# Patient Record
Sex: Female | Born: 1940 | Race: White | Hispanic: No | Smoking: Former smoker
Health system: Southern US, Community
[De-identification: ages and names within clinical notes are randomized; demographics above are authoritative.]

## PROBLEM LIST (undated history)

## (undated) DIAGNOSIS — F32A Depression, unspecified: Secondary | ICD-10-CM

## (undated) DIAGNOSIS — E119 Type 2 diabetes mellitus without complications: Secondary | ICD-10-CM

## (undated) DIAGNOSIS — K219 Gastro-esophageal reflux disease without esophagitis: Secondary | ICD-10-CM

## (undated) DIAGNOSIS — C189 Malignant neoplasm of colon, unspecified: Secondary | ICD-10-CM

## (undated) DIAGNOSIS — I10 Essential (primary) hypertension: Secondary | ICD-10-CM

## (undated) DIAGNOSIS — F329 Major depressive disorder, single episode, unspecified: Secondary | ICD-10-CM

## (undated) HISTORY — PX: ABDOMINAL HYSTERECTOMY: SHX81

## (undated) HISTORY — PX: CHOLECYSTECTOMY: SHX55

---

## 2005-09-21 ENCOUNTER — Emergency Department: Payer: Self-pay | Admitting: Emergency Medicine

## 2006-11-07 ENCOUNTER — Emergency Department (HOSPITAL_COMMUNITY): Admission: EM | Admit: 2006-11-07 | Discharge: 2006-11-07 | Payer: Self-pay | Admitting: Emergency Medicine

## 2006-12-24 ENCOUNTER — Emergency Department (HOSPITAL_COMMUNITY): Admission: EM | Admit: 2006-12-24 | Discharge: 2006-12-24 | Payer: Self-pay | Admitting: Emergency Medicine

## 2006-12-28 ENCOUNTER — Emergency Department: Payer: Self-pay

## 2009-05-20 ENCOUNTER — Emergency Department (HOSPITAL_COMMUNITY): Admission: EM | Admit: 2009-05-20 | Discharge: 2009-05-20 | Payer: Self-pay | Admitting: Emergency Medicine

## 2010-12-13 ENCOUNTER — Emergency Department (HOSPITAL_COMMUNITY)
Admission: EM | Admit: 2010-12-13 | Discharge: 2010-12-14 | Disposition: A | Discharge status: Home / Self Care | Payer: Self-pay | Attending: Emergency Medicine | Admitting: Emergency Medicine

## 2010-12-13 DIAGNOSIS — T07XXXA Unspecified multiple injuries, initial encounter: Secondary | ICD-10-CM | POA: Insufficient documentation

## 2010-12-13 DIAGNOSIS — M79609 Pain in unspecified limb: Secondary | ICD-10-CM | POA: Insufficient documentation

## 2010-12-13 DIAGNOSIS — I10 Essential (primary) hypertension: Secondary | ICD-10-CM | POA: Insufficient documentation

## 2010-12-13 DIAGNOSIS — E119 Type 2 diabetes mellitus without complications: Secondary | ICD-10-CM | POA: Insufficient documentation

## 2010-12-13 DIAGNOSIS — M25559 Pain in unspecified hip: Secondary | ICD-10-CM | POA: Insufficient documentation

## 2010-12-13 DIAGNOSIS — E669 Obesity, unspecified: Secondary | ICD-10-CM | POA: Insufficient documentation

## 2010-12-13 DIAGNOSIS — Y929 Unspecified place or not applicable: Secondary | ICD-10-CM | POA: Insufficient documentation

## 2012-09-21 ENCOUNTER — Emergency Department (HOSPITAL_COMMUNITY)
Admission: EM | Admit: 2012-09-21 | Discharge: 2012-09-21 | Disposition: A | Discharge status: Home / Self Care | Payer: Self-pay | Attending: Emergency Medicine | Admitting: Emergency Medicine

## 2012-09-21 ENCOUNTER — Emergency Department (HOSPITAL_COMMUNITY): Payer: Self-pay

## 2012-09-21 ENCOUNTER — Encounter (HOSPITAL_COMMUNITY): Payer: Self-pay | Admitting: *Deleted

## 2012-09-21 DIAGNOSIS — L0390 Cellulitis, unspecified: Secondary | ICD-10-CM

## 2012-09-21 DIAGNOSIS — L03039 Cellulitis of unspecified toe: Secondary | ICD-10-CM | POA: Insufficient documentation

## 2012-09-21 DIAGNOSIS — E119 Type 2 diabetes mellitus without complications: Secondary | ICD-10-CM | POA: Insufficient documentation

## 2012-09-21 DIAGNOSIS — I10 Essential (primary) hypertension: Secondary | ICD-10-CM | POA: Insufficient documentation

## 2012-09-21 DIAGNOSIS — L02619 Cutaneous abscess of unspecified foot: Secondary | ICD-10-CM | POA: Insufficient documentation

## 2012-09-21 HISTORY — DX: Essential (primary) hypertension: I10

## 2012-09-21 HISTORY — DX: Type 2 diabetes mellitus without complications: E11.9

## 2012-09-21 LAB — CBC WITH DIFFERENTIAL/PLATELET
Basophils Absolute: 0 10*3/uL (ref 0.0–0.1)
Basophils Relative: 1 % (ref 0–1)
Eosinophils Absolute: 0.2 10*3/uL (ref 0.0–0.7)
Eosinophils Relative: 2 % (ref 0–5)
Hemoglobin: 14 g/dL (ref 12.0–15.0)
Lymphocytes Relative: 23 % (ref 12–46)
MCHC: 34.1 g/dL (ref 30.0–36.0)
Monocytes Absolute: 0.5 10*3/uL (ref 0.1–1.0)
Monocytes Relative: 6 % (ref 3–12)
Neutro Abs: 6 10*3/uL (ref 1.7–7.7)
RDW: 12.9 % (ref 11.5–15.5)

## 2012-09-21 LAB — POCT I-STAT, CHEM 8
Creatinine, Ser: 1 mg/dL (ref 0.50–1.10)
Glucose, Bld: 138 mg/dL — ABNORMAL HIGH (ref 70–99)
HCT: 42 % (ref 36.0–46.0)
Hemoglobin: 14.3 g/dL (ref 12.0–15.0)
TCO2: 27 mmol/L (ref 0–100)

## 2012-09-21 MED ORDER — SULFAMETHOXAZOLE-TRIMETHOPRIM 800-160 MG PO TABS: 1.0000 | ORAL_TABLET | Freq: Two times a day (BID) | ORAL | Status: DC

## 2012-09-21 MED ORDER — OXYCODONE-ACETAMINOPHEN 5-325 MG PO TABS: 1.0000 | ORAL_TABLET | Freq: Once | ORAL | Status: DC

## 2012-09-21 MED ORDER — OXYCODONE-ACETAMINOPHEN 5-325 MG PO TABS
1.0000 | ORAL_TABLET | Freq: Once | ORAL | Status: AC
Start: 2012-09-21 — End: 2012-09-21
  Administered 2012-09-21: 1 via ORAL
  Filled 2012-09-21: qty 1

## 2012-09-21 NOTE — ED Notes (Addendum)
Patient states she has a place on her foot,  She has noted callouses to her left foot.  Patient has some darkness to the great toe, states the area looked like it had a blood blister and it bled.  There is redness noted to the great toe as well.  She now has sore developing between her 3rd and 4th toes.  The 4th toe is red.    She is a diabetic but states she is not taking any medications and does not monitor her sugar

## 2012-09-21 NOTE — ED Provider Notes (Signed)
I  reviewed the resident's note and I agree with the findings and plan.      Nelia Shi, MD 09/21/12 [phone]

## 2012-09-21 NOTE — ED Provider Notes (Addendum)
History     CSN: [id number]  Arrival date & time 09/21/12  4098   None     Chief Complaint  Patient presents with  . Toe Pain    (Consider location/radiation/quality/duration/timing/severity/associated sxs/prior treatment) HPI Comments: 72 y.o PMH DM (not on medication) presents with left 4th toe pain and ulceration x 2 weeks.  Pain is 10/10 burning throbbing left 4th toe.  Pain especially hurts at night.  Pain radiates to mid left foot.  Left great toe has a callus with previous blood blister that's dried.  She has tried peroxide, Ibuprofen and placing a bandaid to remove pressure on the area without relief.  She denies trauma or insect bite.    PCP: none pending Medicaid on Medicare SH: denies smoking since 1992   Patient is a 72 y.o. female presenting with toe pain. The history is provided by the patient. No language interpreter was used.  Toe Pain This is a new problem. The current episode started 1 to 4 weeks ago. The problem occurs daily. Pertinent negatives include no chest pain, chills or fever. Exacerbated by: pressure from other toe  She has tried NSAIDs (peroxide ) for the symptoms. The treatment provided no relief.    Past Medical History  Diagnosis Date  . Diabetes mellitus without complication   . Hypertension     Past Surgical History  Procedure Date  . Cholecystectomy   . Abdominal hysterectomy     No family history on file.  History  Substance Use Topics  . Smoking status: Never Smoker   . Smokeless tobacco: Not on file  . Alcohol Use: No    OB History    Grav Para Term Preterm Abortions TAB SAB Ect Mult Living                  Review of Systems  Constitutional: Negative for fever and chills.  Respiratory: Negative for shortness of breath.   Cardiovascular: Negative for chest pain.  Skin: Positive for wound.       Callus left great toe Wound to left 4th toe x 2 week denies h/o purulent drainage but has bleeding, pain    Allergies  Review  of patient's allergies indicates no known allergies.  Home Medications   Current Outpatient Rx  Name  Route  Sig  Dispense  Refill  . IBUPROFEN 200 MG PO TABS   Oral   Take 200 mg by mouth every 6 (six) hours as needed. pain         . OXYCODONE-ACETAMINOPHEN 5-325 MG PO TABS   Oral   Take 1 tablet by mouth once.   30 tablet   0   . SULFAMETHOXAZOLE-TRIMETHOPRIM 800-160 MG PO TABS   Oral   Take 1 tablet by mouth 2 (two) times daily. For 7 days. Do not take with alcohol. ANTIBIOTIC   14 tablet   0     BP 134/67  Pulse 64  Temp 97 F (36.1 C) (Oral)  Resp 18  Ht 5\' 11"  (1.803 m)  Wt 192 lb (87.091 kg)  BMI 26.78 kg/m2  SpO2 97%  Physical Exam  Nursing note and vitals reviewed. Constitutional: She is oriented to person, place, and time. She appears well-developed and well-nourished. She is cooperative. No distress.  HENT:  Head: Normocephalic and atraumatic.  Mouth/Throat: Oropharynx is clear and moist and mucous membranes are normal. She has dentures. No oropharyngeal exudate.  Eyes: Conjunctivae normal are normal. Pupils are equal, round, and reactive to light.  Right eye exhibits no discharge. Left eye exhibits no discharge. No scleral icterus.  Cardiovascular: Normal rate, regular rhythm, S1 normal, S2 normal, normal heart sounds and intact distal pulses.   No murmur heard. Pulmonary/Chest: Effort normal and breath sounds normal. No respiratory distress. She has no wheezes.  Abdominal: Soft. Bowel sounds are normal. She exhibits no distension. There is no tenderness.  Neurological: She is alert and oriented to person, place, and time.  Skin: Skin is warm and dry. She is not diaphoretic.     Psychiatric: She has a normal mood and affect. Her speech is normal and behavior is normal. Judgment and thought content normal. Cognition and memory are normal.    ED Course  Procedures (including critical care time)  Labs Reviewed  GLUCOSE, CAPILLARY - Abnormal; Notable  for the following:    Glucose-Capillary 131 (*)     All other components within normal limits  POCT I-STAT, CHEM 8 - Abnormal; Notable for the following:    Glucose, Bld 138 (*)     All other components within normal limits  SEDIMENTATION RATE  CBC WITH DIFFERENTIAL   Dg Foot Complete Left  09/21/2012  *RADIOLOGY REPORT*  Clinical Data: Pain and swelling of the first and fourth toes. Question osteomyelitis.  LEFT FOOT - COMPLETE 3+ VIEW  Comparison: None.  Findings: No bony destructive change to suggest osteomyelitis is identified.  There is no fracture or dislocation.  No soft tissue gas collection or radiopaque foreign body is seen.  Hallux valgus deformity is noted. Medial angulation of the fifth toe at the MTP joint is consistent with chronic change.  Calcaneal spurring is seen.  IMPRESSION:  1.  Negative for plain film evidence of osteomyelitis.  No acute finding. 2.  Hallux valgus.   Original Report Authenticated By: Holley Dexter, M.D.      1. Cellulitis       MDM  Left 4th toe ulceration with h/o DM (without medications) -Concern for cellulitis  -ESR, cbc, istat, left foot X ray to r/o osteomyelitis  -Antibiotics and pain control Percocet #30 no RF, Bactrim ds bid #14 no RF -RT to ED if not improving.   Shirlee Latch MD [phone]         Annett Gula, MD 09/21/12 1324  Annett Gula, MD 09/21/12 [phone]

## 2012-09-23 NOTE — ED Provider Notes (Signed)
I saw and evaluated the patient, reviewed the resident's note and I agree with the findings and plan.   .Face to face Exam:  General:  Awake HEENT:  Atraumatic Resp:  Normal effort Abd:  Nondistended Neuro:No focal weakness Lymph: No adenopathy   Nelia Shi, MD 09/23/12 1317

## 2013-03-07 ENCOUNTER — Emergency Department (HOSPITAL_COMMUNITY)
Admission: EM | Admit: 2013-03-07 | Discharge: 2013-03-07 | Disposition: A | Discharge status: Home / Self Care | Payer: Medicare Other | Attending: Emergency Medicine | Admitting: Emergency Medicine

## 2013-03-07 ENCOUNTER — Encounter (HOSPITAL_COMMUNITY): Payer: Self-pay | Admitting: Emergency Medicine

## 2013-03-07 ENCOUNTER — Emergency Department (HOSPITAL_COMMUNITY): Payer: Medicare Other

## 2013-03-07 DIAGNOSIS — S66912A Strain of unspecified muscle, fascia and tendon at wrist and hand level, left hand, initial encounter: Secondary | ICD-10-CM

## 2013-03-07 DIAGNOSIS — I10 Essential (primary) hypertension: Secondary | ICD-10-CM | POA: Insufficient documentation

## 2013-03-07 DIAGNOSIS — X500XXA Overexertion from strenuous movement or load, initial encounter: Secondary | ICD-10-CM | POA: Insufficient documentation

## 2013-03-07 DIAGNOSIS — W230XXA Caught, crushed, jammed, or pinched between moving objects, initial encounter: Secondary | ICD-10-CM | POA: Insufficient documentation

## 2013-03-07 DIAGNOSIS — E119 Type 2 diabetes mellitus without complications: Secondary | ICD-10-CM | POA: Insufficient documentation

## 2013-03-07 DIAGNOSIS — S59909A Unspecified injury of unspecified elbow, initial encounter: Secondary | ICD-10-CM | POA: Insufficient documentation

## 2013-03-07 DIAGNOSIS — Y92009 Unspecified place in unspecified non-institutional (private) residence as the place of occurrence of the external cause: Secondary | ICD-10-CM | POA: Insufficient documentation

## 2013-03-07 DIAGNOSIS — IMO0002 Reserved for concepts with insufficient information to code with codable children: Secondary | ICD-10-CM | POA: Insufficient documentation

## 2013-03-07 DIAGNOSIS — Y9389 Activity, other specified: Secondary | ICD-10-CM | POA: Insufficient documentation

## 2013-03-07 DIAGNOSIS — S6990XA Unspecified injury of unspecified wrist, hand and finger(s), initial encounter: Secondary | ICD-10-CM | POA: Insufficient documentation

## 2013-03-07 DIAGNOSIS — S60041A Contusion of right ring finger without damage to nail, initial encounter: Secondary | ICD-10-CM

## 2013-03-07 DIAGNOSIS — S6000XA Contusion of unspecified finger without damage to nail, initial encounter: Secondary | ICD-10-CM | POA: Insufficient documentation

## 2013-03-07 MED ORDER — HYDROCODONE-ACETAMINOPHEN 5-325 MG PO TABS: 1.0000 | ORAL_TABLET | ORAL | Status: DC | PRN

## 2013-03-07 MED ORDER — HYDROCODONE-ACETAMINOPHEN 5-325 MG PO TABS
1.0000 | ORAL_TABLET | Freq: Once | ORAL | Status: AC
  Administered 2013-03-07: 1 via ORAL
  Filled 2013-03-07: qty 1

## 2013-03-07 NOTE — ED Provider Notes (Signed)
History    This chart was scribed for non-physician practitioner Arnoldo Hooker PA-C working with Carleene Cooper III, MD by Donne Anon, ED Scribe. This patient was seen in room TR09C/TR09C and the patient's care was started at 1635.  CSN: [id number] Arrival date & time 03/07/13  1613  First MD Initiated Contact with Patient 03/07/13 1635     Chief Complaint  Patient presents with  . Hand Pain  . Wrist Pain    The history is provided by the patient. No language interpreter was used.   HPI Comments: Kathryn Knapp is a 72 y.o. female who presents to the Emergency Department complaining of several days of sudden onset, gradually worsening, constant left hand pain and swelling that began several days ago when she was opening a jar and radiates into her left index finger. She heard a "popping" noise at this time, and has had left hand pain ever since. Using her hand makes the pain worse She denies numbness or any other pain.  She also states that she smashed her right 4th finger several days ago in a different incident while she was moving furniture. She states at baseline she does not use her right hand due to arthritis. She reports mild increased pain.    Past Medical History  Diagnosis Date  . Diabetes mellitus without complication   . Hypertension    Past Surgical History  Procedure Laterality Date  . Cholecystectomy    . Abdominal hysterectomy     History reviewed. No pertinent family history. History  Substance Use Topics  . Smoking status: Never Smoker   . Smokeless tobacco: Not on file  . Alcohol Use: No   OB History   Grav Para Term Preterm Abortions TAB SAB Ect Mult Living                 Review of Systems  Musculoskeletal: Positive for joint swelling and arthralgias.  Skin: Positive for color change.  Neurological: Negative for numbness.  All other systems reviewed and are negative.    Allergies  Review of patient's allergies indicates no known  allergies.  Home Medications   Current Outpatient Rx  Name  Route  Sig  Dispense  Refill  . ibuprofen (ADVIL,MOTRIN) 200 MG tablet   Oral   Take 200 mg by mouth every 6 (six) hours as needed. pain         . oxyCODONE-acetaminophen (PERCOCET/ROXICET) 5-325 MG per tablet   Oral   Take 1 tablet by mouth once.   30 tablet   0   . sulfamethoxazole-trimethoprim (SEPTRA DS) 800-160 MG per tablet   Oral   Take 1 tablet by mouth 2 (two) times daily. For 7 days. Do not take with alcohol. ANTIBIOTIC   14 tablet   0    BP 174/76  Pulse 92  Temp(Src) 98.5 F (36.9 C) (Oral)  Resp 18  SpO2 98%  Physical Exam  Nursing note and vitals reviewed. Constitutional: She appears well-developed and well-nourished. No distress.  HENT:  Head: Normocephalic and atraumatic.  Eyes: Conjunctivae are normal.  Neck: Neck supple. No tracheal deviation present.  Cardiovascular: Normal rate.   Pulmonary/Chest: Effort normal. No respiratory distress.  Musculoskeletal: Normal range of motion.  Left wrist has full pain free ROM with out strength deficit. First digit has weak extension and normal flexion. No swelling, discoloration or bony deformity. Capillary refill intact. Right hand has chronic enlarged digital joints held in flexion, consistent with arthritis. 4th right  finger is ecchymotic distally without deformity. No subungual hematoma. Tender to any manipulation.   Neurological: She is alert.  Skin: Skin is warm and dry.  Psychiatric: She has a normal mood and affect. Her behavior is normal.    ED Course  Procedures (including critical care time) DIAGNOSTIC STUDIES: Oxygen Saturation is 98% on RA, normal by my interpretation.    COORDINATION OF CARE: 4:48 PM Discussed treatment plan which includes right hand xray and left wrist splint with pt at bedside and pt agreed to plan. Hand referral given.  Dg Finger Ring Right  03/07/2013   *RADIOLOGY REPORT*  Clinical Data: Hand pain.  Wrist pain.   Pain the distal tip of the ring finger.  Rheumatoid arthritis.  RIGHT RING FINGER 2+V  Comparison: None.  Findings: The proximal interphalangeal joints are flexed/contracted.  This limits evaluation of the distal phalanges. On the lateral view, there is question of a fracture of the base of the distal phalanx of the fourth digit.  No radiopaque foreign body or soft tissue gas identified.  IMPRESSION:  1.  Possible fracture of the base of the distal phalanx of the fourth digit. 2.  Contractures/flexion of the digits.   Original Report Authenticated By: Norva Pavlov, M.D.    Labs Reviewed - No data to display No results found. No diagnosis found. 1. Left hand strain 2. Right 4th digit contusion. MDM  Left hand pain likely ligamentous without bony tenderness or deformity and given mechanism of injury. Right finger injury with possible fracture but no displacement. Discussed hand referral to address both issues.   I personally performed the services described in this documentation, which was scribed in my presence. The recorded information has been reviewed and is accurate.     Arnoldo Hooker, PA-C 03/07/13 1757

## 2013-03-07 NOTE — Progress Notes (Signed)
Orthopedic Tech Progress Note Patient Details:  Kathryn Knapp 11/02/1940 [id number]  Ortho Devices Type of Ortho Device: Thumb velcro splint;Finger splint Ortho Device/Splint Location: thumb spica LUE-finger splint RUE Ortho Device/Splint Interventions: Ordered;Application   Jennye Moccasin 03/07/2013, 6:31 PM

## 2013-03-07 NOTE — ED Notes (Signed)
Pt c/o left wrist and hand pain after opening jar several days ago; pt sts smashed right 4th digit while moving; bruising noted

## 2013-03-08 ENCOUNTER — Emergency Department: Payer: Self-pay | Admitting: Emergency Medicine

## 2013-03-08 NOTE — ED Provider Notes (Signed)
Medical screening examination/treatment/procedure(s) were performed by non-physician practitioner and as supervising physician I was immediately available for consultation/collaboration.   Zarria Towell III, MD 03/08/13 1256 

## 2013-04-14 ENCOUNTER — Emergency Department (HOSPITAL_COMMUNITY)
Admission: EM | Admit: 2013-04-14 | Discharge: 2013-04-14 | Disposition: A | Discharge status: Home / Self Care | Payer: Self-pay | Attending: Emergency Medicine | Admitting: Emergency Medicine

## 2013-04-14 ENCOUNTER — Encounter (HOSPITAL_COMMUNITY): Payer: Self-pay | Admitting: Nurse Practitioner

## 2013-04-14 ENCOUNTER — Emergency Department (HOSPITAL_COMMUNITY): Payer: Self-pay

## 2013-04-14 DIAGNOSIS — L02619 Cutaneous abscess of unspecified foot: Secondary | ICD-10-CM | POA: Insufficient documentation

## 2013-04-14 DIAGNOSIS — E119 Type 2 diabetes mellitus without complications: Secondary | ICD-10-CM | POA: Insufficient documentation

## 2013-04-14 DIAGNOSIS — Y929 Unspecified place or not applicable: Secondary | ICD-10-CM | POA: Insufficient documentation

## 2013-04-14 DIAGNOSIS — M25561 Pain in right knee: Secondary | ICD-10-CM

## 2013-04-14 DIAGNOSIS — S8990XA Unspecified injury of unspecified lower leg, initial encounter: Secondary | ICD-10-CM | POA: Insufficient documentation

## 2013-04-14 DIAGNOSIS — W108XXA Fall (on) (from) other stairs and steps, initial encounter: Secondary | ICD-10-CM | POA: Insufficient documentation

## 2013-04-14 DIAGNOSIS — I10 Essential (primary) hypertension: Secondary | ICD-10-CM | POA: Insufficient documentation

## 2013-04-14 DIAGNOSIS — Y9301 Activity, walking, marching and hiking: Secondary | ICD-10-CM | POA: Insufficient documentation

## 2013-04-14 DIAGNOSIS — L03115 Cellulitis of right lower limb: Secondary | ICD-10-CM

## 2013-04-14 MED ORDER — HYDROCODONE-ACETAMINOPHEN 5-325 MG PO TABS: 1.0000 | ORAL_TABLET | Freq: Four times a day (QID) | ORAL | Status: DC | PRN

## 2013-04-14 MED ORDER — CLINDAMYCIN HCL 150 MG PO CAPS: 300.0000 mg | ORAL_CAPSULE | Freq: Four times a day (QID) | ORAL | Status: DC

## 2013-04-14 MED ORDER — CIPROFLOXACIN HCL 500 MG PO TABS: 500.0000 mg | ORAL_TABLET | Freq: Two times a day (BID) | ORAL | Status: DC

## 2013-04-14 NOTE — ED Notes (Signed)
Pt returned from xray

## 2013-04-14 NOTE — ED Provider Notes (Signed)
CSN: [id number]     Arrival date & time 04/14/13  1241 History   First MD Initiated Contact with Patient 04/14/13 1319     Chief Complaint  Patient presents with  . Leg Injury   (Consider location/radiation/quality/duration/timing/severity/associated sxs/prior Treatment) HPI Comments: Patient reports she mistepped yesterday while walking down the stairs, twisted her right knee and fell into the bushes.  States whenever she bends her knee she notes a sharp pain in her inside of her knee.  States it feels as if she has jammed her knee and if she were to pull it hard it would feel better.  States today she had to kneel down to fix her curtains and when she tried to stand up she could not fully straighten her right knee, had intense pain, and fell backwards.  Denies any injury from the fall - did not hit head, denies LOC.   Pt also has untreated diabetes since 2003-12-28, found to have tack in the bottom of her right shoe that pierced her right foot.  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  She noticed the tack in her shoe only because she found blood in the bottom of her shoe.  She is unsure how long the tack has been there.  Denies fevers, chills, recent illness.  States she has not been to a PCP since 28-Dec-2003 when her son died.   Pt notes she is sad but denies depression, denies SI.    The history is provided by the patient.    Past Medical History  Diagnosis Date  . Diabetes mellitus without complication   . Hypertension    Past Surgical History  Procedure Laterality Date  . Cholecystectomy    . Abdominal hysterectomy     History reviewed. No pertinent family history. History  Substance Use Topics  . Smoking status: Never Smoker   . Smokeless tobacco: Not on file  . Alcohol Use: No   OB History   Grav Para Term Preterm Abortions TAB SAB Ect Mult Living                 Review of Systems  Constitutional: Negative for fever and chills.  HENT: Negative  for sore throat.   Respiratory: Negative for cough and shortness of breath.   Cardiovascular: Negative for chest pain.  Gastrointestinal: Negative for nausea, vomiting and abdominal pain.  Musculoskeletal: Positive for arthralgias.  Skin: Positive for wound.  Neurological: Negative for syncope and headaches.    Allergies  Review of patient's allergies indicates no known allergies.  Home Medications   Current Outpatient Rx  Name  Route  Sig  Dispense  Refill  . acetaminophen (TYLENOL) 500 MG tablet   Oral   Take 1,000 mg by mouth daily as needed for pain. For pain         . ibuprofen (ADVIL,MOTRIN) 200 MG tablet   Oral   Take 400 mg by mouth daily as needed for pain. For pain          BP 139/80  Pulse 95  Temp(Src) 98 F (36.7 C) (Oral)  Resp 20  Ht 5\' 11"  (1.803 m)  Wt 198 lb (89.812 kg)  BMI 27.63 kg/m2  SpO2 98% Physical Exam  Nursing note and vitals reviewed. Constitutional: She appears well-developed and well-nourished. No distress.  HENT:  Head: Normocephalic and atraumatic.  Neck: Neck supple.  Pulmonary/Chest: Effort normal.  Musculoskeletal:       Right knee: She exhibits decreased  range of motion. She exhibits no swelling, no effusion, no ecchymosis, no deformity, no laceration, normal alignment, no LCL laxity and no MCL laxity. Tenderness found.  Right foot, plantar aspect with two apparent puncture wounds and small area of erythema and edema, "uncomfortable" when palpated.    Right knee without localized tenderness, no laxity of joint.  Sensation intact, distal pulses intact.  Decreased AROM secondary to pain.      Neurological: She is alert.  Skin: She is not diaphoretic.  The area of erythema over her plantar right foot is approximately 1x2cm  ED Course  Procedures (including critical care time) Labs Review Labs Reviewed  GLUCOSE, CAPILLARY - Abnormal; Notable for the following:    Glucose-Capillary 122 (*)    All other components within  normal limits   Imaging Review Dg Ankle Complete Right  04/14/2013   *RADIOLOGY REPORT*  Clinical Data: Right ankle pain  RIGHT ANKLE - COMPLETE 3+ VIEW  Comparison: None.  Findings: Well corticated bony densities are noted adjacent to the medial malleolus consistent with prior trauma.  No acute bony or soft tissue abnormality is seen.  A small plantar spur is noted.  IMPRESSION: Findings consistent with prior trauma.  No acute abnormality is seen.   Original Report Authenticated By: Alcide Clever, M.D.   Dg Knee Complete 4 Views Right  04/14/2013   *RADIOLOGY REPORT*  Clinical Data: Right knee pain following injury  RIGHT KNEE - COMPLETE 4+ VIEW  Comparison: None.  Findings: No acute fracture or dislocation is noted.  Degenerative changes involve the joint space.  Mild irregularity the posterior aspect of the patella is noted related to repetitive trauma.  IMPRESSION: Degenerative changes and findings suggestive of repetitive trauma to the posterior aspect of the patella.   Original Report Authenticated By: Alcide Clever, M.D.   Dg Foot Complete Right  04/14/2013   *RADIOLOGY REPORT*  Clinical Data: Recent traumatic injury with pain  RIGHT FOOT COMPLETE - 3+ VIEW  Comparison: None.  Findings: No acute fracture or dislocation is noted.  Hallux valgus deformity is seen.  There is also evidence of an os naviculare medially.  IMPRESSION: No acute abnormality is noted.   Original Report Authenticated By: Alcide Clever, M.D.   Discussed patient with Dr Freida Busman.  Discussed antibiotic coverage with Dr Freida Busman.   MDM   1. Right knee pain   2. Cellulitis of right foot    Patient with right knee injury yesterday as well as very small area of right foot cellulitis. Patient is a known diabetic and has been untreated for 9 years but has a blood sugar of 122 today.  Patient is pending Medicare approval and then will followup with her primary care provider. The right knee is stable and per history may be a meniscal  injury. Patient placed in a knee sleeve and given orthopedic followup. Patient also discharged home with antibiotics for right foot cellulitis and advised to have followup in 2 days for recheck. Patient is afebrile and nontoxic here.  Discussed all results with patient.  Pt given return precautions.  Pt verbalizes understanding and agrees with plan.      I doubt any other EMC precluding discharge at this time including, but not necessarily limited to the following: septic joint, osteomyelitis.    Trixie Dredge, PA-C 04/14/13 1542

## 2013-04-14 NOTE — ED Notes (Signed)
Pt states she missed a step and twisted R  ankle yesterday, also stepped on 2 tacks that went through the shoe and punctured bottom of her R foot. C/o R ankle and R knee pain since.

## 2013-04-18 NOTE — ED Provider Notes (Signed)
Medical screening examination/treatment/procedure(s) were performed by non-physician practitioner and as supervising physician I was immediately available for consultation/collaboration.  Boen Sterbenz T Segundo Makela, MD 04/18/13 2059 

## 2014-05-31 ENCOUNTER — Encounter (HOSPITAL_COMMUNITY): Payer: Self-pay | Admitting: Emergency Medicine

## 2014-05-31 ENCOUNTER — Emergency Department (HOSPITAL_COMMUNITY)
Admission: EM | Admit: 2014-05-31 | Discharge: 2014-05-31 | Disposition: A | Discharge status: Home / Self Care | Payer: Medicare Other | Attending: Emergency Medicine | Admitting: Emergency Medicine

## 2014-05-31 ENCOUNTER — Emergency Department (HOSPITAL_COMMUNITY): Payer: Medicare Other

## 2014-05-31 DIAGNOSIS — Y9389 Activity, other specified: Secondary | ICD-10-CM | POA: Insufficient documentation

## 2014-05-31 DIAGNOSIS — Y9289 Other specified places as the place of occurrence of the external cause: Secondary | ICD-10-CM | POA: Insufficient documentation

## 2014-05-31 DIAGNOSIS — W230XXA Caught, crushed, jammed, or pinched between moving objects, initial encounter: Secondary | ICD-10-CM | POA: Insufficient documentation

## 2014-05-31 DIAGNOSIS — Z23 Encounter for immunization: Secondary | ICD-10-CM | POA: Insufficient documentation

## 2014-05-31 DIAGNOSIS — I10 Essential (primary) hypertension: Secondary | ICD-10-CM | POA: Insufficient documentation

## 2014-05-31 DIAGNOSIS — S60221A Contusion of right hand, initial encounter: Secondary | ICD-10-CM | POA: Insufficient documentation

## 2014-05-31 DIAGNOSIS — E119 Type 2 diabetes mellitus without complications: Secondary | ICD-10-CM | POA: Insufficient documentation

## 2014-05-31 MED ORDER — TETANUS-DIPHTH-ACELL PERTUSSIS 5-2.5-18.5 LF-MCG/0.5 IM SUSP
0.5000 mL | Freq: Once | INTRAMUSCULAR | Status: AC
  Administered 2014-05-31: 0.5 mL via INTRAMUSCULAR
  Filled 2014-05-31: qty 0.5

## 2014-05-31 MED ORDER — HYDROCODONE-ACETAMINOPHEN 5-325 MG PO TABS: 1.0000 | ORAL_TABLET | Freq: Four times a day (QID) | ORAL | Status: DC | PRN

## 2014-05-31 MED ORDER — IBUPROFEN 800 MG PO TABS: 800.0000 mg | ORAL_TABLET | Freq: Three times a day (TID) | ORAL | Status: DC | PRN

## 2014-05-31 NOTE — ED Notes (Signed)
Pt reports slamming right hand in car door this am. Small skin tears noted, pt unsure of last tetanus. No acute distress noted.

## 2014-05-31 NOTE — ED Provider Notes (Signed)
CSN: [id number]     Arrival date & time 05/31/14  1001 History  This chart was scribed for non-physician practitioner, Irena Cords, PA-C working with Evelina Bucy, MD by Frederich Balding, ED scribe. This patient was seen in room TR06C/TR06C and the patient's care was started at 11:07 AM.   Chief Complaint  Patient presents with  . Hand Injury   The history is provided by the patient. No language interpreter was used.   HPI Comments: Kathryn Knapp is a 73 y.o. female who presents to the Emergency Department complaining of right hand injury that occurred earlier this morning. States her grandchild accidentally closed the door on her hand but she was able to just get hit with the door before her hand was slammed. Reports sudden onset pain with associated swelling and abrasions. Pt is unsure of when her last tetanus.   Past Medical History  Diagnosis Date  . Diabetes mellitus without complication   . Hypertension    Past Surgical History  Procedure Laterality Date  . Cholecystectomy    . Abdominal hysterectomy     History reviewed. No pertinent family history. History  Substance Use Topics  . Smoking status: Never Smoker   . Smokeless tobacco: Not on file  . Alcohol Use: No   OB History   Grav Para Term Preterm Abortions TAB SAB Ect Mult Living                 Review of Systems All other systems negative except as documented in the HPI. All pertinent positives and negatives as reviewed in the HPI.  Allergies  Review of patient's allergies indicates no known allergies.  Home Medications   Prior to Admission medications   Not on File   BP 164/80  Pulse 72  Temp(Src) 97.7 F (36.5 C) (Oral)  Resp 17  SpO2 99%  Physical Exam  Nursing note and vitals reviewed. Constitutional: She is oriented to person, place, and time. She appears well-developed and well-nourished. No distress.  HENT:  Head: Normocephalic and atraumatic.  Eyes: Conjunctivae and EOM are normal.  Neck:  Neck supple. No tracheal deviation present.  Cardiovascular: Normal rate.   Pulmonary/Chest: Effort normal. No respiratory distress.  Musculoskeletal: Normal range of motion.  Two superficial laceration over dorsum of right hand. Tenderness over MCP of second and third digits. ROM limited due to previous contracture of right hand.   Neurological: She is alert and oriented to person, place, and time.  Skin: Skin is warm and dry.  Psychiatric: She has a normal mood and affect. Her behavior is normal.    ED Course  Procedures (including critical care time)  DIAGNOSTIC STUDIES: Oxygen Saturation is 99% on RA, normal by my interpretation.    COORDINATION OF CARE: 11:07 AM-Discussed treatment plan which includes xray and updating tetanus with pt at bedside and pt agreed to plan.   Labs Review Labs Reviewed - No data to display  Imaging Review Dg Hand Complete Right  05/31/2014   CLINICAL DATA:  73 year old female status post blunt trauma to the hand when it was caught in a car door. Lacerations and pain. Initial encounter. Current history of contractures of the second through fifth digits.  EXAM: RIGHT HAND - COMPLETE 3+ VIEW  COMPARISON:  03/10/2013.  FINDINGS: Chronic osteopenia. Distal radius and ulna appear stable and intact. Stable carpal bone alignment. Metacarpals appear stable and intact. Phalanges appear intact, of the base of the right fourth distal phalanx appears healed.  IMPRESSION: No  acute fracture or dislocation identified about the right hand. Osteopenia. Follow-up films are recommended if symptoms persist.   Electronically Signed   By: Lars Pinks M.D.   On: 05/31/2014 11:03     I personally performed the services described in this documentation, which was scribed in my presence. The recorded information has been reviewed and is accurate.  Brent General, PA-C 06/01/14 1553

## 2014-05-31 NOTE — Discharge Instructions (Signed)
Return  Here as needed. Follow up with your doctor.Ice and elevate the hand. The x-rays did not show any broken bones

## 2014-06-02 NOTE — ED Provider Notes (Signed)
Medical screening examination/treatment/procedure(s) were performed by non-physician practitioner and as supervising physician I was immediately available for consultation/collaboration.   EKG Interpretation None        Ulani Degrasse, MD 06/02/14 0704 

## 2014-12-16 ENCOUNTER — Inpatient Hospital Stay (HOSPITAL_COMMUNITY)
Admission: EM | Admit: 2014-12-16 | Discharge: 2015-01-01 | DRG: 853 | Disposition: A | Discharge status: Rehabilitation Facility | Payer: Medicare Other | Attending: Oncology | Admitting: Oncology

## 2014-12-16 ENCOUNTER — Emergency Department (HOSPITAL_COMMUNITY): Payer: Medicare Other

## 2014-12-16 ENCOUNTER — Encounter (HOSPITAL_COMMUNITY): Payer: Self-pay | Admitting: *Deleted

## 2014-12-16 DIAGNOSIS — Z66 Do not resuscitate: Secondary | ICD-10-CM | POA: Diagnosis present

## 2014-12-16 DIAGNOSIS — F329 Major depressive disorder, single episode, unspecified: Secondary | ICD-10-CM | POA: Diagnosis present

## 2014-12-16 DIAGNOSIS — Z9889 Other specified postprocedural states: Secondary | ICD-10-CM | POA: Diagnosis not present

## 2014-12-16 DIAGNOSIS — R64 Cachexia: Secondary | ICD-10-CM | POA: Diagnosis present

## 2014-12-16 DIAGNOSIS — E876 Hypokalemia: Secondary | ICD-10-CM | POA: Diagnosis not present

## 2014-12-16 DIAGNOSIS — E279 Disorder of adrenal gland, unspecified: Secondary | ICD-10-CM | POA: Diagnosis present

## 2014-12-16 DIAGNOSIS — K352 Acute appendicitis with generalized peritonitis: Secondary | ICD-10-CM | POA: Diagnosis present

## 2014-12-16 DIAGNOSIS — Z452 Encounter for adjustment and management of vascular access device: Secondary | ICD-10-CM

## 2014-12-16 DIAGNOSIS — R5381 Other malaise: Secondary | ICD-10-CM | POA: Diagnosis not present

## 2014-12-16 DIAGNOSIS — R1030 Lower abdominal pain, unspecified: Secondary | ICD-10-CM | POA: Diagnosis present

## 2014-12-16 DIAGNOSIS — N179 Acute kidney failure, unspecified: Secondary | ICD-10-CM | POA: Diagnosis not present

## 2014-12-16 DIAGNOSIS — J9601 Acute respiratory failure with hypoxia: Secondary | ICD-10-CM | POA: Diagnosis not present

## 2014-12-16 DIAGNOSIS — R7881 Bacteremia: Secondary | ICD-10-CM | POA: Diagnosis not present

## 2014-12-16 DIAGNOSIS — Z978 Presence of other specified devices: Secondary | ICD-10-CM

## 2014-12-16 DIAGNOSIS — R109 Unspecified abdominal pain: Secondary | ICD-10-CM | POA: Diagnosis not present

## 2014-12-16 DIAGNOSIS — I8290 Acute embolism and thrombosis of unspecified vein: Secondary | ICD-10-CM | POA: Diagnosis present

## 2014-12-16 DIAGNOSIS — I10 Essential (primary) hypertension: Secondary | ICD-10-CM | POA: Diagnosis not present

## 2014-12-16 DIAGNOSIS — J9690 Respiratory failure, unspecified, unspecified whether with hypoxia or hypercapnia: Secondary | ICD-10-CM

## 2014-12-16 DIAGNOSIS — R6521 Severe sepsis with septic shock: Secondary | ICD-10-CM | POA: Diagnosis present

## 2014-12-16 DIAGNOSIS — I4891 Unspecified atrial fibrillation: Secondary | ICD-10-CM | POA: Diagnosis not present

## 2014-12-16 DIAGNOSIS — Z6825 Body mass index (BMI) 25.0-25.9, adult: Secondary | ICD-10-CM | POA: Diagnosis not present

## 2014-12-16 DIAGNOSIS — K921 Melena: Secondary | ICD-10-CM | POA: Diagnosis present

## 2014-12-16 DIAGNOSIS — K5649 Other impaction of intestine: Secondary | ICD-10-CM | POA: Diagnosis not present

## 2014-12-16 DIAGNOSIS — I749 Embolism and thrombosis of unspecified artery: Secondary | ICD-10-CM | POA: Diagnosis present

## 2014-12-16 DIAGNOSIS — A419 Sepsis, unspecified organism: Secondary | ICD-10-CM | POA: Diagnosis not present

## 2014-12-16 DIAGNOSIS — E119 Type 2 diabetes mellitus without complications: Secondary | ICD-10-CM | POA: Diagnosis present

## 2014-12-16 DIAGNOSIS — E785 Hyperlipidemia, unspecified: Secondary | ICD-10-CM | POA: Diagnosis present

## 2014-12-16 DIAGNOSIS — Z515 Encounter for palliative care: Secondary | ICD-10-CM | POA: Insufficient documentation

## 2014-12-16 DIAGNOSIS — K659 Peritonitis, unspecified: Secondary | ICD-10-CM | POA: Diagnosis present

## 2014-12-16 DIAGNOSIS — M25569 Pain in unspecified knee: Secondary | ICD-10-CM | POA: Insufficient documentation

## 2014-12-16 DIAGNOSIS — Z9119 Patient's noncompliance with other medical treatment and regimen: Secondary | ICD-10-CM | POA: Diagnosis present

## 2014-12-16 DIAGNOSIS — Z7901 Long term (current) use of anticoagulants: Secondary | ICD-10-CM | POA: Diagnosis not present

## 2014-12-16 DIAGNOSIS — C189 Malignant neoplasm of colon, unspecified: Secondary | ICD-10-CM | POA: Diagnosis not present

## 2014-12-16 DIAGNOSIS — F32A Depression, unspecified: Secondary | ICD-10-CM | POA: Diagnosis present

## 2014-12-16 DIAGNOSIS — Z89511 Acquired absence of right leg below knee: Secondary | ICD-10-CM | POA: Diagnosis not present

## 2014-12-16 DIAGNOSIS — M25561 Pain in right knee: Secondary | ICD-10-CM | POA: Diagnosis not present

## 2014-12-16 DIAGNOSIS — D638 Anemia in other chronic diseases classified elsewhere: Secondary | ICD-10-CM | POA: Diagnosis present

## 2014-12-16 DIAGNOSIS — K6389 Other specified diseases of intestine: Secondary | ICD-10-CM | POA: Diagnosis present

## 2014-12-16 DIAGNOSIS — K631 Perforation of intestine (nontraumatic): Secondary | ICD-10-CM | POA: Diagnosis present

## 2014-12-16 DIAGNOSIS — I313 Pericardial effusion (noninflammatory): Secondary | ICD-10-CM | POA: Diagnosis present

## 2014-12-16 DIAGNOSIS — G546 Phantom limb syndrome with pain: Secondary | ICD-10-CM | POA: Diagnosis not present

## 2014-12-16 DIAGNOSIS — K56609 Unspecified intestinal obstruction, unspecified as to partial versus complete obstruction: Secondary | ICD-10-CM | POA: Diagnosis present

## 2014-12-16 DIAGNOSIS — C185 Malignant neoplasm of splenic flexure: Secondary | ICD-10-CM | POA: Diagnosis present

## 2014-12-16 DIAGNOSIS — Z791 Long term (current) use of non-steroidal anti-inflammatories (NSAID): Secondary | ICD-10-CM | POA: Diagnosis not present

## 2014-12-16 DIAGNOSIS — Z9114 Patient's other noncompliance with medication regimen: Secondary | ICD-10-CM | POA: Diagnosis present

## 2014-12-16 DIAGNOSIS — C786 Secondary malignant neoplasm of retroperitoneum and peritoneum: Secondary | ICD-10-CM | POA: Diagnosis present

## 2014-12-16 DIAGNOSIS — E872 Acidosis: Secondary | ICD-10-CM | POA: Diagnosis not present

## 2014-12-16 DIAGNOSIS — Z933 Colostomy status: Secondary | ICD-10-CM | POA: Diagnosis not present

## 2014-12-16 DIAGNOSIS — C184 Malignant neoplasm of transverse colon: Secondary | ICD-10-CM | POA: Diagnosis not present

## 2014-12-16 DIAGNOSIS — D62 Acute posthemorrhagic anemia: Secondary | ICD-10-CM | POA: Diagnosis not present

## 2014-12-16 DIAGNOSIS — C772 Secondary and unspecified malignant neoplasm of intra-abdominal lymph nodes: Secondary | ICD-10-CM | POA: Diagnosis present

## 2014-12-16 DIAGNOSIS — C7972 Secondary malignant neoplasm of left adrenal gland: Secondary | ICD-10-CM | POA: Diagnosis not present

## 2014-12-16 DIAGNOSIS — I998 Other disorder of circulatory system: Secondary | ICD-10-CM | POA: Diagnosis not present

## 2014-12-16 DIAGNOSIS — M79609 Pain in unspecified limb: Secondary | ICD-10-CM | POA: Diagnosis not present

## 2014-12-16 DIAGNOSIS — M79671 Pain in right foot: Secondary | ICD-10-CM | POA: Diagnosis not present

## 2014-12-16 DIAGNOSIS — Z89519 Acquired absence of unspecified leg below knee: Secondary | ICD-10-CM | POA: Insufficient documentation

## 2014-12-16 DIAGNOSIS — I70261 Atherosclerosis of native arteries of extremities with gangrene, right leg: Secondary | ICD-10-CM | POA: Diagnosis present

## 2014-12-16 DIAGNOSIS — Z9049 Acquired absence of other specified parts of digestive tract: Secondary | ICD-10-CM

## 2014-12-16 DIAGNOSIS — S88111S Complete traumatic amputation at level between knee and ankle, right lower leg, sequela: Secondary | ICD-10-CM | POA: Diagnosis not present

## 2014-12-16 DIAGNOSIS — R4182 Altered mental status, unspecified: Secondary | ICD-10-CM | POA: Diagnosis not present

## 2014-12-16 DIAGNOSIS — K5660 Unspecified intestinal obstruction: Secondary | ICD-10-CM | POA: Diagnosis not present

## 2014-12-16 LAB — PROTIME-INR
INR: 1.19 (ref 0.00–1.49)
Prothrombin Time: 15.2 seconds (ref 11.6–15.2)

## 2014-12-16 LAB — CBC WITH DIFFERENTIAL/PLATELET
BASOS ABS: 0 10*3/uL (ref 0.0–0.1)
BASOS PCT: 0 % (ref 0–1)
Eosinophils Absolute: 0 10*3/uL (ref 0.0–0.7)
Eosinophils Relative: 0 % (ref 0–5)
HCT: 31.3 % — ABNORMAL LOW (ref 36.0–46.0)
Hemoglobin: 9.7 g/dL — ABNORMAL LOW (ref 12.0–15.0)
Lymphocytes Relative: 4 % — ABNORMAL LOW (ref 12–46)
Lymphs Abs: 0.5 10*3/uL — ABNORMAL LOW (ref 0.7–4.0)
MCH: 24.4 pg — AB (ref 26.0–34.0)
MCHC: 31 g/dL (ref 30.0–36.0)
MCV: 78.8 fL (ref 78.0–100.0)
MONOS PCT: 3 % (ref 3–12)
Monocytes Absolute: 0.4 10*3/uL (ref 0.1–1.0)
NEUTROS PCT: 93 % — AB (ref 43–77)
Neutro Abs: 13.5 10*3/uL — ABNORMAL HIGH (ref 1.7–7.7)
Platelets: 465 10*3/uL — ABNORMAL HIGH (ref 150–400)
RBC: 3.97 MIL/uL (ref 3.87–5.11)
RDW: 15.5 % (ref 11.5–15.5)
WBC: 14.5 10*3/uL — ABNORMAL HIGH (ref 4.0–10.5)

## 2014-12-16 LAB — COMPREHENSIVE METABOLIC PANEL
ALBUMIN: 2.7 g/dL — AB (ref 3.5–5.2)
ALK PHOS: 89 U/L (ref 39–117)
ALT: 14 U/L (ref 0–35)
AST: 26 U/L (ref 0–37)
Anion gap: 11 (ref 5–15)
BILIRUBIN TOTAL: 1.1 mg/dL (ref 0.3–1.2)
BUN: 15 mg/dL (ref 6–23)
CHLORIDE: 98 mmol/L (ref 96–112)
CO2: 26 mmol/L (ref 19–32)
Calcium: 8.4 mg/dL (ref 8.4–10.5)
Creatinine, Ser: 0.89 mg/dL (ref 0.50–1.10)
GFR calc Af Amer: 73 mL/min — ABNORMAL LOW (ref >90)
GFR calc non Af Amer: 63 mL/min — ABNORMAL LOW (ref >90)
Glucose, Bld: 144 mg/dL — ABNORMAL HIGH (ref 70–99)
POTASSIUM: 2.3 mmol/L — AB (ref 3.5–5.1)
Sodium: 135 mmol/L (ref 135–145)
TOTAL PROTEIN: 6.3 g/dL (ref 6.0–8.3)

## 2014-12-16 LAB — RETICULOCYTES
RBC.: 4 MIL/uL (ref 3.87–5.11)
Retic Count, Absolute: 48 10*3/uL (ref 19.0–186.0)
Retic Ct Pct: 1.2 % (ref 0.4–3.1)

## 2014-12-16 LAB — BASIC METABOLIC PANEL
Anion gap: 16 — ABNORMAL HIGH (ref 5–15)
BUN: 15 mg/dL (ref 6–23)
CHLORIDE: 100 mmol/L (ref 96–112)
CO2: 21 mmol/L (ref 19–32)
Calcium: 8.1 mg/dL — ABNORMAL LOW (ref 8.4–10.5)
Creatinine, Ser: 0.99 mg/dL (ref 0.50–1.10)
GFR, EST AFRICAN AMERICAN: 64 mL/min — AB (ref >90)
GFR, EST NON AFRICAN AMERICAN: 55 mL/min — AB (ref >90)
Glucose, Bld: 196 mg/dL — ABNORMAL HIGH (ref 70–99)
POTASSIUM: 2.8 mmol/L — AB (ref 3.5–5.1)
Sodium: 137 mmol/L (ref 135–145)

## 2014-12-16 LAB — GLUCOSE, CAPILLARY
GLUCOSE-CAPILLARY: 183 mg/dL — AB (ref 70–99)
Glucose-Capillary: 163 mg/dL — ABNORMAL HIGH (ref 70–99)

## 2014-12-16 LAB — MAGNESIUM: Magnesium: 1.8 mg/dL (ref 1.5–2.5)

## 2014-12-16 LAB — LIPASE, BLOOD: Lipase: 15 U/L (ref 11–59)

## 2014-12-16 MED ORDER — HYDROMORPHONE HCL 1 MG/ML IJ SOLN
1.0000 mg | INTRAMUSCULAR | Status: DC | PRN
  Administered 2014-12-16 – 2014-12-17 (×3): 1 mg via INTRAVENOUS
  Filled 2014-12-16 (×3): qty 1

## 2014-12-16 MED ORDER — HEPARIN SODIUM (PORCINE) 5000 UNIT/ML IJ SOLN
5000.0000 [IU] | Freq: Three times a day (TID) | INTRAMUSCULAR | Status: DC
  Administered 2014-12-16 (×2): 5000 [IU] via SUBCUTANEOUS
  Filled 2014-12-16 (×2): qty 1

## 2014-12-16 MED ORDER — IOHEXOL 300 MG/ML  SOLN
100.0000 mL | Freq: Once | INTRAMUSCULAR | Status: AC | PRN
  Administered 2014-12-16: 100 mL via INTRAVENOUS

## 2014-12-16 MED ORDER — ONDANSETRON HCL 4 MG/2ML IJ SOLN
4.0000 mg | Freq: Once | INTRAMUSCULAR | Status: AC
  Administered 2014-12-16: 4 mg via INTRAVENOUS
  Filled 2014-12-16: qty 2

## 2014-12-16 MED ORDER — HYDROMORPHONE HCL 1 MG/ML IJ SOLN
1.0000 mg | Freq: Once | INTRAMUSCULAR | Status: AC
  Administered 2014-12-16: 1 mg via INTRAVENOUS
  Filled 2014-12-16: qty 1

## 2014-12-16 MED ORDER — MORPHINE SULFATE 4 MG/ML IJ SOLN
4.0000 mg | Freq: Once | INTRAMUSCULAR | Status: DC
  Filled 2014-12-16: qty 1

## 2014-12-16 MED ORDER — POTASSIUM CHLORIDE 10 MEQ/100ML IV SOLN
10.0000 meq | INTRAVENOUS | Status: AC
  Administered 2014-12-16 – 2014-12-17 (×6): 10 meq via INTRAVENOUS
  Filled 2014-12-16 (×7): qty 100

## 2014-12-16 MED ORDER — SODIUM CHLORIDE 0.9 % IJ SOLN
3.0000 mL | Freq: Two times a day (BID) | INTRAMUSCULAR | Status: DC
  Administered 2014-12-16: 3 mL via INTRAVENOUS

## 2014-12-16 MED ORDER — SODIUM CHLORIDE 0.9 % IV SOLN
INTRAVENOUS | Status: DC
  Administered 2014-12-16: 10 mL/h via INTRAVENOUS
  Administered 2014-12-17 – 2014-12-23 (×2): via INTRAVENOUS
  Administered 2014-12-29: 20 mL/h via INTRAVENOUS

## 2014-12-16 MED ORDER — METOCLOPRAMIDE HCL 5 MG/ML IJ SOLN
10.0000 mg | Freq: Once | INTRAMUSCULAR | Status: AC
  Administered 2014-12-16: 10 mg via INTRAVENOUS
  Filled 2014-12-16: qty 2

## 2014-12-16 MED ORDER — ONDANSETRON HCL 4 MG PO TABS: 4.0000 mg | ORAL_TABLET | ORAL | Status: DC

## 2014-12-16 MED ORDER — INSULIN ASPART 100 UNIT/ML ~~LOC~~ SOLN
0.0000 [IU] | SUBCUTANEOUS | Status: DC
  Administered 2014-12-16 (×2): 2 [IU] via SUBCUTANEOUS

## 2014-12-16 MED ORDER — ONDANSETRON HCL 4 MG/2ML IJ SOLN
4.0000 mg | INTRAMUSCULAR | Status: DC
  Administered 2014-12-16 – 2014-12-17 (×3): 4 mg via INTRAVENOUS
  Filled 2014-12-16 (×3): qty 2

## 2014-12-16 MED ORDER — ONDANSETRON HCL 4 MG/2ML IJ SOLN
4.0000 mg | Freq: Once | INTRAMUSCULAR | Status: AC
Start: 2014-12-16 — End: 2014-12-16
  Administered 2014-12-16: 4 mg via INTRAVENOUS
  Filled 2014-12-16: qty 2

## 2014-12-16 MED ORDER — POTASSIUM CHLORIDE CRYS ER 20 MEQ PO TBCR
40.0000 meq | EXTENDED_RELEASE_TABLET | Freq: Once | ORAL | Status: AC
  Administered 2014-12-16: 40 meq via ORAL
  Filled 2014-12-16: qty 2

## 2014-12-16 MED ORDER — SODIUM CHLORIDE 0.9 % IV BOLUS (SEPSIS)
1000.0000 mL | Freq: Once | INTRAVENOUS | Status: AC
  Administered 2014-12-16: 1000 mL via INTRAVENOUS

## 2014-12-16 MED ORDER — SODIUM CHLORIDE 0.9 % IV SOLN
INTRAVENOUS | Status: DC
  Administered 2014-12-16 (×2): via INTRAVENOUS

## 2014-12-16 NOTE — ED Notes (Signed)
Attempted report 

## 2014-12-16 NOTE — ED Notes (Signed)
Patient transported to CT 

## 2014-12-16 NOTE — ED Notes (Signed)
MD at bedside. 

## 2014-12-16 NOTE — ED Provider Notes (Signed)
CSN: [id number]     Arrival date & time 12/16/14  0944 History   First MD Initiated Contact with Patient 12/16/14 [phone]     Chief Complaint  Patient presents with  . Abdominal Pain     (Consider location/radiation/quality/duration/timing/severity/associated sxs/prior Treatment) Patient is a 74 y.o. female presenting with abdominal pain.  Abdominal Pain Pain location:  Suprapubic Pain quality: sharp   Pain radiates to:  Does not radiate Pain severity:  Severe Onset quality:  Gradual Duration:  2 weeks Timing:  Constant Progression:  Worsening Chronicity:  New Context comment:  Initally started when she was at her grandson's birthday party, she was vomiting at that time, but not since. Relieved by: lifting her pannus. Worsened by:  Nothing tried Associated symptoms: nausea and vomiting   Associated symptoms: no diarrhea, no dysuria, no fever and no vaginal bleeding     Past Medical History  Diagnosis Date  . Diabetes mellitus without complication   . Hypertension    Past Surgical History  Procedure Laterality Date  . Cholecystectomy    . Abdominal hysterectomy     No family history on file. History  Substance Use Topics  . Smoking status: Never Smoker   . Smokeless tobacco: Not on file  . Alcohol Use: No   OB History    No data available     Review of Systems  Constitutional: Negative for fever.  Gastrointestinal: Positive for nausea, vomiting and abdominal pain. Negative for diarrhea.  Genitourinary: Negative for dysuria and vaginal bleeding.  All other systems reviewed and are negative.     Allergies  Review of patient's allergies indicates no known allergies.  Home Medications   Prior to Admission medications   Medication Sig Start Date End Date Taking? Authorizing Provider  HYDROcodone-acetaminophen (NORCO/VICODIN) 5-325 MG per tablet Take 1 tablet by mouth every 6 (six) hours as needed for moderate pain. 05/31/14   Dalia Heading, PA-C   ibuprofen (ADVIL,MOTRIN) 800 MG tablet Take 1 tablet (800 mg total) by mouth every 8 (eight) hours as needed. 05/31/14   Christopher Lawyer, PA-C   BP 118/53 mmHg  Pulse 103  Temp(Src) 97.3 F (36.3 C) (Oral)  Resp 16  SpO2 100% Physical Exam  Constitutional: She is oriented to person, place, and time. She appears well-developed and well-nourished. No distress.  HENT:  Head: Normocephalic and atraumatic.  Mouth/Throat: Oropharynx is clear and moist.  Eyes: Conjunctivae are normal. Pupils are equal, round, and reactive to light. No scleral icterus.  Neck: Neck supple.  Cardiovascular: Normal rate, regular rhythm, normal heart sounds and intact distal pulses.   No murmur heard. Pulmonary/Chest: Effort normal and breath sounds normal. No stridor. No respiratory distress. She has no rales.  Abdominal: Soft. Bowel sounds are normal. She exhibits distension (mild). There is generalized tenderness. There is no rigidity, no rebound and no guarding.  Musculoskeletal: Normal range of motion.  Neurological: She is alert and oriented to person, place, and time.  Skin: Skin is warm and dry. No rash noted.  Psychiatric: She has a normal mood and affect. Her behavior is normal.  Nursing note and vitals reviewed.   ED Course  Procedures (including critical care time) Labs Review Labs Reviewed  CBC WITH DIFFERENTIAL/PLATELET - Abnormal; Notable for the following:    WBC 14.5 (*)    Hemoglobin 9.7 (*)    HCT 31.3 (*)    MCH 24.4 (*)    Platelets 465 (*)    Neutrophils Relative % 93 (*)  Neutro Abs 13.5 (*)    Lymphocytes Relative 4 (*)    Lymphs Abs 0.5 (*)    All other components within normal limits  COMPREHENSIVE METABOLIC PANEL - Abnormal; Notable for the following:    Potassium 2.3 (*)    Glucose, Bld 144 (*)    Albumin 2.7 (*)    GFR calc non Af Amer 63 (*)    GFR calc Af Amer 73 (*)    All other components within normal limits  LIPASE, BLOOD  URINALYSIS, ROUTINE W REFLEX  MICROSCOPIC    Imaging Review  Ct Abdomen Pelvis W Contrast  12/16/2014   CLINICAL DATA:  Mid/lower abdominal pain x2 weeks, nausea/vomiting, diarrhea  EXAM: CT ABDOMEN AND PELVIS WITH CONTRAST  TECHNIQUE: Multidetector CT imaging of the abdomen and pelvis was performed using the standard protocol following bolus administration of intravenous contrast.  CONTRAST:  144mL OMNIPAQUE IOHEXOL 300 MG/ML  SOLN  COMPARISON:  100 mL Omnipaque 300 IV  FINDINGS: Lower chest: Mild dependent atelectasis at the lung bases. Trace left pleural effusion.  Small pericardial effusion. Coronary atherosclerosis. Atherosclerotic calcifications of the aortic arch/bruit.  Coronary atherosclerosis in the distal esophagus, suggesting gastroesophageal reflux or esophageal dysmotility.  Hepatobiliary: Liver is within normal limits. No suspicious/ enhancing hepatic lesions.  Status post cholecystectomy. No intrahepatic ductal dilatation. Mild prominence of the common duct, likely postsurgical.  Pancreas: Within normal limits.  Spleen: Within normal limits.  Adrenals/Urinary Tract: 2.3 x 1.7 cm left adrenal nodule (series 21/ image 26), indeterminate, suspicious for metastasis given the associated findings.  Kidneys are within normal limits.  No hydronephrosis.  Bladder is underdistended.  Stomach/Bowel: Stomach is notable for a small hiatal hernia.  No evidence of small bowel obstruction.  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  Associated gross pericolonic extension with associated pericolonic soft tissue implants/nodes, for example measuring 1.7 x 2.2 cm medially (series 21/image 28) and 2.1 x 2.6 cm laterally (series 21/ image 25).  Dilated mobile cecum, which is present in the left lower abdomen, measuring up to 11.1 cm (series 21/image 64).  Vascular/Lymphatic: Atherosclerotic calcifications of the  abdominal aorta and branch vessels.  Associated mesenteric/ retroperitoneal nodes, including a 10 mm short axis node beneath the left renal vein (series 21/image 42) and a 12 mm short axis node anterior to the pancreatic tail (series 201/ image 29).  Reproductive: Uterus is unremarkable.  Bilateral ovaries are within normal limits.  Other: Associated small volume pelvic ascites.  No free air.  Musculoskeletal: Degenerative changes of the visualized thoracolumbar spine.  Grade 1 anterolisthesis of L4 on L5.  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  Associated gross pericolonic extension with pericolonic soft tissue implants/nodes, as described above.  Associated mesenteric/retroperitoneal nodal metastases. 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  Associated small volume pelvic ascites.  No free air.  Dilated mobile cecum in the left lower abdomen, measuring up to 11.1 cm.  Trace left pleural effusion.  Small pericardial effusion.   Electronically Signed   By: Julian Hy M.D.   On: 12/16/2014 13:46     EKG Interpretation None      MDM   Final diagnoses:  Abdominal pain  Colonic mass Large bowel obstruction  74 yo female with at least two weeks of lower  abdominal pain.  Found to have obstructing colonic mass.  D/w Internal Medicine for admission and Surgery for consultation.      Serita Grit, MD 12/17/14 [phone]

## 2014-12-16 NOTE — Consult Note (Signed)
Referring Provider: Dr. Daryll Drown Primary Care Physician:  No PCP Per Patient Primary Gastroenterologist:  Althia Forts  Reason for Consultation:  Colonic Mass; Colonic obstruction  HPI: Kathryn Knapp is a 74 y.o. female being seen for a consult due to a colonic mass and obstruction of the distal transverse/splenic flexure seen on CT scan. She has been having 2 weeks of diffuse sharp constant abdominal pain that worsened this week. No N/V until today. Had a formed stool last Wed and reports loose stools since then. Denies melena or hematochezia. Has been dizzy and lightheaded this week. Poor appetite. Has lost over 20 pounds in the past few months but she is unsure exactly how much weight she has lost. CT scan shows an apple core lesion in the distal transverse/splenic flexure area with obstruction. Cecum dilated to 11 cm and in LLQ on CT. Denies fevers, chills, night sweats, dysuria, or hematuria. Daughters and grandchildren in room.  Past Medical History  Diagnosis Date  . Diabetes mellitus without complication   . Hypertension     Past Surgical History  Procedure Laterality Date  . Cholecystectomy    . Abdominal hysterectomy      Prior to Admission medications   Medication Sig Start Date End Date Taking? Authorizing Provider  ibuprofen (ADVIL,MOTRIN) 200 MG tablet Take 200 mg by mouth every 6 (six) hours as needed for mild pain or moderate pain.    Yes Historical Provider, MD  HYDROcodone-acetaminophen (NORCO/VICODIN) 5-325 MG per tablet Take 1 tablet by mouth every 6 (six) hours as needed for moderate pain. Patient not taking: Reported on 12/16/2014 05/31/14   Dalia Heading, PA-C  ibuprofen (ADVIL,MOTRIN) 800 MG tablet Take 1 tablet (800 mg total) by mouth every 8 (eight) hours as needed. Patient not taking: Reported on 12/16/2014 05/31/14   Dalia Heading, PA-C    Scheduled Meds: . heparin  5,000 Units Subcutaneous 3 times per day  . insulin aspart  0-9 Units Subcutaneous 6  times per day  .  morphine injection  4 mg Intravenous Once  . ondansetron  4 mg Oral Q4H   Or  . ondansetron (ZOFRAN) IV  4 mg Intravenous Q4H  . sodium chloride  3 mL Intravenous Q12H   Continuous Infusions: . sodium chloride 75 mL/hr at 12/16/14 1720   PRN Meds:.HYDROmorphone (DILAUDID) injection  Allergies as of 12/16/2014  . (No Known Allergies)    History reviewed. No pertinent family history.  History   Social History  . Marital Status: Divorced    Spouse Name: N/A  . Number of Children: N/A  . Years of Education: N/A   Occupational History  . Not on file.   Social History Main Topics  . Smoking status: Never Smoker   . Smokeless tobacco: Not on file  . Alcohol Use: No  . Drug Use: No  . Sexual Activity: Not on file   Other Topics Concern  . Not on file   Social History Narrative    Review of Systems: All negative except as stated above in HPI.  Physical Exam: Vital signs: Filed Vitals:   12/16/14 1716  BP: 158/56  Pulse: 74  Temp: 97.7 F (36.5 C)  Resp: 16     General:  Cachetic, lethargic, mild acute distress Head: atraumatic Eyes: anicteric, pupils equal ENT: oropharynx clear, lips dry Neck: nontender Lungs:  Clear throughout to auscultation.   No wheezes, crackles, or rhonchi. No acute distress. Heart:  Regular rate and rhythm; no murmurs, clicks, rubs,  or gallops.  Abdomen: diffusely tender (greatest in LLQ) with guarding, +distention, +BS  Rectal:  Deferred Ext: no edema, pulses intact Neuro: oriented, lethargic  GI:  Lab Results:  Recent Labs  12/16/14 1004  WBC 14.5*  HGB 9.7*  HCT 31.3*  PLT 465*   BMET  Recent Labs  12/16/14 1004  NA 135  K 2.3*  CL 98  CO2 26  GLUCOSE 144*  BUN 15  CREATININE 0.89  CALCIUM 8.4   LFT  Recent Labs  12/16/14 1004  PROT 6.3  ALBUMIN 2.7*  AST 26  ALT 14  ALKPHOS 89  BILITOT 1.1   PT/INR  Recent Labs  12/16/14 1632  LABPROT 15.2  INR 1.19      Studies/Results: Ct Abdomen Pelvis W Contrast  12/16/2014   CLINICAL DATA:  Mid/lower abdominal pain x2 weeks, nausea/vomiting, diarrhea  EXAM: CT ABDOMEN AND PELVIS WITH CONTRAST  TECHNIQUE: Multidetector CT imaging of the abdomen and pelvis was performed using the standard protocol following bolus administration of intravenous contrast.  CONTRAST:  169mL OMNIPAQUE IOHEXOL 300 MG/ML  SOLN  COMPARISON:  100 mL Omnipaque 300 IV  FINDINGS: Lower chest: Mild dependent atelectasis at the lung bases. Trace left pleural effusion.  Small pericardial effusion. Coronary atherosclerosis. Atherosclerotic calcifications of the aortic arch/bruit.  Coronary atherosclerosis in the distal esophagus, suggesting gastroesophageal reflux or esophageal dysmotility.  Hepatobiliary: Liver is within normal limits. No suspicious/ enhancing hepatic lesions.  Status post cholecystectomy. No intrahepatic ductal dilatation. Mild prominence of the common duct, likely postsurgical.  Pancreas: Within normal limits.  Spleen: Within normal limits.  Adrenals/Urinary Tract: 2.3 x 1.7 cm left adrenal nodule (series 21/ image 26), indeterminate, suspicious for metastasis given the associated findings.  Kidneys are within normal limits.  No hydronephrosis.  Bladder is underdistended.  Stomach/Bowel: Stomach is notable for a small hiatal hernia.  No evidence of small bowel obstruction.  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  Associated gross pericolonic extension with associated pericolonic soft tissue implants/nodes, for example measuring 1.7 x 2.2 cm medially (series 21/image 28) and 2.1 x 2.6 cm laterally (series 21/ image 25).  Dilated mobile cecum, which is present in the left lower abdomen, measuring up to 11.1 cm (series 21/image 64).  Vascular/Lymphatic: Atherosclerotic calcifications of the abdominal  aorta and branch vessels.  Associated mesenteric/ retroperitoneal nodes, including a 10 mm short axis node beneath the left renal vein (series 21/image 42) and a 12 mm short axis node anterior to the pancreatic tail (series 201/ image 29).  Reproductive: Uterus is unremarkable.  Bilateral ovaries are within normal limits.  Other: Associated small volume pelvic ascites.  No free air.  Musculoskeletal: Degenerative changes of the visualized thoracolumbar spine.  Grade 1 anterolisthesis of L4 on L5.  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  Associated gross pericolonic extension with pericolonic soft tissue implants/nodes, as described above.  Associated mesenteric/retroperitoneal nodal metastases. 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  Associated small volume pelvic ascites.  No free air.  Dilated mobile cecum in the left lower abdomen, measuring up to 11.1 cm.  Trace left pleural effusion.  Small pericardial effusion.   Electronically Signed   By: Julian Hy M.D.   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 Surgery has seen the patient and is requesting a colonoscopy as the next step to see if decompression of the colon proximal to the mass is possible prior to surgery. Since mass is likely malignant based on CT findings and surgery will be needed prior to discharge since it is an obstructing lesion biopsies of it will likely not be necessary. If the mass is found to be completely obstructing or near complete obstruction, then she will need emergent surgery. Will give 2 tap water enemas tonight and do colonoscopy tomorrow morning. Risks/benefits of the colonoscopy were discussed with the patient and her family and they agree to proceed. D/W Dr. Rosendo Gros.    LOS: 0 days   [place] C.  12/16/2014,  7:23 PM

## 2014-12-16 NOTE — Consult Note (Signed)
Reason for Consult: transverse colon mass Referring Physician: [name] is an 74 y.o. female.  HPI: The patient is a 74 year old female who presents today secondary to abdominal pain in her lower abdomen. Patient also with some nausea and vomiting. She also states she's had some weight loss however is unsure of the exact amount. Patient states that the abdominal pain began approximately 2 weeks ago. She does state that she's had some diarrhea and change her bowel habits.  Patient underwent a CT scan ER which revealed a mass in the splenic flexure. This has also shown a large bowel obstruction with the majority of liquid stool from the cecum distally to the mass.  CT scan also reveals a nodule to the left adrenal gland. Radiology states this potential could be a metastasis. Patient states she has had no headaches, palpitations, or excessive hypertension.  Patient has hadn't had minimal medical care prior to today's visit. She has had no previous colonoscopy.    Past Medical History  Diagnosis Date  . Diabetes mellitus without complication   . Hypertension     Past Surgical History  Procedure Laterality Date  . Cholecystectomy    . Abdominal hysterectomy      No family history on file.  Social History:  reports that she has never smoked. She does not have any smokeless tobacco history on file. She reports that she does not drink alcohol or use illicit drugs.  Allergies: No Known Allergies  Medications: I have reviewed the patient's current medications.  Results for orders placed or performed during the hospital encounter of 12/16/14 (from the past 48 hour(s))  CBC with Differential     Status: Abnormal   Collection Time: 12/16/14 10:04 AM  Result Value Ref Range   WBC 14.5 (H) 4.0 - 10.5 K/uL   RBC 3.97 3.87 - 5.11 MIL/uL   Hemoglobin 9.7 (L) 12.0 - 15.0 g/dL   HCT 31.3 (L) 36.0 - 46.0 %   MCV 78.8 78.0 - 100.0 fL   MCH 24.4 (L) 26.0 - 34.0 pg   MCHC 31.0  30.0 - 36.0 g/dL   RDW 15.5 11.5 - 15.5 %   Platelets 465 (H) 150 - 400 K/uL   Neutrophils Relative % 93 (H) 43 - 77 %   Neutro Abs 13.5 (H) 1.7 - 7.7 K/uL   Lymphocytes Relative 4 (L) 12 - 46 %   Lymphs Abs 0.5 (L) 0.7 - 4.0 K/uL   Monocytes Relative 3 3 - 12 %   Monocytes Absolute 0.4 0.1 - 1.0 K/uL   Eosinophils Relative 0 0 - 5 %   Eosinophils Absolute 0.0 0.0 - 0.7 K/uL   Basophils Relative 0 0 - 1 %   Basophils Absolute 0.0 0.0 - 0.1 K/uL  Comprehensive metabolic panel     Status: Abnormal   Collection Time: 12/16/14 10:04 AM  Result Value Ref Range   Sodium 135 135 - 145 mmol/L   Potassium 2.3 (LL) 3.5 - 5.1 mmol/L    Comment: REPEATED TO VERIFY CRITICAL RESULT CALLED TO, READ BACK BY AND VERIFIED WITH: SHELTONHRN [id number] MCCAULEG    Chloride 98 96 - 112 mmol/L   CO2 26 19 - 32 mmol/L   Glucose, Bld 144 (H) 70 - 99 mg/dL   BUN 15 6 - 23 mg/dL   Creatinine, Ser 0.89 0.50 - 1.10 mg/dL   Calcium 8.4 8.4 - 10.5 mg/dL   Total Protein 6.3 6.0 - 8.3 g/dL  Albumin 2.7 (L) 3.5 - 5.2 g/dL   AST 26 0 - 37 U/L   ALT 14 0 - 35 U/L   Alkaline Phosphatase 89 39 - 117 U/L   Total Bilirubin 1.1 0.3 - 1.2 mg/dL   GFR calc non Af Amer 63 (L) >90 mL/min   GFR calc Af Amer 73 (L) >90 mL/min    Comment: (NOTE) The eGFR has been calculated using the CKD EPI equation. This calculation has not been validated in all clinical situations. eGFR's persistently <90 mL/min signify possible Chronic Kidney Disease.    Anion gap 11 5 - 15  Lipase, blood     Status: None   Collection Time: 12/16/14 10:04 AM  Result Value Ref Range   Lipase 15 11 - 59 U/L    Ct Abdomen Pelvis W Contrast  12/16/2014   CLINICAL DATA:  Mid/lower abdominal pain x2 weeks, nausea/vomiting, diarrhea  EXAM: CT ABDOMEN AND PELVIS WITH CONTRAST  TECHNIQUE: Multidetector CT imaging of the abdomen and pelvis was performed using the standard protocol following bolus administration of intravenous contrast.  CONTRAST:   156mL OMNIPAQUE IOHEXOL 300 MG/ML  SOLN  COMPARISON:  100 mL Omnipaque 300 IV  FINDINGS: Lower chest: Mild dependent atelectasis at the lung bases. Trace left pleural effusion.  Small pericardial effusion. Coronary atherosclerosis. Atherosclerotic calcifications of the aortic arch/bruit.  Coronary atherosclerosis in the distal esophagus, suggesting gastroesophageal reflux or esophageal dysmotility.  Hepatobiliary: Liver is within normal limits. No suspicious/ enhancing hepatic lesions.  Status post cholecystectomy. No intrahepatic ductal dilatation. Mild prominence of the common duct, likely postsurgical.  Pancreas: Within normal limits.  Spleen: Within normal limits.  Adrenals/Urinary Tract: 2.3 x 1.7 cm left adrenal nodule (series 21/ image 26), indeterminate, suspicious for metastasis given the associated findings.  Kidneys are within normal limits.  No hydronephrosis.  Bladder is underdistended.  Stomach/Bowel: Stomach is notable for a small hiatal hernia.  No evidence of small bowel obstruction.  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  Associated gross pericolonic extension with associated pericolonic soft tissue implants/nodes, for example measuring 1.7 x 2.2 cm medially (series 21/image 28) and 2.1 x 2.6 cm laterally (series 21/ image 25).  Dilated mobile cecum, which is present in the left lower abdomen, measuring up to 11.1 cm (series 21/image 64).  Vascular/Lymphatic: Atherosclerotic calcifications of the abdominal aorta and branch vessels.  Associated mesenteric/ retroperitoneal nodes, including a 10 mm short axis node beneath the left renal vein (series 21/image 42) and a 12 mm short axis node anterior to the pancreatic tail (series 201/ image 29).  Reproductive: Uterus is unremarkable.  Bilateral ovaries are within normal limits.  Other: Associated small volume pelvic  ascites.  No free air.  Musculoskeletal: Degenerative changes of the visualized thoracolumbar spine.  Grade 1 anterolisthesis of L4 on L5.  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  Associated gross pericolonic extension with pericolonic soft tissue implants/nodes, as described above.  Associated mesenteric/retroperitoneal nodal metastases. 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  Associated small volume pelvic ascites.  No free air.  Dilated mobile cecum in the left lower abdomen, measuring up to 11.1 cm.  Trace left pleural effusion.  Small pericardial effusion.   Electronically Signed   By: Julian Hy M.D.   On: 12/16/2014  13:46    Review of Systems  Constitutional: Positive for weight loss (unsure of how much). Negative for diaphoresis.  HENT: Negative.   Eyes: Negative.   Respiratory: Negative.   Cardiovascular: Negative.   Gastrointestinal: Positive for nausea, vomiting, abdominal pain and diarrhea. Negative for constipation and blood in stool.  Musculoskeletal: Negative.   Skin: Negative.   Neurological: Negative.    Blood pressure 141/58, pulse 65, temperature 97.3 F (36.3 C), temperature source Oral, resp. rate 19, SpO2 98 %. Physical Exam  Constitutional: She is oriented to person, place, and time. She appears well-developed and well-nourished.  HENT:  Head: Normocephalic and atraumatic.  Eyes: Conjunctivae and EOM are normal. Pupils are equal, round, and reactive to light.  Neck: Normal range of motion. Neck supple.  Cardiovascular: Normal rate, regular rhythm and normal heart sounds.   Respiratory: Effort normal and breath sounds normal.  GI: Soft. She exhibits distension. She exhibits no mass. There is tenderness (suprapubically). There is no rebound and no guarding.  Musculoskeletal: Normal range of motion.  Neurological: She is alert and oriented to person,  place, and time.  Skin: Skin is warm and dry.    Assessment/Plan: 74 year old female with a large splenic flexure mass and large bowel obstruction.  1. The patient will ultimately require left colectomy to encompass the splenic flexure mass. 2. The patient will benefit from colonoscopy to evaluate the mass as well as any proximal masses, and decompression of the proximal colon. 3. The patient will require CT scan of her chest, and CEA levels. 4. Patient will require ruling out of any vasoactive adrenal mass.  5. We will follow along.   Kathryn Knapp., Kathryn Knapp 12/16/2014, 4:35 PM

## 2014-12-16 NOTE — ED Notes (Signed)
Pt reports lower abdominal pain for 2 weeks. Pt states that he had 1 episode of N/V 1 week ago. Pt states that the pain is constant with intermittent sharp pains. Denies in bowel or urinary problems.

## 2014-12-16 NOTE — H&P (Signed)
Date: 12/16/2014               Patient Name:  Kathryn Knapp MRN: [id number]  DOB: 15-Mar-1941 Age / Sex: 74 y.o., female   PCP: No Pcp Per Patient         Medical Service: Internal Medicine Teaching Service         Attending Physician: Dr. Sid Falcon, MD    First Contact: Dr. Raelene Bott Pager: [phone]  Second Contact: Dr. Ronnald Ramp Pager: [phone]       After Hours (After 5p/  First Contact Pager: [phone]  weekends / holidays): Second Contact Pager: [phone]   Chief Complaint: Abdominal Pain  History of Present Illness:   Patient is a 74 year old with a remote history of hypertension and diabetes who resents with a two-week history of lower abdominal pain. Patient states that the pain is severe, sharp and knife like in quality, and nonradiating. She denies any associated symptoms over the last 2 weeks. She denies any associated nausea, vomiting, or diarrhea. She states that it is a constant pain that does not come and go. She has noted possibly weight loss over the last 3-4 weeks. She also notes shortness of breath with exertion over the last week preventing her from being able to walk to the bus stop with her grandchildren. She denies any associated chest pain. Today, she did note nausea and vomiting although this was after the administration of pain medications. She also noted some diarrhea today as well but denied having any blood in her stool today or before that. She denies feeling any new masses or enlarged lymph nodes throughout her body. She states that she does not take any medications at home. Remotely, she has taken lisinopril for blood pressure and a diabetes medication as well but she had discontinued this as she has not seen a primary care provider in a long time. Otherwise, patient denies any fevers, night sweats, chills, dysuria, hematuria, or constipation.  In the emergency department, patient received 4 mg of morphine, 1 mg of Dilaudid, metoclopramide 10 mg, Zofran 4 mg, potassium  chloride 40 mEq, 1 L of normal saline.  Meds: Medications Prior to Admission  Medication Sig Dispense Refill  . ibuprofen (ADVIL,MOTRIN) 200 MG tablet Take 200 mg by mouth every 6 (six) hours as needed for mild pain or moderate pain.     Marland Kitchen HYDROcodone-acetaminophen (NORCO/VICODIN) 5-325 MG per tablet Take 1 tablet by mouth every 6 (six) hours as needed for moderate pain. (Patient not taking: Reported on 12/16/2014) 8 tablet 0  . ibuprofen (ADVIL,MOTRIN) 800 MG tablet Take 1 tablet (800 mg total) by mouth every 8 (eight) hours as needed. (Patient not taking: Reported on 12/16/2014) 21 tablet 0   Current Facility-Administered Medications  Medication Dose Route Frequency Provider Last Rate Last Dose  . 0.9 %  sodium chloride infusion   Intravenous Continuous Corky Sox, MD 75 mL/hr at 12/16/14 1720    . heparin injection 5,000 Units  5,000 Units Subcutaneous 3 times per day Corky Sox, MD   5,000 Units at 12/16/14 1816  . HYDROmorphone (DILAUDID) injection 1 mg  1 mg Intravenous Q3H PRN Corky Sox, MD   1 mg at 12/16/14 1831  . insulin aspart (novoLOG) injection 0-9 Units  0-9 Units Subcutaneous 6 times per day Corky Sox, MD   2 Units at 12/16/14 1816  . morphine 4 MG/ML injection 4 mg  4 mg Intravenous Once Serita Grit, MD  4 mg at 12/16/14 1201  . ondansetron (ZOFRAN) tablet 4 mg  4 mg Oral Q4H Corky Sox, MD       Or  . ondansetron [facility]) injection 4 mg  4 mg Intravenous Q4H Corky Sox, MD   4 mg at 12/16/14 1817  . sodium chloride 0.9 % injection 3 mL  3 mL Intravenous Q12H Corky Sox, MD        Past Medical History  Diagnosis Date  . Diabetes mellitus without complication   . Hypertension     Past Surgical History  Procedure Laterality Date  . Cholecystectomy    . Abdominal hysterectomy       Allergies: Allergies as of 12/16/2014  . (No Known Allergies)    History reviewed. No pertinent family history.  History   Social History  . Marital Status:  Divorced    Spouse Name: N/A  . Number of Children: N/A  . Years of Education: N/A   Occupational History  . Not on file.   Social History Main Topics  . Smoking status: Never Smoker   . Smokeless tobacco: Not on file  . Alcohol Use: No  . Drug Use: No  . Sexual Activity: Not on file   Other Topics Concern  . Not on file   Social History Narrative     Review of Systems: All pertinent ROS as stated in HPI.   Physical Exam: Blood pressure 158/56, pulse 74, temperature 97.7 F (36.5 C), temperature source Oral, resp. rate 16, height 5\' 11"  (1.803 m), SpO2 99 %.  General: resting in bed HEENT: PERRL, EOMI, no scleral icterus Cardiac: RRR, 2/6 systolic murmur, no rubs or gallops Pulm: clear to auscultation bilaterally, moving normal volumes of air Abd: soft, diffusely tender, mild distention, no masses palpated, BS present Ext: warm and well perfused, no pedal edema Neuro: alert and oriented X3, cranial nerves II-XII grossly intact Skin: no rashes or lesions noted Psych: appropriate affect  Lab results: Basic Metabolic Panel:  Recent Labs  12/16/14 1004 12/16/14 1632  NA 135  --   K 2.3*  --   CL 98  --   CO2 26  --   GLUCOSE 144*  --   BUN 15  --   CREATININE 0.89  --   CALCIUM 8.4  --   MG  --  1.8   Liver Function Tests:  Recent Labs  12/16/14 1004  AST 26  ALT 14  ALKPHOS 89  BILITOT 1.1  PROT 6.3  ALBUMIN 2.7*    Recent Labs  12/16/14 1004  LIPASE 15   No results for input(s): AMMONIA in the last 72 hours. CBC:  Recent Labs  12/16/14 1004  WBC 14.5*  NEUTROABS 13.5*  HGB 9.7*  HCT 31.3*  MCV 78.8  PLT 465*   Cardiac Enzymes: No results for input(s): CKTOTAL, CKMB, CKMBINDEX, TROPONINI in the last 72 hours. BNP: No results for input(s): PROBNP in the last 72 hours. D-Dimer: No results for input(s): DDIMER in the last 72 hours. CBG:  Recent Labs  12/16/14 1751  GLUCAP 183*   Hemoglobin A1C: No results for input(s):  HGBA1C in the last 72 hours. Fasting Lipid Panel: No results for input(s): CHOL, HDL, LDLCALC, TRIG, CHOLHDL, LDLDIRECT in the last 72 hours. Thyroid Function Tests: No results for input(s): TSH, T4TOTAL, FREET4, T3FREE, THYROIDAB in the last 72 hours. Anemia Panel: No results for input(s): VITAMINB12, FOLATE, FERRITIN, TIBC, IRON, RETICCTPCT in the last 72 hours. Coagulation:  Recent Labs  12/16/14 1632  LABPROT 15.2  INR 1.19   Urine Drug Screen: Drugs of Abuse  No results found for: LABOPIA, COCAINSCRNUR, LABBENZ, AMPHETMU, THCU, LABBARB  Alcohol Level: No results for input(s): ETH in the last 72 hours. Urinalysis: No results for input(s): COLORURINE, LABSPEC, PHURINE, GLUCOSEU, HGBUR, BILIRUBINUR, KETONESUR, PROTEINUR, UROBILINOGEN, NITRITE, LEUKOCYTESUR in the last 72 hours.  Invalid input(s): APPERANCEUR  Imaging results:  Ct Abdomen Pelvis W Contrast  12/16/2014   CLINICAL DATA:  Mid/lower abdominal pain x2 weeks, nausea/vomiting, diarrhea  EXAM: CT ABDOMEN AND PELVIS WITH CONTRAST  TECHNIQUE: Multidetector CT imaging of the abdomen and pelvis was performed using the standard protocol following bolus administration of intravenous contrast.  CONTRAST:  140mL OMNIPAQUE IOHEXOL 300 MG/ML  SOLN  COMPARISON:  100 mL Omnipaque 300 IV  FINDINGS: Lower chest: Mild dependent atelectasis at the lung bases. Trace left pleural effusion.  Small pericardial effusion. Coronary atherosclerosis. Atherosclerotic calcifications of the aortic arch/bruit.  Coronary atherosclerosis in the distal esophagus, suggesting gastroesophageal reflux or esophageal dysmotility.  Hepatobiliary: Liver is within normal limits. No suspicious/ enhancing hepatic lesions.  Status post cholecystectomy. No intrahepatic ductal dilatation. Mild prominence of the common duct, likely postsurgical.  Pancreas: Within normal limits.  Spleen: Within normal limits.  Adrenals/Urinary Tract: 2.3 x 1.7 cm left adrenal nodule (series  21/ image 26), indeterminate, suspicious for metastasis given the associated findings.  Kidneys are within normal limits.  No hydronephrosis.  Bladder is underdistended.  Stomach/Bowel: Stomach is notable for a small hiatal hernia.  No evidence of small bowel obstruction.  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  Associated gross pericolonic extension with associated pericolonic soft tissue implants/nodes, for example measuring 1.7 x 2.2 cm medially (series 21/image 28) and 2.1 x 2.6 cm laterally (series 21/ image 25).  Dilated mobile cecum, which is present in the left lower abdomen, measuring up to 11.1 cm (series 21/image 64).  Vascular/Lymphatic: Atherosclerotic calcifications of the abdominal aorta and branch vessels.  Associated mesenteric/ retroperitoneal nodes, including a 10 mm short axis node beneath the left renal vein (series 21/image 42) and a 12 mm short axis node anterior to the pancreatic tail (series 201/ image 29).  Reproductive: Uterus is unremarkable.  Bilateral ovaries are within normal limits.  Other: Associated small volume pelvic ascites.  No free air.  Musculoskeletal: Degenerative changes of the visualized thoracolumbar spine.  Grade 1 anterolisthesis of L4 on L5.  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  Associated gross pericolonic extension with pericolonic soft tissue implants/nodes, as described above.  Associated mesenteric/retroperitoneal nodal metastases. 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  Associated small volume pelvic ascites.  No free air.  Dilated mobile cecum in the left lower abdomen, measuring up to 11.1 cm.  Trace left pleural effusion.  Small pericardial effusion.   Electronically Signed   By: Julian Hy M.D.   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 There is also a highly suspicious left adrenal mass concerning for metastasis. Patient reports some weight loss over the last month. Pain somewhat well controlled with opiates and patient not presenting currently with a acute abdomen. Patient has a reactive leukocytosis of 14.5 and thrombocythemia of 465. Patient is afebrile and does not exhibit any complicating signs of infection from the suspected malignancy. Patient does have trace pleural and pericardial effusion although there are no signs of respiratory or hemodynamic compromise. -Appreciate surgery recommendations: Plan for left colectomy ultimately. Colonoscopy to evaluate mass. CT scan of chest. CEA levels. Metanephrines to evaluate for adrenal mass. -Weight (patient states that weight in 2014).  -Consult GI -Ins and outs -Supplemental oxygen PRN -Check PT/INR -FOBT -Morning CBC and BMP -NS at 75 ml/hour for 12 hours -Dilaudid 1 mg q 3 hours  -Ondansetron 4 mg q 4 hours  Dyspnea on exertion: Patient with no cardiac history. Question whether this is related to deconditioning secondary to malignancy. -chest x-ray -EKG  Bradycardia: Currently asymptomatic. -Admit to telemetry -EKG  Normocytic anemia: Hemoglobin of 9.7 with an MCV of 78.8. This seems to be new compared to prior from  February 2014. Likely secondary to anemia of chronic disease although there is certainly a possible correlation with colonic malignancy. -Reticulocytes -Iron, TIBC, ferritin  Diabetes: Glucose elevated at 144. Patient is not on any diabetes medications at home. -SSI q 4 hours -A1c  History of hypertension: Currently normotensive.   Hypokalemia: Presenting with potassium of 2.3. -Patient status post potassium chloride in the emergency department. -Check magnesium -Repeat BMP  Code: Full  Diet: NPO Prophylaxis: Heparin subq  Dispo: Disposition is deferred at this time, awaiting improvement of current medical problems. Anticipated discharge in approximately 3 day(s).   The patient does not have a current PCP (No Pcp Per Patient) and does need an [facility] follow-up appointment after discharge.  The patient does not have transportation limitations that hinder transportation to clinic appointments.  Signed: Luan Moore, M.D., Ph.D. Internal Medicine Teaching Service, PGY-1 12/16/2014, 7:03 PM

## 2014-12-16 NOTE — ED Notes (Signed)
Critical K 2.3, MD [place] aware.

## 2014-12-17 ENCOUNTER — Inpatient Hospital Stay (HOSPITAL_COMMUNITY): Payer: Medicare Other | Admitting: Anesthesiology

## 2014-12-17 ENCOUNTER — Inpatient Hospital Stay (HOSPITAL_COMMUNITY): Payer: Medicare Other

## 2014-12-17 ENCOUNTER — Encounter (HOSPITAL_COMMUNITY): Payer: Self-pay | Admitting: Radiology

## 2014-12-17 ENCOUNTER — Encounter (HOSPITAL_COMMUNITY)
Admission: EM | Disposition: A | Discharge status: Rehabilitation Facility | Payer: Self-pay | Source: Home / Self Care | Attending: Internal Medicine

## 2014-12-17 DIAGNOSIS — A419 Sepsis, unspecified organism: Secondary | ICD-10-CM | POA: Diagnosis present

## 2014-12-17 DIAGNOSIS — R4182 Altered mental status, unspecified: Secondary | ICD-10-CM

## 2014-12-17 DIAGNOSIS — K5660 Unspecified intestinal obstruction: Secondary | ICD-10-CM

## 2014-12-17 DIAGNOSIS — R6521 Severe sepsis with septic shock: Secondary | ICD-10-CM

## 2014-12-17 DIAGNOSIS — Z9049 Acquired absence of other specified parts of digestive tract: Secondary | ICD-10-CM

## 2014-12-17 HISTORY — PX: LAPAROTOMY: SHX154

## 2014-12-17 LAB — ABO/RH: ABO/RH(D): A POS

## 2014-12-17 LAB — POCT I-STAT 7, (LYTES, BLD GAS, ICA,H+H)
Acid-base deficit: 2 mmol/L (ref 0.0–2.0)
Acid-base deficit: 6 mmol/L — ABNORMAL HIGH (ref 0.0–2.0)
Bicarbonate: 23.4 mEq/L (ref 20.0–24.0)
Bicarbonate: 19.3 mEq/L — ABNORMAL LOW (ref 20.0–24.0)
CALCIUM ION: 1.1 mmol/L — AB (ref 1.13–1.30)
CALCIUM ION: 1.08 mmol/L — AB (ref 1.13–1.30)
HCT: 27 % — ABNORMAL LOW (ref 36.0–46.0)
HEMATOCRIT: 31 % — AB (ref 36.0–46.0)
HEMOGLOBIN: 10.5 g/dL — AB (ref 12.0–15.0)
HEMOGLOBIN: 9.2 g/dL — AB (ref 12.0–15.0)
O2 Saturation: 100 %
O2 Saturation: 99 %
PCO2 ART: 41 mmHg (ref 35.0–45.0)
PH ART: 7.321 — AB (ref 7.350–7.450)
POTASSIUM: 3.7 mmol/L (ref 3.5–5.1)
Patient temperature: 36.7
Potassium: 4.3 mmol/L (ref 3.5–5.1)
SODIUM: 138 mmol/L (ref 135–145)
Sodium: 136 mmol/L (ref 135–145)
TCO2: 25 mmol/L (ref 0–100)
TCO2: 20 mmol/L (ref 0–100)
pCO2 arterial: 37.3 mmHg (ref 35.0–45.0)
pH, Arterial: 7.36 (ref 7.350–7.450)
pO2, Arterial: 363 mmHg — ABNORMAL HIGH (ref 80.0–100.0)
pO2, Arterial: 162 mmHg — ABNORMAL HIGH (ref 80.0–100.0)

## 2014-12-17 LAB — POCT I-STAT 3, ART BLOOD GAS (G3+)
ACID-BASE DEFICIT: 8 mmol/L — AB (ref 0.0–2.0)
Bicarbonate: 17.5 mEq/L — ABNORMAL LOW (ref 20.0–24.0)
O2 Saturation: 99 %
PCO2 ART: 35.4 mmHg (ref 35.0–45.0)
PO2 ART: 138 mmHg — AB (ref 80.0–100.0)
Patient temperature: 97.4
TCO2: 19 mmol/L (ref 0–100)
pH, Arterial: 7.298 — ABNORMAL LOW (ref 7.350–7.450)

## 2014-12-17 LAB — BASIC METABOLIC PANEL
Anion gap: 17 — ABNORMAL HIGH (ref 5–15)
Anion gap: 11 (ref 5–15)
BUN: 19 mg/dL (ref 6–20)
BUN: 21 mg/dL — ABNORMAL HIGH (ref 6–20)
CALCIUM: 8.1 mg/dL — AB (ref 8.9–10.3)
CO2: 20 mmol/L — ABNORMAL LOW (ref 22–32)
CO2: 20 mmol/L — ABNORMAL LOW (ref 22–32)
CREATININE: 1.2 mg/dL — AB (ref 0.44–1.00)
Calcium: 7.2 mg/dL — ABNORMAL LOW (ref 8.9–10.3)
Chloride: 101 mmol/L (ref 101–111)
Chloride: 107 mmol/L (ref 101–111)
Creatinine, Ser: 1.53 mg/dL — ABNORMAL HIGH (ref 0.44–1.00)
GFR calc Af Amer: 38 mL/min — ABNORMAL LOW (ref >60)
GFR calc non Af Amer: 33 mL/min — ABNORMAL LOW (ref >60)
GFR, EST AFRICAN AMERICAN: 51 mL/min — AB (ref >60)
GFR, EST NON AFRICAN AMERICAN: 44 mL/min — AB (ref >60)
GLUCOSE: 95 mg/dL (ref 70–99)
Glucose, Bld: 168 mg/dL — ABNORMAL HIGH (ref 70–99)
POTASSIUM: 3.5 mmol/L (ref 3.5–5.1)
Potassium: 4.3 mmol/L (ref 3.5–5.1)
SODIUM: 138 mmol/L (ref 135–145)
Sodium: 138 mmol/L (ref 135–145)

## 2014-12-17 LAB — POCT I-STAT 4, (NA,K, GLUC, HGB,HCT)
Glucose, Bld: 120 mg/dL — ABNORMAL HIGH (ref 70–99)
HCT: 28 % — ABNORMAL LOW (ref 36.0–46.0)
Hemoglobin: 9.5 g/dL — ABNORMAL LOW (ref 12.0–15.0)
Potassium: 3.7 mmol/L (ref 3.5–5.1)
Sodium: 138 mmol/L (ref 135–145)

## 2014-12-17 LAB — CBC
HCT: 36.8 % (ref 36.0–46.0)
HEMATOCRIT: 32.8 % — AB (ref 36.0–46.0)
HEMOGLOBIN: 11.5 g/dL — AB (ref 12.0–15.0)
HEMOGLOBIN: 10 g/dL — AB (ref 12.0–15.0)
MCH: 25.2 pg — AB (ref 26.0–34.0)
MCH: 24.4 pg — ABNORMAL LOW (ref 26.0–34.0)
MCHC: 31.3 g/dL (ref 30.0–36.0)
MCHC: 30.5 g/dL (ref 30.0–36.0)
MCV: 80.7 fL (ref 78.0–100.0)
MCV: 80.2 fL (ref 78.0–100.0)
Platelets: 553 10*3/uL — ABNORMAL HIGH (ref 150–400)
Platelets: 462 10*3/uL — ABNORMAL HIGH (ref 150–400)
RBC: 4.56 MIL/uL (ref 3.87–5.11)
RBC: 4.09 MIL/uL (ref 3.87–5.11)
RDW: 16.1 % — ABNORMAL HIGH (ref 11.5–15.5)
RDW: 15.7 % — ABNORMAL HIGH (ref 11.5–15.5)
WBC: 4.7 10*3/uL (ref 4.0–10.5)
WBC: 4.5 10*3/uL (ref 4.0–10.5)

## 2014-12-17 LAB — GLUCOSE, CAPILLARY
GLUCOSE-CAPILLARY: 111 mg/dL — AB (ref 70–99)
GLUCOSE-CAPILLARY: 96 mg/dL (ref 70–99)
Glucose-Capillary: 205 mg/dL — ABNORMAL HIGH (ref 70–99)
Glucose-Capillary: 179 mg/dL — ABNORMAL HIGH (ref 70–99)

## 2014-12-17 LAB — SURGICAL PCR SCREEN
MRSA, PCR: NEGATIVE
MRSA, PCR: NEGATIVE
Staphylococcus aureus: NEGATIVE
Staphylococcus aureus: NEGATIVE

## 2014-12-17 LAB — CREATININE, SERUM
CREATININE: 1.51 mg/dL — AB (ref 0.44–1.00)
GFR calc Af Amer: 38 mL/min — ABNORMAL LOW (ref >60)
GFR calc non Af Amer: 33 mL/min — ABNORMAL LOW (ref >60)

## 2014-12-17 LAB — LACTIC ACID, PLASMA
LACTIC ACID, VENOUS: 3.2 mmol/L — AB (ref 0.5–2.0)
LACTIC ACID, VENOUS: 3.8 mmol/L — AB (ref 0.5–2.0)
LACTIC ACID, VENOUS: 2.6 mmol/L — AB (ref 0.5–2.0)
Lactic Acid, Venous: 4.6 mmol/L (ref 0.5–2.0)

## 2014-12-17 LAB — CARBOXYHEMOGLOBIN
CARBOXYHEMOGLOBIN: 0.8 % (ref 0.5–1.5)
Methemoglobin: 0.9 % (ref 0.0–1.5)
O2 Saturation: 72.1 %
Total hemoglobin: 9.7 g/dL — ABNORMAL LOW (ref 12.0–16.0)

## 2014-12-17 LAB — PREPARE RBC (CROSSMATCH)

## 2014-12-17 LAB — OCCULT BLOOD X 1 CARD TO LAB, STOOL: Fecal Occult Bld: NEGATIVE

## 2014-12-17 LAB — TRIGLYCERIDES: Triglycerides: 97 mg/dL (ref <150)

## 2014-12-17 SURGERY — LAPAROTOMY, EXPLORATORY
Anesthesia: General | Site: Abdomen

## 2014-12-17 MED ORDER — ALBUMIN HUMAN 5 % IV SOLN
INTRAVENOUS | Status: DC | PRN
  Administered 2014-12-17: 08:00:00 via INTRAVENOUS

## 2014-12-17 MED ORDER — DEXTROSE-NACL 5-0.9 % IV SOLN
INTRAVENOUS | Status: DC
  Administered 2014-12-17: 13:00:00 via INTRAVENOUS
  Filled 2014-12-17 (×3): qty 1000

## 2014-12-17 MED ORDER — KCL IN DEXTROSE-NACL 20-5-0.9 MEQ/L-%-% IV SOLN
INTRAVENOUS | Status: DC
  Administered 2014-12-17: 10:00:00 via INTRAVENOUS
  Filled 2014-12-17 (×2): qty 1000

## 2014-12-17 MED ORDER — PROPOFOL INFUSION 10 MG/ML OPTIME
INTRAVENOUS | Status: DC | PRN
  Administered 2014-12-17: 25 ug/kg/min via INTRAVENOUS

## 2014-12-17 MED ORDER — DEXTROSE 5 % IV SOLN
2.0000 g | INTRAVENOUS | Status: AC
  Administered 2014-12-17: 2 g via INTRAVENOUS
  Filled 2014-12-17: qty 2

## 2014-12-17 MED ORDER — CETYLPYRIDINIUM CHLORIDE 0.05 % MT LIQD
7.0000 mL | Freq: Four times a day (QID) | OROMUCOSAL | Status: DC
  Administered 2014-12-17 – 2014-12-21 (×16): 7 mL via OROMUCOSAL

## 2014-12-17 MED ORDER — HEPARIN SODIUM (PORCINE) 5000 UNIT/ML IJ SOLN
5000.0000 [IU] | Freq: Three times a day (TID) | INTRAMUSCULAR | Status: DC
  Administered 2014-12-17 – 2014-12-21 (×12): 5000 [IU] via SUBCUTANEOUS
  Filled 2014-12-17 (×14): qty 1

## 2014-12-17 MED ORDER — 0.9 % SODIUM CHLORIDE (POUR BTL) OPTIME
TOPICAL | Status: DC | PRN
  Administered 2014-12-17: 1000 mL

## 2014-12-17 MED ORDER — FENTANYL CITRATE (PF) 100 MCG/2ML IJ SOLN: 50.0000 ug | INTRAMUSCULAR | Status: DC | PRN

## 2014-12-17 MED ORDER — NOREPINEPHRINE BITARTRATE 1 MG/ML IV SOLN
2.0000 ug/min | INTRAVENOUS | Status: DC
  Administered 2014-12-17: 15 ug/min via INTRAVENOUS
  Filled 2014-12-17: qty 4

## 2014-12-17 MED ORDER — PANTOPRAZOLE SODIUM 40 MG IV SOLR
40.0000 mg | INTRAVENOUS | Status: DC
  Administered 2014-12-17 – 2014-12-21 (×5): 40 mg via INTRAVENOUS
  Filled 2014-12-17 (×6): qty 40

## 2014-12-17 MED ORDER — SODIUM CHLORIDE 0.9 % IV SOLN
INTRAVENOUS | Status: DC | PRN
  Administered 2014-12-17: 09:00:00 via INTRAVENOUS

## 2014-12-17 MED ORDER — ALBUTEROL SULFATE (2.5 MG/3ML) 0.083% IN NEBU: 2.5000 mg | INHALATION_SOLUTION | RESPIRATORY_TRACT | Status: DC | PRN

## 2014-12-17 MED ORDER — FENTANYL CITRATE (PF) 250 MCG/5ML IJ SOLN
INTRAMUSCULAR | Status: DC | PRN
  Administered 2014-12-17: 100 ug via INTRAVENOUS
  Administered 2014-12-17 (×3): 50 ug via INTRAVENOUS
  Administered 2014-12-17: 100 ug via INTRAVENOUS

## 2014-12-17 MED ORDER — SODIUM CHLORIDE 0.9 % IV SOLN
25.0000 ug/h | INTRAVENOUS | Status: DC
  Administered 2014-12-17: 25 ug/h via INTRAVENOUS
  Administered 2014-12-18: 175 ug/h via INTRAVENOUS
  Administered 2014-12-19: 100 ug/h via INTRAVENOUS
  Filled 2014-12-17 (×3): qty 50

## 2014-12-17 MED ORDER — SUCCINYLCHOLINE CHLORIDE 20 MG/ML IJ SOLN
INTRAMUSCULAR | Status: DC | PRN
  Administered 2014-12-17: 120 mg via INTRAVENOUS

## 2014-12-17 MED ORDER — ETOMIDATE 2 MG/ML IV SOLN
INTRAVENOUS | Status: DC | PRN
  Administered 2014-12-17: 14 mg via INTRAVENOUS

## 2014-12-17 MED ORDER — HYDROMORPHONE HCL 1 MG/ML IJ SOLN
1.0000 mg | Freq: Once | INTRAMUSCULAR | Status: AC
  Administered 2014-12-17: 1 mg via INTRAVENOUS
  Filled 2014-12-17: qty 1

## 2014-12-17 MED ORDER — PHENYLEPHRINE HCL 10 MG/ML IJ SOLN
0.0000 ug/min | INTRAVENOUS | Status: DC
  Administered 2014-12-17 (×2): 130 ug/min via INTRAVENOUS
  Administered 2014-12-17: 70 ug/min via INTRAVENOUS
  Filled 2014-12-17 (×3): qty 1

## 2014-12-17 MED ORDER — SODIUM CHLORIDE 0.9 % IV SOLN: Freq: Once | INTRAVENOUS | Status: DC

## 2014-12-17 MED ORDER — IOHEXOL 300 MG/ML  SOLN
80.0000 mL | Freq: Once | INTRAMUSCULAR | Status: AC | PRN
  Administered 2014-12-17: 80 mL via INTRAVENOUS

## 2014-12-17 MED ORDER — PROPOFOL 1000 MG/100ML IV EMUL: 0.0000 ug/kg/min | INTRAVENOUS | Status: DC

## 2014-12-17 MED ORDER — SODIUM CHLORIDE 0.9 % IV SOLN
INTRAVENOUS | Status: DC | PRN
  Administered 2014-12-17 (×2): via INTRAVENOUS

## 2014-12-17 MED ORDER — PROMETHAZINE HCL 25 MG/ML IJ SOLN
6.2500 mg | INTRAMUSCULAR | Status: DC | PRN
Start: 2014-12-17 — End: 2014-12-21

## 2014-12-17 MED ORDER — NOREPINEPHRINE BITARTRATE 1 MG/ML IV SOLN
2.0000 ug/min | INTRAVENOUS | Status: DC
  Administered 2014-12-17: 14 ug/min via INTRAVENOUS
  Administered 2014-12-17: 20 ug/min via INTRAVENOUS
  Filled 2014-12-17 (×3): qty 4

## 2014-12-17 MED ORDER — PROPOFOL 1000 MG/100ML IV EMUL: 5.0000 ug/kg/min | INTRAVENOUS | Status: DC

## 2014-12-17 MED ORDER — IPRATROPIUM-ALBUTEROL 0.5-2.5 (3) MG/3ML IN SOLN: 3.0000 mL | RESPIRATORY_TRACT | Status: DC

## 2014-12-17 MED ORDER — CHLORHEXIDINE GLUCONATE 0.12 % MT SOLN
15.0000 mL | Freq: Two times a day (BID) | OROMUCOSAL | Status: DC
  Administered 2014-12-17 – 2014-12-21 (×9): 15 mL via OROMUCOSAL
  Filled 2014-12-17 (×9): qty 15

## 2014-12-17 MED ORDER — ALBUMIN HUMAN 5 % IV SOLN
25.0000 g | Freq: Once | INTRAVENOUS | Status: AC
  Administered 2014-12-17: 25 g via INTRAVENOUS
  Filled 2014-12-17: qty 500

## 2014-12-17 MED ORDER — SODIUM CHLORIDE 0.9 % IV BOLUS (SEPSIS)
1000.0000 mL | Freq: Once | INTRAVENOUS | Status: AC
  Administered 2014-12-17: 1000 mL via INTRAVENOUS

## 2014-12-17 MED ORDER — MIDAZOLAM HCL 2 MG/2ML IJ SOLN: 1.0000 mg | INTRAMUSCULAR | Status: DC | PRN

## 2014-12-17 MED ORDER — FENTANYL CITRATE (PF) 100 MCG/2ML IJ SOLN: 50.0000 ug | Freq: Once | INTRAMUSCULAR | Status: DC

## 2014-12-17 MED ORDER — MIDAZOLAM HCL 2 MG/2ML IJ SOLN
INTRAMUSCULAR | Status: AC
  Filled 2014-12-17: qty 2

## 2014-12-17 MED ORDER — MIDAZOLAM HCL 2 MG/2ML IJ SOLN
INTRAMUSCULAR | Status: DC | PRN
  Administered 2014-12-17: 2 mg via INTRAVENOUS

## 2014-12-17 MED ORDER — IPRATROPIUM-ALBUTEROL 0.5-2.5 (3) MG/3ML IN SOLN: 3.0000 mL | RESPIRATORY_TRACT | Status: DC | PRN

## 2014-12-17 MED ORDER — MIDAZOLAM HCL 2 MG/2ML IJ SOLN
1.0000 mg | INTRAMUSCULAR | Status: DC | PRN
  Administered 2014-12-17 – 2014-12-19 (×5): 1 mg via INTRAVENOUS
  Filled 2014-12-17 (×5): qty 2

## 2014-12-17 MED ORDER — LACTATED RINGERS IV SOLN
INTRAVENOUS | Status: DC | PRN
  Administered 2014-12-17: 07:00:00 via INTRAVENOUS

## 2014-12-17 MED ORDER — DEXTROSE 5 % IV SOLN
10.0000 mg | INTRAVENOUS | Status: DC | PRN
  Administered 2014-12-17: 80 ug/min via INTRAVENOUS

## 2014-12-17 MED ORDER — ROCURONIUM BROMIDE 100 MG/10ML IV SOLN
INTRAVENOUS | Status: DC | PRN
  Administered 2014-12-17: 30 mg via INTRAVENOUS
  Administered 2014-12-17: 20 mg via INTRAVENOUS

## 2014-12-17 MED ORDER — FENTANYL CITRATE (PF) 250 MCG/5ML IJ SOLN
INTRAMUSCULAR | Status: AC
Start: 2014-12-17 — End: 2014-12-17
  Filled 2014-12-17: qty 5

## 2014-12-17 MED ORDER — HYDROMORPHONE HCL 1 MG/ML IJ SOLN: 0.2500 mg | INTRAMUSCULAR | Status: DC | PRN

## 2014-12-17 MED ORDER — PIPERACILLIN-TAZOBACTAM 3.375 G IVPB
3.3750 g | Freq: Three times a day (TID) | INTRAVENOUS | Status: DC
  Administered 2014-12-17 – 2014-12-22 (×15): 3.375 g via INTRAVENOUS
  Filled 2014-12-17 (×18): qty 50

## 2014-12-17 MED ORDER — PROPOFOL 10 MG/ML IV BOLUS
INTRAVENOUS | Status: AC
  Filled 2014-12-17: qty 20

## 2014-12-17 MED ORDER — SODIUM CHLORIDE 0.9 % IV BOLUS (SEPSIS): 500.0000 mL | INTRAVENOUS | Status: AC

## 2014-12-17 MED ORDER — INSULIN ASPART 100 UNIT/ML ~~LOC~~ SOLN
2.0000 [IU] | SUBCUTANEOUS | Status: DC
  Administered 2014-12-17: 4 [IU] via SUBCUTANEOUS
  Administered 2014-12-17: 6 [IU] via SUBCUTANEOUS
  Administered 2014-12-18: 2 [IU] via SUBCUTANEOUS
  Administered 2014-12-18: 4 [IU] via SUBCUTANEOUS
  Administered 2014-12-19: 2 [IU] via SUBCUTANEOUS
  Administered 2014-12-19 – 2014-12-20 (×4): 4 [IU] via SUBCUTANEOUS
  Administered 2014-12-20 – 2014-12-21 (×4): 2 [IU] via SUBCUTANEOUS

## 2014-12-17 MED ORDER — SODIUM CHLORIDE 0.9 % IV SOLN
INTRAVENOUS | Status: DC
  Administered 2014-12-17: 17:00:00 via INTRAVENOUS

## 2014-12-17 MED ORDER — FENTANYL BOLUS VIA INFUSION
25.0000 ug | INTRAVENOUS | Status: DC | PRN
  Filled 2014-12-17: qty 25

## 2014-12-17 MED ORDER — FENTANYL CITRATE (PF) 250 MCG/5ML IJ SOLN
INTRAMUSCULAR | Status: AC
  Filled 2014-12-17: qty 5

## 2014-12-17 SURGICAL SUPPLY — 57 items
BLADE SURG ROTATE 9660 (MISCELLANEOUS) IMPLANT
CANISTER SUCTION 2500CC (MISCELLANEOUS) ×4 IMPLANT
CHLORAPREP W/TINT 26ML (MISCELLANEOUS) ×4 IMPLANT
COVER MAYO STAND STRL (DRAPES) IMPLANT
COVER SURGICAL LIGHT HANDLE (MISCELLANEOUS) ×4 IMPLANT
DRAIN CHANNEL 19F RND (DRAIN) ×3 IMPLANT
DRAPE LAPAROSCOPIC ABDOMINAL (DRAPES) ×4 IMPLANT
DRAPE PROXIMA HALF (DRAPES) IMPLANT
DRAPE UTILITY XL STRL (DRAPES) ×8 IMPLANT
DRAPE WARM FLUID 44X44 (DRAPE) ×4 IMPLANT
DRSG OPSITE POSTOP 4X10 (GAUZE/BANDAGES/DRESSINGS) IMPLANT
DRSG OPSITE POSTOP 4X8 (GAUZE/BANDAGES/DRESSINGS) IMPLANT
ELECT BLADE 6.5 EXT (BLADE) ×3 IMPLANT
ELECT CAUTERY BLADE 6.4 (BLADE) ×8 IMPLANT
ELECT REM PT RETURN 9FT ADLT (ELECTROSURGICAL) ×4
ELECTRODE REM PT RTRN 9FT ADLT (ELECTROSURGICAL) ×2 IMPLANT
EVACUATOR SILICONE 100CC (DRAIN) ×3 IMPLANT
GAUZE SPONGE 4X4 12PLY STRL (GAUZE/BANDAGES/DRESSINGS) ×3 IMPLANT
GLOVE BIO SURGEON STRL SZ7.5 (GLOVE) ×4 IMPLANT
GLOVE BIOGEL PI IND STRL 6.5 (GLOVE) ×1 IMPLANT
GLOVE BIOGEL PI IND STRL 8 (GLOVE) ×2 IMPLANT
GLOVE BIOGEL PI INDICATOR 6.5 (GLOVE) ×2
GLOVE BIOGEL PI INDICATOR 8 (GLOVE) ×2
GOWN STRL REUS W/ TWL LRG LVL3 (GOWN DISPOSABLE) ×4 IMPLANT
GOWN STRL REUS W/ TWL XL LVL3 (GOWN DISPOSABLE) ×2 IMPLANT
GOWN STRL REUS W/TWL LRG LVL3 (GOWN DISPOSABLE) ×8
GOWN STRL REUS W/TWL XL LVL3 (GOWN DISPOSABLE) ×4
KIT BASIN OR (CUSTOM PROCEDURE TRAY) ×4 IMPLANT
KIT ROOM TURNOVER OR (KITS) ×4 IMPLANT
LIGASURE IMPACT 36 18CM CVD LR (INSTRUMENTS) ×3 IMPLANT
NS IRRIG 1000ML POUR BTL (IV SOLUTION) ×8 IMPLANT
PACK GENERAL/GYN (CUSTOM PROCEDURE TRAY) ×4 IMPLANT
PAD ABD 8X10 STRL (GAUZE/BANDAGES/DRESSINGS) ×3 IMPLANT
PAD ARMBOARD 7.5X6 YLW CONV (MISCELLANEOUS) ×8 IMPLANT
PENCIL BUTTON HOLSTER BLD 10FT (ELECTRODE) IMPLANT
RELOAD PROXIMATE 75MM BLUE (ENDOMECHANICALS) ×8 IMPLANT
RELOAD STAPLE 75 3.8 BLU REG (ENDOMECHANICALS) IMPLANT
SPECIMEN JAR LARGE (MISCELLANEOUS) IMPLANT
SPONGE LAP 18X18 X RAY DECT (DISPOSABLE) ×18 IMPLANT
STAPLER PROXIMATE 75MM BLUE (STAPLE) ×3 IMPLANT
STAPLER VISISTAT 35W (STAPLE) ×4 IMPLANT
SUCTION POOLE TIP (SUCTIONS) ×4 IMPLANT
SUT ETHILON 2 0 FS 18 (SUTURE) ×3 IMPLANT
SUT NOVA 1 T20/GS 25DT (SUTURE) ×3 IMPLANT
SUT PDS AB 1 TP1 96 (SUTURE) ×11 IMPLANT
SUT SILK 2 0 SH CR/8 (SUTURE) ×4 IMPLANT
SUT SILK 2 0 TIES 10X30 (SUTURE) ×7 IMPLANT
SUT SILK 3 0 SH CR/8 (SUTURE) ×4 IMPLANT
SUT SILK 3 0 TIES 10X30 (SUTURE) ×4 IMPLANT
SUT VIC AB 3-0 SH 18 (SUTURE) ×3 IMPLANT
TAPE CLOTH SURG 4X10 WHT LF (GAUZE/BANDAGES/DRESSINGS) ×3 IMPLANT
TOWEL OR 17X26 10 PK STRL BLUE (TOWEL DISPOSABLE) ×4 IMPLANT
TRAY FOLEY CATH 14FRSI W/METER (CATHETERS) ×3 IMPLANT
TRAY FOLEY CATH 16FRSI W/METER (SET/KITS/TRAYS/PACK) ×3 IMPLANT
TUBE CONNECTING 12'X1/4 (SUCTIONS) ×1
TUBE CONNECTING 12X1/4 (SUCTIONS) ×2 IMPLANT
YANKAUER SUCT BULB TIP NO VENT (SUCTIONS) ×3 IMPLANT

## 2014-12-17 NOTE — Op Note (Signed)
12/16/2014 - 12/17/2014  9:24 AM  PATIENT:  Kathryn Knapp  74 y.o. female  PRE-OPERATIVE DIAGNOSIS:  perforated bowel  POST-OPERATIVE DIAGNOSIS:  Perforation of the cecum, large obstructing tumor at the splenic flexure with gross tumor extension into the diaphragm and stomach  PROCEDURE:  Procedure(s): Exploratory laparotomy, Right, Transverse, & partial left colectomy with en bloc partial gastrectomy, end ileostomy  SURGEON:  Georganna Skeans, M.D.  ASSISTANTS: Ralene Ok, M.D.   ANESTHESIA:   general  EBL:  Total I/O In: 2585 [I.V.:2000; Blood:335; IV Piggyback:250] Out: 230 [Urine:105; Blood:125]  BLOOD ADMINISTERED:none  DRAINS: (1) Jackson-Pratt drain(s) with closed bulb suction in the \\Left  upper quadrant   SPECIMEN:  Excision  DISPOSITION OF SPECIMEN:  PATHOLOGY  COUNTS:  YES  DICTATION: .Dragon Dictation Kathryn Knapp was admitted with a large bowel obstruction due to a colon tumor at her splenic flexure. Follow-up CT study done during the night demonstrated free air consistent with perforation. She is brought for emergent exploration. Informed consent was obtained from the family. She received intravenous and a box. She was brought to the operating room and general endotracheal anesthesia was administered by the anesthesia staff. Foley catheter was placed by nursing. Abdomen was prepped and draped in a sterile fashion. We did a time out procedure. Midline incision was made. Subcutaneous tissues were dissected down revealing the anterior fascia. This was divided sharply in the upper midline portion of the perineal cavity was entered under direct vision. The fascia was opened down to the umbilicus where we encountered some omental adhesions. These were gradually taken down from the anterior abdominal wall and the fascia was opened below the umbilicus. Further exploration revealed left lower quadrant adhesions up to the abdominal wall which were taken down. There was copious liquid  stool present in the abdomen. This was evacuated. Further adhesiolysis was done in the right upper quadrant from her cholecystectomy surgery. Once the abdominal wall was freed we open the upper midline up further. Exploration revealed a hole in the cecum. Suture was placed in here to control the effluent. The liquid stool was evacuated from the abdomen. Exploration revealed the liver to be smooth, there was a large mass in the splenic flexure of the colon adherent to the diaphragm and stomach  Decision was made to remove the right, transverse, and a portion of the left colon. Terminal ileum was divided with the GIA stapler. We mobilized the right colon from the lateral peritoneal attachments. Hepatic flexure was mobilized protecting the duodenum. Transverse colon was then mobilized and we divided the gastrocolic omentum. As we progressed up towards the tumor we noted to be densely adherent to the diaphragm in the left upper quadrant. It was bluntly taken down from the diaphragm without damaging the diaphragm but leaving tumor in place. It was also growing into the side of the greater curvature of the stomach. This portion the stomach was divided with a GIA-75 leaving the tumor en bloc with the specimen. Remainder the stomach was okay and we confirmed position of the gastric tube. Next we mobilized the proximal left: And continued the mobilization up to the splenic flexure. Tumor was further bluntly mobilized from surrounding tissues. Next, we took down the mesentery of the mobilized colon using the LigaSure. We did suture ligation of the ileocolic, right colic, and middle colic vessels. Remainder of the mesentery was taken down with LigaSure. As we approached the tumor in the left upper quadrant was carefully dissected off of the ligament of Treitz and jejunum,  again leaving some tumor in place in order to avoid damaging the jejunum. Specimen was removed and sent to pathology. Abdomen was copiously irrigated.  Spleen was inspected and there was no bleeding. We ensured hemostasis along the mesenteric dissection. Abdomen was irrigated with multiple liters of warm saline. We placed a 20 [place] Blake drain and the left upper quadrant. Distal ileum was mobilized. Circular incision was made in the skin of the right lower quadrant. Cruciate incision was made in the fascia and a space for the ileostomy was made. Terminal ileum was brought out. It was tacked with 0 silks to the inside of the fascia. Counts were correct. Midline fascia was closed with running #1 looped PDS and intermittent figure-of-eight #1 Novafil sutures. Subcutaneous tissues were irrigated. Ileostomy was matured with interrupted 3-0 Vicryl's. A finger past through beyond the fascia demonstrate patency. Ostomy appliance was applied. Sterile wet-to-dry dressing was placed on the midline. All counts were again correct. Patient tolerated the procedure without apparent complication but remained in critical condition on the ventilator and was taken directly to the intensive care unit.  PATIENT DISPOSITION:  ICU - intubated and critically ill.   Delay start of Pharmacological VTE agent (>24hrs) due to surgical blood loss or risk of bleeding:  no  Georganna Skeans, MD, MPH, FACS Pager: [phone]  5/1/20169:24 AM

## 2014-12-17 NOTE — Consult Note (Signed)
PULMONARY / CRITICAL CARE MEDICINE   Name: Kathryn Knapp MRN: [id number] DOB: 1941/02/10    ADMISSION DATE:  12/16/2014 CONSULTATION DATE:  Alphonzo Grieve  REFERRING MD :  Grandville Silos   CHIEF COMPLAINT:  Vent management   INITIAL PRESENTATION:  74 yo female with hx HTN, DM presented initially 4/30 with abd pain, n/v and weight loss.  CT abdomen revealed large bowel obstruction and colonic mass.  She was prepped for colonoscopy for decompression prior to elective surgery.  However a CT chest to look for metastasis revealed large amount free air and pt was taken urgently to OR.   Large amount of stool noted with concern for significant peritonitis/ sepsis and pt was left intubated post op.  PCCM consulted to assist with vent management.    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 Associated mesenteric/retroperitoneal nodal metastases. 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  SIGNIFICANT EVENTS: 5/1 OR>> exp lap,  Partial colectomy with ileostomy r/t perforated bowel   HISTORY OF PRESENT ILLNESS: 74 yo female with hx HTN, DM presented initially 4/30 with abd pain, n/v and weight loss.  CT abdomen revealed large bowel obstruction and colonic mass.  She was prepped for colonoscopy for decompression prior to elective surgery.  However a CT chest to look for metastasis revealed large amount free air and pt was taken urgently to OR.  Large amount of stool noted with concern for significant peritonitis/ sepsis and pt was left intubated post op.  PCCM consulted to assist with vent management.    PAST MEDICAL HISTORY :   has a past medical history of Diabetes mellitus without complication and Hypertension.  has past surgical  history that includes Cholecystectomy and Abdominal hysterectomy. Prior to Admission medications   Medication Sig Start Date End Date Taking? Authorizing Provider  ibuprofen (ADVIL,MOTRIN) 200 MG tablet Take 200 mg by mouth every 6 (six) hours as needed for mild pain or moderate pain.    Yes Historical Provider, MD  HYDROcodone-acetaminophen (NORCO/VICODIN) 5-325 MG per tablet Take 1 tablet by mouth every 6 (six) hours as needed for moderate pain. Patient not taking: Reported on 12/16/2014 05/31/14   Dalia Heading, PA-C  ibuprofen (ADVIL,MOTRIN) 800 MG tablet Take 1 tablet (800 mg total) by mouth every 8 (eight) hours as needed. Patient not taking: Reported on 12/16/2014 05/31/14   Dalia Heading, PA-C   No Known Allergies  FAMILY HISTORY:  has no family status information on file.  SOCIAL HISTORY:  reports that she has never smoked. She does not have any smokeless tobacco history on file. She reports that she does not drink alcohol or use illicit drugs.  REVIEW OF SYSTEMS:  Unable.  Pt sedated on vent   SUBJECTIVE:   VITAL SIGNS: Temp:  [97.6 F (36.4 C)-98.6 F (37 C)] 98.6 F (37 C) (05/01 0602) Pulse Rate:  [65-107] 73 (05/01 1030) Resp:  [12-23] 16 (05/01 1030) BP: (101-158)/(42-63) 101/50 mmHg (05/01 0602) SpO2:  [87 %-100 %] 100 % (05/01 1030) Arterial Line BP: (83-115)/(41-47) 114/47 mmHg (05/01 1030) Weight:  [156 lb 12 oz (71.1 kg)] 156 lb 12 oz (71.1 kg) (05/01 1000) HEMODYNAMICS:   VENTILATOR SETTINGS:   INTAKE / OUTPUT:  Intake/Output Summary (Last 24 hours)  at 12/17/14 1152 Last data filed at 12/17/14 1025  Gross per 24 hour  Intake   2785 ml  Output    330 ml  Net   2455 ml    PHYSICAL EXAMINATION: General:  Chronically ill appearing, thin female, NAD  Neuro:  Sedated on vent, RASS -2 post op HEENT:  Mm moist, no JDV, ETT  Cardiovascular:  s1s2 rrr Lungs:  resps even non labored on vent, few scattered rhonchi otherwise clear  Abdomen:  Round,  soft, midline incision c/d, ostomy without stool  Musculoskeletal:  Cool, dry, no edema   LABS:  CBC  Recent Labs Lab 12/16/14 1004 12/17/14 0540  WBC 14.5* 4.7  HGB 9.7* 11.5*  HCT 31.3* 36.8  PLT 465* 553*   Coag's  Recent Labs Lab 12/16/14 1632  INR 1.19   BMET  Recent Labs Lab 12/16/14 1004 12/16/14 1853 12/17/14 0540  NA 135 137 138  K 2.3* 2.8* 4.3  CL 98 100 101  CO2 26 21 20*  BUN 15 15 19   CREATININE 0.89 0.99 1.20*  GLUCOSE 144* 196* 95   Electrolytes  Recent Labs Lab 12/16/14 1004 12/16/14 1632 12/16/14 1853 12/17/14 0540  CALCIUM 8.4  --  8.1* 8.1*  MG  --  1.8  --   --    Sepsis Markers No results for input(s): LATICACIDVEN, PROCALCITON, O2SATVEN in the last 168 hours. ABG No results for input(s): PHART, PCO2ART, PO2ART in the last 168 hours. Liver Enzymes  Recent Labs Lab 12/16/14 1004  AST 26  ALT 14  ALKPHOS 89  BILITOT 1.1  ALBUMIN 2.7*   Cardiac Enzymes No results for input(s): TROPONINI, PROBNP in the last 168 hours. Glucose  Recent Labs Lab 12/16/14 1751 12/16/14 2009 12/17/14 0005 12/17/14 0411  GLUCAP 183* 163* 111* 96    Imaging Ct Abdomen Pelvis W Contrast  12/16/2014   CLINICAL DATA:  Mid/lower abdominal pain x2 weeks, nausea/vomiting, diarrhea  EXAM: CT ABDOMEN AND PELVIS WITH CONTRAST  TECHNIQUE: Multidetector CT imaging of the abdomen and pelvis was performed using the standard protocol following bolus administration of intravenous contrast.  CONTRAST:  126mL OMNIPAQUE IOHEXOL 300 MG/ML  SOLN  COMPARISON:  100 mL Omnipaque 300 IV  FINDINGS: Lower chest: Mild dependent atelectasis at the lung bases. Trace left pleural effusion.  Small pericardial effusion. Coronary atherosclerosis. Atherosclerotic calcifications of the aortic arch/bruit.  Coronary atherosclerosis in the distal esophagus, suggesting gastroesophageal reflux or esophageal dysmotility.  Hepatobiliary: Liver is within normal limits. No suspicious/  enhancing hepatic lesions.  Status post cholecystectomy. No intrahepatic ductal dilatation. Mild prominence of the common duct, likely postsurgical.  Pancreas: Within normal limits.  Spleen: Within normal limits.  Adrenals/Urinary Tract: 2.3 x 1.7 cm left adrenal nodule (series 21/ image 26), indeterminate, suspicious for metastasis given the associated findings.  Kidneys are within normal limits.  No hydronephrosis.  Bladder is underdistended.  Stomach/Bowel: Stomach is notable for a small hiatal hernia.  No evidence of small bowel obstruction.  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  Associated gross pericolonic extension with associated pericolonic soft tissue implants/nodes, for example measuring 1.7 x 2.2 cm medially (series 21/image 28) and 2.1 x 2.6 cm laterally (series 21/ image 25).  Dilated mobile cecum, which is present in the left lower abdomen, measuring up to 11.1 cm (series 21/image 64).  Vascular/Lymphatic: Atherosclerotic calcifications of the abdominal  aorta and branch vessels.  Associated mesenteric/ retroperitoneal nodes, including a 10 mm short axis node beneath the left renal vein (series 21/image 42) and a 12 mm short axis node anterior to the pancreatic tail (series 201/ image 29).  Reproductive: Uterus is unremarkable.  Bilateral ovaries are within normal limits.  Other: Associated small volume pelvic ascites.  No free air.  Musculoskeletal: Degenerative changes of the visualized thoracolumbar spine.  Grade 1 anterolisthesis of L4 on L5.  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  Associated gross pericolonic extension with pericolonic soft tissue implants/nodes, as described above.  Associated mesenteric/retroperitoneal nodal metastases. 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  Associated small volume pelvic ascites.  No free air.  Dilated mobile cecum in the left lower abdomen, measuring up to 11.1 cm.  Trace left pleural effusion.  Small pericardial effusion.   Electronically Signed   By: Julian Hy M.D.   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  S/p partial L colectomy with ileostomy  P:   Surgery following  NPO  OG to suction  Wound care  PPI   HEMATOLOGIC No active issue  P:  SQ heparin -ok with surgery  F/u CBC   INFECTIOUS Peritonitis  P:   BCx2 5/1>>> UC 5/1>>>  Zosyn 5/1>>>  ENDOCRINE No active issue  P:   Check TSH with weight loss although suspect r/t likely malignancy   NEUROLOGIC Sedation  P:   RASS goal: -1 D/c propofol with hypotension  Fentanyl gtt  PRN versed  Daily WUA   FAMILY  - Updates:  Daughter updated at bedside 5/1  [name], NP 12/17/2014  11:52 AM Pager: [phone] or [phone]  Patient with a tumor in her colon that caused obstruction and eventual rupture.  She was taken emergently to the OR and had a subtotal colectomy.  Given her overall functional status and the fact that she just had major abdominal surgery the decision was made to keep her on the ventilator.  PCCM was consulted for vent management.  During her exam, patient was hypotensive and neo was  started and was being increased.  Given that will need to place a TLC, follow CVP and will switch her from neo to levo given relative bradycardia.  Hydrate and will order AM labs.  The patient is critically ill with multiple organ systems failure and requires high complexity decision making for assessment and support, frequent evaluation and titration of therapies, application of advanced monitoring technologies and extensive interpretation of multiple databases.   Critical Care Time devoted to patient care services described in this note is  35  Minutes. This time reflects time  of care of this signee Dr Jennet Maduro. This critical care time does not reflect procedure time, or teaching time or supervisory time of PA/NP/Med student/Med Resident etc but could involve care discussion time.  Rush Farmer, M.D. [facility] Pulmonary/Critical Care Medicine. Pager: [phone]. After hours pager: [phone].

## 2014-12-17 NOTE — Anesthesia Preprocedure Evaluation (Addendum)
Anesthesia Evaluation  Patient identified by MRN, date of birth, ID band Patient awake    Reviewed: Allergy & Precautions, NPO status , Patient's Chart, lab work & pertinent test results  Airway Mallampati: II  TM Distance: >3 FB Neck ROM: Full    Dental  (+) Edentulous Upper, Edentulous Lower   Pulmonary neg pulmonary ROS,  breath sounds clear to auscultation        Cardiovascular hypertension, Rhythm:Regular Rate:Tachycardia  BP is low on arrival   Neuro/Psych negative neurological ROS     GI/Hepatic Perforated viscus. Nausea and vomting and contrast po tonite   Endo/Other  diabetes, Well Controlled  Renal/GU      Musculoskeletal   Abdominal   Peds  Hematology  (+) anemia ,   Anesthesia Other Findings   Reproductive/Obstetrics                          Anesthesia Physical Anesthesia Plan  ASA: IV and emergent  Anesthesia Plan: General   Post-op Pain Management:    Induction: Intravenous  Airway Management Planned: Oral ETT  Additional Equipment:   Intra-op Plan:   Post-operative Plan: Extubation in OR and Possible Post-op intubation/ventilation  Informed Consent: I have reviewed the patients History and Physical, chart, labs and discussed the procedure including the risks, benefits and alternatives for the proposed anesthesia with the patient or authorized representative who has indicated his/her understanding and acceptance.     Plan Discussed with: CRNA and Surgeon  Anesthesia Plan Comments:        Anesthesia Quick Evaluation

## 2014-12-17 NOTE — Progress Notes (Signed)
Updated Dr Joya Gaskins on pt status, CVP 3-4 and critical lactic acid level called at 3.8. Levophed at 15 mcg/min. Dr Joya Gaskins okay with CVP at this time, advised to titrate Levophed for pressure support as needed rather than fluid boluses, repeat Lactic acid in 3 hrs. Will continue to monitor closely.

## 2014-12-17 NOTE — Progress Notes (Signed)
I was informed by the radiologist that the pt's CT chest revealed some free fluid and air.   At the pt's bedside she states that she has central midline pain.  I informed her that secondary to the fact that she has a perforated viscus, we would need to proceed to the OR emergently.  I d/w her and her daughter that she will likely require an Ex Lap, bowel resection, and likely ostomy.  I discussed with her the possible risks and benefits of the procedure to include but not limited to: infection, possible future surgery, possible intubation, possible ostomy, and any other needed procedures.  The patient voiced understanding and wishes to proceed.

## 2014-12-17 NOTE — Progress Notes (Signed)
CRITICAL VALUE ALERT  Critical value received:  Lactic Acid 3.8  Date of notification: 12-17-14  Time of notification:  2039  Critical value read back:Yes.    Nurse who received alert:  Wyline Beady RN  MD notified (1st page):  Dr Huel Cote  Time of first page:  2040  MD notified (2nd page):  Time of second page:  Responding MD:  Dr Huel Cote  Time MD responded:  2040

## 2014-12-17 NOTE — Anesthesia Postprocedure Evaluation (Signed)
  Anesthesia Post-op Note  Patient: Kathryn Knapp  Procedure(s) Performed: Procedure(s): EXPLORATORY LAPAROTOMY Right, Transverse, partial left colectomy with en bloc partial gastrectomy illeostomy (N/A)  Patient Location: ICU  Anesthesia Type:General  Level of Consciousness: sedated, unresponsive and Patient remains intubated per anesthesia plan  Airway and Oxygen Therapy: Patient remains intubated per anesthesia plan  Post-op Pain: unable to evaluate due to sedation  Post-op Assessment: Post-op Vital signs reviewed, Patient's Cardiovascular Status Stable and PATIENT'S CARDIOVASCULAR STATUS UNSTABLE  Post-op Vital Signs: Reviewed and stable  Last Vitals:  Filed Vitals:   12/17/14 0602  BP: 101/50  Pulse: 107  Temp: 37 C  Resp: 18    Complications: No apparent anesthesia complications

## 2014-12-17 NOTE — Progress Notes (Signed)
eLink Physician-Brief Progress Note Patient Name: Kathryn Knapp DOB: [birth year] MRN: [id number]   Date of Service  12/17/2014  HPI/Events of Note  CVP < 5. Remains on vasopressors. Last lactate 2.6 (improving).   eICU Interventions  NS 1000 cc     Intervention Category Major Interventions: Shock - evaluation and management;Sepsis - evaluation and management  Merton Border 12/17/2014, 11:55 PM

## 2014-12-17 NOTE — Progress Notes (Signed)
Patient ID: Kathryn Knapp, female   DOB: [birth year], 74 y.o.   MRN: [id number] I spoke with her daughter at the bedside. Georganna Skeans, MD, MPH, FACS Trauma: [phone] General Surgery: [phone]

## 2014-12-17 NOTE — Procedures (Signed)
Central Venous Catheter Insertion Procedure Note Kathryn Knapp [id number] [birth year]  Procedure: Insertion of Central Venous Catheter Indications: Assessment of intravascular volume and Drug and/or fluid administration  Procedure Details Consent: Risks of procedure as well as the alternatives and risks of each were explained to the (patient/caregiver).  Consent for procedure obtained. Time Out: Verified patient identification, verified procedure, site/side was marked, verified correct patient position, special equipment/implants available, medications/allergies/relevent history reviewed, required imaging and test results available.  Performed  Maximum sterile technique was used including antiseptics, cap, gloves, gown, hand hygiene, mask and sheet. Skin prep: Chlorhexidine; local anesthetic administered A antimicrobial bonded/coated triple lumen catheter was placed in the right internal jugular vein using the Seldinger technique.  Evaluation Blood flow good Complications: No apparent complications Patient did tolerate procedure well. Chest X-ray ordered to verify placement.  CXR: pending.  Performed under direct MD supervision.  Performed using ultrasound guidance.  Wire visualized in vessel under ultrasound.   Kathryn Madrid, NP 12/17/2014  2:21 PM Pager: [phone] or [phone]  U/S used in placement.  I was present and supervised the entire procedure.  Rush Farmer, M.D. [facility] Pulmonary/Critical Care Medicine. Pager: [phone]. After hours pager: [phone].

## 2014-12-17 NOTE — Progress Notes (Signed)
Patient s/p colectomy w/ partial gastrectomy and end ileostomy, now intubated, sedated, on pressor support. Discussed w/ surgery, will take over care as primary team. Appreciate management, will continue to follow along.   Natasha Bence, MD PGY-2, Internal Medicine Pager: [phone]

## 2014-12-17 NOTE — Progress Notes (Addendum)
CRITICAL VALUE ALERT  Critical value received:  Lactic acid 2.6  Date of notification:  12-17-14  Time of notification:  2310  Critical value read back:Yes.    Nurse who received alert:  Wyline Beady, RN  MD notified (1st page):  N/a expected value trending down   Updated Dr Alva Garnet on pt status at 2345 and made aware of LA level

## 2014-12-17 NOTE — Progress Notes (Signed)
[place] Progress Note Patient Name: Kathryn Knapp DOB: 11/23/1940 MRN: [id number]   Date of Service  12/17/2014  HPI/Events of Note  Pt with septic shock state  eICU Interventions  Start sepsis protocol      Intervention Category Major Interventions: Sepsis - evaluation and management;Shock - evaluation and management  Asencion Noble 12/17/2014, 4:15 PM

## 2014-12-17 NOTE — Significant Event (Signed)
Event Note: Called by RN for 10/10 abdominal pain  S: Patient states that abdominal pain has "passed". Feels much better after receiving one extra dose of dilaudid.   O: Patient appears comfortable, bowel sounds are present in all 4 quadrants and are normal. Patient remains diffusely somewhat tender in her abdomen, most in LLQ.   A/P: Initial concern for bowel perforation, as patient reported 10/10 abdominal pain after 2 tap water enemas were given. However, bowel sounds in all four quadrants are reassuring as is patient's near complete relief with 1 mg dilaudid.   - Continue to monitor - Patient to receive chest CT now per surgery - Patient to receive colonoscopy Sunday am

## 2014-12-17 NOTE — Transfer of Care (Signed)
Immediate Anesthesia Transfer of Care Note  Patient: Kathryn Knapp  Procedure(s) Performed: Procedure(s): EXPLORATORY LAPAROTOMY Right, Transverse, partial left colectomy with en bloc partial gastrectomy illeostomy (N/A)  Patient Location: ICU  Anesthesia Type:General  Level of Consciousness: sedated, unresponsive and Patient remains intubated per anesthesia plan  Airway & Oxygen Therapy: Patient remains intubated per anesthesia plan and Patient placed on Ventilator (see vital sign flow sheet for setting)  Post-op Assessment: Report given to RN and Post -op Vital signs reviewed and stable  Post vital signs: Reviewed and stable  Last Vitals:  Filed Vitals:   12/17/14 0602  BP: 101/50  Pulse: 107  Temp: 37 C  Resp: 18    Complications: No apparent anesthesia complications

## 2014-12-17 NOTE — Progress Notes (Signed)
  Date: 12/17/2014  Patient name: Kathryn Knapp  Medical record number: [id number]  Date of birth: Apr 25, 1941   I have seen and evaluated Kathryn Knapp and discussed their care with the Residency Team.  Kathryn Knapp was admitted yesterday afternoon, she emergently went to surgery this morning and I was not able to see her until after surgery.  History was taken by discussion with team and chart review.  Briefly, Kathryn Knapp is 20 and has had limited medical care recently.  She presented with abdominal pain and weight loss.  She was found to have a mid colonic obstruction at the distal colon/splenic flexure on CT scan in the ED.  Surgery and GI were consulted.  Plans for flex/sig or colonoscopy were underway and she had enemas in preparation.  Chest CT was also done overnight and this showed air under the diaphragm, therefore she was taken emergently to surgery for concern for perforation.   I saw her in the surgical ICU after surgery.  She was intubated, sedated and requiring neosynephrine in the perioperative period.  She had a large midline bandage to her abdomen.  She also had an NGT in place.    Assessment and Plan: I have seen and evaluated the patient as outlined above. I agree with the formulated Assessment and Plan as detailed in the residents' admission note, with the following changes:   1. 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  Our team will follow peripherally only - ICU care per the ICU team  There are multiple other issues that will likely need to be monitored and worked up once she is well post her surgery.  These include bradycardia, anemia (likely malignancy related?), HTN and DM.  Once she is stable for transfer out of ICU, our team will likely resume management.   Kathryn Falcon, MD 5/1/201610:58 AM

## 2014-12-18 ENCOUNTER — Other Ambulatory Visit (HOSPITAL_COMMUNITY): Payer: Medicare Other

## 2014-12-18 ENCOUNTER — Inpatient Hospital Stay (HOSPITAL_COMMUNITY): Payer: Medicare Other

## 2014-12-18 ENCOUNTER — Encounter (HOSPITAL_COMMUNITY): Payer: Self-pay | Admitting: General Surgery

## 2014-12-18 DIAGNOSIS — A419 Sepsis, unspecified organism: Secondary | ICD-10-CM

## 2014-12-18 DIAGNOSIS — N179 Acute kidney failure, unspecified: Secondary | ICD-10-CM

## 2014-12-18 DIAGNOSIS — K6389 Other specified diseases of intestine: Secondary | ICD-10-CM

## 2014-12-18 DIAGNOSIS — I10 Essential (primary) hypertension: Secondary | ICD-10-CM

## 2014-12-18 DIAGNOSIS — R6521 Severe sepsis with septic shock: Secondary | ICD-10-CM

## 2014-12-18 DIAGNOSIS — R7881 Bacteremia: Secondary | ICD-10-CM

## 2014-12-18 DIAGNOSIS — J9601 Acute respiratory failure with hypoxia: Secondary | ICD-10-CM

## 2014-12-18 LAB — HEMOGLOBIN A1C
Hgb A1c MFr Bld: 5.8 % — ABNORMAL HIGH (ref 4.8–5.6)
Mean Plasma Glucose: 120 mg/dL

## 2014-12-18 LAB — POCT I-STAT 3, ART BLOOD GAS (G3+)
Acid-base deficit: 11 mmol/L — ABNORMAL HIGH (ref 0.0–2.0)
Bicarbonate: 15.7 mEq/L — ABNORMAL LOW (ref 20.0–24.0)
O2 Saturation: 99 %
PCO2 ART: 36.7 mmHg (ref 35.0–45.0)
PH ART: 7.24 — AB (ref 7.350–7.450)
TCO2: 17 mmol/L (ref 0–100)
pO2, Arterial: 156 mmHg — ABNORMAL HIGH (ref 80.0–100.0)

## 2014-12-18 LAB — BASIC METABOLIC PANEL
Anion gap: 8 (ref 5–15)
Anion gap: 11 (ref 5–15)
BUN: 26 mg/dL — ABNORMAL HIGH (ref 6–20)
BUN: 27 mg/dL — AB (ref 6–20)
CALCIUM: 6.6 mg/dL — AB (ref 8.9–10.3)
CALCIUM: 6.6 mg/dL — AB (ref 8.9–10.3)
CHLORIDE: 111 mmol/L (ref 101–111)
CO2: 17 mmol/L — ABNORMAL LOW (ref 22–32)
CO2: 14 mmol/L — ABNORMAL LOW (ref 22–32)
Chloride: 108 mmol/L (ref 101–111)
Creatinine, Ser: 1.79 mg/dL — ABNORMAL HIGH (ref 0.44–1.00)
Creatinine, Ser: 1.94 mg/dL — ABNORMAL HIGH (ref 0.44–1.00)
GFR calc Af Amer: 28 mL/min — ABNORMAL LOW (ref >60)
GFR calc non Af Amer: 24 mL/min — ABNORMAL LOW (ref >60)
GFR, EST AFRICAN AMERICAN: 31 mL/min — AB (ref >60)
GFR, EST NON AFRICAN AMERICAN: 27 mL/min — AB (ref >60)
GLUCOSE: 117 mg/dL — AB (ref 70–99)
GLUCOSE: 136 mg/dL — AB (ref 70–99)
POTASSIUM: 3.9 mmol/L (ref 3.5–5.1)
Potassium: 4.2 mmol/L (ref 3.5–5.1)
Sodium: 133 mmol/L — ABNORMAL LOW (ref 135–145)
Sodium: 136 mmol/L (ref 135–145)

## 2014-12-18 LAB — GLUCOSE, CAPILLARY
GLUCOSE-CAPILLARY: 113 mg/dL — AB (ref 70–99)
GLUCOSE-CAPILLARY: 120 mg/dL — AB (ref 70–99)
Glucose-Capillary: 131 mg/dL — ABNORMAL HIGH (ref 70–99)
Glucose-Capillary: 152 mg/dL — ABNORMAL HIGH (ref 70–99)
Glucose-Capillary: 57 mg/dL — ABNORMAL LOW (ref 70–99)
Glucose-Capillary: 70 mg/dL (ref 70–99)
Glucose-Capillary: 99 mg/dL (ref 70–99)

## 2014-12-18 LAB — CBC
HCT: 32.1 % — ABNORMAL LOW (ref 36.0–46.0)
HEMOGLOBIN: 10.5 g/dL — AB (ref 12.0–15.0)
MCH: 25.5 pg — ABNORMAL LOW (ref 26.0–34.0)
MCHC: 32.7 g/dL (ref 30.0–36.0)
MCV: 78.1 fL (ref 78.0–100.0)
Platelets: 217 10*3/uL (ref 150–400)
RBC: 4.11 MIL/uL (ref 3.87–5.11)
RDW: 15.9 % — ABNORMAL HIGH (ref 11.5–15.5)
WBC: 12.7 10*3/uL — ABNORMAL HIGH (ref 4.0–10.5)

## 2014-12-18 LAB — TROPONIN I

## 2014-12-18 MED ORDER — FLUCONAZOLE IN SODIUM CHLORIDE 400-0.9 MG/200ML-% IV SOLN
400.0000 mg | INTRAVENOUS | Status: DC
  Administered 2014-12-18: 400 mg via INTRAVENOUS
  Filled 2014-12-18 (×3): qty 200

## 2014-12-18 MED ORDER — DEXTROSE-NACL 5-0.45 % IV SOLN
INTRAVENOUS | Status: DC
  Administered 2014-12-18: 12:00:00 via INTRAVENOUS

## 2014-12-18 MED ORDER — DEXTROSE 5 % IV SOLN
2.0000 ug/min | INTRAVENOUS | Status: DC
  Administered 2014-12-18: 21 ug/min via INTRAVENOUS
  Administered 2014-12-18: 29 ug/min via INTRAVENOUS
  Administered 2014-12-19: 27 ug/min via INTRAVENOUS
  Administered 2014-12-19: 4 ug/min via INTRAVENOUS
  Filled 2014-12-18 (×3): qty 16

## 2014-12-18 MED ORDER — SODIUM CHLORIDE 0.9 % IV BOLUS (SEPSIS)
1000.0000 mL | Freq: Once | INTRAVENOUS | Status: AC
  Administered 2014-12-18: 1000 mL via INTRAVENOUS

## 2014-12-18 MED ORDER — DEXTROSE 50 % IV SOLN
INTRAVENOUS | Status: AC
  Administered 2014-12-18: 50 mL
  Filled 2014-12-18: qty 50

## 2014-12-18 NOTE — Progress Notes (Signed)
  Echocardiogram 2D Echocardiogram has been performed.  Kathryn Knapp 12/18/2014, 3:04 PM

## 2014-12-18 NOTE — Progress Notes (Addendum)
Initial Nutrition Assessment  DOCUMENTATION CODES:  Not applicable  INTERVENTION:  Initiation of nutrition support in next 24-48 hours if prolonged intubation/NPO status expected (TPN vs EN)  NUTRITION DIAGNOSIS:  Inadequate oral intake related to inability to eat as evidenced by NPO status.  GOAL:  Patient will meet greater than or equal to 90% of their needs  MONITOR:  Vent status, Weight trends, Labs, I & O's  REASON FOR ASSESSMENT:  Ventilator    ASSESSMENT: 74 year old with a remote history of HTN and diabetes who presents with a two-week history of lower abdominal pain. CT abdomen revealed large bowel obstruction and colonic mass.  Patient s/p procedure 5/1: EXPLORATORY LAPAROTOMY LEFT COLECTOMY PARTIAL GASTRECTOMY END ILEOSTOMY  RD unable to obtain nutrition hx at this time.  Noted pt with recent weight loss and eating poorly because of a decreased appetite PTA.  Patient is currently intubated on ventilator support -- NGT in place MV: 8.1 L/min Temp (24hrs), Avg:98.1 F (36.7 C), Min:95.9 F (35.5 C), Max:100 F (37.8 C)   Height:  Ht Readings from Last 1 Encounters:  12/17/14 5\' 9"  (1.753 m)    Weight:  Wt Readings from Last 1 Encounters:  12/18/14 171 lb 8.3 oz (77.8 kg)    Ideal Body Weight:  66 kg  Wt Readings from Last 10 Encounters:  12/18/14 171 lb 8.3 oz (77.8 kg)  04/14/13 198 lb (89.812 kg)  09/21/12 192 lb (87.091 kg)    BMI:  Body mass index is 25.32 kg/(m^2).  Estimated Nutritional Needs:  Kcal:  1640  Protein:  115-125 gm  Fluid:  per MD  Skin:  Reviewed, no issues  Diet Order:  Diet NPO time specified  EDUCATION NEEDS:  No education needs identified at this time   Intake/Output Summary (Last 24 hours) at 12/18/14 1333 Last data filed at 12/18/14 1200  Gross per 24 hour  Intake 9134.26 ml  Output    700 ml  Net 8434.26 ml    Last BM:  5/2  Arthur Holms, RD, LDN Pager #: [phone] After-Hours Pager  #: [phone]

## 2014-12-18 NOTE — Progress Notes (Signed)
[facility] PROGRESS NOTE Subjective Op findings noted. Unfortunately, perforated due to obtructing tumor at [facility] found to have gross extension of tumor into stomach and diaphragm. Extensive resection with end ileostomy. Pt septic entubated.  Objective: Vital signs in last 24 hours: Temp:  [95.9 F (35.5 C)-100 F (37.8 C)] 100 F (37.8 C) (05/02 0400) Pulse Rate:  [64-114] 102 (05/02 0645) Resp:  [0-30] 15 (05/02 0700) BP: (69-148)/(29-75) 103/48 mmHg (05/02 0700) SpO2:  [100 %] 100 % (05/02 0700) Arterial Line BP: (83-167)/(40-66) 116/45 mmHg (05/02 0700) FiO2 (%):  [40 %-50 %] 40 % (05/02 0700) Weight:  [71.1 kg (156 lb 12 oz)-77.8 kg (171 lb 8.3 oz)] 77.8 kg (171 lb 8.3 oz) (05/02 0327) Last BM Date: 12/16/14  Intake/Output from previous day: 05/01 0701 - 05/02 0700 In: 10675.6 [I.V.:6850.6; Blood:335; NG/GT:90; IV Piggyback:3400] Out: 930 [Urine:355; Drains:450; Blood:125] Intake/Output this shift:    PE: General--entubated non responsive   Lab Results:  Recent Labs  12/16/14 1004 12/17/14 0540 12/17/14 0735 12/17/14 0841 12/17/14 0845 12/17/14 1333 12/18/14 0325  WBC 14.5* 4.7  --   --   --  4.5 12.7*  HGB 9.7* 11.5* 10.5* 9.2* 9.5* 10.0* 10.5*  HCT 31.3* 36.8 31.0* 27.0* 28.0* 32.8* 32.1*  PLT 465* 553*  --   --   --  462* 217   BMET  Recent Labs  12/16/14 1004 12/16/14 1853 12/17/14 0540 12/17/14 0735 12/17/14 0841 12/17/14 0845 12/17/14 1333 12/17/14 1334 12/18/14 0325  NA 135 137 138 136 138 138  --  138 133*  K 2.3* 2.8* 4.3 4.3 3.7 3.7  --  3.5 3.9  CL 98 100 101  --   --   --   --  107 108  CO2 26 21 20*  --   --   --   --  20* 17*  CREATININE 0.89 0.99 1.20*  --   --   --  1.51* 1.53* 1.79*   LFT  Recent Labs  12/16/14 1004  PROT 6.3  AST 26  ALT 14  ALKPHOS 89  BILITOT 1.1   PT/INR  Recent Labs  12/16/14 1632  LABPROT 15.2  INR 1.19   PANCREAS  Recent Labs  12/16/14 1004  LIPASE 15          Studies/Results: Dg Chest 1 View  12/17/2014   CLINICAL DATA:  Intubated  EXAM: CHEST  1 VIEW  COMPARISON:  CT chest dated 12/17/2014  FINDINGS: Increased interstitial markings. Mild left basilar atelectasis. Trace left pleural effusion is not evident radiographically.  Endotracheal tube terminates 4.5 cm above the carina. Enteric tube courses into the stomach.  The heart is top-normal in size for  IMPRESSION: Endotracheal tube terminates 4.5 cm above the carina.   Electronically Signed   By: Julian Hy M.D.   On: 12/17/2014 10:57   Ct Chest W Contrast  12/17/2014   CLINICAL DATA:  Assess for metastases within the chest. Recently diagnosed colonic mass. Initial encounter.  EXAM: CT CHEST WITH CONTRAST  TECHNIQUE: Multidetector CT imaging of the chest was performed during intravenous contrast administration.  CONTRAST:  41mL OMNIPAQUE IOHEXOL 300 MG/ML  SOLN  COMPARISON:  CT of the abdomen and pelvis from 12/16/2014  FINDINGS: A small left pleural effusion is noted, new from the prior study, likely reactive secondary to the bowel perforation described below. Bibasilar atelectasis is noted. The lungs are otherwise clear. There is no evidence of pulmonary metastases. No pneumothorax is identified.  Scattered coronary  artery calcifications are seen. A small pericardial effusion is identified, similar in appearance to the prior study. No mediastinal lymphadenopathy is seen. Scattered calcification is noted along the aortic arch and proximal great vessels. The visualized portions of thyroid gland are unremarkable. No axillary lymphadenopathy is seen.  Note is made of a large amount of free air and free fluid within the upper abdomen, primarily about the liver, compatible with acute perforation. This likely reflects perforation at the level of the patient's large colonic malignancy, though the site of perforation is not definitely characterized on this study.  Fluid tracks about the spleen, and  air tracks about the hepatic hilum. The visualized portions of the liver and spleen are otherwise grossly unremarkable. The patient is status post cholecystectomy, with clips noted along the gallbladder fossa. A 2.5 cm left adrenal lesion again raises concern for metastasis.  The patient's large left splenic flexure colonic mass is again noted, with diffuse surrounding nodularity, reflecting local spread of disease. Extensive surrounding soft tissue inflammation is mildly more prominent than on the prior study.  Nonspecific perinephric stranding is noted bilaterally.  No acute osseous abnormalities are seen.  IMPRESSION: 1. Large amount of free air and free fluid within the upper abdomen follow-up rarely about the liver, compatible with acute perforation. This likely reflects perforation at the level of the large colonic malignancy, though the site of perforation is not definitely characterized on this study. 2. Large left splenic flexure colonic mass again noted, with diffuse surrounding nodularity, reflecting local spread of disease. Extensive surrounding soft tissue inflammation is mildly more prominent than on the prior study. 3. Small left pleural effusion, new from the recent prior study, likely reactive secondary to bowel perforation. Bibasilar atelectasis noted. Lungs otherwise clear. 4. Scattered coronary artery calcifications seen. 5. Small pericardial effusion again noted. 6. 2.5 cm left adrenal lesion again raises concern for metastasis.  Critical Value/emergent results were called by telephone at the time of interpretation on 12/17/2014 at 5:07 am to Dr. Ralene Ok, who verbally acknowledged these results.   Electronically Signed   By: Garald Balding M.D.   On: 12/17/2014 05:13   Ct Abdomen Pelvis W Contrast  12/16/2014   CLINICAL DATA:  Mid/lower abdominal pain x2 weeks, nausea/vomiting, diarrhea  EXAM: CT ABDOMEN AND PELVIS WITH CONTRAST  TECHNIQUE: Multidetector CT imaging of the abdomen and  pelvis was performed using the standard protocol following bolus administration of intravenous contrast.  CONTRAST:  131mL OMNIPAQUE IOHEXOL 300 MG/ML  SOLN  COMPARISON:  100 mL Omnipaque 300 IV  FINDINGS: Lower chest: Mild dependent atelectasis at the lung bases. Trace left pleural effusion.  Small pericardial effusion. Coronary atherosclerosis. Atherosclerotic calcifications of the aortic arch/bruit.  Coronary atherosclerosis in the distal esophagus, suggesting gastroesophageal reflux or esophageal dysmotility.  Hepatobiliary: Liver is within normal limits. No suspicious/ enhancing hepatic lesions.  Status post cholecystectomy. No intrahepatic ductal dilatation. Mild prominence of the common duct, likely postsurgical.  Pancreas: Within normal limits.  Spleen: Within normal limits.  Adrenals/Urinary Tract: 2.3 x 1.7 cm left adrenal nodule (series 21/ image 26), indeterminate, suspicious for metastasis given the associated findings.  Kidneys are within normal limits.  No hydronephrosis.  Bladder is underdistended.  Stomach/Bowel: Stomach is notable for a small hiatal hernia.  No evidence of small bowel obstruction.  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  Associated gross pericolonic extension  with associated pericolonic soft tissue implants/nodes, for example measuring 1.7 x 2.2 cm medially (series 21/image 28) and 2.1 x 2.6 cm laterally (series 21/ image 25).  Dilated mobile cecum, which is present in the left lower abdomen, measuring up to 11.1 cm (series 21/image 64).  Vascular/Lymphatic: Atherosclerotic calcifications of the abdominal aorta and branch vessels.  Associated mesenteric/ retroperitoneal nodes, including a 10 mm short axis node beneath the left renal vein (series 21/image 42) and a 12 mm short axis node anterior to the pancreatic tail (series 201/ image 29).   Reproductive: Uterus is unremarkable.  Bilateral ovaries are within normal limits.  Other: Associated small volume pelvic ascites.  No free air.  Musculoskeletal: Degenerative changes of the visualized thoracolumbar spine.  Grade 1 anterolisthesis of L4 on L5.  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  Associated gross pericolonic extension with pericolonic soft tissue implants/nodes, as described above.  Associated mesenteric/retroperitoneal nodal metastases. 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  Associated small volume pelvic ascites.  No free air.  Dilated mobile cecum in the left lower abdomen, measuring up to 11.1 cm.  Trace left pleural effusion.  Small pericardial effusion.   Electronically Signed   By: Julian Hy M.D.   On: 12/16/2014 13:46   Dg Chest Portable 1 View  12/18/2014   CLINICAL DATA:  Respiratory failure  EXAM: PORTABLE CHEST - 1 VIEW  COMPARISON:  12/17/2014  FINDINGS: The endotracheal tube tip is 3.9 cm above the carina. The nasogastric tube extends into the stomach and off the inferior edge of the image. The right jugular central line extends to the cavoatrial junction. There is basilar consolidation on the left with obscured diaphragmatic contour. There is a small left pleural effusion. The right lung is clear.  IMPRESSION: Support equipment appears satisfactorily positioned.  There is dense left base consolidation and small left pleural effusion.   Electronically Signed   By: Andreas Newport M.D.   On: 12/18/2014 06:06   Dg Chest Port 1 View  12/17/2014   CLINICAL DATA:  Central line placement.  EXAM: PORTABLE CHEST - 1 VIEW  COMPARISON:  12/17/2014  FINDINGS: Endotracheal tube is in place with tip 5.4 cm above carina. Right IJ central line tip overlies the level of the lower superior vena cava. Heart is enlarged. Suspect left pleural effusion versus artifact related to  positioning. No pneumothorax.  IMPRESSION: 1. Interval placement of right-sided IJ line, tip overlying the level of the lower superior vena cava. 2. No pneumothorax PE 3. Possible left pleural effusion.   Electronically Signed   By: Nolon Nations M.D.   On: 12/17/2014 14:56    Medications: I have reviewed the patient's current medications.  Assessment/Plan: 1. Extensive colon cancer with obstruction. Path and staging pending. Could need colonoscopy in future and please call Dr Michail Sermon if we can be of any help.   Yamato Kopf JR,Judia Arnott L 12/18/2014, 7:31 AM  Diet:  No Change Anticoagulation: Follow-up Appointment:

## 2014-12-18 NOTE — Progress Notes (Signed)
eLink Physician-Brief Progress Note Patient Name: Kathryn Knapp DOB: August 07, 1941 MRN: [id number]   Date of Service  12/18/2014  HPI/Events of Note  On high dose pressors Rn concerned pulses rt leg Had PA evaluate Pop only on rt and fem not distal Paging vascular  eICU Interventions       Intervention Category Major Interventions: Other:  Raylene Miyamoto. 12/18/2014, 10:50 PM

## 2014-12-18 NOTE — Progress Notes (Signed)
MD at bedside, per MD-no plans on extubation, placed back on full vent support per MD request.  PT tol well, RN aware.

## 2014-12-18 NOTE — Progress Notes (Signed)
[place] Progress Note Patient Name: Kathryn Knapp DOB: 1941-04-24 MRN: [id number]   Date of Service  12/18/2014  HPI/Events of Note  Patient with non-dopplerable DP and PT pulses on right lower extremity.  Patient is on NE gtt for BP support.  Doppler DP pulse on left.  Have discussed findings with Dr. Bridgett Larsson, VVS.  He states patient is to unstable for surgery tonight.  Also s/p extensive surgical procedure 5/1 so heparinization is a concern.  eICU Interventions  Plan: Continue with subq heparin Hold on systemic heparin Vascular surgery consult in AM     Intervention Category Major Interventions: Other:  Melat Wrisley 12/18/2014, 11:55 PM

## 2014-12-18 NOTE — Progress Notes (Signed)
1 Day Post-Op  Subjective: On vent, sedated, on pressors  Objective: Vital signs in last 24 hours: Temp:  [95.9 F (35.5 C)-100 F (37.8 C)] 97.4 F (36.3 C) (05/02 0746) Pulse Rate:  [64-114] 102 (05/02 0645) Resp:  [0-30] 15 (05/02 0700) BP: (69-148)/(29-75) 103/48 mmHg (05/02 0700) SpO2:  [100 %] 100 % (05/02 0700) Arterial Line BP: (83-167)/(40-66) 116/45 mmHg (05/02 0700) FiO2 (%):  [40 %-50 %] 40 % (05/02 0700) Weight:  [71.1 kg (156 lb 12 oz)-77.8 kg (171 lb 8.3 oz)] 77.8 kg (171 lb 8.3 oz) (05/02 0327) Last BM Date: 12/16/14  Intake/Output from previous day: 05/01 0701 - 05/02 0700 In: 10675.6 [I.V.:6850.6; Blood:335; NG/GT:90; IV Piggyback:3400] Out: 930 [Urine:355; Drains:450; Blood:125] Intake/Output this shift:    Abdomen soft Ostomy viable Drain serosang  Lab Results:   Recent Labs  12/17/14 1333 12/18/14 0325  WBC 4.5 12.7*  HGB 10.0* 10.5*  HCT 32.8* 32.1*  PLT 462* 217   BMET  Recent Labs  12/17/14 1334 12/18/14 0325  NA 138 133*  K 3.5 3.9  CL 107 108  CO2 20* 17*  GLUCOSE 168* 117*  BUN 21* 26*  CREATININE 1.53* 1.79*  CALCIUM 7.2* 6.6*   PT/INR  Recent Labs  12/16/14 1632  LABPROT 15.2  INR 1.19   ABG  Recent Labs  12/17/14 0841 12/17/14 1317  PHART 7.321* 7.298*  HCO3 19.3* 17.5*    Studies/Results: Dg Chest 1 View  12/17/2014   CLINICAL DATA:  Intubated  EXAM: CHEST  1 VIEW  COMPARISON:  CT chest dated 12/17/2014  FINDINGS: Increased interstitial markings. Mild left basilar atelectasis. Trace left pleural effusion is not evident radiographically.  Endotracheal tube terminates 4.5 cm above the carina. Enteric tube courses into the stomach.  The heart is top-normal in size for  IMPRESSION: Endotracheal tube terminates 4.5 cm above the carina.   Electronically Signed   By: Julian Hy M.D.   On: 12/17/2014 10:57   Ct Chest W Contrast  12/17/2014   CLINICAL DATA:  Assess for metastases within the chest. Recently  diagnosed colonic mass. Initial encounter.  EXAM: CT CHEST WITH CONTRAST  TECHNIQUE: Multidetector CT imaging of the chest was performed during intravenous contrast administration.  CONTRAST:  52mL OMNIPAQUE IOHEXOL 300 MG/ML  SOLN  COMPARISON:  CT of the abdomen and pelvis from 12/16/2014  FINDINGS: A small left pleural effusion is noted, new from the prior study, likely reactive secondary to the bowel perforation described below. Bibasilar atelectasis is noted. The lungs are otherwise clear. There is no evidence of pulmonary metastases. No pneumothorax is identified.  Scattered coronary artery calcifications are seen. A small pericardial effusion is identified, similar in appearance to the prior study. No mediastinal lymphadenopathy is seen. Scattered calcification is noted along the aortic arch and proximal great vessels. The visualized portions of thyroid gland are unremarkable. No axillary lymphadenopathy is seen.  Note is made of a large amount of free air and free fluid within the upper abdomen, primarily about the liver, compatible with acute perforation. This likely reflects perforation at the level of the patient's large colonic malignancy, though the site of perforation is not definitely characterized on this study.  Fluid tracks about the spleen, and air tracks about the hepatic hilum. The visualized portions of the liver and spleen are otherwise grossly unremarkable. The patient is status post cholecystectomy, with clips noted along the gallbladder fossa. A 2.5 cm left adrenal lesion again raises concern for metastasis.  The patient's  large left splenic flexure colonic mass is again noted, with diffuse surrounding nodularity, reflecting local spread of disease. Extensive surrounding soft tissue inflammation is mildly more prominent than on the prior study.  Nonspecific perinephric stranding is noted bilaterally.  No acute osseous abnormalities are seen.  IMPRESSION: 1. Large amount of free air and free  fluid within the upper abdomen follow-up rarely about the liver, compatible with acute perforation. This likely reflects perforation at the level of the large colonic malignancy, though the site of perforation is not definitely characterized on this study. 2. Large left splenic flexure colonic mass again noted, with diffuse surrounding nodularity, reflecting local spread of disease. Extensive surrounding soft tissue inflammation is mildly more prominent than on the prior study. 3. Small left pleural effusion, new from the recent prior study, likely reactive secondary to bowel perforation. Bibasilar atelectasis noted. Lungs otherwise clear. 4. Scattered coronary artery calcifications seen. 5. Small pericardial effusion again noted. 6. 2.5 cm left adrenal lesion again raises concern for metastasis.  Critical Value/emergent results were called by telephone at the time of interpretation on 12/17/2014 at 5:07 am to Dr. Ralene Ok, who verbally acknowledged these results.   Electronically Signed   By: Garald Balding M.D.   On: 12/17/2014 05:13   Ct Abdomen Pelvis W Contrast  12/16/2014   CLINICAL DATA:  Mid/lower abdominal pain x2 weeks, nausea/vomiting, diarrhea  EXAM: CT ABDOMEN AND PELVIS WITH CONTRAST  TECHNIQUE: Multidetector CT imaging of the abdomen and pelvis was performed using the standard protocol following bolus administration of intravenous contrast.  CONTRAST:  149mL OMNIPAQUE IOHEXOL 300 MG/ML  SOLN  COMPARISON:  100 mL Omnipaque 300 IV  FINDINGS: Lower chest: Mild dependent atelectasis at the lung bases. Trace left pleural effusion.  Small pericardial effusion. Coronary atherosclerosis. Atherosclerotic calcifications of the aortic arch/bruit.  Coronary atherosclerosis in the distal esophagus, suggesting gastroesophageal reflux or esophageal dysmotility.  Hepatobiliary: Liver is within normal limits. No suspicious/ enhancing hepatic lesions.  Status post cholecystectomy. No intrahepatic ductal  dilatation. Mild prominence of the common duct, likely postsurgical.  Pancreas: Within normal limits.  Spleen: Within normal limits.  Adrenals/Urinary Tract: 2.3 x 1.7 cm left adrenal nodule (series 21/ image 26), indeterminate, suspicious for metastasis given the associated findings.  Kidneys are within normal limits.  No hydronephrosis.  Bladder is underdistended.  Stomach/Bowel: Stomach is notable for a small hiatal hernia.  No evidence of small bowel obstruction.  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  Associated gross pericolonic extension with associated pericolonic soft tissue implants/nodes, for example measuring 1.7 x 2.2 cm medially (series 21/image 28) and 2.1 x 2.6 cm laterally (series 21/ image 25).  Dilated mobile cecum, which is present in the left lower abdomen, measuring up to 11.1 cm (series 21/image 64).  Vascular/Lymphatic: Atherosclerotic calcifications of the abdominal aorta and branch vessels.  Associated mesenteric/ retroperitoneal nodes, including a 10 mm short axis node beneath the left renal vein (series 21/image 42) and a 12 mm short axis node anterior to the pancreatic tail (series 201/ image 29).  Reproductive: Uterus is unremarkable.  Bilateral ovaries are within normal limits.  Other: Associated small volume pelvic ascites.  No free air.  Musculoskeletal: Degenerative changes of the visualized thoracolumbar spine.  Grade 1 anterolisthesis of L4 on L5.  IMPRESSION: Mid colonic obstruction on the basis of an apple core tumor at the distal  transverse colon/splenic flexure, measuring up to 8.4 cm, as above.  Associated gross pericolonic extension with pericolonic soft tissue implants/nodes, as described above.  Associated mesenteric/retroperitoneal nodal metastases. 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  Associated small volume pelvic ascites.  No free air.   Dilated mobile cecum in the left lower abdomen, measuring up to 11.1 cm.  Trace left pleural effusion.  Small pericardial effusion.   Electronically Signed   By: Julian Hy M.D.   On: 12/16/2014 13:46   Dg Chest Portable 1 View  12/18/2014   CLINICAL DATA:  Respiratory failure  EXAM: PORTABLE CHEST - 1 VIEW  COMPARISON:  12/17/2014  FINDINGS: The endotracheal tube tip is 3.9 cm above the carina. The nasogastric tube extends into the stomach and off the inferior edge of the image. The right jugular central line extends to the cavoatrial junction. There is basilar consolidation on the left with obscured diaphragmatic contour. There is a small left pleural effusion. The right lung is clear.  IMPRESSION: Support equipment appears satisfactorily positioned.  There is dense left base consolidation and small left pleural effusion.   Electronically Signed   By: Andreas Newport M.D.   On: 12/18/2014 06:06   Dg Chest Port 1 View  12/17/2014   CLINICAL DATA:  Central line placement.  EXAM: PORTABLE CHEST - 1 VIEW  COMPARISON:  12/17/2014  FINDINGS: Endotracheal tube is in place with tip 5.4 cm above carina. Right IJ central line tip overlies the level of the lower superior vena cava. Heart is enlarged. Suspect left pleural effusion versus artifact related to positioning. No pneumothorax.  IMPRESSION: 1. Interval placement of right-sided IJ line, tip overlying the level of the lower superior vena cava. 2. No pneumothorax PE 3. Possible left pleural effusion.   Electronically Signed   By: Nolon Nations M.D.   On: 12/17/2014 14:56    Anti-infectives: Anti-infectives    Start     Dose/Rate Route Frequency Ordered Stop   12/17/14 1100  piperacillin-tazobactam (ZOSYN) IVPB 3.375 g     3.375 g 12.5 mL/hr over 240 Minutes Intravenous Every 8 hours 12/17/14 0956     12/17/14 0600  cefoTEtan (CEFOTAN) 2 g in dextrose 5 % 50 mL IVPB     2 g 100 mL/hr over 30 Minutes Intravenous To Surgery 12/17/14 0542 12/17/14  0726      Assessment/Plan: s/p Procedure(s): EXPLORATORY LAPAROTOMY Right, Transverse, partial left colectomy with en bloc partial gastrectomy illeostomy (N/A)   Continue bowel rest, NG Care per CCM  Remains critically ill   LOS: 2 days    Kathryn Knapp A 12/18/2014

## 2014-12-18 NOTE — Progress Notes (Signed)
ANTIBIOTIC CONSULT NOTE - INITIAL  Pharmacy Consult for Fluconazole Indication: peritonitis  No Known Allergies  Patient Measurements: Height: 5\' 9"  (175.3 cm) Weight: 171 lb 8.3 oz (77.8 kg) IBW/kg (Calculated) : 66.2  Vital Signs: Temp: 97.4 F (36.3 C) (05/02 0746) Temp Source: Oral (05/02 0746) BP: 100/41 mmHg (05/02 1000) Pulse Rate: 106 (05/02 1015) Intake/Output from previous day: 05/01 0701 - 05/02 0700 In: 10675.6 [I.V.:6850.6; Blood:335; NG/GT:90; IV Piggyback:3400] Out: 940 [Urine:365; Drains:450; Blood:125] Intake/Output from this shift: Total I/O In: 1643.8 [I.V.:613.8; NG/GT:30; IV Piggyback:1000] Out: 10 [Urine:10]  Labs:  Recent Labs  12/17/14 0540  12/17/14 0845 12/17/14 1333 12/17/14 1334 12/18/14 0325  WBC 4.7  --   --  4.5  --  12.7*  HGB 11.5*  < > 9.5* 10.0*  --  10.5*  PLT 553*  --   --  462*  --  217  CREATININE 1.20*  --   --  1.51* 1.53* 1.79*  < > = values in this interval not displayed.  Medical History: Past Medical History  Diagnosis Date  . Diabetes mellitus without complication   . Hypertension    Assessment: 74yof with perforated bowel now POD #1 right, transverse, and partial left colectomy, partial gastrectomy, and ileostomy. She was started on zosyn to cover for abdominal infection but has been febrile to 100 with increasing leukocytosis so fluconazole will be added. Serum creatinine is trending up with CrCl ~ 42ml/min - borderline for 400mg  vs 200mg  dosing but favor the higher dose since she is in septic shock. Will adjust if renal function continues to worsen.   5/1 Zosyn>> 5/2 Fluconazole>> 5/1 blood cx x2>>ngtd  Goal of Therapy:  Appropriate dosing  Plan:  1) Fluconazole 400mg  IV q24 2) Watch renal function  Deboraha Sprang 12/18/2014,10:37 AM

## 2014-12-18 NOTE — Progress Notes (Signed)
Updated Dr Alva Garnet on pt status, CVP down to 6-7, UOP 0-63mLs/hr, increasing Levophed requirements to 15mcg/min. Orders received for another NS bolus. Will implement and continue to monitor.

## 2014-12-18 NOTE — Progress Notes (Signed)
PULMONARY / CRITICAL CARE MEDICINE   Name: Kathryn Knapp MRN: [id number] DOB: Jul 10, 1941    ADMISSION DATE:  12/16/2014 CONSULTATION DATE:  Alphonzo Grieve  REFERRING MD :  Grandville Silos   CHIEF COMPLAINT:  Vent management   INITIAL PRESENTATION:  74 yo female with hx HTN, DM presented initially 4/30 with abd pain, n/v and weight loss.  CT abdomen revealed large bowel obstruction and colonic mass.  She was prepped for colonoscopy for decompression prior to elective surgery.  However a CT chest to look for metastasis revealed large amount free air and pt was taken urgently to OR.   Large amount of stool noted with concern for significant peritonitis/ sepsis and pt was left intubated post op.  PCCM consulted to assist with vent management.    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 Associated mesenteric/retroperitoneal nodal metastases. 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  SIGNIFICANT EVENTS: 5/1 OR>> exp lap,  Partial colectomy with ileostomy r/t perforated bowel    SUBJECTIVE: aggressively resuscitated overnight, weaning this morning  VITAL SIGNS: Temp:  [95.9 F (35.5 C)-100 F (37.8 C)] 97.4 F (36.3 C) (05/02 0746) Pulse Rate:  [64-118] 106 (05/02 0930) Resp:  [0-30] 8 (05/02 0930) BP: (69-148)/(29-75) 101/44 mmHg (05/02 0900) SpO2:  [100 %] 100 % (05/02 0930) Arterial Line BP: (86-167)/(40-66) 113/43 mmHg (05/02 0930) FiO2 (%):  [40 %-50 %] 50 % (05/02 0930) Weight:  [171 lb 8.3 oz (77.8 kg)] 171 lb 8.3 oz (77.8 kg) (05/02 0327) HEMODYNAMICS: CVP:  [0 mmHg-12 mmHg] 7 mmHg VENTILATOR SETTINGS: Vent Mode:  [-] PSV FiO2 (%):  [40 %-50 %] 50 % Set Rate:  [15 bmp] 15 bmp Vt Set:  [530 mL] 530  mL PEEP:  [5 cmH20] 5 cmH20 Pressure Support:  [10 cmH20] 10 cmH20 Plateau Pressure:  [18 cmH20-19 cmH20] 18 cmH20 INTAKE / OUTPUT:  Intake/Output Summary (Last 24 hours) at 12/18/14 1019 Last data filed at 12/18/14 0900  Gross per 24 hour  Intake 8287.51 ml  Output    720 ml  Net 7567.51 ml    PHYSICAL EXAMINATION: Gen: sedated on vent HENT: NCAT, ETT in place Eyes: EOMi PULM: CTA B CV: Tachy, regular, S3 noted GI: BS infrequent, ileostomy Derm: surgical dressing C/D/I MSK: normal bulk and tone Neuro: sedated on vent, moves all four ext spontaneously  LABS:  CBC  Recent Labs Lab 12/17/14 0540  12/17/14 0845 12/17/14 1333 12/18/14 0325  WBC 4.7  --   --  4.5 12.7*  HGB 11.5*  < > 9.5* 10.0* 10.5*  HCT 36.8  < > 28.0* 32.8* 32.1*  PLT 553*  --   --  462* 217  < > = values in this interval not displayed. Coag's  Recent Labs Lab 12/16/14 1632  INR 1.19   BMET  Recent Labs Lab 12/17/14 0540  12/17/14 0845 12/17/14 1333 12/17/14 1334 12/18/14 0325  NA 138  < > 138  --  138 133*  K 4.3  < > 3.7  --  3.5 3.9  CL 101  --   --   --  107 108  CO2 20*  --   --   --  20* 17*  BUN 19  --   --   --  21* 26*  CREATININE 1.20*  --   --  1.51* 1.53* 1.79*  GLUCOSE 95  --  120*  --  168* 117*  < > = values in this interval not displayed. Electrolytes  Recent Labs Lab 12/16/14 1632  12/17/14 0540 12/17/14 1334 12/18/14 0325  CALCIUM  --   < > 8.1* 7.2* 6.6*  MG 1.8  --   --   --   --   < > = values in this interval not displayed. Sepsis Markers  Recent Labs Lab 12/17/14 1511 12/17/14 1912 12/17/14 2210  LATICACIDVEN 4.6* 3.8* 2.6*   ABG  Recent Labs Lab 12/17/14 0735 12/17/14 0841 12/17/14 1317  PHART 7.360 7.321* 7.298*  PCO2ART 41.0 37.3 35.4  PO2ART 363.0* 162.0* 138.0*   Liver Enzymes  Recent Labs Lab 12/16/14 1004  AST 26  ALT 14  ALKPHOS 89  BILITOT 1.1  ALBUMIN 2.7*   Cardiac Enzymes No results for input(s): TROPONINI,  PROBNP in the last 168 hours. Glucose  Recent Labs Lab 12/17/14 1637 12/17/14 1924 12/18/14 0001 12/18/14 0354 12/18/14 0744 12/18/14 0825  GLUCAP 205* 179* 131* 113* 57* 152*    Imaging CXR images personally reviewed. ETT in good position, R IJ in place, lungs clear   ASSESSMENT / PLAN:  PULMONARY OETT 5/1>>> Acute respiratory failure - post op exp lap for perf bowel with sepsis  P:   Continue full vent support CXR in AM Daily SBT > no extubation 5/2 due to high levophed requirements  CARDIOVASCULAR Septic shock> now resuscitated but still in profound shock, question cardiogenic given gallop on exam Hx HTN  P:  Titrate levophed to MAP > 60 Check echo, ekg, troponin now Bolus saline now tele  RENAL AKI > worsening 5/2 Metabolic acidosis  P:   Repeat ABG now F/u chem  Gentle volume   GASTROINTESTINAL Peritonitis > Perforation of cecum - s/p exp lap 5/1 Large obstructing colonic mass - s/p partial removal.  Likely malignant with metastasis  S/p partial L colectomy with ileostomy  P:   Surgery following  NPO  OG to suction  Wound care  PPI   HEMATOLOGIC No active issue  P:  SQ heparin -ok with surgery  F/u CBC   INFECTIOUS Peritonitis causing septic shock P:   BCx2 5/1>>> UC 5/1>>>  Zosyn 5/1>>> Add Fluconazole 5/2 >>   ENDOCRINE History of DM2 per notes  P:   Continue POC accuchecks and SSI  NEUROLOGIC Sedation  P:   RASS goal: -1 Fentanyl gtt  PRN versed  Daily WUA   FAMILY  - Updates:  Family Regina updated by phone on [place] Time devoted to patient care services described in this note is  40  Minutes. This time reflects time of care of this signee Dr Jennet Maduro. This critical care time does not reflect procedure time, or teaching time or supervisory time of PA/NP/Med student/Med Resident etc but could involve care discussion time.  Roselie Awkward, MD [place] [facility] Pager: [phone] Cell: [phone] If no  response, call [phone]

## 2014-12-18 NOTE — Progress Notes (Signed)
Pt placed on SBT.  Increased psv to 10 d/t low spont VE, increased FIO2 to 50 % sat 88-90%.  RN aware pt weaning.

## 2014-12-19 ENCOUNTER — Inpatient Hospital Stay (HOSPITAL_COMMUNITY): Payer: Medicare Other

## 2014-12-19 DIAGNOSIS — C784 Secondary malignant neoplasm of small intestine: Secondary | ICD-10-CM

## 2014-12-19 DIAGNOSIS — I998 Other disorder of circulatory system: Secondary | ICD-10-CM

## 2014-12-19 DIAGNOSIS — C189 Malignant neoplasm of colon, unspecified: Secondary | ICD-10-CM

## 2014-12-19 LAB — GLUCOSE, CAPILLARY
Glucose-Capillary: 110 mg/dL — ABNORMAL HIGH (ref 70–99)
Glucose-Capillary: 141 mg/dL — ABNORMAL HIGH (ref 70–99)
Glucose-Capillary: 150 mg/dL — ABNORMAL HIGH (ref 70–99)
Glucose-Capillary: 154 mg/dL — ABNORMAL HIGH (ref 70–99)
Glucose-Capillary: 161 mg/dL — ABNORMAL HIGH (ref 70–99)
Glucose-Capillary: 167 mg/dL — ABNORMAL HIGH (ref 70–99)
Glucose-Capillary: 99 mg/dL (ref 70–99)

## 2014-12-19 LAB — BASIC METABOLIC PANEL
Anion gap: 11 (ref 5–15)
BUN: 32 mg/dL — ABNORMAL HIGH (ref 6–20)
CALCIUM: 6.9 mg/dL — AB (ref 8.9–10.3)
CO2: 15 mmol/L — AB (ref 22–32)
CREATININE: 2.23 mg/dL — AB (ref 0.44–1.00)
Chloride: 106 mmol/L (ref 101–111)
GFR calc Af Amer: 24 mL/min — ABNORMAL LOW (ref >60)
GFR calc non Af Amer: 21 mL/min — ABNORMAL LOW (ref >60)
Glucose, Bld: 162 mg/dL — ABNORMAL HIGH (ref 70–99)
Potassium: 4.3 mmol/L (ref 3.5–5.1)
Sodium: 132 mmol/L — ABNORMAL LOW (ref 135–145)

## 2014-12-19 LAB — CBC
HCT: 29.4 % — ABNORMAL LOW (ref 36.0–46.0)
HEMOGLOBIN: 9.3 g/dL — AB (ref 12.0–15.0)
MCH: 24.8 pg — ABNORMAL LOW (ref 26.0–34.0)
MCHC: 31.6 g/dL (ref 30.0–36.0)
MCV: 78.4 fL (ref 78.0–100.0)
Platelets: 158 10*3/uL (ref 150–400)
RBC: 3.75 MIL/uL — AB (ref 3.87–5.11)
RDW: 16.2 % — AB (ref 11.5–15.5)
WBC: 20.5 10*3/uL — ABNORMAL HIGH (ref 4.0–10.5)

## 2014-12-19 LAB — METANEPHRINES, PLASMA
METANEPHRINE FREE: 60 pg/mL (ref 0–62)
Normetanephrine, Free: 571 pg/mL — ABNORMAL HIGH (ref 0–145)

## 2014-12-19 MED ORDER — HYDROCORTISONE NA SUCCINATE PF 100 MG IJ SOLR
50.0000 mg | Freq: Four times a day (QID) | INTRAMUSCULAR | Status: DC
  Administered 2014-12-19 (×3): 50 mg via INTRAVENOUS
  Filled 2014-12-19 (×4): qty 1

## 2014-12-19 MED ORDER — SODIUM CHLORIDE 0.9 % IV SOLN
0.0300 [IU]/min | INTRAVENOUS | Status: DC
  Administered 2014-12-19: 0.03 [IU]/min via INTRAVENOUS
  Filled 2014-12-19: qty 2

## 2014-12-19 MED ORDER — DOPAMINE-DEXTROSE 3.2-5 MG/ML-% IV SOLN
5.0000 ug/kg/min | INTRAVENOUS | Status: DC
  Administered 2014-12-19: 5 ug/kg/min via INTRAVENOUS
  Filled 2014-12-19: qty 250

## 2014-12-19 MED ORDER — SODIUM CHLORIDE 0.9 % IV BOLUS (SEPSIS)
500.0000 mL | Freq: Once | INTRAVENOUS | Status: AC
  Administered 2014-12-19: 500 mL via INTRAVENOUS

## 2014-12-19 MED ORDER — FLUCONAZOLE IN SODIUM CHLORIDE 200-0.9 MG/100ML-% IV SOLN
200.0000 mg | INTRAVENOUS | Status: AC
  Administered 2014-12-19 – 2014-12-23 (×5): 200 mg via INTRAVENOUS
  Filled 2014-12-19 (×5): qty 100

## 2014-12-19 MED ORDER — DEXTROSE 5 % IV SOLN
INTRAVENOUS | Status: DC
  Administered 2014-12-19 – 2014-12-21 (×5): via INTRAVENOUS
  Filled 2014-12-19 (×7): qty 150

## 2014-12-19 NOTE — Progress Notes (Signed)
[place] [place] Progress Note Patient Name: QUATISHA ZYLKA DOB: March 25, 1941 MRN: [id number]   Date of Service  12/19/2014  HPI/Events of Note  Loletha Grayer now  eICU Interventions  Add dop Get 12 lead , assess pr, block?     Intervention Category Major Interventions: Arrhythmia - evaluation and management  Tyrie Porzio J. 12/19/2014, 9:47 PM

## 2014-12-19 NOTE — Consult Note (Signed)
Referred by:  PCCM  Reason for referral: B leg ischemia  History of Present Illness  Kathryn Knapp is a 74 y.o. (03-27-1941) female who presents with chief complaint: free air.  History is obtained from chart as patient is intubated and sedated.  Pt was undergoing work-up for colonoscopy for concerns of colonic mass.  She was found to have free air on a CT chest and was taken to OR on 12/17/14.  Intraoperatively, perforated cecum with large obstructing tumor at splenic flexure with invasion of adjacent viscera was noted.  There was frank contamination of the abdominal cavity due to the perforationsThe patient underwent Right, transverse, partial left colectomy, partial gastrectomy, and end ileostomy.  The patient has been in shock, requiring vasopressors.  Last night, loss of peripheral pulses in the feet was noted by the PCCM service. No peripheral pulse exam is document in the admission H&P.  The patient risk factors for atherosclerosis included: DM and HTN.  Past Medical History  Diagnosis Date  . Diabetes mellitus without complication   . Hypertension     Past Surgical History  Procedure Laterality Date  . Cholecystectomy    . Abdominal hysterectomy    . Laparotomy N/A 12/17/2014    Procedure: EXPLORATORY LAPAROTOMY Right, Transverse, partial left colectomy with en bloc partial gastrectomy illeostomy;  Surgeon: Georganna Skeans, MD;  Location: [facility];  Service: General;  Laterality: N/A;    History   Social History  . Marital Status: Divorced    Spouse Name: N/A  . Number of Children: N/A  . Years of Education: N/A   Occupational History  . Not on file.   Social History Main Topics  . Smoking status: Never Smoker   . Smokeless tobacco: Not on file  . Alcohol Use: No  . Drug Use: No  . Sexual Activity: Not on file   Other Topics Concern  . Not on file   Social History Narrative    Family History cannot be obtained as the patient is intubated and sedated   Current  Facility-Administered Medications  Medication Dose Route Frequency Provider Last Rate Last Dose  . 0.9 %  sodium chloride infusion   Intravenous Continuous Wilford Corner, MD 20 mL/hr at 12/19/14 0400    . albuterol (PROVENTIL) (2.5 MG/3ML) 0.083% nebulizer solution 2.5 mg  2.5 mg Nebulization Q4H PRN Marijean Heath, NP      . antiseptic oral rinse (CPC / CETYLPYRIDINIUM CHLORIDE 0.05%) solution 7 mL  7 mL Mouth Rinse QID Georganna Skeans, MD   7 mL at 12/19/14 0400  . chlorhexidine (PERIDEX) 0.12 % solution 15 mL  15 mL Mouth Rinse BID Georganna Skeans, MD   15 mL at 12/19/14 0817  . fentaNYL (SUBLIMAZE) 2,500 mcg in sodium chloride 0.9 % 250 mL (10 mcg/mL) infusion  25-400 mcg/hr Intravenous Continuous Marijean Heath, NP 5 mL/hr at 12/19/14 1000 50 mcg/hr at 12/19/14 1000  . fentaNYL (SUBLIMAZE) bolus via infusion 25 mcg  25 mcg Intravenous Q1H PRN Marijean Heath, NP      . fluconazole (DIFLUCAN) IVPB 200 mg  200 mg Intravenous Q24H Otilio Miu, RPH      . heparin injection 5,000 Units  5,000 Units Subcutaneous 3 times per day Georganna Skeans, MD   5,000 Units at 12/19/14 0524  . hydrocortisone sodium succinate (SOLU-CORTEF) 100 MG injection 50 mg  50 mg Intravenous Q6H Juanito Doom, MD   50 mg at 12/19/14 1016  . insulin aspart (  novoLOG) injection 2-6 Units  2-6 Units Subcutaneous 6 times per day Elsie Stain, MD   4 Units at 12/19/14 0440  . midazolam (VERSED) injection 1 mg  1 mg Intravenous Q2H PRN Marijean Heath, NP   1 mg at 12/19/14 0244  . norepinephrine (LEVOPHED) 16 mg in dextrose 5 % 250 mL (0.064 mg/mL) infusion  2-50 mcg/min Intravenous Continuous Rush Farmer, MD 18.8 mL/hr at 12/19/14 1000 20 mcg/min at 12/19/14 1000  . pantoprazole (PROTONIX) injection 40 mg  40 mg Intravenous Q24H Marijean Heath, NP   40 mg at 12/18/14 1200  . piperacillin-tazobactam (ZOSYN) IVPB 3.375 g  3.375 g Intravenous Q8H Georganna Skeans, MD   3.375 g at 12/19/14  2831  . promethazine (PHENERGAN) injection 6.25-12.5 mg  6.25-12.5 mg Intravenous Q15 min PRN Rica Koyanagi, MD      . sodium bicarbonate 150 mEq in dextrose 5 % 1,000 mL infusion   Intravenous Continuous Juanito Doom, MD      . vasopressin (PITRESSIN) 40 Units in sodium chloride 0.9 % 250 mL (0.16 Units/mL) infusion  0.03 Units/min Intravenous Continuous Juanito Doom, MD 11.3 mL/hr at 12/19/14 1012 0.03 Units/min at 12/19/14 1012     No Known Allergies   REVIEW OF SYSTEMS:  (Positives checked otherwise negative)  Cannot be obtained due to patient intubation and sedation   Physical Examination  Filed Vitals:   12/19/14 1000 12/19/14 1015 12/19/14 1030 12/19/14 1045  BP: 96/38     Pulse: 79 79 81 72  Temp:      TempSrc:      Resp: 15 15 15 15   Height:      Weight:      SpO2: 100% 100% 100% 100%    Body mass index is 26.16 kg/(m^2).  General: intubated, sedated  Head: /AT  Ear/Nose/Throat: intubated, no obvious nasopharyngeal drainage  Eyes: PERRL, unable to test movement  Neck: Supple, no nuchal rigidity, no palpable LAD  Pulmonary: Sym exp, Bilateral rales, no rhonchi, & wheezing, on vent Vent Mode:  [-] PRVC FiO2 (%):  [40 %-50 %] 40 % Set Rate:  [15 bmp] 15 bmp Vt Set:  [530 mL] 530 mL PEEP:  [5 cmH20] 5 cmH20 Plateau Pressure:  [16 cmH20-20 cmH20] 18 cmH20 ABG    Component Value Date/Time   PHART 7.240* 12/18/2014 1143   PCO2ART 36.7 12/18/2014 1143   PO2ART 156.0* 12/18/2014 1143   HCO3 15.7* 12/18/2014 1143   TCO2 17 12/18/2014 1143   ACIDBASEDEF 11.0* 12/18/2014 1143   O2SAT 99.0 12/18/2014 1143    Cardiac: RRR, Nl S1, S2, no Murmurs, rubs or gallops; drips: Vasopressin 0.03 U/min, NE 20 mcg/min  Vascular: Vessel Right Left  Radial Palpable Palpable  Ulnar Not Palpable Not Palpable  Brachial Palpable Palpable  Carotid Palpable, without bruit Palpable, without bruit  Aorta Not palpable N/A  Femoral Palpable Palpable  Popliteal Not  palpable Not palpable  PT Not Palpable Not Palpable  DP Not Palpable Not Palpable   Gastrointestinal: bandaged, no grimace to palpation, viable ileostomy, -G/R  Musculoskeletal: cannot be tested due to sedation, B feet pallorous with some cyanosis, R colder than L, no rigor mortis  Neurologic: cannot be tested due to sedation, Fentanyl 100 mcg/hr  Psychiatric: cannot be tested due to sedation  Dermatologic: See M/S exam for extremity exam, no rashes otherwise noted  Lymph : No Cervical, Axillary, or Inguinal lymphadenopathy    Laboratory: CBC:    Component Value Date/Time  WBC 20.5* 12/19/2014 0330   RBC 3.75* 12/19/2014 0330   RBC 4.00 12/16/2014 1632   HGB 9.3* 12/19/2014 0330   HCT 29.4* 12/19/2014 0330   PLT 158 12/19/2014 0330   MCV 78.4 12/19/2014 0330   MCH 24.8* 12/19/2014 0330   MCHC 31.6 12/19/2014 0330   RDW 16.2* 12/19/2014 0330   LYMPHSABS 0.5* 12/16/2014 1004   MONOABS 0.4 12/16/2014 1004   EOSABS 0.0 12/16/2014 1004   BASOSABS 0.0 12/16/2014 1004    BMP:    Component Value Date/Time   NA 132* 12/19/2014 0330   K 4.3 12/19/2014 0330   CL 106 12/19/2014 0330   CO2 15* 12/19/2014 0330   GLUCOSE 162* 12/19/2014 0330   BUN 32* 12/19/2014 0330   CREATININE 2.23* 12/19/2014 0330   CALCIUM 6.9* 12/19/2014 0330   GFRNONAA 21* 12/19/2014 0330   GFRAA 24* 12/19/2014 0330    Coagulation: Lab Results  Component Value Date   INR 1.19 12/16/2014   No results found for: PTT  Radiology: Dg Chest Port 1 View  12/19/2014   CLINICAL DATA:  Acute respiratory failure with hypoxia.  EXAM: PORTABLE CHEST - 1 VIEW  COMPARISON:  12/18/2014.  FINDINGS: Unchanged support tubes and lines. LEFT lower lobe atelectasis with pleural effusion, possible superimposed infiltrate, stable decreased lung volumes.  IMPRESSION: Stable chest.   Electronically Signed   By: Rolla Flatten M.D.   On: 12/19/2014 07:10   Dg Chest Portable 1 View  12/18/2014   CLINICAL DATA:  Respiratory  failure  EXAM: PORTABLE CHEST - 1 VIEW  COMPARISON:  12/17/2014  FINDINGS: The endotracheal tube tip is 3.9 cm above the carina. The nasogastric tube extends into the stomach and off the inferior edge of the image. The right jugular central line extends to the cavoatrial junction. There is basilar consolidation on the left with obscured diaphragmatic contour. There is a small left pleural effusion. The right lung is clear.  IMPRESSION: Support equipment appears satisfactorily positioned.  There is dense left base consolidation and small left pleural effusion.   Electronically Signed   By: Andreas Newport M.D.   On: 12/18/2014 06:06   Dg Chest Port 1 View  12/17/2014   CLINICAL DATA:  Central line placement.  EXAM: PORTABLE CHEST - 1 VIEW  COMPARISON:  12/17/2014  FINDINGS: Endotracheal tube is in place with tip 5.4 cm above carina. Right IJ central line tip overlies the level of the lower superior vena cava. Heart is enlarged. Suspect left pleural effusion versus artifact related to positioning. No pneumothorax.  IMPRESSION: 1. Interval placement of right-sided IJ line, tip overlying the level of the lower superior vena cava. 2. No pneumothorax PE 3. Possible left pleural effusion.   Electronically Signed   By: Nolon Nations M.D.   On: 12/17/2014 14:56     Medical Decision Making  Kathryn Knapp is a 74 y.o. female who presents with: B leg ischemia, s/p Right, transverse, partial left colectomy, partial gastrectomy, and end ileostomy for perforated cecum with large malignant splenic flexure mass invading stomach and diaphragm, Septic vs cardiac shock   Intraoperative findings would stage this patient at Stage IV with wide metastasis suggested by imaging studies.  From the big picture viewpoint, the leg ischemia is a small foot note.   I suspect this is likely chronic PAD in the setting of vasoconstriction from significant vasopressor needs to support blood pressure.  Patient is not stable enough  for any invasive diagnostic studies, i.e. Angiography,  much less any interventions.  If the patient survives her colon perforation, pt will need further investigation and/or interventions including angiography, amputation or revascularization depending her peripheral status upon recovery.  Meanwhile, ABI and B arterial duplex may provide additional insight to her baseline disease status.  Available as needed.  Thank you for allowing [place] to participate in this patient's care.  Adele Barthel, MD [facility] Office: [phone] Pager: [phone]  12/19/2014, 11:13 AM

## 2014-12-19 NOTE — Progress Notes (Signed)
PULMONARY / CRITICAL CARE MEDICINE   Name: Kathryn Knapp MRN: [id number] DOB: April 22, 1941    ADMISSION DATE:  12/16/2014 CONSULTATION DATE:  Alphonzo Grieve  REFERRING MD :  Grandville Silos   CHIEF COMPLAINT:  Vent management   INITIAL PRESENTATION:  74 yo female with hx HTN, DM presented initially 4/30 with abd pain, n/v and weight loss.  CT abdomen revealed large bowel obstruction and colonic mass.  She was prepped for colonoscopy for decompression prior to elective surgery.  However a CT chest to look for metastasis revealed large amount free air and pt was taken urgently to OR.   Large amount of stool noted with concern for significant peritonitis/ sepsis and pt was left intubated post op.  PCCM consulted to assist with vent management.    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 Associated mesenteric/retroperitoneal nodal metastases. 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 5/2 Echo> LVH, LVEF 55%, normal RV size and function, trace pericardial effusion  SIGNIFICANT EVENTS: 5/1 OR>> exp lap,  Partial colectomy with ileostomy r/t perforated bowel    SUBJECTIVE: cyanotic R foot developed overnight, UOP poor  VITAL SIGNS: Temp:  [97 F (36.1 C)-99 F (37.2 C)] 98.9 F (37.2 C) (05/03 0759) Pulse Rate:  [54-106] 78 (05/03 0900) Resp:  [9-22] 15 (05/03 0900) BP: (99-136)/(32-105) 136/105 mmHg (05/03 0900) SpO2:  [82 %-100 %] 100 % (05/03 0900) Arterial Line BP: (106-163)/(37-57) 107/37 mmHg (05/03 0900) FiO2 (%):  [40 %-50 %] 40 % (05/03 0730) Weight:  [177 lb 4 oz (80.4 kg)] 177 lb 4 oz (80.4 kg) (05/03 0300) HEMODYNAMICS: CVP:  [4 mmHg-18 mmHg] 6 mmHg VENTILATOR SETTINGS: Vent Mode:  [-] PRVC FiO2 (%):   [40 %-50 %] 40 % Set Rate:  [15 bmp] 15 bmp Vt Set:  [530 mL] 530 mL PEEP:  [5 cmH20] 5 cmH20 Plateau Pressure:  [16 cmH20-20 cmH20] 20 cmH20 INTAKE / OUTPUT:  Intake/Output Summary (Last 24 hours) at 12/19/14 1002 Last data filed at 12/19/14 0900  Gross per 24 hour  Intake 4477.57 ml  Output    550 ml  Net 3927.57 ml    PHYSICAL EXAMINATION: Gen: sedated on vent HENT: NCAT, ETT in place Eyes: EOMi PULM: CTA B CV: Tachy, regular, no mgr GI: BS infrequent, ileostomy Derm: surgical dressing C/D/I MSK:R foot> cyanotic sole, cool to touch Neuro: sedated on vent, moves all four ext spontaneously  LABS:  CBC  Recent Labs Lab 12/17/14 1333 12/18/14 0325 12/19/14 0330  WBC 4.5 12.7* 20.5*  HGB 10.0* 10.5* 9.3*  HCT 32.8* 32.1* 29.4*  PLT 462* 217 158   Coag's  Recent Labs Lab 12/16/14 1632  INR 1.19   BMET  Recent Labs Lab 12/18/14 0325 12/18/14 1038 12/19/14 0330  NA 133* 136 132*  K 3.9 4.2 4.3  CL 108 111 106  CO2 17* 14* 15*  BUN 26* 27* 32*  CREATININE 1.79* 1.94* 2.23*  GLUCOSE 117* 136* 162*   Electrolytes  Recent Labs Lab 12/16/14 1632  12/18/14 0325 12/18/14 1038 12/19/14 0330  CALCIUM  --   < > 6.6* 6.6* 6.9*  MG 1.8  --   --   --   --   < > = values in this  interval not displayed. Sepsis Markers  Recent Labs Lab 12/17/14 1511 12/17/14 1912 12/17/14 2210  LATICACIDVEN 4.6* 3.8* 2.6*   ABG  Recent Labs Lab 12/17/14 0841 12/17/14 1317 12/18/14 1143  PHART 7.321* 7.298* 7.240*  PCO2ART 37.3 35.4 36.7  PO2ART 162.0* 138.0* 156.0*   Liver Enzymes  Recent Labs Lab 12/16/14 1004  AST 26  ALT 14  ALKPHOS 89  BILITOT 1.1  ALBUMIN 2.7*   Cardiac Enzymes  Recent Labs Lab 12/18/14 1038  TROPONINI <0.03   Glucose  Recent Labs Lab 12/18/14 1154 12/18/14 1530 12/18/14 1921 12/18/14 2333 12/19/14 0336 12/19/14 0754  GLUCAP 70 99 120* 161* 167* 99    Imaging CXR images personally reviewed. ETT in good  position, R IJ in place, lungs clear   ASSESSMENT / PLAN:  PULMONARY OETT 5/1>>> Acute respiratory failure - post op exp lap for perf bowel with sepsis; no plans for extubation given severe shock P:   Continue full vent support CXR in AM Hold weaning attempt today  CARDIOVASCULAR Septic shock> now resuscitated but still in profound shock, question cardiogenic given gallop on exam Hx HTN  Acute ischemia to R foot > most likely due to high pressor requirement P:  ABI pending, appreciate vascular surgery consult Will add hydrocortisone to try to reduce dose of levophed Titrate levophed to MAP > 60 Bolus saline 500 cc now tele  RENAL AKI > worsening 5/3 Metabolic acidosis worse 5/3 (NAGMA in setting of AKI) P:   Change fluids to BICARB gtt Bolus saline now F/u chem   GASTROINTESTINAL Peritonitis > Perforation of cecum - s/p exp lap 5/1 Large obstructing colonic mass - s/p partial removal.  Likely malignant with metastasis  S/p partial L colectomy with ileostomy  Metastatic colon cancer P:   Surgery following  NPO  OG to suction  Wound care  PPI   HEMATOLOGIC No active issue  P:  F/u CBC   INFECTIOUS Peritonitis causing septic shock P:   BCx2 5/1>>> UC 5/1>>>  Zosyn 5/1>>> Fluconazole 5/2 >>   ENDOCRINE History of DM2 per notes  P:   Continue POC accuchecks and SSI  NEUROLOGIC Sedation  P:   RASS goal: -1 Fentanyl gtt  PRN versed  Daily WUA   FAMILY  - Updates:  Family Regina updated bedside 5/3> she has persistent septic shock with multi-organ failure and now ischemia of her R foot as a consequence of all this.  This is in the setting of metastatic colon cancer.  I explained to her family that the prognosis is becoming more grim each day and that she would not do well with prolonged critical illness considering the metastatic colon cancer.  They voiced understatnding.  Changed CODE STATUS to LIMITED CODE BLUE> no CPR or shocks, no drugs in setting  of cardiac arrest.  Will continue efforts for another 24 hours, changes fluids around as noted above.  May withdraw care 5/4 if no improvement   My CC time is 40 minutes  Roselie Awkward, MD [facility] Pager: [phone] Cell: [phone] If no response, call [phone]

## 2014-12-19 NOTE — Progress Notes (Signed)
   Daily Progress Note  Saw pt earlier for BLE ischemia.  In the setting of septic shock from ruptured viscera, pt is not a candidate for any vascular surgical interventions.  Obtain ABI and BLE arterial duplex for now.  Full consult to follow.   Adele Barthel, MD [facility] Office: [phone] Pager: [phone]  12/19/2014, 9:40 AM

## 2014-12-19 NOTE — Progress Notes (Signed)
2 Days Post-Op  Subjective: Remains intubated, sedated, and on pressors  Objective: Vital signs in last 24 hours: Temp:  [97 F (36.1 C)-99 F (37.2 C)] 97.4 F (36.3 C) (05/03 0400) Pulse Rate:  [54-118] 80 (05/03 0730) Resp:  [8-22] 15 (05/03 0730) BP: (99-133)/(32-88) 131/41 mmHg (05/03 0730) SpO2:  [96 %-100 %] 100 % (05/03 0730) Arterial Line BP: (105-148)/(39-55) 123/39 mmHg (05/03 0600) FiO2 (%):  [40 %-50 %] 40 % (05/03 0730) Weight:  [80.4 kg (177 lb 4 oz)] 80.4 kg (177 lb 4 oz) (05/03 0300) Last BM Date: 12/16/14  Intake/Output from previous day: 05/02 0701 - 05/03 0700 In: 4795.7 [I.V.:3265.7; NG/GT:180; IV Piggyback:1350] Out: 565 [Urine:250; Emesis/NG output:50; Drains:90; Stool:175] Intake/Output this shift:    Abdomen soft Ostomy pink, productive Drain in LUQ serous  Lab Results:   Recent Labs  12/18/14 0325 12/19/14 0330  WBC 12.7* 20.5*  HGB 10.5* 9.3*  HCT 32.1* 29.4*  PLT 217 158   BMET  Recent Labs  12/18/14 1038 12/19/14 0330  NA 136 132*  K 4.2 4.3  CL 111 106  CO2 14* 15*  GLUCOSE 136* 162*  BUN 27* 32*  CREATININE 1.94* 2.23*  CALCIUM 6.6* 6.9*   PT/INR  Recent Labs  12/16/14 1632  LABPROT 15.2  INR 1.19   ABG  Recent Labs  12/17/14 1317 12/18/14 1143  PHART 7.298* 7.240*  HCO3 17.5* 15.7*    Studies/Results: Dg Chest 1 View  12/17/2014   CLINICAL DATA:  Intubated  EXAM: CHEST  1 VIEW  COMPARISON:  CT chest dated 12/17/2014  FINDINGS: Increased interstitial markings. Mild left basilar atelectasis. Trace left pleural effusion is not evident radiographically.  Endotracheal tube terminates 4.5 cm above the carina. Enteric tube courses into the stomach.  The heart is top-normal in size for  IMPRESSION: Endotracheal tube terminates 4.5 cm above the carina.   Electronically Signed   By: Julian Hy M.D.   On: 12/17/2014 10:57   Dg Chest Port 1 View  12/19/2014   CLINICAL DATA:  Acute respiratory failure with hypoxia.   EXAM: PORTABLE CHEST - 1 VIEW  COMPARISON:  12/18/2014.  FINDINGS: Unchanged support tubes and lines. LEFT lower lobe atelectasis with pleural effusion, possible superimposed infiltrate, stable decreased lung volumes.  IMPRESSION: Stable chest.   Electronically Signed   By: Rolla Flatten M.D.   On: 12/19/2014 07:10   Dg Chest Portable 1 View  12/18/2014   CLINICAL DATA:  Respiratory failure  EXAM: PORTABLE CHEST - 1 VIEW  COMPARISON:  12/17/2014  FINDINGS: The endotracheal tube tip is 3.9 cm above the carina. The nasogastric tube extends into the stomach and off the inferior edge of the image. The right jugular central line extends to the cavoatrial junction. There is basilar consolidation on the left with obscured diaphragmatic contour. There is a small left pleural effusion. The right lung is clear.  IMPRESSION: Support equipment appears satisfactorily positioned.  There is dense left base consolidation and small left pleural effusion.   Electronically Signed   By: Andreas Newport M.D.   On: 12/18/2014 06:06   Dg Chest Port 1 View  12/17/2014   CLINICAL DATA:  Central line placement.  EXAM: PORTABLE CHEST - 1 VIEW  COMPARISON:  12/17/2014  FINDINGS: Endotracheal tube is in place with tip 5.4 cm above carina. Right IJ central line tip overlies the level of the lower superior vena cava. Heart is enlarged. Suspect left pleural effusion versus artifact related to positioning. No  pneumothorax.  IMPRESSION: 1. Interval placement of right-sided IJ line, tip overlying the level of the lower superior vena cava. 2. No pneumothorax PE 3. Possible left pleural effusion.   Electronically Signed   By: Nolon Nations M.D.   On: 12/17/2014 14:56    Anti-infectives: Anti-infectives    Start     Dose/Rate Route Frequency Ordered Stop   12/18/14 1100  fluconazole (DIFLUCAN) IVPB 400 mg     400 mg 100 mL/hr over 120 Minutes Intravenous Every 24 hours 12/18/14 1036     12/17/14 1100  piperacillin-tazobactam (ZOSYN)  IVPB 3.375 g     3.375 g 12.5 mL/hr over 240 Minutes Intravenous Every 8 hours 12/17/14 0956     12/17/14 0600  cefoTEtan (CEFOTAN) 2 g in dextrose 5 % 50 mL IVPB     2 g 100 mL/hr over 30 Minutes Intravenous To Surgery 12/17/14 0542 12/17/14 0726      Assessment/Plan: s/p Procedure(s): EXPLORATORY LAPAROTOMY Right, Transverse, partial left colectomy with en bloc partial gastrectomy illeostomy (N/A)  Remains critically ill with worsening renal function as well. Continuing care per CCM Prognosis poor  LOS: 3 days    Shaterica Mcclatchy A 12/19/2014

## 2014-12-19 NOTE — Progress Notes (Signed)
Utilization Review Completed.  

## 2014-12-19 NOTE — Progress Notes (Signed)
Chaplain paged to visit pt and family.   [place] family bedside as Chaplain visited.   Pt's two daughters present bedside, one grandson and one granddaughter. Grandson voiced that "I'm not really the sharing type," He was not visibly distressed but his voice and tonality indicated he is wrestling with anticipatory grief as well and has been playing games on his cell phone to help draw back from what he is witnessing bedside.  Barnett Applebaum and [name] are the pt's biological daughters, the other woman in the room is a close friend and self-reported she's like another mom to them.   Barnett Applebaum mentioned that she is wrestling with having to decide if she wants her mom to transition to comfort care and is feeling the weight of that decision and the desire not to want to let her go.   [name] voiced that she is "angy and feels so much pain, deep pain" that her mother is "supposed to be home, she's not supposed to be here, I can't let her go". In exploring that statement, Rhonda's father died when she was 23yrs old and "mom has been my only, she's been such a good friend, I'm not ready for her to go"   Chaplain facilitated story-telling as family shared their favorite things about pt and how they see the pt in each other.   After prayer Chaplain left them for private time. Atmosphere still heavy.   Delford Field, Chaplain 12/19/2014

## 2014-12-19 NOTE — Progress Notes (Signed)
Orthopedic Tech Progress Note Patient Details:  Kathryn Knapp Mar 08, 1941 [id number]  Ortho Devices Type of Ortho Device: Postop shoe/boot Ortho Device/Splint Location: Prafo Ortho Device/Splint Interventions: Application   [name] 12/19/2014, 11:48 AM

## 2014-12-19 NOTE — Progress Notes (Signed)
Upon assessment, unable to palpate or doppler DP or PT pulses on RLE.  MD notified, sending PA to further assess.  Will continue to monitor.

## 2014-12-20 DIAGNOSIS — I998 Other disorder of circulatory system: Secondary | ICD-10-CM

## 2014-12-20 DIAGNOSIS — K5649 Other impaction of intestine: Secondary | ICD-10-CM

## 2014-12-20 LAB — GLUCOSE, CAPILLARY
GLUCOSE-CAPILLARY: 124 mg/dL — AB (ref 70–99)
GLUCOSE-CAPILLARY: 169 mg/dL — AB (ref 70–99)
Glucose-Capillary: 110 mg/dL — ABNORMAL HIGH (ref 70–99)
Glucose-Capillary: 115 mg/dL — ABNORMAL HIGH (ref 70–99)
Glucose-Capillary: 135 mg/dL — ABNORMAL HIGH (ref 70–99)
Glucose-Capillary: 141 mg/dL — ABNORMAL HIGH (ref 70–99)
Glucose-Capillary: 152 mg/dL — ABNORMAL HIGH (ref 70–99)

## 2014-12-20 LAB — CBC
HCT: 25.4 % — ABNORMAL LOW (ref 36.0–46.0)
HEMOGLOBIN: 8.3 g/dL — AB (ref 12.0–15.0)
MCH: 25.1 pg — AB (ref 26.0–34.0)
MCHC: 32.7 g/dL (ref 30.0–36.0)
MCV: 76.7 fL — AB (ref 78.0–100.0)
Platelets: 106 10*3/uL — ABNORMAL LOW (ref 150–400)
RBC: 3.31 MIL/uL — AB (ref 3.87–5.11)
RDW: 16.4 % — ABNORMAL HIGH (ref 11.5–15.5)
WBC: 15.7 10*3/uL — ABNORMAL HIGH (ref 4.0–10.5)

## 2014-12-20 LAB — BASIC METABOLIC PANEL
ANION GAP: 10 (ref 5–15)
BUN: 38 mg/dL — ABNORMAL HIGH (ref 6–20)
CO2: 20 mmol/L — AB (ref 22–32)
Calcium: 7.1 mg/dL — ABNORMAL LOW (ref 8.9–10.3)
Chloride: 103 mmol/L (ref 101–111)
Creatinine, Ser: 2.69 mg/dL — ABNORMAL HIGH (ref 0.44–1.00)
GFR calc Af Amer: 19 mL/min — ABNORMAL LOW (ref >60)
GFR calc non Af Amer: 16 mL/min — ABNORMAL LOW (ref >60)
Glucose, Bld: 157 mg/dL — ABNORMAL HIGH (ref 70–99)
Potassium: 4 mmol/L (ref 3.5–5.1)
Sodium: 133 mmol/L — ABNORMAL LOW (ref 135–145)

## 2014-12-20 LAB — TRIGLYCERIDES: TRIGLYCERIDES: 136 mg/dL (ref <150)

## 2014-12-20 MED ORDER — HYDROCORTISONE NA SUCCINATE PF 100 MG IJ SOLR
50.0000 mg | Freq: Two times a day (BID) | INTRAMUSCULAR | Status: DC
  Administered 2014-12-20 (×2): 50 mg via INTRAVENOUS
  Filled 2014-12-20 (×4): qty 1

## 2014-12-20 NOTE — Progress Notes (Signed)
At 1425 attempted to wean on cpap/ps per MD plans for vent today.  Pt developed apnea spells and placed back on full vent support.  RN at bedside and aware.  VSS.

## 2014-12-20 NOTE — Progress Notes (Signed)
3 Days Post-Op  Subjective: Patient on vent, but awake UOP still marginal Family at bedside  Objective: Vital signs in last 24 hours: Temp:  [97.4 F (36.3 C)-98.3 F (36.8 C)] 98.3 F (36.8 C) (05/04 0822) Pulse Rate:  [41-86] 58 (05/04 0915) Resp:  [6-18] 15 (05/04 0915) BP: (81-177)/(38-90) 105/41 mmHg (05/04 0900) SpO2:  [98 %-100 %] 100 % (05/04 0915) Arterial Line BP: (66-209)/(31-117) 144/44 mmHg (05/04 0915) FiO2 (%):  [40 %] 40 % (05/04 0737) Last BM Date: 12/16/14  Intake/Output from previous day: 05/03 0701 - 05/04 0700 In: 3089.2 [I.V.:2709.2; NG/GT:180; IV Piggyback:200] Out: 655 [Urine:465; Emesis/NG output:100; Drains:40; Stool:50] Intake/Output this shift: Total I/O In: 247.5 [I.V.:217.5; NG/GT:30] Out: 140 [Urine:140]  Abdomen soft Drain remains serous Ostomy viable with some bile in the bag  Lab Results:   Recent Labs  12/19/14 0330 12/20/14 0500  WBC 20.5* 15.7*  HGB 9.3* 8.3*  HCT 29.4* 25.4*  PLT 158 106*   BMET  Recent Labs  12/19/14 0330 12/20/14 0500  NA 132* 133*  K 4.3 4.0  CL 106 103  CO2 15* 20*  GLUCOSE 162* 157*  BUN 32* 38*  CREATININE 2.23* 2.69*  CALCIUM 6.9* 7.1*   PT/INR No results for input(s): LABPROT, INR in the last 72 hours. ABG  Recent Labs  12/17/14 1317 12/18/14 1143  PHART 7.298* 7.240*  HCO3 17.5* 15.7*    Studies/Results: Dg Chest Port 1 View  12/19/2014   CLINICAL DATA:  Acute respiratory failure with hypoxia.  EXAM: PORTABLE CHEST - 1 VIEW  COMPARISON:  12/18/2014.  FINDINGS: Unchanged support tubes and lines. LEFT lower lobe atelectasis with pleural effusion, possible superimposed infiltrate, stable decreased lung volumes.  IMPRESSION: Stable chest.   Electronically Signed   By: Rolla Flatten M.D.   On: 12/19/2014 07:10    Anti-infectives: Anti-infectives    Start     Dose/Rate Route Frequency Ordered Stop   12/19/14 1600  fluconazole (DIFLUCAN) IVPB 200 mg     200 mg 100 mL/hr over 60  Minutes Intravenous Every 24 hours 12/19/14 0901     12/18/14 1100  fluconazole (DIFLUCAN) IVPB 400 mg  Status:  Discontinued     400 mg 100 mL/hr over 120 Minutes Intravenous Every 24 hours 12/18/14 1036 12/19/14 0901   12/17/14 1100  piperacillin-tazobactam (ZOSYN) IVPB 3.375 g     3.375 g 12.5 mL/hr over 240 Minutes Intravenous Every 8 hours 12/17/14 0956     12/17/14 0600  cefoTEtan (CEFOTAN) 2 g in dextrose 5 % 50 mL IVPB     2 g 100 mL/hr over 30 Minutes Intravenous To Surgery 12/17/14 0542 12/17/14 0726      Assessment/Plan: s/p Procedure(s): EXPLORATORY LAPAROTOMY Right, Transverse, partial left colectomy with en bloc partial gastrectomy illeostomy (N/A)  Remains critically ill.  I stressed this as best as possible to the family.  If they want to continue care, will try trickle tube feeds in the next 24 to 48 hours.  LOS: 4 days    Jayd Forrey A 12/20/2014

## 2014-12-20 NOTE — Consult Note (Signed)
WOC requested to assess buttocks/sacrum.  Pt was noted to have a deep tissue injury to sacrum on 5/3.  She spent an extended period of time in the OR on 5/1.  She has multiple systemic factors which may impair healing; immobility, intubation, emaciated, pressors. Dark purple deep tissue injury to sacrum 4X1cm.  No open wound or drainage. Deep tissue injuries are high risk to evolve into full thickness tissue loss despite optimal plan of care. Pt has a foam dressing in place to protect site and is on a Sport low-airloss bed to reduce pressure. No family present to discuss plan of care and pt is unable to communicate at this time. Julien Girt MSN, RN, [place], [place], [place]

## 2014-12-20 NOTE — Progress Notes (Addendum)
PULMONARY / CRITICAL CARE MEDICINE   Name: Kathryn Knapp MRN: [id number] DOB: 03-18-1941    ADMISSION DATE:  12/16/2014 CONSULTATION DATE:  Alphonzo Grieve  REFERRING MD :  Grandville Silos   CHIEF COMPLAINT:  Vent management   INITIAL PRESENTATION:  74 yo female with hx HTN, DM presented initially 4/30 with abd pain, n/v and weight loss.  CT abdomen revealed large bowel obstruction and colonic mass.  She was prepped for colonoscopy for decompression prior to elective surgery.  However a CT chest to look for metastasis revealed large amount free air and pt was taken urgently to OR.   Large amount of stool noted with concern for significant peritonitis/ sepsis and pt was left intubated post op.  PCCM consulted to assist with vent management.    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 Associated mesenteric/retroperitoneal nodal metastases. 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 5/2 Echo> LVH, LVEF 55%, normal RV size and function, trace pericardial effusion  SIGNIFICANT EVENTS: 5/1 OR>> exp lap,  Partial colectomy with ileostomy r/t perforated bowel  5/3 Cyanosis R foot, worsening renal failure, made DNR   SUBJECTIVE: BP improved, bradycardia, on dopamine  VITAL SIGNS: Temp:  [97.4 F (36.3 C)-98.3 F (36.8 C)] 98.3 F (36.8 C) (05/04 0822) Pulse Rate:  [41-86] 58 (05/04 0915) Resp:  [6-18] 15 (05/04 0915) BP: (81-177)/(39-90) 105/41 mmHg (05/04 0900) SpO2:  [98 %-100 %] 100 % (05/04 0915) Arterial Line BP: (66-209)/(31-117) 144/44 mmHg (05/04 0915) FiO2 (%):  [40 %] 40 % (05/04 0737) HEMODYNAMICS: CVP:  [2 mmHg-9 mmHg] 2 mmHg VENTILATOR SETTINGS: Vent Mode:  [-] PRVC FiO2 (%):  [40 %] 40 % Set Rate:  [15  bmp] 15 bmp Vt Set:  [530 mL] 530 mL PEEP:  [5 cmH20] 5 cmH20 Plateau Pressure:  [18 cmH20-20 cmH20] 19 cmH20 INTAKE / OUTPUT:  Intake/Output Summary (Last 24 hours) at 12/20/14 1017 Last data filed at 12/20/14 0900  Gross per 24 hour  Intake 2948.49 ml  Output    755 ml  Net 2193.49 ml    PHYSICAL EXAMINATION: Gen: sedated on vent HENT: NCAT, ETT in place Eyes: EOMi PULM: CTA B CV:RRR, no mgr GI: BS infrequent, ileostomy Derm: surgical dressing C/D/I MSK:R foot> cyanotic sole, cool to touch (unchanged 5/4) Neuro: sedated on vent, moves all four ext spontaneously  LABS:  CBC  Recent Labs Lab 12/18/14 0325 12/19/14 0330 12/20/14 0500  WBC 12.7* 20.5* 15.7*  HGB 10.5* 9.3* 8.3*  HCT 32.1* 29.4* 25.4*  PLT 217 158 106*   Coag's  Recent Labs Lab 12/16/14 1632  INR 1.19   BMET  Recent Labs Lab 12/18/14 1038 12/19/14 0330 12/20/14 0500  NA 136 132* 133*  K 4.2 4.3 4.0  CL 111 106 103  CO2 14* 15* 20*  BUN 27* 32* 38*  CREATININE 1.94* 2.23* 2.69*  GLUCOSE 136* 162* 157*   Electrolytes  Recent Labs Lab 12/16/14 1632  12/18/14 1038 12/19/14 0330 12/20/14 0500  CALCIUM  --   < > 6.6* 6.9* 7.1*  MG 1.8  --   --   --   --   < > = values in this interval not displayed. Sepsis Markers  Recent Labs Lab 12/17/14 1511  12/17/14 1912 12/17/14 2210  LATICACIDVEN 4.6* 3.8* 2.6*   ABG  Recent Labs Lab 12/17/14 0841 12/17/14 1317 12/18/14 1143  PHART 7.321* 7.298* 7.240*  PCO2ART 37.3 35.4 36.7  PO2ART 162.0* 138.0* 156.0*   Liver Enzymes  Recent Labs Lab 12/16/14 1004  AST 26  ALT 14  ALKPHOS 89  BILITOT 1.1  ALBUMIN 2.7*   Cardiac Enzymes  Recent Labs Lab 12/18/14 1038  TROPONINI <0.03   Glucose  Recent Labs Lab 12/19/14 1630 12/19/14 1922 12/19/14 2041 12/20/14 0010 12/20/14 0349 12/20/14 0821  GLUCAP 141* 150* 154* 169* 152* 135*    Imaging CXR images personally reviewed. ETT in good position, R IJ in place,  lungs clear   ASSESSMENT / PLAN:  PULMONARY OETT 5/1>>> Acute respiratory failure - improved 5/4P:   Start weaning attempts CXR in AM Hold weaning attempt today  CARDIOVASCULAR Septic shock> improved 5/4 Hx HTN  Acute ischemia to R foot > most likely due to high pressor requirement, if she made a dramatic recovery would need to consider vascular work up P:  Continue hydrocortisone  Wean off dopamine tele  RENAL AKI > worsening 5/4 Metabolic acidosis  (NAGMA in setting of AKI) P:   Continue BICARB gtt F/u chem  Likely lasix tomorrow  GASTROINTESTINAL Peritonitis > Perforation of cecum - s/p exp lap 5/1 Large obstructing colonic mass - 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 We now hope to extubate her in the next 24 hours so she can spend time with family before what I think will be her death in the hospital.  Continue current management as detailed above.  Code status DNR   My CC time is 40 minutes  Roselie Awkward, MD [place] [facility] Pager: [phone] Cell: [phone] If no response, call [phone]

## 2014-12-21 ENCOUNTER — Inpatient Hospital Stay (HOSPITAL_COMMUNITY): Payer: Medicare Other

## 2014-12-21 DIAGNOSIS — Z9889 Other specified postprocedural states: Secondary | ICD-10-CM

## 2014-12-21 LAB — TYPE AND SCREEN
ABO/RH(D): A POS
Antibody Screen: NEGATIVE
Unit division: 0
Unit division: 0

## 2014-12-21 LAB — GLUCOSE, CAPILLARY
GLUCOSE-CAPILLARY: 109 mg/dL — AB (ref 70–99)
GLUCOSE-CAPILLARY: 117 mg/dL — AB (ref 70–99)
Glucose-Capillary: 127 mg/dL — ABNORMAL HIGH (ref 70–99)
Glucose-Capillary: 106 mg/dL — ABNORMAL HIGH (ref 70–99)
Glucose-Capillary: 115 mg/dL — ABNORMAL HIGH (ref 70–99)
Glucose-Capillary: 114 mg/dL — ABNORMAL HIGH (ref 70–99)

## 2014-12-21 LAB — PREPARE RBC (CROSSMATCH)

## 2014-12-21 LAB — CBC WITH DIFFERENTIAL/PLATELET
Basophils Absolute: 0 10*3/uL (ref 0.0–0.1)
Basophils Relative: 0 % (ref 0–1)
EOS ABS: 0 10*3/uL (ref 0.0–0.7)
EOS PCT: 0 % (ref 0–5)
HCT: 18.7 % — ABNORMAL LOW (ref 36.0–46.0)
Hemoglobin: 6.2 g/dL — CL (ref 12.0–15.0)
LYMPHS ABS: 0.7 10*3/uL (ref 0.7–4.0)
LYMPHS PCT: 5 % — AB (ref 12–46)
MCH: 25 pg — AB (ref 26.0–34.0)
MCHC: 33.2 g/dL (ref 30.0–36.0)
MCV: 75.4 fL — ABNORMAL LOW (ref 78.0–100.0)
Monocytes Absolute: 1.1 10*3/uL — ABNORMAL HIGH (ref 0.1–1.0)
Monocytes Relative: 8 % (ref 3–12)
NEUTROS PCT: 87 % — AB (ref 43–77)
Neutro Abs: 12.2 10*3/uL — ABNORMAL HIGH (ref 1.7–7.7)
PLATELETS: 105 10*3/uL — AB (ref 150–400)
RBC: 2.48 MIL/uL — ABNORMAL LOW (ref 3.87–5.11)
RDW: 16.3 % — ABNORMAL HIGH (ref 11.5–15.5)
WBC: 14 10*3/uL — ABNORMAL HIGH (ref 4.0–10.5)

## 2014-12-21 LAB — BASIC METABOLIC PANEL
Anion gap: 11 (ref 5–15)
BUN: 40 mg/dL — ABNORMAL HIGH (ref 6–20)
CALCIUM: 7.3 mg/dL — AB (ref 8.9–10.3)
CO2: 26 mmol/L (ref 22–32)
Chloride: 103 mmol/L (ref 101–111)
Creatinine, Ser: 2.74 mg/dL — ABNORMAL HIGH (ref 0.44–1.00)
GFR, EST AFRICAN AMERICAN: 19 mL/min — AB (ref >60)
GFR, EST NON AFRICAN AMERICAN: 16 mL/min — AB (ref >60)
GLUCOSE: 136 mg/dL — AB (ref 70–99)
Potassium: 3.7 mmol/L (ref 3.5–5.1)
SODIUM: 140 mmol/L (ref 135–145)

## 2014-12-21 MED ORDER — CHLORHEXIDINE GLUCONATE 0.12 % MT SOLN
15.0000 mL | Freq: Two times a day (BID) | OROMUCOSAL | Status: DC
Start: 2014-12-22 — End: 2015-01-01
  Administered 2014-12-22 – 2015-01-01 (×18): 15 mL via OROMUCOSAL
  Filled 2014-12-21 (×23): qty 15

## 2014-12-21 MED ORDER — FUROSEMIDE 10 MG/ML IJ SOLN
80.0000 mg | Freq: Once | INTRAMUSCULAR | Status: AC
  Administered 2014-12-21: 80 mg via INTRAVENOUS
  Filled 2014-12-21: qty 8

## 2014-12-21 MED ORDER — HEPARIN (PORCINE) IN NACL 100-0.45 UNIT/ML-% IJ SOLN
1700.0000 [IU]/h | INTRAMUSCULAR | Status: DC
  Administered 2014-12-21 – 2014-12-23 (×3): 1300 [IU]/h via INTRAVENOUS
  Administered 2014-12-24: 1400 [IU]/h via INTRAVENOUS
  Administered 2014-12-24: 1300 [IU]/h via INTRAVENOUS
  Administered 2014-12-25: 1500 [IU]/h via INTRAVENOUS
  Administered 2014-12-26 – 2014-12-28 (×4): 1700 [IU]/h via INTRAVENOUS
  Filled 2014-12-21 (×13): qty 250

## 2014-12-21 MED ORDER — HYDRALAZINE HCL 20 MG/ML IJ SOLN: 10.0000 mg | INTRAMUSCULAR | Status: DC | PRN

## 2014-12-21 MED ORDER — HYDROCORTISONE NA SUCCINATE PF 100 MG IJ SOLR
50.0000 mg | Freq: Two times a day (BID) | INTRAMUSCULAR | Status: DC
  Administered 2014-12-21 (×2): 50 mg via INTRAVENOUS
  Filled 2014-12-21 (×3): qty 1

## 2014-12-21 MED ORDER — ASPIRIN 81 MG PO CHEW
81.0000 mg | CHEWABLE_TABLET | Freq: Every day | ORAL | Status: DC
  Administered 2014-12-21 – 2014-12-27 (×7): 81 mg via ORAL
  Filled 2014-12-21 (×8): qty 1

## 2014-12-21 MED ORDER — SODIUM CHLORIDE 0.9 % IV SOLN: Freq: Once | INTRAVENOUS | Status: DC

## 2014-12-21 MED ORDER — METOPROLOL TARTRATE 1 MG/ML IV SOLN
5.0000 mg | Freq: Four times a day (QID) | INTRAVENOUS | Status: DC
  Filled 2014-12-21 (×4): qty 5

## 2014-12-21 MED ORDER — CETYLPYRIDINIUM CHLORIDE 0.05 % MT LIQD
7.0000 mL | Freq: Two times a day (BID) | OROMUCOSAL | Status: DC
  Administered 2014-12-22 – 2015-01-01 (×19): 7 mL via OROMUCOSAL

## 2014-12-21 MED ORDER — FENTANYL CITRATE (PF) 100 MCG/2ML IJ SOLN: 25.0000 ug | INTRAMUSCULAR | Status: DC | PRN

## 2014-12-21 MED ORDER — FENTANYL CITRATE (PF) 100 MCG/2ML IJ SOLN
12.5000 ug | INTRAMUSCULAR | Status: DC | PRN
  Administered 2014-12-22: 25 ug via INTRAVENOUS
  Administered 2014-12-22 (×2): 12.5 ug via INTRAVENOUS
  Administered 2014-12-22 – 2014-12-30 (×35): 25 ug via INTRAVENOUS
  Administered 2014-12-30: 20 ug via INTRAVENOUS
  Administered 2014-12-31 – 2015-01-01 (×4): 25 ug via INTRAVENOUS
  Filled 2014-12-21 (×45): qty 2

## 2014-12-21 NOTE — Progress Notes (Signed)
PULMONARY / CRITICAL CARE MEDICINE   Name: Kathryn Knapp MRN: [id number] DOB: 08/03/1941    ADMISSION DATE:  12/16/2014 CONSULTATION DATE:  Alphonzo Grieve  REFERRING MD :  Grandville Silos   CHIEF COMPLAINT:  Vent management   INITIAL PRESENTATION:  73 yo female with hx HTN, DM presented initially 4/30 with abd pain, n/v and weight loss.  CT abdomen revealed large bowel obstruction and colonic mass.  She was prepped for colonoscopy for decompression prior to elective surgery.  However a CT chest to look for metastasis revealed large amount free air and pt was taken urgently to OR.   Large amount of stool noted with concern for significant peritonitis/ sepsis and pt was left intubated post op.  PCCM consulted to assist with vent management.    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 Associated mesenteric/retroperitoneal nodal metastases. 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 5/2 Echo> LVH, LVEF 55%, normal RV size and function, trace pericardial effusion  SIGNIFICANT EVENTS: 5/1 OR>> exp lap,  Partial colectomy with ileostomy r/t perforated bowel  5/3 Cyanosis R foot, worsening renal failure, made DNR 5/5 Off pressors, UOP up, following commands, Afib with RVR  SUBJECTIVE: Off pressors, UOP up, following commands, Afib with RVR  VITAL SIGNS: Temp:  [97.7 F (36.5 C)-98.6 F (37 C)] 97.7 F (36.5 C) (05/05 0710) Pulse Rate:  [45-106] 106 (05/05 0710) Resp:  [8-21] 15 (05/05 0710) BP: (97-174)/(39-82) 174/82 mmHg (05/05 0700) SpO2:  [98 %-100 %] 100 % (05/05 0710) Arterial Line BP: (102-206)/(37-76) 129/53 mmHg (05/05 0710) FiO2 (%):  [40 %] 40 % (05/05 0728) Weight:  [187 lb 9.8 oz (85.1 kg)] 187 lb 9.8 oz  (85.1 kg) (05/05 0400) HEMODYNAMICS: CVP:  [2 mmHg-15 mmHg] 2 mmHg VENTILATOR SETTINGS: Vent Mode:  [-] PRVC FiO2 (%):  [40 %] 40 % Set Rate:  [15 bmp] 15 bmp Vt Set:  [530 mL] 530 mL PEEP:  [5 cmH20] 5 cmH20 Pressure Support:  [5 cmH20] 5 cmH20 Plateau Pressure:  [16 cmH20-18 cmH20] 17 cmH20 INTAKE / OUTPUT:  Intake/Output Summary (Last 24 hours) at 12/21/14 0743 Last data filed at 12/21/14 0700  Gross per 24 hour  Intake   3005 ml  Output   1510 ml  Net   1495 ml    PHYSICAL EXAMINATION: Gen: awake on vent HENT: NCAT, ETT in place Eyes: EOMi PULM: CTA B XB:DZHGD irreg, no mgr GI: BS infrequent, ileostomy Derm: surgical dressing C/D/I MSK:R foot> cyanotic sole, cool to touch (unchanged 5/5) Neuro: Awake, alert, following commands  LABS:  CBC  Recent Labs Lab 12/19/14 0330 12/20/14 0500 12/21/14 0430  WBC 20.5* 15.7* 14.0*  HGB 9.3* 8.3* 6.2*  HCT 29.4* 25.4* 18.7*  PLT 158 106* 105*   Coag's  Recent Labs Lab 12/16/14 1632  INR 1.19   BMET  Recent Labs Lab 12/19/14 0330 12/20/14 0500 12/21/14 0430  NA 132* 133* 140  K 4.3 4.0 3.7  CL 106 103 103  CO2 15* 20* 26  BUN 32* 38* 40*  CREATININE 2.23* 2.69* 2.74*  GLUCOSE 162* 157* 136*   Electrolytes  Recent Labs Lab 12/16/14 1632  12/19/14 0330 12/20/14 0500 12/21/14 0430  CALCIUM  --   < >  6.9* 7.1* 7.3*  MG 1.8  --   --   --   --   < > = values in this interval not displayed. Sepsis Markers  Recent Labs Lab 12/17/14 1511 12/17/14 1912 12/17/14 2210  LATICACIDVEN 4.6* 3.8* 2.6*   ABG  Recent Labs Lab 12/17/14 0841 12/17/14 1317 12/18/14 1143  PHART 7.321* 7.298* 7.240*  PCO2ART 37.3 35.4 36.7  PO2ART 162.0* 138.0* 156.0*   Liver Enzymes  Recent Labs Lab 12/16/14 1004  AST 26  ALT 14  ALKPHOS 89  BILITOT 1.1  ALBUMIN 2.7*   Cardiac Enzymes  Recent Labs Lab 12/18/14 1038  TROPONINI <0.03   Glucose  Recent Labs Lab 12/20/14 0821 12/20/14 1147  12/20/14 1628 12/20/14 1924 12/20/14 2341 12/21/14 0332  GLUCAP 135* 115* 141* 124* 110* 127*    Imaging CXR images personally reviewed. ETT in good position, R IJ in place, lungs with basilar airspace disease   ASSESSMENT / PLAN:  PULMONARY OETT 5/1>>> Acute respiratory failure - improved again 5/5  Pulm edema on CXR 5/5 P:   SBT now Lasix now Plan extubation today CXR in AM  CARDIOVASCULAR Septic shock> resolved 5/5 HTN  Afib with RVR > new 5/5 Acute ischemia to R foot >no improvement 5/5 P:  Wean off hydrocortisone  Metoprolol for AFib with RVR Hydralazine for SBP > 160 Will need to discuss overall situation with patient prior to more work-up for the R foot Gen surgery OK with heparin if she decides for aggressive care tele  RENAL AKI > improved UOP 5/5, Cr plateau? 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, mesentery, lymph nodes S/p partial L colectomy with ileostomy  Metastatic colon cancer P:   Surgery following  Keep NG post extubation  Wound care  PPI   HEMATOLOGIC Hgb 6.2 5/5, no sign of bleeding  P:  1 U PRBC now Repeat cbc later today Need to f/u cbc post transfusion prior to considering heparinization (ischemic foot)  INFECTIOUS Peritonitis causing septic shock > improved P:   BCx2 5/1>>> UC 5/1>>>  Zosyn 5/1>>> Plan 7 days Fluconazole 5/2 >> plan 5 days  ENDOCRINE History of DM2 per notes  P:   Continue POC accuchecks and SSI  NEUROLOGIC Sedation  P:   RASS goal:0 Fentanyl gtt > d/c, use prn D/c versed  Daily WUA   FAMILY  - Updates:  Family updated by phone 5/5; plan extubation 5/5, let her know the severity of what is going on and help [place] decide how aggressive to be.  I favor a palliative approach but she has made  surprising improvement, would like for her to tell us how she wants to proceed.  Code status DNR  My CC time is 35 minutes  Roselie Awkward, MD [place] [facility] Pager: [phone] Cell: [phone] If no response, call [phone]

## 2014-12-21 NOTE — Consult Note (Signed)
WOC ostomy consult note Stoma type/location: Pt received ileostomy on 5/1; consult requested at this time. Stomal assessment/size: Stoma to RLQ red and viable, flush with skin level, 1 inch Peristomal assessment: Intact skin surrounding Output: Small amt liquid brown stool in pouch  Ostomy pouching: 2pc.  Education provided: Current pouch is leaking behind barrier. Pt intubated and unable to ask questions or participate in pouch change at this time.  Demonstrated pouch change using barrier ring to maintain seal and 2 piece pouching system. Educational materials left at bedside, no family members present at this time.  Will continue to follow for further educational sessions when stable and out of ICU. Julien Girt MSN, RN, [place], [place], [place]

## 2014-12-21 NOTE — Consult Note (Signed)
[place]  [phone]) [phone] [phone]  Inpatient New Consult Note   Referring physician: Roselie Awkward, MD  Patient location: [id number]  CHIEF COMPLAINTS/PURPOSE OF CONSULTATION:  Newly diagnosed stage IV colon cancer  HISTORY OF PRESENTING ILLNESS:  74 yo female with hx HTN, DM presented initially 4/30 with  2 week history of  worsening nausea, vomiting and abd pain, and weight loss. CT abdomen revealed large bowel obstruction and colonic mass. She was prepped for colonoscopy for decompression prior to elective surgery. However a CT chest to look for metastasis revealed large amount free air and pt was taken urgently to OR. A large colon mass at the spleen flecture with invading stomach was found. She underwentcolon segmental resection, partial gastrectomy, Large amount of stool noted with concern for significant peritonitis/ sepsis and pt was left intubated post op. she also developed acute renal failure, atrial fibrillation, and right foot necrosis postop. She is now extubated.   MEDICAL HISTORY:  Past Medical History  Diagnosis Date  . Diabetes mellitus without complication   . Hypertension     SURGICAL HISTORY: Past Surgical History  Procedure Laterality Date  . Cholecystectomy    . Abdominal hysterectomy    . Laparotomy N/A 12/17/2014    Procedure: EXPLORATORY LAPAROTOMY Right, Transverse, partial left colectomy with en bloc partial gastrectomy illeostomy;  Surgeon: Georganna Skeans, MD;  Location: [facility];  Service: General;  Laterality: N/A;    SOCIAL HISTORY: History   Social History  . Marital Status: Divorced    Spouse Name: N/A  . Number of Children: N/A  . Years of Education: N/A   Occupational History  . Not on file.   Social History Main Topics  . Smoking status: Never Smoker   . Smokeless tobacco: Not on file  . Alcohol Use: No  . Drug Use: No  . Sexual Activity: Not on file   Other Topics Concern  . Not on file    Social History Narrative    FAMILY HISTORY: History reviewed. No pertinent family history.  ALLERGIES:  has No Known Allergies.  MEDICATIONS:  Current Facility-Administered Medications  Medication Dose Route Frequency Provider Last Rate Last Dose  . 0.9 %  sodium chloride infusion   Intravenous Continuous Wilford Corner, MD 20 mL/hr at 12/19/14 0400    . 0.9 %  sodium chloride infusion   Intravenous Once Wilhelmina Mcardle, MD      . albuterol (PROVENTIL) (2.5 MG/3ML) 0.083% nebulizer solution 2.5 mg  2.5 mg Nebulization Q4H PRN Marijean Heath, NP      . aspirin chewable tablet 81 mg  81 mg Oral Daily Juanito Doom, MD   81 mg at 12/21/14 1608  . fentaNYL (SUBLIMAZE) injection 12.5-25 mcg  12.5-25 mcg Intravenous Q2H PRN Juanito Doom, MD      . fentaNYL (SUBLIMAZE) injection 25-100 mcg  25-100 mcg Intravenous Q2H PRN Juanito Doom, MD      . fluconazole (DIFLUCAN) IVPB 200 mg  200 mg Intravenous Q24H Otilio Miu, RPH   200 mg at 12/21/14 1605  . heparin ADULT infusion 100 units/mL (25000 units/250 mL)  1,300 Units/hr Intravenous Continuous Otilio Miu, RPH 13 mL/hr at 12/21/14 1608 1,300 Units/hr at 12/21/14 1608  . hydrALAZINE (APRESOLINE) injection 10-40 mg  10-40 mg Intravenous Q4H PRN Juanito Doom, MD      . hydrocortisone sodium succinate (SOLU-CORTEF) 100 MG injection 50 mg  50 mg Intravenous Q12H Juanito Doom, MD  50 mg at 12/21/14 0906  . insulin aspart (novoLOG) injection 2-6 Units  2-6 Units Subcutaneous 6 times per day Elsie Stain, MD   2 Units at 12/21/14 0435  . metoprolol (LOPRESSOR) injection 5 mg  5 mg Intravenous 4 times per day Juanito Doom, MD   5 mg at 12/21/14 0745  . pantoprazole (PROTONIX) injection 40 mg  40 mg Intravenous Q24H Marijean Heath, NP   40 mg at 12/21/14 1245  . piperacillin-tazobactam (ZOSYN) IVPB 3.375 g  3.375 g Intravenous Q8H Georganna Skeans, MD   3.375 g at 12/21/14 1019    REVIEW OF  SYSTEMS:   Constitutional: Denies fevers, chills or abnormal night sweats Eyes: Denies blurriness of vision, double vision or watery eyes Ears, nose, mouth, throat, and face: Denies mucositis or sore throat Respiratory: Denies cough, dyspnea or wheezes Cardiovascular: Denies palpitation, chest discomfort or lower extremity swelling Gastrointestinal:  Denies nausea, heartburn or change in bowel habits Skin: Denies abnormal skin rashes Lymphatics: Denies new lymphadenopathy or easy bruising Neurological:Denies numbness, tingling or new weaknesses Behavioral/Psych: Mood is stable, no new changes  All other systems were reviewed with the patient and are negative.  PHYSICAL EXAMINATION: ECOG PERFORMANCE STATUS: 4 - Bedbound  Filed Vitals:   12/21/14 1700  BP:   Pulse: 47  Temp:   Resp: 10   Filed Weights   12/19/14 0300 12/20/14 0400 12/21/14 0400  Weight: 177 lb 4 oz (80.4 kg) 184 lb 11.9 oz (83.8 kg) 187 lb 9.8 oz (85.1 kg)    GENERAL:alert, no distress, chronically ill-appearing SKIN: skin color, texture, turgor are normal, no rashes or significant lesions EYES: normal, conjunctiva are pink and non-injected, sclera clear OROPHARYNX:no exudate, no erythema and lips, buccal mucosa, and tongue normal  NECK: supple, thyroid normal size, non-tender, without nodularity LYMPH:  no palpable lymphadenopathy in the cervical, axillary or inguinal LUNGS: clear to auscultation and percussion with normal breathing effort HEART: regular rate & rhythm and no murmurs and no lower extremity edema ABDOMEN:abdomen soft, surgical wound is covered, draining tube on the left side of abdomen, (+) colostomy bag on right, normal bowel sounds Musculoskeletal:no cyanosis of digits and no clubbing EXT: Right foot is cold, patchy purpura skin, pulse is not palpable  PSYCH: alert & oriented x 3 with fluent speech NEURO: no focal motor/sensory deficits  LABORATORY DATA:  I have reviewed the data as  listed Lab Results  Component Value Date   WBC 14.0* 12/21/2014   HGB 6.2* 12/21/2014   HCT 18.7* 12/21/2014   MCV 75.4* 12/21/2014   PLT 105* 12/21/2014    Recent Labs  12/16/14 1004  12/19/14 0330 12/20/14 0500 12/21/14 0430  NA 135  < > 132* 133* 140  K 2.3*  < > 4.3 4.0 3.7  CL 98  < > 106 103 103  CO2 26  < > 15* 20* 26  GLUCOSE 144*  < > 162* 157* 136*  BUN 15  < > 32* 38* 40*  CREATININE 0.89  < > 2.23* 2.69* 2.74*  CALCIUM 8.4  < > 6.9* 7.1* 7.3*  GFRNONAA 63*  < > 21* 16* 16*  GFRAA 73*  < > 24* 19* 19*  PROT 6.3  --   --   --   --   ALBUMIN 2.7*  --   --   --   --   AST 26  --   --   --   --   ALT 14  --   --   --   --  ALKPHOS 89  --   --   --   --   BILITOT 1.1  --   --   --   --   < > = values in this interval not displayed.  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 - ADENOCARCINOMA INVADES THROUGH BOWEL WALL TO INVOLVE THE SEROSA OF THE STOMACH. - MESENTERIC MARGIN IS GROSSLY INVOLVED WITH TUMOR. - PROXIMAL AND DISTAL COLONIC MARGINS DEMONSTRATE ACUTE SEROSITIS, BUT ARE NEGATIVE FOR TUMOR. - STOMACH MARGIN IS NEGATIVE FOR TUMOR. - LYMPH/VASCULAR INVASION IS PRESENT WITH 6 OF 13 LYMPH NODES POSITIVE FOR METASTATIC ADENOCARCINOMA (6/13). - EXTRACAPSULAR EXTENSION IS IDENTIFIED. - MULTIPLE (GREATER THAN 5) SATELLITE TUMOR NODULES IDENTIFIED WITHIN THE OMENTUM. - SEE ONCOLOGY TEMPLATE.  ADDITIONAL INFORMATION: Mismatch Repair (MMR) Protein Immunohistochemistry (IHC) IHC Expression Result: MLH1: LOSS OF NUCLEAR EXPRESSION (LESS THAN 5% TUMOR EXPRESSION) MSH2: Preserved nuclear expression (greater 50% tumor expression) MSH6: Preserved nuclear expression (greater 50% tumor expression) PMS2:  LOSS OF NUCLEAR EXPRESSION (LESS THAN 5% TUMOR EXPRESSION) * Internal control demonstrates intact nuclear expression    RADIOGRAPHIC STUDIES: I have personally reviewed the radiological images as listed and agreed with the findings in the report. Dg Chest 1 View  12/17/2014   CLINICAL DATA:  Intubated  EXAM: CHEST  1 VIEW  COMPARISON:  CT chest dated 12/17/2014  FINDINGS: Increased interstitial markings. Mild left basilar atelectasis. Trace left pleural effusion is not evident radiographically.  Endotracheal tube terminates 4.5 cm above the carina. Enteric tube courses into the stomach.  The heart is top-normal in size for  IMPRESSION: Endotracheal tube terminates 4.5 cm above the carina.   Electronically Signed   By: Julian Hy M.D.   On: 12/17/2014 10:57   Ct Chest W Contrast 12/17/2014     IMPRESSION: 1. Large amount of free air and free fluid within the upper abdomen follow-up rarely about the liver, compatible with acute perforation. This likely reflects perforation at the level of the large colonic malignancy, though the site of perforation is not definitely characterized on this study. 2. Large left splenic flexure colonic mass again noted, with diffuse surrounding nodularity, reflecting local spread of disease. Extensive surrounding soft tissue inflammation is mildly more prominent than on the prior study. 3. Small left pleural effusion, new from the recent prior study, likely reactive secondary to bowel perforation. Bibasilar atelectasis noted. Lungs otherwise clear. 4. Scattered coronary artery calcifications seen. 5. Small pericardial effusion again noted. 6. 2.5 cm left adrenal lesion again raises concern for metastasis.  Critical Value/emergent results were called by telephone at the time of interpretation on 12/17/2014 at 5:07 am to Dr. Ralene Ok, who verbally acknowledged these results.   Electronically Signed   By: Garald Balding M.D.   On: 12/17/2014 05:13   Ct Abdomen Pelvis W  Contrast 12/16/2014    FINDINGS: Lower chest: Mild dependent atelectasis at the lung bases. Trace left pleural effusion.  Small pericardial effusion. Coronary atherosclerosis. Atherosclerotic calcifications of the aortic arch/bruit.  Coronary atherosclerosis in the distal esophagus, suggesting gastroesophageal reflux or esophageal dysmotility.  Hepatobiliary: Liver is within normal limits. No suspicious/ enhancing hepatic lesions.  Status post cholecystectomy. No intrahepatic ductal dilatation. Mild prominence of the common duct, likely postsurgical.  Pancreas: Within normal limits.  Spleen: Within normal limits.  Adrenals/Urinary Tract:  2.3 x 1.7 cm left adrenal nodule (series 21/ image 26), indeterminate, suspicious for metastasis given the associated findings.  Kidneys are within normal limits.  No hydronephrosis.  Bladder is underdistended.  Stomach/Bowel: Stomach is notable for a small hiatal hernia.  No evidence of small bowel obstruction.  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  Associated gross pericolonic extension with associated pericolonic soft tissue implants/nodes, for example measuring 1.7 x 2.2 cm medially (series 21/image 28) and 2.1 x 2.6 cm laterally (series 21/ image 25).  Dilated mobile cecum, which is present in the left lower abdomen, measuring up to 11.1 cm (series 21/image 64).  Vascular/Lymphatic: Atherosclerotic calcifications of the abdominal aorta and branch vessels.  Associated mesenteric/ retroperitoneal nodes, including a 10 mm short axis node beneath the left renal vein (series 21/image 42) and a 12 mm short axis node anterior to the pancreatic tail (series 201/ image 29).  Reproductive: Uterus is unremarkable.  Bilateral ovaries are within normal limits.  Other: Associated small volume pelvic ascites.  No free air.  Musculoskeletal: Degenerative  changes of the visualized thoracolumbar spine.  Grade 1 anterolisthesis of L4 on L5.    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  Associated gross pericolonic extension with pericolonic soft tissue implants/nodes, as described above.  Associated mesenteric/retroperitoneal nodal metastases. 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  Associated small volume pelvic ascites.  No free air.  Dilated mobile cecum in the left lower abdomen, measuring up to 11.1 cm.  Trace left pleural effusion.  Small pericardial effusion.   Electronically Signed   By: Julian Hy M.D.   On: 12/16/2014 13:46     ASSESSMENT & PLAN: 74 year old female with past medical history of hypertension, previously very active and healthy, presented with worsening nausea, vomiting and abdominal pain. Scan showed a ruptured colon mass, developed peritonitis and septic shock, multiorgan failure.   1. 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 Overall prognosis is very poor, especially in the setting of multiorgan failure. -We discussed the treatment options, including chemotherapy, surgical debulking with HIPEC, if she is able to recover well, and performance status significantly improve to 1-2. Given the multiorgan failure after surgery, her recovery will be very slowly and difficult, she most likely not be a candidate for chemotherapy. 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 This has  been studied in multiple phase 1 and 2 study, overall response rate is in the range of 60-70%, and durable response (more than two years) has been reported in some patients. However, these agents (Nivolumab and pembrolizumab) has not been approved for colon cancer, but will likely be approved soon. If she is able to recover well, I'll certainly try Nivolumab or pembrolizumab on her.  -We also discussed palliative care alone, and hospice, if she is not able to recover well. -The patient expressed her wishes to battle with her  cancer, and try anticancer treatment if she is able to recover.  2. Peritonitis and sepsis 3. Acute renal failure, improving 4. AF 5. Right foot acute ischemia 6. HTN   RECOMMENDATIONS: -Given her reasonable recovery from septic shock, I suggest to get a vascular surgeon involved for her right foot ischemia -Patient expressed her wish to try anticancer treatment, especially immunotherapy, if she is able to recover. -I'll be happy to see her in 3-4 weeks after her hospital discharge.    All questions were answered. The patient knows to call the clinic with any problems, questions or concerns.   I give patient my contact information, her daughter will call my office to schedule follow-up appointment upon her discharge.   Please call me if there is questions during her hospital stay.    Truitt Merle, MD 12/21/2014 5:29 PM

## 2014-12-21 NOTE — Procedures (Signed)
Extubation Procedure Note  Patient Details:   Name: Kathryn Knapp DOB: 10/30/1940 MRN: [id number]   Airway Documentation:     Evaluation  O2 sats: stable throughout Complications: No apparent complications Patient did tolerate procedure well. Bilateral Breath Sounds: Clear Suctioning: Airway Yes  Extubate per md order to 2lpm.  Duffy Rhody 12/21/2014, 2:08 PM

## 2014-12-21 NOTE — Progress Notes (Signed)
[facility] Progress Note Patient Name: Kathryn Knapp DOB: 23-Feb-1941 MRN: [id number]   Date of Service  12/21/2014  HPI/Events of Note  Hgb 6.2  eICU Interventions  One unit PRBC     Intervention Category Intermediate Interventions: Other:  Merton Border 12/21/2014, 5:33 AM

## 2014-12-21 NOTE — Progress Notes (Signed)
CRITICAL VALUE ALERT  Critical value received:  Hgb 6.2  Date of notification:  12/21/14  Time of notification:  0517  Critical value read back: yes  Nurse who received alert:  A. Saylor Murry RN- relayed information to A. Kennon Portela  MD notified (1st page):   Time of first page:    MD notified (2nd page):  Time of second page:  Responding MD:    Time MD responded:

## 2014-12-21 NOTE — Progress Notes (Signed)
Patient ID: Kathryn Knapp, female   DOB: 11-14-1940, 74 y.o.   MRN: [id number]     [address] [place], [place] [address]    Phone: [phone] FAX: [phone]     Subjective: Given 1u PRBCs for hgb 6.2.  In AF with variant HR.  BP stable.  Afebrile.  WBC trending down.  Adequate UOP sCr 2.7.    Objective:  Vital signs:  Filed Vitals:   12/21/14 0728 12/21/14 0800 12/21/14 0828 12/21/14 0900  BP:  120/57 139/54 149/57  Pulse:  56 51 58  Temp: 98.1 F (36.7 C)  98.3 F (36.8 C)   TempSrc: Oral  Oral   Resp:  _0 Height:      Weight:      SpO2:  100% 100% 100%    Last BM Date: 12/16/14  Intake/Output   Yesterday:  05/04 0701 - 05/05 0700 In: 8616 [I.V.:2615; NG/GT:140; IV Piggyback:250] Out: 8372 [Urine:1380; Emesis/NG output:100; Drains:30] This shift:  Total I/O In: 431.3 [I.V.:101.3; Blood:330] Out: 500 [Urine:500]  Physical Exam: General: Pt awake/alert/oriented x4 in no acute distress Abdomen: Soft.  Nondistended.  appropriately tender.  Midline wound is clean, open.  RLQ ileostomy without output, stoma is pink and viable.  JP drian with serous output.       Problem List:   Principal Problem:   Septic shock Active Problems:   Bowel obstruction   Colonic mass   S/P partial colectomy   Severe sepsis with acute organ dysfunction    Results:   Labs: Results for orders placed or performed during the hospital encounter of 12/16/14 (from the past 48 hour(s))  Glucose, capillary     Status: Abnormal   Collection Time: 12/19/14 12:19 PM  Result Value Ref Range   Glucose-Capillary 110 (H) 70 - 99 mg/dL   Comment 1 Capillary Specimen    Comment 2 Notify RN   Glucose, capillary     Status: Abnormal   Collection Time: 12/19/14  4:30 PM  Result Value Ref Range   Glucose-Capillary 141 (H) 70 - 99 mg/dL  Glucose, capillary     Status: Abnormal   Collection Time: 12/19/14  7:22 PM   Result Value Ref Range   Glucose-Capillary 150 (H) 70 - 99 mg/dL   Comment 1 Capillary Specimen    Comment 2 Notify RN    Comment 3 Document in Chart   Glucose, capillary     Status: Abnormal   Collection Time: 12/19/14  8:41 PM  Result Value Ref Range   Glucose-Capillary 154 (H) 70 - 99 mg/dL   Comment 1 Arterial Specimen   Glucose, capillary     Status: Abnormal   Collection Time: 12/20/14 12:10 AM  Result Value Ref Range   Glucose-Capillary 169 (H) 70 - 99 mg/dL   Comment 1 Capillary Specimen    Comment 2 Notify RN    Comment 3 Document in Chart   Glucose, capillary     Status: Abnormal   Collection Time: 12/20/14  3:49 AM  Result Value Ref Range   Glucose-Capillary 152 (H) 70 - 99 mg/dL   Comment 1 Capillary Specimen    Comment 2 Notify RN    Comment 3 Document in Chart   Basic metabolic panel     Status: Abnormal   Collection Time: 12/20/14  5:00 AM  Result Value Ref Range   Sodium 133 (L) 135 - 145  mmol/L   Potassium 4.0 3.5 - 5.1 mmol/L   Chloride 103 101 - 111 mmol/L   CO2 20 (L) 22 - 32 mmol/L   Glucose, Bld 157 (H) 70 - 99 mg/dL   BUN 38 (H) 6 - 20 mg/dL   Creatinine, Ser 2.69 (H) 0.44 - 1.00 mg/dL   Calcium 7.1 (L) 8.9 - 10.3 mg/dL   GFR calc non Af Amer 16 (L) >60 mL/min   GFR calc Af Amer 19 (L) >60 mL/min    Comment: (NOTE) The eGFR has been calculated using the CKD EPI equation. This calculation has not been validated in all clinical situations. eGFR's persistently <90 mL/min signify possible Chronic Kidney Disease.    Anion gap 10 5 - 15  CBC     Status: Abnormal   Collection Time: 12/20/14  5:00 AM  Result Value Ref Range   WBC 15.7 (H) 4.0 - 10.5 K/uL   RBC 3.31 (L) 3.87 - 5.11 MIL/uL   Hemoglobin 8.3 (L) 12.0 - 15.0 g/dL   HCT 25.4 (L) 36.0 - 46.0 %   MCV 76.7 (L) 78.0 - 100.0 fL   MCH 25.1 (L) 26.0 - 34.0 pg   MCHC 32.7 30.0 - 36.0 g/dL   RDW 16.4 (H) 11.5 - 15.5 %   Platelets 106 (L) 150 - 400 K/uL    Comment: REPEATED TO VERIFY PLATELET  COUNT CONFIRMED BY SMEAR   Glucose, capillary     Status: Abnormal   Collection Time: 12/20/14  8:21 AM  Result Value Ref Range   Glucose-Capillary 135 (H) 70 - 99 mg/dL   Comment 1 Capillary Specimen    Comment 2 Notify RN   Glucose, capillary     Status: Abnormal   Collection Time: 12/20/14 11:47 AM  Result Value Ref Range   Glucose-Capillary 115 (H) 70 - 99 mg/dL   Comment 1 Capillary Specimen    Comment 2 Notify RN   Triglycerides     Status: None   Collection Time: 12/20/14  1:00 PM  Result Value Ref Range   Triglycerides 136 <150 mg/dL  Glucose, capillary     Status: Abnormal   Collection Time: 12/20/14  4:28 PM  Result Value Ref Range   Glucose-Capillary 141 (H) 70 - 99 mg/dL   Comment 1 Capillary Specimen    Comment 2 Notify RN   Glucose, capillary     Status: Abnormal   Collection Time: 12/20/14  7:24 PM  Result Value Ref Range   Glucose-Capillary 124 (H) 70 - 99 mg/dL   Comment 1 Capillary Specimen    Comment 2 Notify RN    Comment 3 Document in Chart   Glucose, capillary     Status: Abnormal   Collection Time: 12/20/14 11:41 PM  Result Value Ref Range   Glucose-Capillary 110 (H) 70 - 99 mg/dL   Comment 1 Capillary Specimen    Comment 2 Notify RN    Comment 3 Document in Chart   Glucose, capillary     Status: Abnormal   Collection Time: 12/21/14  3:32 AM  Result Value Ref Range   Glucose-Capillary 127 (H) 70 - 99 mg/dL   Comment 1 Capillary Specimen    Comment 2 Notify RN    Comment 3 Document in Chart   Basic metabolic panel     Status: Abnormal   Collection Time: 12/21/14  4:30 AM  Result Value Ref Range   Sodium 140 135 - 145 mmol/L    Comment: DELTA CHECK NOTED  Potassium 3.7 3.5 - 5.1 mmol/L   Chloride 103 101 - 111 mmol/L   CO2 26 22 - 32 mmol/L   Glucose, Bld 136 (H) 70 - 99 mg/dL   BUN 40 (H) 6 - 20 mg/dL   Creatinine, Ser 2.74 (H) 0.44 - 1.00 mg/dL   Calcium 7.3 (L) 8.9 - 10.3 mg/dL   GFR calc non Af Amer 16 (L) >60 mL/min   GFR calc Af  Amer 19 (L) >60 mL/min    Comment: (NOTE) The eGFR has been calculated using the CKD EPI equation. This calculation has not been validated in all clinical situations. eGFR's persistently <90 mL/min signify possible Chronic Kidney Disease.    Anion gap 11 5 - 15  CBC with Differential/Platelet     Status: Abnormal   Collection Time: 12/21/14  4:30 AM  Result Value Ref Range   WBC 14.0 (H) 4.0 - 10.5 K/uL   RBC 2.48 (L) 3.87 - 5.11 MIL/uL   Hemoglobin 6.2 (LL) 12.0 - 15.0 g/dL    Comment: CRITICAL RESULT CALLED TO, READ BACK BY AND VERIFIED WITH: HAGGARD,A RN 12/21/2014 0517 JORDANS REPEATED TO VERIFY    HCT 18.7 (L) 36.0 - 46.0 %   MCV 75.4 (L) 78.0 - 100.0 fL   MCH 25.0 (L) 26.0 - 34.0 pg   MCHC 33.2 30.0 - 36.0 g/dL   RDW 16.3 (H) 11.5 - 15.5 %   Platelets 105 (L) 150 - 400 K/uL   Neutrophils Relative % 87 (H) 43 - 77 %   Neutro Abs 12.2 (H) 1.7 - 7.7 K/uL   Lymphocytes Relative 5 (L) 12 - 46 %   Lymphs Abs 0.7 0.7 - 4.0 K/uL   Monocytes Relative 8 3 - 12 %   Monocytes Absolute 1.1 (H) 0.1 - 1.0 K/uL   Eosinophils Relative 0 0 - 5 %   Eosinophils Absolute 0.0 0.0 - 0.7 K/uL   Basophils Relative 0 0 - 1 %   Basophils Absolute 0.0 0.0 - 0.1 K/uL  Prepare RBC     Status: None   Collection Time: 12/21/14  5:45 AM  Result Value Ref Range   Order Confirmation ORDER PROCESSED BY BLOOD BANK   Type and screen     Status: None (Preliminary result)   Collection Time: 12/21/14  5:45 AM  Result Value Ref Range   ABO/RH(D) A POS    Antibody Screen NEG    Sample Expiration 12/24/2014    Unit Number [id number]    Blood Component Type RED CELLS,LR    Unit division 00    Status of Unit ISSUED    Transfusion Status OK TO TRANSFUSE    Crossmatch Result Compatible   Glucose, capillary     Status: Abnormal   Collection Time: 12/21/14  8:19 AM  Result Value Ref Range   Glucose-Capillary 117 (H) 70 - 99 mg/dL   Comment 1 Notify RN     Imaging / Studies: Dg Chest Port 1  View  12/21/2014   CLINICAL DATA:  Respiratory failure  EXAM: PORTABLE CHEST - 1 VIEW  COMPARISON:  12/19/2014  FINDINGS: Endotracheal tube is 4.4 cm above the carina. The nasogastric tube extends into the stomach. The right jugular central line extends to the cavoatrial junction. Consolidation and effusion persist in the left base without significant interval change. The right lung remains clear except for minimal linear scarring or atelectasis.  IMPRESSION: Support equipment appears satisfactorily positioned.  Unchanged left base consolidation and effusion.   Electronically  Signed   By: Andreas Newport M.D.   On: 12/21/2014 06:33    Medications / Allergies:  Scheduled Meds: . sodium chloride   Intravenous Once  . antiseptic oral rinse  7 mL Mouth Rinse QID  . chlorhexidine  15 mL Mouth Rinse BID  . fluconazole (DIFLUCAN) IV  200 mg Intravenous Q24H  . heparin  5,000 Units Subcutaneous 3 times per day  . hydrocortisone sodium succinate  50 mg Intravenous Q12H  . insulin aspart  2-6 Units Subcutaneous 6 times per day  . metoprolol  5 mg Intravenous 4 times per day  . pantoprazole (PROTONIX) IV  40 mg Intravenous Q24H  . piperacillin-tazobactam (ZOSYN)  IV  3.375 g Intravenous Q8H   Continuous Infusions: . sodium chloride 20 mL/hr at 12/19/14 0400   PRN Meds:.albuterol, fentaNYL (SUBLIMAZE) injection, hydrALAZINE, promethazine  Antibiotics: Anti-infectives    Start     Dose/Rate Route Frequency Ordered Stop   12/19/14 1600  fluconazole (DIFLUCAN) IVPB 200 mg     200 mg 100 mL/hr over 60 Minutes Intravenous Every 24 hours 12/19/14 0901     12/18/14 1100  fluconazole (DIFLUCAN) IVPB 400 mg  Status:  Discontinued     400 mg 100 mL/hr over 120 Minutes Intravenous Every 24 hours 12/18/14 1036 12/19/14 0901   12/17/14 1100  piperacillin-tazobactam (ZOSYN) IVPB 3.375 g     3.375 g 12.5 mL/hr over 240 Minutes Intravenous Every 8 hours 12/17/14 0956     12/17/14 0600  cefoTEtan (CEFOTAN) 2 g  in dextrose 5 % 50 mL IVPB     2 g 100 mL/hr over 30 Minutes Intravenous To Surgery 12/17/14 0542 12/17/14 0726        Assessment/Plan Perforation of cecum, large obstructing tumor at the splenic flexure POD#4 exploratory laparotomy right, transverse, partial 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 Grandville Silos -plan to extubate today per CCM.   -may give sips of clears if extubated -WOC consult -BID wet to dry dressing changes  -drain care  -path-adeno with mets.  Not sure if this has been discussed with the family.  Will clarify.  ID-fluconazole D#3 and zosyn D#4  for peritonitis  VTE prophylaxis-SCD/heparin FEN-sips of clears if extubated today  Erby Pian, [facility] Pager [phone](7A-4:30P) For consults and floor pages call [phone](7A-4:30P)  12/21/2014 10:00 AM

## 2014-12-21 NOTE — Progress Notes (Signed)
CRITICAL VALUE ALERT  Critical value received:  Hgb 6.2  Date of notification:  12/21/14  Time of notification:  0517  Critical value read back: yes  Nurse who received alert:  Emerson Monte RN gave information to Allen Derry, RN  A. Quillian Quince called [facility] and spoke with MD [facility] who ordered 1 unit PRBC.  RN obtained consent from patient's daughter via the telephone with A. Haggard to verify the consent, blood was obtained and started.  WIll continue to monitor.

## 2014-12-21 NOTE — Progress Notes (Signed)
Patient went into A. Fib with rates between 90-120.  Blood pressure sustaining with a systolic 937-902.  MD notified, no further orders at this time.  Will continue to monitor.

## 2014-12-21 NOTE — Progress Notes (Addendum)
[facility] PCCM  Extubated well  I updated her with diagnosis, poor prognosis.  She wants to discuss treatment options with an oncologist.  Wants to talk to her daughters about her foot.  Does NOT want to be re-intubated, will change code status to DNR  Will consult oncology Will place on heparin gtt + ASA until they decide about ischemic foot.  Roselie Awkward, MD [facility] Pager: [phone] Cell: [phone] If no response, call [phone]

## 2014-12-21 NOTE — Progress Notes (Signed)
   Daily Progress Note  Assessment/Planning: Metastatic colon cancer, s/p Right, transverse, partial left colectomy, partial gastrectomy, and end ileostomy for perforated cecum with large malignant splenic flexure mass invading stomach and diaphragm, BLE ischemia   No signals in right foot.  Left foot with strong DP signal today  Given the poor prognosis with stage IV cancer, I don't feel strongly that the right foot needs further diagnostics much less intervention.  If further diagnostic work-up is desired, I would start with BLE ABI and RLE arterial duplex.  I don't think think the patient is stable enough to go an angiographic suite yet.   Subjective    Nearly off vasopressors, still sedated and intubated  Objective Filed Vitals:   12/21/14 0828 12/21/14 0900 12/21/14 1000 12/21/14 1128  BP: 139/54 149/57 111/53 167/52  Pulse: 51 58 55   Temp: 98.3 F (36.8 C)     TempSrc: Oral     Resp: 15 14 15    Height:      Weight:      SpO2: 100% 100% 100%     Intake/Output Summary (Last 24 hours) at 12/21/14 1137 Last data filed at 12/21/14 1019  Gross per 24 hour  Intake 2996.75 ml  Output   1940 ml  Net 1056.75 ml   VASC  R foot cold, warm from calf up, no signals in right foot, no rigor mortis in right foot; cool L foot, no palpable pulses in L foot but strong DP signal  Laboratory CBC    Component Value Date/Time   WBC 14.0* 12/21/2014 0430   HGB 6.2* 12/21/2014 0430   HCT 18.7* 12/21/2014 0430   PLT 105* 12/21/2014 0430    BMET    Component Value Date/Time   NA 140 12/21/2014 0430   K 3.7 12/21/2014 0430   CL 103 12/21/2014 0430   CO2 26 12/21/2014 0430   GLUCOSE 136* 12/21/2014 0430   BUN 40* 12/21/2014 0430   CREATININE 2.74* 12/21/2014 0430   CALCIUM 7.3* 12/21/2014 0430   GFRNONAA 16* 12/21/2014 0430   GFRAA 19* 12/21/2014 0430    Adele Barthel, MD [facility]: [phone] Pager: [phone]  12/21/2014,  11:37 AM

## 2014-12-21 NOTE — Progress Notes (Signed)
ANTIBIOTIC CONSULT NOTE - FOLLOW UP  Pharmacy Consult for Fluconazole Indication: peritonitis  No Known Allergies  Patient Measurements: Height: 5\' 9"  (175.3 cm) Weight: 187 lb 9.8 oz (85.1 kg) IBW/kg (Calculated) : 66.2  Vital Signs: Temp: 98.3 F (36.8 C) (05/05 0828) Temp Source: Oral (05/05 0828) BP: 139/54 mmHg (05/05 0828) Pulse Rate: 51 (05/05 0828) Intake/Output from previous day: 05/04 0701 - 05/05 0700 In: 3005 [I.V.:2615; NG/GT:140; IV Piggyback:250] Out: 1510 [Urine:1380; Emesis/NG output:100; Drains:30] Intake/Output from this shift: Total I/O In: 431.3 [I.V.:101.3; Blood:330] Out: 225 [Urine:225]  Labs:  Recent Labs  12/19/14 0330 12/20/14 0500 12/21/14 0430  WBC 20.5* 15.7* 14.0*  HGB 9.3* 8.3* 6.2*  PLT 158 106* 105*  CREATININE 2.23* 2.69* 2.74*   Estimated Creatinine Clearance: 21.3 mL/min (by C-G formula based on Cr of 2.74).  Assessment: 74yof continues on day #4/5 fluconazole and day #5/7 zosyn for septic shock 2/2 peritonitis. Septic shock improving, however, renal function continues to worsen - fluconazole dose adjusted accordingly. CCM planning 5 days for fluconazole and 7 days for zosyn.  5/1 Zosyn>> 5/2 Fluconazole>>  5/1 BCx2 - ngtd  Goal of Therapy:  Appropriate dosing  Plan:  1) Continue fluconazole 200mg  IV q24 2) Continue zosyn 3.375g IV q8 (4 hour infusion)   Deboraha Sprang 12/21/2014,8:59 AM

## 2014-12-21 NOTE — Consult Note (Signed)
ANTICOAGULATION CONSULT NOTE - Initial Consult  Pharmacy Consult for Heparin Indication: RLE ischemia  No Known Allergies  Patient Measurements: Height: 5\' 9"  (175.3 cm) Weight: 187 lb 9.8 oz (85.1 kg) IBW/kg (Calculated) : 66.2 Heparin Dosing Weight: ~83kg  Vital Signs: Temp: 97.8 F (36.6 C) (05/05 1200) Temp Source: Axillary (05/05 1200) BP: 145/71 mmHg (05/05 1400) Pulse Rate: 67 (05/05 1406)  Labs:  Recent Labs  12/19/14 0330 12/20/14 0500 12/21/14 0430  HGB 9.3* 8.3* 6.2*  HCT 29.4* 25.4* 18.7*  PLT 158 106* 105*  CREATININE 2.23* 2.69* 2.74*    Estimated Creatinine Clearance: 21.3 mL/min (by C-G formula based on Cr of 2.74).   Medical History: Past Medical History  Diagnosis Date  . Diabetes mellitus without complication   . Hypertension     Assessment: 74yof known to pharmacy from antibiotic dosing now to begin IV heparin for RLE ischemia. She was on sq heparin for DVT prophylaxis with last dose given at 1416. Will avoid bolus, but do not want to delay start of therapeutic heparin given possibility of LE clot. Her platelets have been decreasing over the last several days but appear to be stabilizing today. Her Hgb also dropped to 6.2 and she received 1 unit PRBC. Will watch her CBC closely.  Goal of Therapy:  Heparin level 0.3-0.7 units/ml Monitor platelets by anticoagulation protocol: Yes   Plan:  1) Begin heparin at 1300 units/hr with no bolus 2) Check 8 hour heparin level 3) Daily heparin level and CBC  Deboraha Sprang 12/21/2014,3:11 PM

## 2014-12-22 ENCOUNTER — Inpatient Hospital Stay (HOSPITAL_COMMUNITY): Payer: Medicare Other

## 2014-12-22 DIAGNOSIS — I10 Essential (primary) hypertension: Secondary | ICD-10-CM

## 2014-12-22 DIAGNOSIS — E119 Type 2 diabetes mellitus without complications: Secondary | ICD-10-CM

## 2014-12-22 DIAGNOSIS — M79671 Pain in right foot: Secondary | ICD-10-CM

## 2014-12-22 DIAGNOSIS — M79609 Pain in unspecified limb: Secondary | ICD-10-CM

## 2014-12-22 DIAGNOSIS — C786 Secondary malignant neoplasm of retroperitoneum and peritoneum: Secondary | ICD-10-CM

## 2014-12-22 DIAGNOSIS — C184 Malignant neoplasm of transverse colon: Secondary | ICD-10-CM

## 2014-12-22 DIAGNOSIS — I998 Other disorder of circulatory system: Secondary | ICD-10-CM

## 2014-12-22 DIAGNOSIS — I4891 Unspecified atrial fibrillation: Secondary | ICD-10-CM

## 2014-12-22 DIAGNOSIS — C7972 Secondary malignant neoplasm of left adrenal gland: Secondary | ICD-10-CM

## 2014-12-22 LAB — CBC
HCT: 27.6 % — ABNORMAL LOW (ref 36.0–46.0)
Hemoglobin: 9.1 g/dL — ABNORMAL LOW (ref 12.0–15.0)
MCH: 25.9 pg — AB (ref 26.0–34.0)
MCHC: 33 g/dL (ref 30.0–36.0)
MCV: 78.6 fL (ref 78.0–100.0)
Platelets: 92 10*3/uL — ABNORMAL LOW (ref 150–400)
RBC: 3.51 MIL/uL — ABNORMAL LOW (ref 3.87–5.11)
RDW: 16.5 % — AB (ref 11.5–15.5)
WBC: 8.9 10*3/uL (ref 4.0–10.5)

## 2014-12-22 LAB — BASIC METABOLIC PANEL
ANION GAP: 10 (ref 5–15)
BUN: 45 mg/dL — ABNORMAL HIGH (ref 6–20)
CALCIUM: 7.3 mg/dL — AB (ref 8.9–10.3)
CO2: 28 mmol/L (ref 22–32)
Chloride: 102 mmol/L (ref 101–111)
Creatinine, Ser: 2.74 mg/dL — ABNORMAL HIGH (ref 0.44–1.00)
GFR, EST AFRICAN AMERICAN: 19 mL/min — AB (ref >60)
GFR, EST NON AFRICAN AMERICAN: 16 mL/min — AB (ref >60)
GLUCOSE: 118 mg/dL — AB (ref 70–99)
POTASSIUM: 3.2 mmol/L — AB (ref 3.5–5.1)
SODIUM: 140 mmol/L (ref 135–145)

## 2014-12-22 LAB — TYPE AND SCREEN
ABO/RH(D): A POS
Antibody Screen: NEGATIVE
Unit division: 0

## 2014-12-22 LAB — HEPARIN LEVEL (UNFRACTIONATED)
Heparin Unfractionated: 0.5 IU/mL (ref 0.30–0.70)
Heparin Unfractionated: 0.58 IU/mL (ref 0.30–0.70)

## 2014-12-22 LAB — GLUCOSE, CAPILLARY
GLUCOSE-CAPILLARY: 117 mg/dL — AB (ref 70–99)
GLUCOSE-CAPILLARY: 96 mg/dL (ref 70–99)
Glucose-Capillary: 113 mg/dL — ABNORMAL HIGH (ref 70–99)
Glucose-Capillary: 94 mg/dL (ref 70–99)
Glucose-Capillary: 83 mg/dL (ref 70–99)

## 2014-12-22 MED ORDER — PIPERACILLIN-TAZOBACTAM IN DEX 2-0.25 GM/50ML IV SOLN
2.2500 g | Freq: Four times a day (QID) | INTRAVENOUS | Status: AC
  Administered 2014-12-22 – 2014-12-23 (×7): 2.25 g via INTRAVENOUS
  Filled 2014-12-22 (×7): qty 50

## 2014-12-22 MED ORDER — AMLODIPINE BESYLATE 5 MG PO TABS
5.0000 mg | ORAL_TABLET | Freq: Every day | ORAL | Status: DC
  Administered 2014-12-22 – 2014-12-25 (×3): 5 mg via ORAL
  Filled 2014-12-22 (×4): qty 1

## 2014-12-22 MED ORDER — POTASSIUM CHLORIDE 10 MEQ/50ML IV SOLN
10.0000 meq | INTRAVENOUS | Status: AC
  Administered 2014-12-22 (×3): 10 meq via INTRAVENOUS
  Filled 2014-12-22 (×3): qty 50

## 2014-12-22 MED ORDER — INSULIN ASPART 100 UNIT/ML ~~LOC~~ SOLN
0.0000 [IU] | SUBCUTANEOUS | Status: DC
  Administered 2014-12-24 – 2014-12-29 (×12): 1 [IU] via SUBCUTANEOUS
  Administered 2014-12-29: 2 [IU] via SUBCUTANEOUS
  Administered 2014-12-29 – 2014-12-30 (×3): 1 [IU] via SUBCUTANEOUS
  Administered 2014-12-31: 2 [IU] via SUBCUTANEOUS

## 2014-12-22 NOTE — Consult Note (Signed)
ANTICOAGULATION CONSULT NOTE -follow up Pharmacy Consult for Heparin Indication: RLE ischemia  No Known Allergies  Patient Measurements: Height: 5\' 9"  (175.3 cm) Weight: 187 lb 9.8 oz (85.1 kg) IBW/kg (Calculated) : 66.2 Heparin Dosing Weight: ~83kg  Vital Signs: Temp: 97.6 F (36.4 C) (05/05 2339) Temp Source: Oral (05/05 2339) BP: 105/45 mmHg (05/06 0200) Pulse Rate: 49 (05/06 0200)  Labs:  Recent Labs  12/19/14 0330 12/20/14 0500 12/21/14 0430 12/21/14 2330  HGB 9.3* 8.3* 6.2*  --   HCT 29.4* 25.4* 18.7*  --   PLT 158 106* 105*  --   HEPARINUNFRC  --   --   --  0.50  CREATININE 2.23* 2.69* 2.74*  --     Estimated Creatinine Clearance: 21.3 mL/min (by C-G formula based on Cr of 2.74).   Medical History: Past Medical History  Diagnosis Date  . Diabetes mellitus without complication   . Hypertension     Assessment: Initial 8 h heparin level = 0.5 on IV heparin drip at 1300 units/hr for RLE ischemia in this  74yo female. Noted that her platelets have been decreasing over the last several days but appear to be stabilizing yesterday. Her Hgb also dropped to 6.2 and she received 1 unit PRBC. Will watch her CBC closely.   Goal of Therapy:  Heparin level 0.3-0.7 units/ml Monitor platelets by anticoagulation protocol: Yes   Plan:  Continue heparin at 1300 units/hr with no bolus F/u 5 AM heparin level Daily heparin level and CBC   Thank you for allowing pharmacy to be part of this patients care team. Nicole Cella, RPh Clinical Pharmacist Pager: [phone] 12/22/2014,2:12 AM

## 2014-12-22 NOTE — Progress Notes (Signed)
5 Days Post-Op  Subjective: Tolerating extubation Minimal abdominal pain Does have right lower leg pain  Talked with oncology yesterday  Objective: Vital signs in last 24 hours: Temp:  [96.7 F (35.9 C)-97.8 F (36.6 C)] 97.5 F (36.4 C) (05/06 0749) Pulse Rate:  [47-70] 60 (05/06 0600) Resp:  [8-21] 18 (05/06 0600) BP: (96-167)/(42-83) 131/46 mmHg (05/06 0600) SpO2:  [94 %-100 %] 94 % (05/06 0600) Arterial Line BP: (109-172)/(36-61) 166/53 mmHg (05/06 0600) FiO2 (%):  [40 %] 40 % (05/05 1128) Last BM Date: 12/16/14  Intake/Output from previous day: 05/05 0701 - 05/06 0700 In: 1704.5 [I.V.:1034.5; Blood:330; IV Piggyback:300] Out: 2375 [Urine:2295; Emesis/NG output:50; Drains:30] Intake/Output this shift:    Abdomen soft, wound clean Drain serous Ostomy with output, viable  Lab Results:   Recent Labs  12/21/14 0430 12/22/14 0400  WBC 14.0* 8.9  HGB 6.2* 9.1*  HCT 18.7* 27.6*  PLT 105* 92*   BMET  Recent Labs  12/21/14 0430 12/22/14 0400  NA 140 140  K 3.7 3.2*  CL 103 102  CO2 26 28  GLUCOSE 136* 118*  BUN 40* 45*  CREATININE 2.74* 2.74*  CALCIUM 7.3* 7.3*   PT/INR No results for input(s): LABPROT, INR in the last 72 hours. ABG No results for input(s): PHART, HCO3 in the last 72 hours.  Invalid input(s): PCO2, PO2  Studies/Results: Dg Chest Port 1 View  12/21/2014   CLINICAL DATA:  Respiratory failure  EXAM: PORTABLE CHEST - 1 VIEW  COMPARISON:  12/19/2014  FINDINGS: Endotracheal tube is 4.4 cm above the carina. The nasogastric tube extends into the stomach. The right jugular central line extends to the cavoatrial junction. Consolidation and effusion persist in the left base without significant interval change. The right lung remains clear except for minimal linear scarring or atelectasis.  IMPRESSION: Support equipment appears satisfactorily positioned.  Unchanged left base consolidation and effusion.   Electronically Signed   By: Andreas Newport  M.D.   On: 12/21/2014 06:33    Anti-infectives: Anti-infectives    Start     Dose/Rate Route Frequency Ordered Stop   12/19/14 1600  fluconazole (DIFLUCAN) IVPB 200 mg     200 mg 100 mL/hr over 60 Minutes Intravenous Every 24 hours 12/19/14 0901     12/18/14 1100  fluconazole (DIFLUCAN) IVPB 400 mg  Status:  Discontinued     400 mg 100 mL/hr over 120 Minutes Intravenous Every 24 hours 12/18/14 1036 12/19/14 0901   12/17/14 1100  piperacillin-tazobactam (ZOSYN) IVPB 3.375 g     3.375 g 12.5 mL/hr over 240 Minutes Intravenous Every 8 hours 12/17/14 0956     12/17/14 0600  cefoTEtan (CEFOTAN) 2 g in dextrose 5 % 50 mL IVPB     2 g 100 mL/hr over 30 Minutes Intravenous To Surgery 12/17/14 0542 12/17/14 0726      Assessment/Plan: s/p Procedure(s): EXPLORATORY LAPAROTOMY Right, Transverse, partial left colectomy with en bloc partial gastrectomy illeostomy (N/A)  Will d/c NG, continue npo with ice chips.  May be able to start clear liquids tomorrow  Still with elevated creatinine and low GFR, ischemic right lower extremity. She is improving, but prognosis remains poor to guarded  LOS: 6 days    Kahleah Crass A 12/22/2014

## 2014-12-22 NOTE — Progress Notes (Signed)
ANTIBIOTIC CONSULT NOTE - INITIAL  Pharmacy Consult for Zosyn Indication: peritonitis  No Known Allergies  Patient Measurements: Height: 5\' 9"  (175.3 cm) Weight: 187 lb 9.8 oz (85.1 kg) IBW/kg (Calculated) : 66.2  Vital Signs: Temp: 97.5 F (36.4 C) (05/06 0749) Temp Source: Oral (05/06 0749) BP: 139/55 mmHg (05/06 0900) Pulse Rate: 69 (05/06 0900) Intake/Output from previous day: 05/05 0701 - 05/06 0700 In: 1704.5 [I.V.:1034.5; Blood:330; IV [id number]:537] Out: 2375 [Urine:2295; Emesis/NG output:50; Drains:30] Intake/Output from this shift: Total I/O In: 116 [I.V.:66; IV Piggyback:50] Out: 100 [Urine:100]  Labs:  Recent Labs  12/20/14 0500 12/21/14 0430 12/22/14 0400  WBC 15.7* 14.0* 8.9  HGB 8.3* 6.2* 9.1*  PLT 106* 105* 92*  CREATININE 2.69* 2.74* 2.74*   Estimated Creatinine Clearance: 21.3 mL/min (by C-G formula based on Cr of 2.74).  Assessment: 74yof continues on day #4/5 fluconazole and day #6/7 zosyn for septic shock 2/2 peritonitis. Septic shock improving, however, renal function continues to worsen - fluconazole dose adjusted accordingly. CCM planning 5 days for fluconazole and 7 days for zosyn.  Pt remains afebrile, WBC now wnl, sCr continues to trend up to 2.7. Will adjust zosyn due to renal function.  5/1 Zosyn>>  (5/7) 5/2 Fluconazole>>  5/1 BCx2 - ngtd  Goal of Therapy:  Appropriate dosing  Plan:  Change zosyn to 2.25g IV q6h for 7 days today treatment Continue fluconazole 200mg  IV q24h  Andrey Cota. Diona Foley, PharmD Clinical Pharmacist Pager [phone] 12/22/2014,9:50 AM

## 2014-12-22 NOTE — Progress Notes (Signed)
Pt seen-she is now extubated and off pressors.  She now has a 2+ palpable left DP pulse.  Her right foot remains cold with ischemic changes.   Her creatinine has been 2.7 the past two days and therefore not a candidate for angiography at this time.    Will order ABI's and RLE arterial duplex to assess her situation.    Dr. Trula Slade to see pt over the weekend.   Leontine Locket 12/22/2014 9:23 AM

## 2014-12-22 NOTE — Progress Notes (Addendum)
VASCULAR LAB PRELIMINARY  ARTERIAL  ABI completed:    RIGHT    LEFT    PRESSURE WAVEFORM  PRESSURE WAVEFORM  BRACHIAL  triphasic BRACHIAL 142 triphasic  DP   DP    AT  absent AT 151 triphasic  PT  absent PT 161 triphasic  PER   PER    GREAT TOE  absent GREAT TOE 54 NA    RIGHT LEFT  ABI Not ascertained due to absent pedal pulses >1.0 and TBI of 0.38     Guinevere Ferrari, RVT 12/22/2014, 1:17 PM

## 2014-12-22 NOTE — Progress Notes (Signed)
[place] Progress Note Patient Name: CALENA SALEM DOB: Jul 06, 1941 MRN: [id number]   Date of Service  12/22/2014  HPI/Events of Note    eICU Interventions  K replaced     Intervention Category Minor Interventions: Electrolytes abnormality - evaluation and management  BYRUM,ROBERT S. 12/22/2014, 6:16 AM

## 2014-12-22 NOTE — Progress Notes (Addendum)
Nutrition Follow-up  DOCUMENTATION CODES:  Not applicable  INTERVENTION:  Advance diet as medically appropriate, RD to add interventions accordingly  NUTRITION DIAGNOSIS:  Inadequate oral intake related to inability to eat as evidenced by NPO status, ongoing  GOAL:  Patient will meet greater than or equal to 90% of their needs, currently unmet  MONITOR:  Diet advancement, PO intake, Supplement acceptance, Labs, Weight trends, I & O's  ASSESSMENT: 74 year old with a remote history of HTN and diabetes who presents with a two-week history of lower abdominal pain. CT abdomen revealed large bowel obstruction and colonic mass.  Patient s/p procedure 5/1: EXPLORATORY LAPAROTOMY LEFT COLECTOMY PARTIAL GASTRECTOMY END ILEOSTOMY  Patient extubated 5/5.  CWOCN note reviewed 5/4.  Pt with DTI to sacrum.  Patient discussed in ICU rounds.    NGT discontinued.  Plan is for possible PO diet advancement tomorrow.  SLP to defer swallow evaluation.  Nutrient needs are increased given catabolic illness/wound.  Would benefit from oral nutrition supplements when/as able.  Height:  Ht Readings from Last 1 Encounters:  12/17/14 5\' 9"  (1.753 m)    Weight:  Wt Readings from Last 1 Encounters:  12/21/14 187 lb 9.8 oz (85.1 kg)    Ideal Body Weight:  66 kg  Wt Readings from Last 10 Encounters:  12/21/14 187 lb 9.8 oz (85.1 kg)  04/14/13 198 lb (89.812 kg)  09/21/12 192 lb (87.091 kg)    BMI:  Body mass index is 27.69 kg/(m^2).  Re-estimated Nutritional Needs:  Kcal:  2000-2200  Protein:  115-125 gm  Fluid:  per MD  Skin:  Wound (see comment) (DTI to sacrum)  Diet Order:  NPO  EDUCATION NEEDS:  No education needs identified at this time   Intake/Output Summary (Last 24 hours) at 12/22/14 1207 Last data filed at 12/22/14 1000  Gross per 24 hour  Intake 1422.27 ml  Output   1540 ml  Net -117.73 ml    Last BM:  unknown  Arthur Holms, RD, LDN Pager #:  [phone] After-Hours Pager #: [phone]

## 2014-12-22 NOTE — Progress Notes (Signed)
   Daily Progress Note  As I have previous noted, patient is NOT a surgical candidate in the near future.  I'm not even certain she is stable enough for angiographic studies.  Given her ARF (Cr 2.74), I doubt that the risk of contrast induce nephropathy is justified.  As I previously recommend: BLE ABI, BLE arterial duplex.  Unfortunately, if her ischemia progress, she will likely need to consider amputation.  In a Stage IV cancer patient, I do not routinely recommend an aggressive approach, rather a palliative approach.  Adele Barthel, MD [facility] Office: [phone] Pager: [phone]  12/22/2014, 9:07 AM

## 2014-12-22 NOTE — Progress Notes (Signed)
Discussed with Dr. Bridgett Larsson.  Further recommendations to follow.  Kathryn Knapp 12/22/2014 2:18 PM

## 2014-12-22 NOTE — Progress Notes (Signed)
SLP Cancellation Note  Patient Details Name: Kathryn Knapp MRN: [id number] DOB: 09/23/1940   Cancelled evaluation:     Per surgery, pt should continue with ice chips today; start clear liquids possibly tomorrow.  Will defer swallow evaluation until then.  D/w Dr. Lake Bells.      Juan Quam Laurice 12/22/2014, 9:26 AM

## 2014-12-22 NOTE — Consult Note (Signed)
ANTICOAGULATION CONSULT NOTE -follow up Pharmacy Consult for Heparin Indication: RLE ischemia  No Known Allergies  Patient Measurements: Height: 5\' 9"  (175.3 cm) Weight: 187 lb 9.8 oz (85.1 kg) IBW/kg (Calculated) : 66.2 Heparin Dosing Weight: ~83kg  Vital Signs: Temp: 97.5 F (36.4 C) (05/06 0749) Temp Source: Oral (05/06 0749) BP: 139/55 mmHg (05/06 0900) Pulse Rate: 69 (05/06 0900)  Labs:  Recent Labs  12/20/14 0500 12/21/14 0430 12/21/14 2330 12/22/14 0400  HGB 8.3* 6.2*  --  9.1*  HCT 25.4* 18.7*  --  27.6*  PLT 106* 105*  --  92*  HEPARINUNFRC  --   --  0.50 0.58  CREATININE 2.69* 2.74*  --  2.74*    Estimated Creatinine Clearance: 21.3 mL/min (by C-G formula based on Cr of 2.74).   Medical History: Past Medical History  Diagnosis Date  . Diabetes mellitus without complication   . Hypertension     Assessment: Initial 8 h heparin level = 0.5 on IV heparin drip at 1300 units/hr for RLE ischemia in this  74yo female. Noted that her platelets have been decreasing over the last several days but appear to be stabilizing yesterday. Her Hgb also dropped to 6.2 and she received 1 unit PRBC. Will watch her CBC closely.   Confirmatory HL remains therapeutic at 0.58 on heparin 1300 units/hr. No bleeding   Goal of Therapy:  Heparin level 0.3-0.7 units/ml Monitor platelets by anticoagulation protocol: Yes   Plan:  Continue heparin 1300 units/hr Daily heparin level and CBC Monitor s/sx of bleeding  Andrey Cota. Diona Foley, PharmD Clinical Pharmacist Pager [phone] 12/22/2014,9:52 AM

## 2014-12-22 NOTE — Progress Notes (Signed)
*  PRELIMINARY RESULTS* Vascular Ultrasound Right Lower Extremity Arterial Duplex has been completed.  There is no obvious evidence of hemodynamically significant stenosis of the right lower extremity.   The popliteal artery exhibits biphasic flow, the proximal tibioperoneal trunk exhibits slightly dampened biphasic versus monophasic flow. Just past the proximal segment of the tibioperoneal trunk the waveform is reduced to a thump. The proximal posterior tibial artery exhibits no apparent waveform, suggestive of occlusion. The distal posterior tibial artery exhibits homogenous echoes within the vessel lumen that is mobile; this is suggestive of thombus.   Unable to obtain flow in the dorsalis pedis artery. Unable to visualize the peroneal artery.  Preliminary results discussed with Leontine Locket, PA-C.  12/22/2014 11:47 AM Maudry Mayhew, RVT, RDCS, RDMS

## 2014-12-22 NOTE — Clinical Documentation Improvement (Signed)
Supporting Information: Patient with anemia of chronic disease per 4/30 progress notes.  Labs:   H/H: 5/02:  10.5/32.1 5/05:   6.2/18.7  Treatment: 5/05:  Transfuse PRBC.   Possible Clinical Condition: . Documentation of Anemia should include the type of anemia: --Nutritional --Acute blood loss anemia on anemia of chronic disease --Acute on chronic blood loss anemia --Acute blood loss anemia --Other (please specify) . Include in documentation if Anemia is due to nutrition or mineral deficits, resulting in a nutritional anemia . Document if the Anemia is due to a neoplasm (primary and/or secondary) . Document whether the ANEMIA is "related to or due to" chemo or radiotherapy treatments . Document any "cause-and-effect" relationship between the intervention and the blood or immune disorder . Document the specific drug if anemia is drug-induced . Link any laboratory findings to a related diagnosis (if appropriate) . Document any associated diagnoses/conditions    Thank Sherian Maroon Documentation Specialist [phone] [email or web address] [email or web address]

## 2014-12-22 NOTE — Progress Notes (Signed)
PULMONARY / CRITICAL CARE MEDICINE   Name: Kathryn Knapp MRN: [id number] DOB: Sep 17, 1940    ADMISSION DATE:  12/16/2014 CONSULTATION DATE:  Alphonzo Grieve  REFERRING MD :  Grandville Silos   CHIEF COMPLAINT:  Vent management   INITIAL PRESENTATION:  74 yo female with hx HTN, DM presented initially 4/30 with abd pain, n/v and weight loss.  CT abdomen revealed large bowel obstruction and colonic mass.  She was prepped for colonoscopy for decompression prior to elective surgery.  However a CT chest to look for metastasis revealed large amount free air and pt was taken urgently to OR.   Large amount of stool noted with concern for significant peritonitis/ sepsis and pt was left intubated post op.  PCCM consulted to assist with vent management.    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 Associated mesenteric/retroperitoneal nodal metastases. 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 5/2 Echo> LVH, LVEF 55%, normal RV size and function, trace pericardial effusion  SIGNIFICANT EVENTS: 5/1 OR>> exp lap,  Partial colectomy with ileostomy r/t perforated bowel  5/3 Cyanosis R foot, worsening renal failure, made DNR 5/5 Off pressors, UOP up, following commands, Afib with RVR, extubated, oncology consulted  SUBJECTIVE: Extubated, discussed treatment options with oncology  VITAL SIGNS: Temp:  [96.7 F (35.9 C)-97.8 F (36.6 C)] 97.5 F (36.4 C) (05/06 0749) Pulse Rate:  [47-70] 69 (05/06 0900) Resp:  [8-21] 18 (05/06 0900) BP: (96-167)/(42-125) 139/55 mmHg (05/06 0900) SpO2:  [92 %-100 %] 97 % (05/06 0900) Arterial Line BP: (109-172)/(36-61) 160/57 mmHg (05/06 0900) FiO2 (%):  [40 %] 40 % (05/05 1128) HEMODYNAMICS:    VENTILATOR SETTINGS: Vent Mode:  [-] PSV;CPAP FiO2 (%):  [40 %] 40 % PEEP:  [5 cmH20] 5 cmH20 Pressure Support:  [5 cmH20] 5 cmH20 INTAKE / OUTPUT:  Intake/Output Summary (Last 24 hours) at 12/22/14 0912 Last data filed at 12/22/14 0900  Gross per 24 hour  Intake 1389.27 ml  Output   1975 ml  Net -585.73 ml    PHYSICAL EXAMINATION: Gen: awake comfortable in bed HENT: NCAT Eyes: EOMi PULM: CTA B CV:RRR, no mgr GI: BS infrequent, ileostomy Derm: surgical dressing C/D/I MSK:R foot> cyanotic sole and ventral aspect, cool to touch  Neuro: Awake, alert, interactive, maew  LABS:  CBC  Recent Labs Lab 12/20/14 0500 12/21/14 0430 12/22/14 0400  WBC 15.7* 14.0* 8.9  HGB 8.3* 6.2* 9.1*  HCT 25.4* 18.7* 27.6*  PLT 106* 105* 92*   Coag's  Recent Labs Lab 12/16/14 1632  INR 1.19   BMET  Recent Labs Lab 12/20/14 0500 12/21/14 0430 12/22/14 0400  NA 133* 140 140  K 4.0 3.7 3.2*  CL 103 103 102  CO2 20* 26 28  BUN 38* 40* 45*  CREATININE 2.69* 2.74* 2.74*  GLUCOSE 157* 136* 118*   Electrolytes  Recent Labs Lab 12/16/14 1632  12/20/14 0500 12/21/14 0430 12/22/14 0400  CALCIUM  --   < > 7.1* 7.3* 7.3*  MG 1.8  --   --   --   --   < > = values in this interval not displayed. Sepsis Markers  Recent Labs Lab 12/17/14 1511 12/17/14 1912 12/17/14 2210  LATICACIDVEN 4.6* 3.8* 2.6*   ABG  Recent Labs Lab 12/17/14 0841 12/17/14 1317 12/18/14 1143  PHART 7.321* 7.298* 7.240*  PCO2ART 37.3 35.4 36.7  PO2ART 162.0* 138.0* 156.0*   Liver Enzymes  Recent Labs Lab 12/16/14 1004  AST 26  ALT 14  ALKPHOS 89  BILITOT 1.1  ALBUMIN 2.7*   Cardiac Enzymes  Recent Labs Lab 12/18/14 1038  TROPONINI <0.03   Glucose  Recent Labs Lab 12/21/14 1220 12/21/14 1611 12/21/14 1938 12/21/14 2335 12/22/14 0353 12/22/14 0752  GLUCAP 106* 115* 114* 109* 113* 117*    Imaging CXR none new on 5/5  ASSESSMENT / PLAN:  PULMONARY OETT  5/1>>>5/5 Acute respiratory failure - resolved Pulm edema resolved P:   Aspiration precautions Monitor O2 saturation  CARDIOVASCULAR Septic shock> resolved 5/5 HTN > worse Afib with RVR > resolved 5/5 Acute ischemia to R foot >worsening exam, no improvement 5/6 P:  Stop hydrocortisone  Start amlodipine Hydralazine for SBP > 160 Vascular surgery seeing again, appreciate their recs ABI ordered Continue heparin gtt, aspirin tele  RENAL AKI > UOP adequate, Cr plateau 2.7? 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, mesentery, lymph nodes > per oncology may be responsive to immunotherapy S/p partial L colectomy with ileostomy  Metastatic colon cancer P:   Surgery following  Ice chips today, maybe food tomorrow per surgery Wound care  D/c PPI   HEMATOLOGIC Hgb 6.2 5/5, no sign of bleeding, improved 5/6 post 1 U PRBC P:  Monitor for bleeding while on heparin gtt  CBC in am  INFECTIOUS Peritonitis causing septic shock > improved P:   BCx2 5/1>>> UC 5/1>>>  Zosyn 5/1>>> Plan 7 days, re-dosed 5/6 Fluconazole 5/2 >> plan 5 days  ENDOCRINE History of DM2 per notes  P:   Change to POC SSI q4h from ICU scale  NEUROLOGIC R foot pain P:   Fentanyl prn  FAMILY  - Updates:  Updated bedside daily this week including 5/6 AM, see lengthy conversation documented in oncology note 5/5  Code status DNR  Plan transfer from ICU 5/6, to floor, [place] service, PCCM off  Roselie Awkward, MD [place] P[facility] Pager: [phone] Cell: [phone] If no response, call [phone]

## 2014-12-23 DIAGNOSIS — I8290 Acute embolism and thrombosis of unspecified vein: Secondary | ICD-10-CM

## 2014-12-23 DIAGNOSIS — K921 Melena: Secondary | ICD-10-CM

## 2014-12-23 DIAGNOSIS — D62 Acute posthemorrhagic anemia: Secondary | ICD-10-CM

## 2014-12-23 DIAGNOSIS — Z9114 Patient's other noncompliance with medication regimen: Secondary | ICD-10-CM

## 2014-12-23 DIAGNOSIS — K659 Peritonitis, unspecified: Secondary | ICD-10-CM

## 2014-12-23 LAB — GLUCOSE, CAPILLARY
GLUCOSE-CAPILLARY: 76 mg/dL (ref 70–99)
GLUCOSE-CAPILLARY: 78 mg/dL (ref 70–99)
GLUCOSE-CAPILLARY: 96 mg/dL (ref 70–99)
Glucose-Capillary: 58 mg/dL — ABNORMAL LOW (ref 70–99)
Glucose-Capillary: 73 mg/dL (ref 70–99)
Glucose-Capillary: 77 mg/dL (ref 70–99)

## 2014-12-23 LAB — BASIC METABOLIC PANEL WITH GFR
Anion gap: 12 (ref 5–15)
BUN: 50 mg/dL — ABNORMAL HIGH (ref 6–20)
CO2: 26 mmol/L (ref 22–32)
Calcium: 7.3 mg/dL — ABNORMAL LOW (ref 8.9–10.3)
Chloride: 102 mmol/L (ref 101–111)
Creatinine, Ser: 2.57 mg/dL — ABNORMAL HIGH (ref 0.44–1.00)
GFR calc Af Amer: 20 mL/min — ABNORMAL LOW
GFR calc non Af Amer: 17 mL/min — ABNORMAL LOW
Glucose, Bld: 65 mg/dL — ABNORMAL LOW (ref 70–99)
Potassium: 4.2 mmol/L (ref 3.5–5.1)
Sodium: 140 mmol/L (ref 135–145)

## 2014-12-23 LAB — CULTURE, BLOOD (ROUTINE X 2)
Culture: NO GROWTH
Culture: NO GROWTH

## 2014-12-23 LAB — CBC
HCT: 31.9 % — ABNORMAL LOW (ref 36.0–46.0)
Hemoglobin: 10.5 g/dL — ABNORMAL LOW (ref 12.0–15.0)
MCH: 25.9 pg — ABNORMAL LOW (ref 26.0–34.0)
MCHC: 32.9 g/dL (ref 30.0–36.0)
MCV: 78.6 fL (ref 78.0–100.0)
Platelets: 143 10*3/uL — ABNORMAL LOW (ref 150–400)
RBC: 4.06 MIL/uL (ref 3.87–5.11)
RDW: 16.7 % — ABNORMAL HIGH (ref 11.5–15.5)
WBC: 13.5 10*3/uL — ABNORMAL HIGH (ref 4.0–10.5)

## 2014-12-23 LAB — HEPARIN LEVEL (UNFRACTIONATED): Heparin Unfractionated: 0.52 [IU]/mL (ref 0.30–0.70)

## 2014-12-23 MED ORDER — DEXTROSE-NACL 5-0.9 % IV SOLN
INTRAVENOUS | Status: DC
  Administered 2014-12-23 – 2014-12-26 (×5): via INTRAVENOUS
  Administered 2014-12-27: 1000 mL via INTRAVENOUS
  Administered 2014-12-28: 03:00:00 via INTRAVENOUS

## 2014-12-23 MED ORDER — DEXTROSE 50 % IV SOLN
25.0000 mL | Freq: Once | INTRAVENOUS | Status: AC
  Administered 2014-12-23: 25 mL via INTRAVENOUS

## 2014-12-23 MED ORDER — FENTANYL CITRATE (PF) 100 MCG/2ML IJ SOLN
50.0000 ug | Freq: Once | INTRAMUSCULAR | Status: AC
  Administered 2014-12-23: 50 ug via INTRAVENOUS

## 2014-12-23 NOTE — Evaluation (Signed)
Clinical/Bedside Swallow Evaluation Patient Details  Name: Kathryn Knapp MRN: [id number] Date of Birth: 12-Jun-1941  Today's Date: 12/23/2014 Time: SLP Start Time (ACUTE ONLY): 1100 SLP Stop Time (ACUTE ONLY): 1120 SLP Time Calculation (min) (ACUTE ONLY): 20 min  Past Medical History:  Past Medical History  Diagnosis Date  . Diabetes mellitus without complication   . Hypertension    Past Surgical History:  Past Surgical History  Procedure Laterality Date  . Cholecystectomy    . Abdominal hysterectomy    . Laparotomy N/A 12/17/2014    Procedure: EXPLORATORY LAPAROTOMY Right, Transverse, partial left colectomy with en bloc partial gastrectomy illeostomy;  Surgeon: Georganna Skeans, MD;  Location: [facility];  Service: General;  Laterality: N/A;   HPI:  74 yo female with hx HTN, DM presented initially 4/30 with abd pain, n/v and weight loss. 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  Swallow eval ordered 5/6. Surgery allowing advanced diet per notes 5/7 - will proceed with swallow eval.     Assessment / Plan / Recommendation Clinical Impression  Pt presents with no clinical s/s of dysphagia - she is still battling nausea, which limited testing.  However, she demonstrates strong phonation and cough, indicative of adequate glottal closure. There are no CN deficits. Presents with sufficient mastication of soft solids; swift swallow response; no overt s/s of aspiration with any tested consistencies.  Recommend advancing diet as pt is able, given nausea.  Meds should be given whole in puree due to hx of difficulty swallowing pills in water.  D/W RN - no SLP f/u is warranted.  Will sign off.     Aspiration Risk  mild   Diet Recommendation  (advance to solids as able)   Medication  Administration: Whole meds with puree    Other  Recommendations Oral Care Recommendations: Oral care BID     Swallow Study Prior Functional Status       General Date of Onset: 12/16/14 Other Pertinent Information: 74 yo female with hx HTN, DM presented initially 4/30 with abd pain, n/v and weight loss. 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  Swallow eval ordered 5/6. Surgery allowing advanced diet per notes 5/7 - will proceed with swallow eval.   Type of Study: Bedside swallow evaluation Previous Swallow Assessment: none per records Diet Prior to this Study: NPO Temperature Spikes Noted: No Respiratory Status: Room air History of Recent Intubation: Yes Length of Intubations (days): 4 days Date extubated: 12/21/14 Behavior/Cognition: Alert;Cooperative;Pleasant mood Oral Cavity - Dentition: Edentulous Self-Feeding Abilities: Able to feed self Patient Positioning: Upright in bed Baseline Vocal Quality: Normal Volitional Cough: Strong Volitional Swallow: Able to elicit    Oral/Motor/Sensory Function Overall Oral Motor/Sensory Function: Appears within functional limits for tasks assessed   Ice Chips Ice chips: Within functional limits   Thin Liquid Thin Liquid: Within functional limits Presentation: Cup;Straw    Nectar Thick Nectar Thick Liquid: Not tested   Honey Thick Honey Thick Liquid: Not tested   Puree Puree: Within functional limits Presentation: Spoon;Self Fed   Solid  Neeko Pharo L. [place], [place] CCC/SLP Pager [phone]     Solid: Not tested (due to nausea)  Juan Quam Laurice 12/23/2014,11:32 AM

## 2014-12-23 NOTE — Progress Notes (Signed)
[facility] TEAM 1 - Stepdown/ICU TEAM Progress Note  Kathryn Knapp ZWC:[id number] DOB: Jun 29, 1941 DOA: 12/16/2014 PCP: No PCP Per Patient  Admit HPI / Brief Narrative: Patient is a 74 year old WF PMHx hypertension, Diabetes type 2, noncompliance medication Presents with a two-week history of lower abdominal pain. Patient states that the pain is severe, sharp and knife like in quality, and nonradiating. She denies any associated symptoms over the last 2 weeks. She denies any associated nausea, vomiting, or diarrhea. She states that it is a constant pain that does not come and go. She has noted possibly weight loss over the last 3-4 weeks. She also notes shortness of breath with exertion over the last week preventing her from being able to walk to the bus stop with her grandchildren. She denies any associated chest pain. Today, she did note nausea and vomiting although this was after the administration of pain medications. She also noted some diarrhea today as well but denied having any blood in her stool today or before that. She denies feeling any new masses or enlarged lymph nodes throughout her body. She states that she does not take any medications at home. Remotely, she has taken lisinopril for blood pressure and a diabetes medication as well but she had discontinued this as she has not seen a primary care provider in a long time. Otherwise, patient denies any fevers, night sweats, chills, dysuria, hematuria, or constipation. CT abdomen revealed large bowel obstruction and colonic mass. She was prepped for colonoscopy for decompression prior to elective surgery. However a CT chest to look for metastasis revealed large amount free air and pt was taken urgently to OR.  HPI/Subjective: 5/7 A/O 4, states when her son died 63 years ago just stopped taking her BP medication and diabetic medication. Had not seen a physician after his death concerning her health. Currently negative abdominal pain,  negative CP, negative SOB. Positive right lower extremity pain.  Assessment/Plan: Acute respiratory failure  - resolved  Septic shock -resolved 5/5  HTN  -controlled -Self D/Ced meds 15 yrs ago   Afib with RVR -Currrently NSR  Acute ischemia to R foot/Rt Foot pain  - Per surgery; Amputation when? 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 -Oncology will decide on treatment options if patient can survive her current multisystem organ failure -Wound care per surgery   Acute Blood Loss Anemia -Hgb 6.2 5/5, no sign of bleeding -improved 5/6 post 1 U PRBC  Melena Ostomy Bag  -Follow hemoglobin carefully. Transfuse for hemoglobin<7  DM Type 2 -Noncompliant for past 15 yrs w/ medication -Continue Sensitive SSI -Hemoglobin A1c pending -Lipid Panel pending    Code Status: FULL Family Communication: no family present at time of exam Disposition Plan: Per surgery    Consultants: Dr.Thomas Cornett (surgery)  Dr.Yan Burr Medico (oncology) Dr.Douglas B McQuaid [facility] M)    Procedure/Significant Events: CT chest 5/1>>> Large amount of free air and free fluid within  the upper abdomen, Small left pleural effusion, 2.5 cm left adrenal lesion again raises concern for metastasis CT abd 4/30>>>Mid colonic obstruction on the basis of an apple core  tumor at the distal transverse colon/splenic flexure, measuring up to 8.4 cm, as above. Associated mesenteric/retroperitoneal nodal metastases. 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 5/2 Echo> LVH, LVEF 55%, normal RV size and function, trace pericardial effusion 5/1 OR>> exp lap, Partial colectomy with ileostomy r/t perforated bowel  5/3 Cyanosis R foot, worsening renal failure, made DNR 5/5 Off pressors, UOP up, following commands, Afib with RVR, extubated, oncology consulted 5/6 right lower extremity atrial Doppler;-Proximal posterior tibial artery exhibits occlusion.  -Distal posterior tibial artery thombus.  -Unable to obtain flow in the dorsalis pedis artery. Unable to visualize the peroneal artery -5/6 transfusion 1 unit PRBC   Culture BCx2 5/1>>> UC 5/1>>>  Antibiotics: Zosyn 5/1>>> Plan 7 days, re-dosed 5/6 Fluconazole 5/2 >> plan 5 days   DVT prophylaxis: Heparin gtt   Devices    LINES / TUBES:      Continuous Infusions: . sodium chloride Stopped (12/23/14 1800)  . dextrose 5 % and 0.9% NaCl 75 mL/hr at 12/23/14 1800  . heparin 1,400 Units/hr (12/24/14 0430)    Objective: VITAL SIGNS: Temp: 97.5 F (36.4 C) (05/08 0400) Temp Source: Oral (05/08 0400) BP: 117/46 mmHg (05/08 0600) Pulse Rate: 62 (05/08 0600) SPO2; FIO2:   Intake/Output Summary (Last 24 hours) at 12/24/14 0757 Last data filed at 12/24/14 0700  Gross per 24 hour  Intake 2244.5 ml  Output   1270 ml  Net  974.5 ml     Exam: General: A/O 4, right lower extremity pain, No acute respiratory distress Lungs: Clear to auscultation bilaterally without wheezes or crackles Cardiovascular: Regular rate and rhythm without murmur gallop or rub normal S1 and S2 Abdomen: Appropriately tender to palpation, RLQ ostomy bag present with melena discharge, no UQ JP drain serous fluid tinged with small amount of blood, midline incisions covered and clean, positive soft, bowel sounds,no rebound, no ascites,  no appreciable mass Extremities: LLE within normal limit, RLE cold mottled painful to palpation negative DP/PT pulse   Data Reviewed: Basic Metabolic Panel:  Recent Labs Lab 12/20/14 0500 12/21/14 0430 12/22/14 0400 12/23/14 0300 12/24/14 0315  NA 133* 140 140 140 140  K 4.0 3.7 3.2* 4.2 2.8*  CL 103 103 102 102 102  CO2 20* 26 28 26 29   GLUCOSE 157* 136* 118* 65* 114*  BUN 38* 40* 45* 50* 42*  CREATININE 2.69* 2.74* 2.74* 2.57* 2.09*  CALCIUM 7.1* 7.3* 7.3* 7.3* 7.1*  MG  --   --   --   --  1.6*  PHOS  --   --   --   --  4.0   Liver Function Tests:  Recent Labs Lab 12/24/14 0315  AST 69*  ALT 45  ALKPHOS 89  BILITOT 0.6  PROT 4.2*  ALBUMIN 1.1*   No results for input(s): LIPASE, AMYLASE in the last 168 hours. No results for input(s): AMMONIA in the last 168 hours. CBC:  Recent Labs Lab 12/20/14 0500 12/21/14 0430 12/22/14 0400 12/23/14 0300 12/24/14 0315  WBC 15.7* 14.0* 8.9 13.5* 16.7*  NEUTROABS  --  12.2*  --   --  14.4*  HGB 8.3* 6.2* 9.1* 10.5* 8.8*  HCT 25.4* 18.7* 27.6* 31.9* 26.6*  MCV 76.7* 75.4* 78.6 78.6 78.5  PLT 106* 105* 92* 143* 227   Cardiac Enzymes:  Recent Labs Lab 12/18/14 1038  TROPONINI <  0.03   BNP (last 3 results) No results for input(s): BNP in the last 8760 hours.  ProBNP (last 3 results) No results for input(s): PROBNP in the last 8760 hours.  CBG:  Recent Labs Lab 12/23/14 1101 12/23/14 1619 12/23/14 2013 12/23/14 2355 12/24/14 0409  GLUCAP 76 78 96 108* 107*    Recent Results (from the past 240 hour(s))  Surgical pcr screen     Status: None   Collection Time: 12/17/14  6:20 AM  Result Value Ref Range Status   MRSA, PCR NEGATIVE NEGATIVE Final   Staphylococcus aureus NEGATIVE NEGATIVE Final    Comment:        The Xpert SA Assay (FDA approved for NASAL specimens in patients over 69 years of age), is one component of a comprehensive surveillance program.  Test performance has been validated by  [facility] for patients greater than or equal to 17 year old. It is not intended to diagnose infection nor to guide or monitor treatment.   Surgical pcr screen     Status: None   Collection Time: 12/17/14 11:45 AM  Result Value Ref Range Status   MRSA, PCR NEGATIVE NEGATIVE Final   Staphylococcus aureus NEGATIVE NEGATIVE Final    Comment:        The Xpert SA Assay (FDA approved for NASAL specimens in patients over 27 years of age), is one component of a comprehensive surveillance program.  Test performance has been validated by [facility] for patients greater than or equal to 40 year old. It is not intended to diagnose infection nor to guide or monitor treatment.   Culture, blood (routine x 2)     Status: None   Collection Time: 12/17/14  4:25 PM  Result Value Ref Range Status   Specimen Description BLOOD LEFT WRIST  Final   Special Requests BOTTLES DRAWN AEROBIC ONLY 1CC  Final   Culture   Final    NO GROWTH 5 DAYS Note: Culture results may be compromised due to an inadequate volume of blood received in culture bottles. Performed at [facility]    Report Status 12/23/2014 FINAL  Final  Culture, blood (routine x 2)     Status: None   Collection Time: 12/17/14  4:28 PM  Result Value Ref Range Status   Specimen Description BLOOD CENTRAL LINE  Final   Special Requests BOTTLES DRAWN AEROBIC ONLY 10CC  Final   Culture   Final    NO GROWTH 5 DAYS Performed at [facility]    Report Status 12/23/2014 FINAL  Final     Studies:  Recent x-ray studies have been reviewed in detail by the Attending Physician  Scheduled Meds:  Scheduled Meds: . amLODipine  5 mg Oral Daily  . antiseptic oral rinse  7 mL Mouth Rinse q12n4p  . aspirin  81 mg Oral Daily  . chlorhexidine  15 mL Mouth Rinse BID  . insulin aspart  0-9 Units Subcutaneous 6 times per day    Time spent on care of this patient: 40 mins   Kathryn Knapp, Geraldo Docker , MD  [facility] Office   [phone] Pager - [phone]  On-Call/Text Page:      [email or web address]      password [email or web address]  If 7PM-7AM, please contact night-coverage [email or web address] Password [email or web address] 12/24/2014, 7:57 AM   LOS: 8 days   Care during the described time interval was provided by me .  I have reviewed this patient's available data, including medical history, events of note,  physical examination, radiology studies and test results as part of my evaluation  Dia Crawford, MD [phone] Pager

## 2014-12-23 NOTE — Progress Notes (Addendum)
Dr. Jimmy Footman notified of bright red stool from ostomy. Awaiting CBC results. Heparin will remain at 1300u/hr until further orders.

## 2014-12-23 NOTE — Progress Notes (Signed)
6 Days Post-Op  Subjective: PT FEELS WEAK no vomiting Ostomy output noted   Objective: Vital signs in last 24 hours: Temp:  [97.3 F (36.3 C)-97.7 F (36.5 C)] 97.3 F (36.3 C) (05/07 0700) Pulse Rate:  [50-74] 74 (05/07 0900) Resp:  [12-24] 24 (05/07 0900) BP: (101-141)/(40-109) 107/46 mmHg (05/07 0900) SpO2:  [93 %-100 %] 94 % (05/07 0900) Arterial Line BP: (149-157)/(47-60) 154/48 mmHg (05/06 1200) Last BM Date: 01/15/15  Intake/Output from previous day: 05/06 0701 - 05/07 0700 In: 1202 [I.V.:752; IV Piggyback:400] Out: 1515 [Urine:1415; Stool:100] Intake/Output this shift: Total I/O In: 46 [I.V.:46] Out: 135 [Urine:125; Drains:10]  Incision/Wound:open clean ostomy viable and dark succus output  Lab Results:   Recent Labs  12/22/14 0400 12/23/14 0300  WBC 8.9 13.5*  HGB 9.1* 10.5*  HCT 27.6* 31.9*  PLT 92* 143*   BMET  Recent Labs  12/22/14 0400 12/23/14 0300  NA 140 140  K 3.2* 4.2  CL 102 102  CO2 28 26  GLUCOSE 118* 65*  BUN 45* 50*  CREATININE 2.74* 2.57*  CALCIUM 7.3* 7.3*   PT/INR No results for input(s): LABPROT, INR in the last 72 hours. ABG No results for input(s): PHART, HCO3 in the last 72 hours.  Invalid input(s): PCO2, PO2  Studies/Results: No results found.  Anti-infectives: Anti-infectives    Start     Dose/Rate Route Frequency Ordered Stop   12/22/14 1000  piperacillin-tazobactam (ZOSYN) IVPB 2.25 g     2.25 g 100 mL/hr over 30 Minutes Intravenous Every 6 hours 12/22/14 0949 12/24/14 0359   12/19/14 1600  fluconazole (DIFLUCAN) IVPB 200 mg     200 mg 100 mL/hr over 60 Minutes Intravenous Every 24 hours 12/19/14 0901 12/24/14 1559   12/18/14 1100  fluconazole (DIFLUCAN) IVPB 400 mg  Status:  Discontinued     400 mg 100 mL/hr over 120 Minutes Intravenous Every 24 hours 12/18/14 1036 12/19/14 0901   12/17/14 1100  piperacillin-tazobactam (ZOSYN) IVPB 3.375 g  Status:  Discontinued     3.375 g 12.5 mL/hr over 240 Minutes  Intravenous Every 8 hours 12/17/14 0956 12/22/14 0911   12/17/14 0600  cefoTEtan (CEFOTAN) 2 g in dextrose 5 % 50 mL IVPB     2 g 100 mL/hr over 30 Minutes Intravenous To Surgery 12/17/14 0542 12/17/14 0726      Assessment/Plan: s/p Procedure(s): EXPLORATORY LAPAROTOMY Right, Transverse, partial left colectomy with en bloc partial gastrectomy illeostomy (N/A) Advance diet Keep foley for elevated Cr and strict I/O OOB PT/OT  LOS: 7 days    Kathryn Knapp A. 12/23/2014

## 2014-12-23 NOTE — Progress Notes (Signed)
    Subjective  -   Awake and alert this am C/o right leg painm   Physical Exam:  Right leg without the ability to move her toes  No sensation to right foot which is cold and has early mottling       Assessment/Plan:  Ischemic right leg   Doppler studies reviewed.  No flow to right foot Discussed with patient that she will need a right below-knee versus above-knee amputation.  As long as she remains stable, I would give this some time to demarcate.  Dr. Bridgett Larsson will evaluate on Monday.  Please contact up if she deteriorates clinically over the weekend and amputation can be performed sooner.  Kathryn Knapp 12/23/2014 10:40 AM --  Kathryn Knapp Vitals:   12/23/14 1000  BP: 105/47  Pulse:   Temp:   Resp: 14    Intake/Output Summary (Last 24 hours) at 12/23/14 1040 Last data filed at 12/23/14 1000  Gross per 24 hour  Intake   1072 ml  Output   1510 ml  Net   -438 ml     Laboratory CBC    Component Value Date/Time   WBC 13.5* 12/23/2014 0300   HGB 10.5* 12/23/2014 0300   HCT 31.9* 12/23/2014 0300   PLT 143* 12/23/2014 0300    BMET    Component Value Date/Time   NA 140 12/23/2014 0300   K 4.2 12/23/2014 0300   CL 102 12/23/2014 0300   CO2 26 12/23/2014 0300   GLUCOSE 65* 12/23/2014 0300   BUN 50* 12/23/2014 0300   CREATININE 2.57* 12/23/2014 0300   CALCIUM 7.3* 12/23/2014 0300   GFRNONAA 17* 12/23/2014 0300   GFRAA 20* 12/23/2014 0300    COAG Lab Results  Component Value Date   INR 1.19 12/16/2014   No results found for: PTT  Antibiotics Anti-infectives    Start     Dose/Rate Route Frequency Ordered Stop   12/22/14 1000  piperacillin-tazobactam (ZOSYN) IVPB 2.25 g     2.25 g 100 mL/hr over 30 Minutes Intravenous Every 6 hours 12/22/14 0949 12/24/14 0359   12/19/14 1600  fluconazole (DIFLUCAN) IVPB 200 mg     200 mg 100 mL/hr over 60 Minutes Intravenous Every 24 hours 12/19/14 0901 12/24/14 1559   12/18/14 1100  fluconazole (DIFLUCAN) IVPB 400 mg   Status:  Discontinued     400 mg 100 mL/hr over 120 Minutes Intravenous Every 24 hours 12/18/14 1036 12/19/14 0901   12/17/14 1100  piperacillin-tazobactam (ZOSYN) IVPB 3.375 g  Status:  Discontinued     3.375 g 12.5 mL/hr over 240 Minutes Intravenous Every 8 hours 12/17/14 0956 12/22/14 0911   12/17/14 0600  cefoTEtan (CEFOTAN) 2 g in dextrose 5 % 50 mL IVPB     2 g 100 mL/hr over 30 Minutes Intravenous To Surgery 12/17/14 0542 12/17/14 0726       V. Leia Alf, M.D. [facility] Office: [phone] Pager:  [phone]

## 2014-12-23 NOTE — Consult Note (Signed)
ANTICOAGULATION CONSULT NOTE -follow up Pharmacy Consult for Heparin Indication: RLE ischemia  No Known Allergies  Patient Measurements: Height: 5\' 9"  (175.3 cm) Weight: 187 lb 9.8 oz (85.1 kg) IBW/kg (Calculated) : 66.2 Heparin Dosing Weight: ~83kg  Vital Signs: Temp: 97.3 F (36.3 C) (05/07 0700) Temp Source: Oral (05/07 0700) BP: 121/61 mmHg (05/07 1100) Pulse Rate: 78 (05/07 1100)  Labs:  Recent Labs  12/21/14 0430 12/21/14 2330 12/22/14 0400 12/23/14 0300 12/23/14 0830  HGB 6.2*  --  9.1* 10.5*  --   HCT 18.7*  --  27.6* 31.9*  --   PLT 105*  --  92* 143*  --   HEPARINUNFRC  --  0.50 0.58  --  0.52  CREATININE 2.74*  --  2.74* 2.57*  --     Estimated Creatinine Clearance: 22.7 mL/min (by C-G formula based on Cr of 2.57).   Medical History: Past Medical History  Diagnosis Date  . Diabetes mellitus without complication   . Hypertension     Assessment: Initial 8 h heparin level = 0.5 on IV heparin drip at 1300 units/hr for RLE ischemia in this  74yo female. Noted that her platelets have been decreasing over the last several days but appear to be stabilizing yesterday. Her Hgb also dropped to 6.2 and she received 1 unit PRBC. Will watch her CBC closely.   Confirmatory HL remains therapeutic at 0.58 on heparin 1300 units/hr. No bleeding noted.  This morning's HL remains therapeutic at 0.52 on heparin 1300 units/hr. Bright red stool in ostomy bag and MD aware. Hgb and Plt improved from yesterday.  Goal of Therapy:  Heparin level 0.3-0.7 units/ml Monitor platelets by anticoagulation protocol: Yes   Plan:  Continue heparin 1300 units/hr Daily heparin level and CBC Monitor s/sx of bleeding  Andrey Cota. Diona Foley, PharmD Clinical Pharmacist Pager [phone] 12/23/2014,11:28 AM

## 2014-12-24 DIAGNOSIS — C189 Malignant neoplasm of colon, unspecified: Secondary | ICD-10-CM | POA: Diagnosis present

## 2014-12-24 DIAGNOSIS — D62 Acute posthemorrhagic anemia: Secondary | ICD-10-CM | POA: Diagnosis present

## 2014-12-24 DIAGNOSIS — F329 Major depressive disorder, single episode, unspecified: Secondary | ICD-10-CM

## 2014-12-24 DIAGNOSIS — N179 Acute kidney failure, unspecified: Secondary | ICD-10-CM | POA: Diagnosis present

## 2014-12-24 DIAGNOSIS — J9601 Acute respiratory failure with hypoxia: Secondary | ICD-10-CM | POA: Diagnosis present

## 2014-12-24 DIAGNOSIS — I8290 Acute embolism and thrombosis of unspecified vein: Secondary | ICD-10-CM | POA: Diagnosis present

## 2014-12-24 DIAGNOSIS — K921 Melena: Secondary | ICD-10-CM | POA: Diagnosis present

## 2014-12-24 DIAGNOSIS — K659 Peritonitis, unspecified: Secondary | ICD-10-CM | POA: Diagnosis present

## 2014-12-24 DIAGNOSIS — E876 Hypokalemia: Secondary | ICD-10-CM | POA: Diagnosis present

## 2014-12-24 DIAGNOSIS — F32A Depression, unspecified: Secondary | ICD-10-CM | POA: Diagnosis present

## 2014-12-24 LAB — COMPREHENSIVE METABOLIC PANEL
ALBUMIN: 1.1 g/dL — AB (ref 3.5–5.0)
ALK PHOS: 89 U/L (ref 38–126)
ALT: 45 U/L (ref 14–54)
ANION GAP: 9 (ref 5–15)
AST: 69 U/L — ABNORMAL HIGH (ref 15–41)
BILIRUBIN TOTAL: 0.6 mg/dL (ref 0.3–1.2)
BUN: 42 mg/dL — ABNORMAL HIGH (ref 6–20)
CO2: 29 mmol/L (ref 22–32)
Calcium: 7.1 mg/dL — ABNORMAL LOW (ref 8.9–10.3)
Chloride: 102 mmol/L (ref 101–111)
Creatinine, Ser: 2.09 mg/dL — ABNORMAL HIGH (ref 0.44–1.00)
GFR calc Af Amer: 26 mL/min — ABNORMAL LOW (ref >60)
GFR calc non Af Amer: 22 mL/min — ABNORMAL LOW (ref >60)
Glucose, Bld: 114 mg/dL — ABNORMAL HIGH (ref 70–99)
POTASSIUM: 2.8 mmol/L — AB (ref 3.5–5.1)
SODIUM: 140 mmol/L (ref 135–145)
TOTAL PROTEIN: 4.2 g/dL — AB (ref 6.5–8.1)

## 2014-12-24 LAB — GLUCOSE, CAPILLARY
GLUCOSE-CAPILLARY: 107 mg/dL — AB (ref 70–99)
GLUCOSE-CAPILLARY: 120 mg/dL — AB (ref 70–99)
GLUCOSE-CAPILLARY: 120 mg/dL — AB (ref 70–99)
Glucose-Capillary: 104 mg/dL — ABNORMAL HIGH (ref 70–99)
Glucose-Capillary: 108 mg/dL — ABNORMAL HIGH (ref 70–99)
Glucose-Capillary: 123 mg/dL — ABNORMAL HIGH (ref 70–99)
Glucose-Capillary: 125 mg/dL — ABNORMAL HIGH (ref 70–99)

## 2014-12-24 LAB — LIPID PANEL
CHOL/HDL RATIO: 7.8 ratio
Cholesterol: 109 mg/dL (ref 0–200)
HDL: 14 mg/dL — ABNORMAL LOW (ref >40)
LDL Cholesterol: 64 mg/dL (ref 0–99)
TRIGLYCERIDES: 156 mg/dL — AB (ref <150)
VLDL: 31 mg/dL (ref 0–40)

## 2014-12-24 LAB — CBC WITH DIFFERENTIAL/PLATELET
BASOS ABS: 0 10*3/uL (ref 0.0–0.1)
Basophils Relative: 0 % (ref 0–1)
EOS ABS: 0.2 10*3/uL (ref 0.0–0.7)
Eosinophils Relative: 1 % (ref 0–5)
HEMATOCRIT: 26.6 % — AB (ref 36.0–46.0)
HEMOGLOBIN: 8.8 g/dL — AB (ref 12.0–15.0)
LYMPHS ABS: 1.3 10*3/uL (ref 0.7–4.0)
LYMPHS PCT: 8 % — AB (ref 12–46)
MCH: 26 pg (ref 26.0–34.0)
MCHC: 33.1 g/dL (ref 30.0–36.0)
MCV: 78.5 fL (ref 78.0–100.0)
MONOS PCT: 5 % (ref 3–12)
Monocytes Absolute: 0.8 10*3/uL (ref 0.1–1.0)
NEUTROS ABS: 14.4 10*3/uL — AB (ref 1.7–7.7)
Neutrophils Relative %: 86 % — ABNORMAL HIGH (ref 43–77)
Platelets: 227 10*3/uL (ref 150–400)
RBC: 3.39 MIL/uL — ABNORMAL LOW (ref 3.87–5.11)
RDW: 16.7 % — ABNORMAL HIGH (ref 11.5–15.5)
WBC: 16.7 10*3/uL — ABNORMAL HIGH (ref 4.0–10.5)

## 2014-12-24 LAB — HEPARIN LEVEL (UNFRACTIONATED)
HEPARIN UNFRACTIONATED: 0.47 [IU]/mL (ref 0.30–0.70)
Heparin Unfractionated: 0.26 IU/mL — ABNORMAL LOW (ref 0.30–0.70)
Heparin Unfractionated: 0.33 IU/mL (ref 0.30–0.70)

## 2014-12-24 LAB — POTASSIUM: Potassium: 3.2 mmol/L — ABNORMAL LOW (ref 3.5–5.1)

## 2014-12-24 LAB — MAGNESIUM
MAGNESIUM: 1.6 mg/dL — AB (ref 1.7–2.4)
MAGNESIUM: 2.2 mg/dL (ref 1.7–2.4)

## 2014-12-24 LAB — PHOSPHORUS: Phosphorus: 4 mg/dL (ref 2.5–4.6)

## 2014-12-24 MED ORDER — POTASSIUM CHLORIDE 10 MEQ/100ML IV SOLN
10.0000 meq | INTRAVENOUS | Status: AC
  Administered 2014-12-24 (×5): 10 meq via INTRAVENOUS
  Filled 2014-12-24 (×5): qty 100

## 2014-12-24 MED ORDER — MAGNESIUM SULFATE 50 % IJ SOLN
3.0000 g | Freq: Once | INTRAMUSCULAR | Status: AC
  Administered 2014-12-24: 3 g via INTRAVENOUS
  Filled 2014-12-24: qty 6

## 2014-12-24 MED ORDER — FLUOXETINE HCL 20 MG/5ML PO SOLN
20.0000 mg | Freq: Every day | ORAL | Status: DC
  Administered 2014-12-24 – 2015-01-01 (×7): 20 mg via ORAL
  Filled 2014-12-24 (×9): qty 5

## 2014-12-24 NOTE — Progress Notes (Signed)
7 Days Post-Op  Subjective: Feels better  Objective: Vital signs in last 24 hours: Temp:  [97.4 F (36.3 C)-98.2 F (36.8 C)] 97.5 F (36.4 C) (05/08 0400) Pulse Rate:  [50-78] 51 (05/08 0800) Resp:  [13-29] 26 (05/08 0800) BP: (105-122)/(43-61) 115/45 mmHg (05/08 0800) SpO2:  [91 %-100 %] 98 % (05/08 0800) Last BM Date: 01/15/15  Intake/Output from previous day: 05/07 0701 - 05/08 0700 In: 2244.5 [P.O.:650; I.V.:1394.5; IV Piggyback:200] Out: 1270 [Urine:1050; Drains:20; Stool:200] Intake/Output this shift: Total I/O In: 89 [I.V.:89] Out: 80 [Urine:80]  Incision/Wound:open clean not  much granulation tissue ostomy pink functioning  Lab Results:   Recent Labs  12/23/14 0300 12/24/14 0315  WBC 13.5* 16.7*  HGB 10.5* 8.8*  HCT 31.9* 26.6*  PLT 143* 227   BMET  Recent Labs  12/23/14 0300 12/24/14 0315  NA 140 140  K 4.2 2.8*  CL 102 102  CO2 26 29  GLUCOSE 65* 114*  BUN 50* 42*  CREATININE 2.57* 2.09*  CALCIUM 7.3* 7.1*   PT/INR No results for input(s): LABPROT, INR in the last 72 hours. ABG No results for input(s): PHART, HCO3 in the last 72 hours.  Invalid input(s): PCO2, PO2  Studies/Results: No results found.  Anti-infectives: Anti-infectives    Start     Dose/Rate Route Frequency Ordered Stop   12/22/14 1000  piperacillin-tazobactam (ZOSYN) IVPB 2.25 g     2.25 g 100 mL/hr over 30 Minutes Intravenous Every 6 hours 12/22/14 0949 12/23/14 2230   12/19/14 1600  fluconazole (DIFLUCAN) IVPB 200 mg     200 mg 100 mL/hr over 60 Minutes Intravenous Every 24 hours 12/19/14 0901 12/23/14 1600   12/18/14 1100  fluconazole (DIFLUCAN) IVPB 400 mg  Status:  Discontinued     400 mg 100 mL/hr over 120 Minutes Intravenous Every 24 hours 12/18/14 1036 12/19/14 0901   12/17/14 1100  piperacillin-tazobactam (ZOSYN) IVPB 3.375 g  Status:  Discontinued     3.375 g 12.5 mL/hr over 240 Minutes Intravenous Every 8 hours 12/17/14 0956 12/22/14 0911   12/17/14  0600  cefoTEtan (CEFOTAN) 2 g in dextrose 5 % 50 mL IVPB     2 g 100 mL/hr over 30 Minutes Intravenous To Surgery 12/17/14 0542 12/17/14 0726      Assessment/Plan: s/p Procedure(s): EXPLORATORY LAPAROTOMY Right, Transverse, partial left colectomy with en bloc partial gastrectomy illeostomy (N/A) Stable  Will follow  Ok to advance diet as tolerated   LOS: 8 days    Kathryn Knapp A. 12/24/2014

## 2014-12-24 NOTE — Consult Note (Signed)
ANTICOAGULATION CONSULT NOTE -follow up Pharmacy Consult for Heparin Indication: RLE ischemia  No Known Allergies  Patient Measurements: Height: 5\' 9"  (175.3 cm) Weight: 187 lb 9.8 oz (85.1 kg) IBW/kg (Calculated) : 66.2 Heparin Dosing Weight: ~83kg  Vital Signs: Temp: 98.4 F (36.9 C) (05/08 1100) Temp Source: Oral (05/08 1100) BP: 125/38 mmHg (05/08 1043) Pulse Rate: 51 (05/08 0800)  Labs:  Recent Labs  12/22/14 0400 12/23/14 0300 12/23/14 0830 12/24/14 0308 12/24/14 0315 12/24/14 1242  HGB 9.1* 10.5*  --   --  8.8*  --   HCT 27.6* 31.9*  --   --  26.6*  --   PLT 92* 143*  --   --  227  --   HEPARINUNFRC 0.58  --  0.52 0.26*  --  0.47  CREATININE 2.74* 2.57*  --   --  2.09*  --     Estimated Creatinine Clearance: 27.9 mL/min (by C-G formula based on Cr of 2.09).   Medical History: Past Medical History  Diagnosis Date  . Diabetes mellitus without complication   . Hypertension     Assessment: Initial 8 h heparin level = 0.5 on IV heparin drip at 1300 units/hr for RLE ischemia in this  74yo female. Noted that her platelets have been decreasing over the last several days but appear to be stabilizing yesterday. Her Hgb also dropped to 6.2 and she received 1 unit PRBC. Will watch her CBC closely.   Follow-up HL is therapeutic after dropping slightly SUBtherapeutic this morning. No issues with infusion or bleeding reported.  Goal of Therapy:  Heparin level 0.3-0.7 units/ml Monitor platelets by anticoagulation protocol: Yes   Plan:  Continue heparin 1400 units/hr Daily heparin level and CBC Monitor s/sx of bleeding  Andrey Cota. Diona Foley, PharmD Clinical Pharmacist Pager [phone] 12/24/2014,1:12 PM

## 2014-12-24 NOTE — Progress Notes (Signed)
ANTICOAGULATION CONSULT NOTE - Follow Up Consult  Pharmacy Consult for heparin Indication: RLE ischemia  Labs:  Recent Labs  12/21/14 0430  12/22/14 0400 12/23/14 0300 12/23/14 0830 12/24/14 0308 12/24/14 0315  HGB 6.2*  --  9.1* 10.5*  --   --  8.8*  HCT 18.7*  --  27.6* 31.9*  --   --  26.6*  PLT 105*  --  92* 143*  --   --  227  HEPARINUNFRC  --   < > 0.58  --  0.52 0.26*  --   CREATININE 2.74*  --  2.74* 2.57*  --   --   --   < > = values in this interval not displayed.   Assessment: 74yo female now subtherapeutic on heparin after several levels at goal, no gtt interruptions per RN; blood in ostomy bag is streaky and less severe than yesterday.  Goal of Therapy:  Heparin level 0.3-0.7 units/ml   Plan:  Will increase heparin gtt slightly to 1400 units/hr and check level in [place], PharmD, BCPS  12/24/2014,4:18 AM

## 2014-12-24 NOTE — Evaluation (Signed)
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 Developed ischemic Rt foot; currently awaiting amputation PMHx- DM, HTN    Clinical Impression  Pt admitted with above diagnosis. Pt currently non-ambulatory and being lifted to chair due to pain from ischemic Rt foot (awaiting demarcation followed by amputation). Pt currently with functional limitations due to the deficits listed below (see PT Problem List). Pt can currently benefit from PT to maximize her strength prior to surgery (in preparation for further PT after surgery).     Follow Up Recommendations CIR (after surgery)    Equipment Recommendations   (TBA after surgery)    Recommendations for Other Services Rehab consult;OT consult (after amputation)     Precautions / Restrictions Precautions Precautions: Fall      Mobility  Bed Mobility Overal bed mobility: Needs Assistance Bed Mobility: Supine to Sit     Supine to sit: Mod assist;HOB elevated     General bed mobility comments: pt "hooked arms" with PT to pull herself forward to long-sitting (from supported sitting in bed)  Transfers                    Ambulation/Gait                Stairs            Wheelchair Mobility    Modified Rankin (Stroke Patients Only)       Balance                                             Pertinent Vitals/Pain Pain Assessment: Faces Faces Pain Scale: Hurts even more Pain Location: Rt foot/lower leg  Pain Descriptors / Indicators: Burning Pain Intervention(s): Limited activity within patient's tolerance;Monitored during  session;Repositioned    Home Living Family/patient expects to be discharged to:: Private residence Living Arrangements: Children;Other relatives (dtr and grandaughter) Available Help at Discharge: Family;Available 24 hours/day (dtr currently unemployed) Type of Home: Mobile home Home Access: Stairs to enter Entrance Stairs-Rails: Right;Left;Can reach both Entrance Stairs-Number of Steps: 4 Home Layout: One level        Prior Function Level of Independence: Independent               Hand Dominance        Extremity/Trunk Assessment   Upper Extremity Assessment: Overall WFL for tasks assessed (biceps, triceps 4/5 bil)           Lower Extremity Assessment: RLE deficits/detail;LLE deficits/detail RLE Deficits / Details: pain Rt lower leg and foot due to ischemia/necrosis; unable to move toes or ankle; AAROM at knee to 90 (tested supine) LLE Deficits / Details: AAROM WFL; hip/knee extension 3+ to 4/5     Communication   Communication: No difficulties  Cognition Arousal/Alertness: Awake/alert Behavior During Therapy: WFL for tasks assessed/performed Overall Cognitive Status: Within Functional Limits for tasks assessed                      General Comments      Exercises General Exercises - Lower Extremity Ankle Circles/Pumps:  AROM;AAROM;Left;10 reps (emphasized heel cord stretch/ankle pumps every hour) Heel Slides: AAROM;Right;5 reps;Left;10 reps (LLE resisted extension)      Assessment/Plan    PT Assessment Patient needs continued PT services  PT Diagnosis Difficulty walking;Acute pain   PT Problem List Decreased strength;Decreased range of motion;Decreased activity tolerance;Decreased balance;Decreased mobility;Decreased knowledge of use of DME;Decreased knowledge of precautions;Impaired sensation;Pain  PT Treatment Interventions DME instruction;Functional mobility training;Therapeutic activities;Therapeutic exercise;Balance training;Neuromuscular  re-education;Patient/family education;Wheelchair mobility training   PT Goals (Current goals can be found in the Care Plan section) Acute Rehab PT Goals Patient Stated Goal: return home with family assist PT Goal Formulation: With patient Time For Goal Achievement: 01/01/15 Potential to Achieve Goals: Good    Frequency Min 3X/week   Barriers to discharge        Co-evaluation               End of Session   Activity Tolerance: Patient limited by pain Patient left: in bed;with call bell/phone within reach (bed in partial chair position(feet below knees, HOB 45))           Time: 2500-3704 PT Time Calculation (min) (ACUTE ONLY): 27 min   Charges:   PT Evaluation $Initial PT Evaluation Tier I: 1 Procedure PT Treatments $Therapeutic Exercise: 8-22 mins   PT G Codes:        Brooklen Runquist Jan 16, 2015, 3:08 PM Pager [phone]

## 2014-12-24 NOTE — Consult Note (Signed)
ANTICOAGULATION CONSULT NOTE -follow up Pharmacy Consult for Heparin Indication: RLE ischemia  No Known Allergies  Patient Measurements: Height: 5\' 9"  (175.3 cm) Weight: 187 lb 9.8 oz (85.1 kg) IBW/kg (Calculated) : 66.2 Heparin Dosing Weight: ~83kg  Vital Signs: Temp: 98.5 F (36.9 C) (05/08 2000) Temp Source: Axillary (05/08 2000) BP: 112/49 mmHg (05/08 2200) Pulse Rate: 64 (05/08 2200)  Labs:  Recent Labs  12/22/14 0400 12/23/14 0300  12/24/14 0308 12/24/14 0315 12/24/14 1242 12/24/14 2113  HGB 9.1* 10.5*  --   --  8.8*  --   --   HCT 27.6* 31.9*  --   --  26.6*  --   --   PLT 92* 143*  --   --  227  --   --   HEPARINUNFRC 0.58  --   < > 0.26*  --  0.47 0.33  CREATININE 2.74* 2.57*  --   --  2.09*  --   --   < > = values in this interval not displayed.  Estimated Creatinine Clearance: 27.9 mL/min (by C-G formula based on Cr of 2.09).   Medical History: Past Medical History  Diagnosis Date  . Diabetes mellitus without complication   . Hypertension     Assessment: Initial 8 h heparin level = 0.5 on IV heparin drip at 1300 units/hr for RLE ischemia in this  74yo female. Noted that her platelets have been decreasing over the last several days but appear to be stabilizing yesterday. Her Hgb also dropped to 6.2 and she received 1 unit PRBC. Will watch her CBC closely.   Follow-up HL is therapeutic after dropping slightly SUBtherapeutic this morning. No issues with infusion or bleeding reported.  HL remains therapeutic at 0.33 on heparin 1400 units/hr but trending down. No issues with infusion or bleeding per nurse. Will increase slightly to maintain therapeutic level.  Goal of Therapy:  Heparin level 0.3-0.7 units/ml Monitor platelets by anticoagulation protocol: Yes   Plan:  Increase heparin to 1500 units/hr Daily heparin level and CBC Monitor s/sx of bleeding  Andrey Cota. Diona Foley, PharmD Clinical Pharmacist Pager [phone] 12/24/2014,10:06 PM

## 2014-12-24 NOTE — Progress Notes (Signed)
Patient ID: Kathryn Knapp, female   DOB: 25-Oct-1940, 74 y.o.   MRN: [id number] Hemodynamically stable. Right foot the demarcated at the ankle. Some tenderness in her mid to distal calf. Again discussed will need amputation allowing to demarcate. Not toxic from current situation regarding her right lower extremity

## 2014-12-24 NOTE — Progress Notes (Signed)
[place] TEAM 1 - Stepdown/ICU TEAM Progress Note  JAZALYNN MIRELES JAS:[id number] DOB: Feb 19, 1941 DOA: 12/16/2014 PCP: No PCP Per Patient  Admit HPI / Brief Narrative: Patient is a 74 year old WF PMHx hypertension, Diabetes type 2, noncompliance medication Presents with a two-week history of lower abdominal pain. Patient states that the pain is severe, sharp and knife like in quality, and nonradiating. She denies any associated symptoms over the last 2 weeks. She denies any associated nausea, vomiting, or diarrhea. She states that it is a constant pain that does not come and go. She has noted possibly weight loss over the last 3-4 weeks. She also notes shortness of breath with exertion over the last week preventing her from being able to walk to the bus stop with her grandchildren. She denies any associated chest pain. Today, she did note nausea and vomiting although this was after the administration of pain medications. She also noted some diarrhea today as well but denied having any blood in her stool today or before that. She denies feeling any new masses or enlarged lymph nodes throughout her body. She states that she does not take any medications at home. Remotely, she has taken lisinopril for blood pressure and a diabetes medication as well but she had discontinued this as she has not seen a primary care provider in a long time. Otherwise, patient denies any fevers, night sweats, chills, dysuria, hematuria, or constipation. CT abdomen revealed large bowel obstruction and colonic mass. She was prepped for colonoscopy for decompression prior to elective surgery. However a CT chest to look for metastasis revealed large amount free air and pt was taken urgently to OR.  HPI/Subjective: 5/8 A/O 4, states when her son died 19 years ago just stopped taking her BP medication and diabetic medication. Had not seen a physician after his death concerning her health. Currently negative abdominal pain,  negative CP, negative SOB. Positive right lower extremity pain. Agrees that an antidepressant may be beneficial  Assessment/Plan: Acute respiratory failure  - resolved  Septic shock -resolved 5/5  HTN  -controlled -Self D/Ced meds 15 yrs ago   Afib with RVR -Currrently NSR  Acute ischemia to R foot/Rt Foot pain  - Per surgery; Amputation when? 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 -Oncology will decide on treatment options if patient can survive her current multisystem organ failure -Wound care per surgery   Acute Blood Loss Anemia -Hgb 6.2 5/5, no sign of bleeding -improved 5/6 post 1 U PRBC  Melena Ostomy Bag  -Follow hemoglobin carefully. Transfuse for hemoglobin<7  DM Type 2 -Noncompliant for past 15 yrs w/ medication -Continue Sensitive SSI -Hemoglobin A1c pending  Hyperlipidemia  -Lipid panel Within ADA Guidelines, except for low HDL  Hypokalemia -Potassium IV 10 mEq 5 runs -Recheck at 1500  Hypomagnesmia -Magnesium IV 3 g, recheck at 1500  Depression Start Prozac 20 mg daily   Code Status: FULL Family Communication: no family present at time  of exam Disposition Plan: Per surgery    Consultants: Dr.Thomas Cornett (surgery)  Dr.Yan Burr Medico (oncology) Dr.Douglas B McQuaid [facility] M)    Procedure/Significant  Events: CT chest 5/1>>> Large amount of free air and free fluid within the upper abdomen, Small left pleural effusion, 2.5 cm left adrenal lesion again raises concern for metastasis CT abd 4/30>>>Mid colonic obstruction on the basis of an apple core tumor at the distal transverse colon/splenic flexure, measuring up to 8.4 cm, as above. Associated mesenteric/retroperitoneal nodal metastases. 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 5/2 Echo> LVH, LVEF 55%, normal RV size and function, trace pericardial effusion 5/1 OR>> exp lap, Partial colectomy with ileostomy r/t perforated bowel  5/2 Echocardiogram; Left ventricle: mild LVH. -LVEF=55% to 60%. -(grade 1 diastolic dysfunction).- Pericardium, extracardiac: A small to moderate, loculated pericardial effusion identified along Rt ventricular free wall. no evidence hemodynamic compromise. 5/3 Cyanosis R foot, worsening renal failure, made DNR 5/5 Off pressors, UOP up, following commands, Afib with RVR, extubated, oncology consulted 5/6 right lower extremity atrial Doppler;-Proximal posterior tibial artery exhibits occlusion.  -Distal posterior tibial artery thombus.  -Unable to obtain flow in the dorsalis pedis artery. Unable to visualize the peroneal artery -5/6 transfusion 1 unit PRBC   Culture 5/1 MRSA by PCR negative  5/1 blood left wrist/central line negative    Antibiotics: Zosyn 5/1>>> Plan 7 days, re-dosed 5/6 Fluconazole 5/2 >> plan 5 days   DVT prophylaxis: Heparin gtt   Devices    LINES / TUBES:      Continuous Infusions: . sodium chloride Stopped (12/23/14 1800)  . dextrose 5 % and 0.9% NaCl 75 mL/hr at 12/24/14 0915  . heparin 1,400 Units/hr (12/24/14 0430)    Objective: VITAL SIGNS: Temp: 97.6 F (36.4 C) (05/08 0900) Temp Source: Oral (05/08 0900) BP: 125/38 mmHg (05/08 1043) Pulse Rate: 51 (05/08 0800) SPO2; FIO2:   Intake/Output Summary (Last 24 hours) at 12/24/14 1204 Last data filed at 12/24/14  0800  Gross per 24 hour  Intake 2053.5 ml  Output   1065 ml  Net  988.5 ml     Exam: General: A/O 4, right lower extremity pain, No acute respiratory distress Lungs: Clear to auscultation bilaterally without wheezes or crackles Cardiovascular: Regular rate and rhythm without murmur gallop or rub normal S1 and S2 Abdomen: Appropriately tender to palpation, RLQ ostomy bag present with melena discharge, no UQ JP drain serous fluid tinged with small amount of blood, midline incisions covered and clean, positive soft, bowel sounds,no rebound, no ascites, no appreciable mass Extremities: LLE within normal limit, RLE cold mottled painful to palpation negative DP/PT pulse   Data Reviewed: Basic Metabolic Panel:  Recent Labs Lab 12/20/14 0500 12/21/14 0430 12/22/14 0400 12/23/14 0300 12/24/14 0315  NA 133* 140 140 140 140  K 4.0 3.7 3.2* 4.2 2.8*  CL 103 103 102 102 102  CO2 20* 26 28 26 29   GLUCOSE 157* 136* 118* 65* 114*  BUN 38* 40* 45* 50* 42*  CREATININE 2.69* 2.74* 2.74* 2.57* 2.09*  CALCIUM 7.1* 7.3* 7.3* 7.3* 7.1*  MG  --   --   --   --  1.6*  PHOS  --   --   --   --  4.0   Liver Function Tests:  Recent Labs Lab 12/24/14 0315  AST 69*  ALT 45  ALKPHOS 89  BILITOT 0.6  PROT 4.2*  ALBUMIN 1.1*   No results for input(s): LIPASE, AMYLASE in the last 168 hours. No results for input(s):  AMMONIA in the last 168 hours. CBC:  Recent Labs Lab 12/20/14 0500 12/21/14 0430 12/22/14 0400 12/23/14 0300 12/24/14 0315  WBC 15.7* 14.0* 8.9 13.5* 16.7*  NEUTROABS  --  12.2*  --   --  14.4*  HGB 8.3* 6.2* 9.1* 10.5* 8.8*  HCT 25.4* 18.7* 27.6* 31.9* 26.6*  MCV 76.7* 75.4* 78.6 78.6 78.5  PLT 106* 105* 92* 143* 227   Cardiac Enzymes:  Recent Labs Lab 12/18/14 1038  TROPONINI <0.03   BNP (last 3 results) No results for input(s): BNP in the last 8760 hours.  ProBNP (last 3 results) No results for input(s): PROBNP in the last 8760 hours.  CBG:  Recent  Labs Lab 12/23/14 1619 12/23/14 2013 12/23/14 2355 12/24/14 0409 12/24/14 0903  GLUCAP 78 96 108* 107* 104*    Recent Results (from the past 240 hour(s))  Surgical pcr screen     Status: None   Collection Time: 12/17/14  6:20 AM  Result Value Ref Range Status   MRSA, PCR NEGATIVE NEGATIVE Final   Staphylococcus aureus NEGATIVE NEGATIVE Final    Comment:        The Xpert SA Assay (FDA approved for NASAL specimens in patients over 92 years of age), is one component of a comprehensive surveillance program.  Test performance has been validated by [facility] for patients greater than or equal to 77 year old. It is not intended to diagnose infection nor to guide or monitor treatment.   Surgical pcr screen     Status: None   Collection Time: 12/17/14 11:45 AM  Result Value Ref Range Status   MRSA, PCR NEGATIVE NEGATIVE Final   Staphylococcus aureus NEGATIVE NEGATIVE Final    Comment:        The Xpert SA Assay (FDA approved for NASAL specimens in patients over 12 years of age), is one component of a comprehensive surveillance program.  Test performance has been validated by [facility] for patients greater than or equal to 6 year old. It is not intended to diagnose infection nor to guide or monitor treatment.   Culture, blood (routine x 2)     Status: None   Collection Time: 12/17/14  4:25 PM  Result Value Ref Range Status   Specimen Description BLOOD LEFT WRIST  Final   Special Requests BOTTLES DRAWN AEROBIC ONLY 1CC  Final   Culture   Final    NO GROWTH 5 DAYS Note: Culture results may be compromised due to an inadequate volume of blood received in culture bottles. Performed at [facility]    Report Status 12/23/2014 FINAL  Final  Culture, blood (routine x 2)     Status: None   Collection Time: 12/17/14  4:28 PM  Result Value Ref Range Status   Specimen Description BLOOD CENTRAL LINE  Final   Special Requests BOTTLES DRAWN AEROBIC ONLY 10CC  Final    Culture   Final    NO GROWTH 5 DAYS Performed at [facility]    Report Status 12/23/2014 FINAL  Final     Studies:  Recent x-ray studies have been reviewed in detail by the Attending Physician  Scheduled Meds:  Scheduled Meds: . amLODipine  5 mg Oral Daily  . antiseptic oral rinse  7 mL Mouth Rinse q12n4p  . aspirin  81 mg Oral Daily  . chlorhexidine  15 mL Mouth Rinse BID  . FLUoxetine  20 mg Oral Daily  . insulin aspart  0-9 Units Subcutaneous 6 times per  day  . magnesium sulfate 1 - 4 g bolus IVPB  3 g Intravenous Once  . potassium chloride  10 mEq Intravenous Q1 Hr x 5    Time spent on care of this patient: 40 mins   Darcel Frane, Geraldo Docker , MD  [facility] Office  [phone] Pager [phone]  On-Call/Text Page:      [email or web address]      password [email or web address]  If 7PM-7AM, please contact night-coverage [email or web address] Password [email or web address] 12/24/2014, 12:04 PM   LOS: 8 days   Care during the described time interval was provided by me .  I have reviewed this patient's available data, including medical history, events of note, physical examination, radiology studies and test results as part of my evaluation  Dia Crawford, MD [phone] Pager

## 2014-12-25 ENCOUNTER — Inpatient Hospital Stay (HOSPITAL_COMMUNITY): Payer: Medicare Other

## 2014-12-25 DIAGNOSIS — I313 Pericardial effusion (noninflammatory): Secondary | ICD-10-CM

## 2014-12-25 DIAGNOSIS — Z7901 Long term (current) use of anticoagulants: Secondary | ICD-10-CM

## 2014-12-25 DIAGNOSIS — Z933 Colostomy status: Secondary | ICD-10-CM

## 2014-12-25 DIAGNOSIS — Z960 Presence of urogenital implants: Secondary | ICD-10-CM

## 2014-12-25 LAB — HEPARIN LEVEL (UNFRACTIONATED)
Heparin Unfractionated: 0.3 IU/mL (ref 0.30–0.70)
Heparin Unfractionated: 0.33 IU/mL (ref 0.30–0.70)

## 2014-12-25 LAB — GLUCOSE, CAPILLARY
GLUCOSE-CAPILLARY: 143 mg/dL — AB (ref 70–99)
GLUCOSE-CAPILLARY: 142 mg/dL — AB (ref 70–99)
Glucose-Capillary: 116 mg/dL — ABNORMAL HIGH (ref 70–99)
Glucose-Capillary: 131 mg/dL — ABNORMAL HIGH (ref 70–99)
Glucose-Capillary: 141 mg/dL — ABNORMAL HIGH (ref 70–99)

## 2014-12-25 LAB — CBC WITH DIFFERENTIAL/PLATELET
Basophils Absolute: 0 10*3/uL (ref 0.0–0.1)
Basophils Relative: 0 % (ref 0–1)
EOS PCT: 1 % (ref 0–5)
Eosinophils Absolute: 0.2 10*3/uL (ref 0.0–0.7)
HEMATOCRIT: 25.4 % — AB (ref 36.0–46.0)
HEMOGLOBIN: 8.1 g/dL — AB (ref 12.0–15.0)
Lymphocytes Relative: 7 % — ABNORMAL LOW (ref 12–46)
Lymphs Abs: 1.4 10*3/uL (ref 0.7–4.0)
MCH: 25.5 pg — AB (ref 26.0–34.0)
MCHC: 31.9 g/dL (ref 30.0–36.0)
MCV: 79.9 fL (ref 78.0–100.0)
MONO ABS: 1 10*3/uL (ref 0.1–1.0)
Monocytes Relative: 5 % (ref 3–12)
Neutro Abs: 17.8 10*3/uL — ABNORMAL HIGH (ref 1.7–7.7)
Neutrophils Relative %: 87 % — ABNORMAL HIGH (ref 43–77)
Platelets: 297 10*3/uL (ref 150–400)
RBC: 3.18 MIL/uL — AB (ref 3.87–5.11)
RDW: 16.9 % — ABNORMAL HIGH (ref 11.5–15.5)
WBC: 20.4 10*3/uL — ABNORMAL HIGH (ref 4.0–10.5)

## 2014-12-25 LAB — COMPREHENSIVE METABOLIC PANEL
ALBUMIN: 1.1 g/dL — AB (ref 3.5–5.0)
ALT: 34 U/L (ref 14–54)
AST: 49 U/L — AB (ref 15–41)
Alkaline Phosphatase: 86 U/L (ref 38–126)
Anion gap: 8 (ref 5–15)
BUN: 32 mg/dL — ABNORMAL HIGH (ref 6–20)
CO2: 28 mmol/L (ref 22–32)
CREATININE: 1.67 mg/dL — AB (ref 0.44–1.00)
Calcium: 7 mg/dL — ABNORMAL LOW (ref 8.9–10.3)
Chloride: 103 mmol/L (ref 101–111)
GFR calc Af Amer: 34 mL/min — ABNORMAL LOW (ref >60)
GFR, EST NON AFRICAN AMERICAN: 29 mL/min — AB (ref >60)
Glucose, Bld: 138 mg/dL — ABNORMAL HIGH (ref 70–99)
Potassium: 2.9 mmol/L — ABNORMAL LOW (ref 3.5–5.1)
SODIUM: 139 mmol/L (ref 135–145)
TOTAL PROTEIN: 4.1 g/dL — AB (ref 6.5–8.1)
Total Bilirubin: 0.4 mg/dL (ref 0.3–1.2)

## 2014-12-25 LAB — PHOSPHORUS: Phosphorus: 3.1 mg/dL (ref 2.5–4.6)

## 2014-12-25 LAB — HEMOGLOBIN A1C
HEMOGLOBIN A1C: 6.3 % — AB (ref 4.8–5.6)
Mean Plasma Glucose: 134 mg/dL

## 2014-12-25 LAB — MAGNESIUM: MAGNESIUM: 2 mg/dL (ref 1.7–2.4)

## 2014-12-25 MED ORDER — DEXTROSE 5 % IV SOLN
1.5000 g | INTRAVENOUS | Status: AC
  Administered 2014-12-28: 1.5 g via INTRAVENOUS
  Filled 2014-12-25 (×2): qty 1.5

## 2014-12-25 MED ORDER — POTASSIUM CHLORIDE 20 MEQ/15ML (10%) PO SOLN
ORAL | Status: AC
  Administered 2014-12-25: 40 meq
  Filled 2014-12-25: qty 15

## 2014-12-25 MED ORDER — POTASSIUM CHLORIDE CRYS ER 20 MEQ PO TBCR
40.0000 meq | EXTENDED_RELEASE_TABLET | Freq: Two times a day (BID) | ORAL | Status: AC
  Administered 2014-12-25 – 2014-12-26 (×2): 40 meq via ORAL
  Filled 2014-12-25 (×3): qty 2

## 2014-12-25 MED ORDER — IOHEXOL 300 MG/ML  SOLN
25.0000 mL | INTRAMUSCULAR | Status: AC
  Administered 2014-12-25 (×2): 25 mL via ORAL

## 2014-12-25 MED ORDER — HYDRALAZINE HCL 20 MG/ML IJ SOLN: 10.0000 mg | INTRAMUSCULAR | Status: DC | PRN

## 2014-12-25 MED ORDER — OXYCODONE-ACETAMINOPHEN 5-325 MG PO TABS
1.0000 | ORAL_TABLET | ORAL | Status: DC | PRN
  Administered 2014-12-25 – 2014-12-28 (×3): 1 via ORAL
  Administered 2014-12-29: 2 via ORAL
  Administered 2014-12-29: 1 via ORAL
  Administered 2014-12-29 – 2014-12-31 (×4): 2 via ORAL
  Filled 2014-12-25 (×3): qty 1
  Filled 2014-12-25 (×2): qty 2
  Filled 2014-12-25 (×2): qty 1
  Filled 2014-12-25 (×3): qty 2

## 2014-12-25 NOTE — Progress Notes (Signed)
Spoke with pt about proceeding with right below knee amputation.  There is a spot on Tuesday and Thursday.  She would like to proceed with Thursday so she can see her kids before surgery.  Will preop for Thursday.  Dia Jefferys 12/25/2014 1:21 PM

## 2014-12-25 NOTE — Consult Note (Signed)
ANTICOAGULATION CONSULT NOTE -follow up Pharmacy Consult for Heparin Indication: RLE ischemia  No Known Allergies  Patient Measurements: Height: 5\' 9"  (175.3 cm) Weight: 187 lb 9.8 oz (85.1 kg) IBW/kg (Calculated) : 66.2 Heparin Dosing Weight: ~83kg  Vital Signs: Temp: 97.4 F (36.3 C) (05/09 0733) Temp Source: Oral (05/09 0733) BP: 133/84 mmHg (05/09 0600) Pulse Rate: 64 (05/09 0700)  Labs:  Recent Labs  12/23/14 0300  12/24/14 0315 12/24/14 1242 12/24/14 2113 12/25/14 0249 12/25/14 0458  HGB 10.5*  --  8.8*  --   --   --  8.1*  HCT 31.9*  --  26.6*  --   --   --  25.4*  PLT 143*  --  227  --   --   --  297  HEPARINUNFRC  --   < >  --  0.47 0.33 0.30  --   CREATININE 2.57*  --  2.09*  --   --   --  1.67*  < > = values in this interval not displayed.  Estimated Creatinine Clearance: 35 mL/min (by C-G formula based on Cr of 1.67).   Medical History: Past Medical History  Diagnosis Date  . Diabetes mellitus without complication   . Hypertension     Assessment: HL remains therapeutic at 0.3 on heparin 1500 units/hr.  No issues with infusion or bleeding.  Goal of Therapy:  Heparin level 0.3-0.7 units/ml Monitor platelets by anticoagulation protocol: Yes   Plan:  Cont heparin at1500 units/hr Will check HL later today to confirm therapeutic Monitor s/sx of bleeding  Thanks for allowing pharmacy to be a part of this patient's care.  Excell Seltzer, PharmD Clinical Pharmacist, [phone] 12/25/2014,10:30 AM

## 2014-12-25 NOTE — Progress Notes (Signed)
[place] TEAM 1 - Stepdown/ICU TEAM Progress Note  GAYNOR GENCO HTD:[id number] DOB: [birth year] DOA: 12/16/2014 PCP: No PCP Per Patient  Admit HPI / Brief Narrative: 74 year old F Hx hypertension, DM2, and noncompliance w/ medication who presented with a two-week history of lower abdominal pain. She also noted shortness of breath with exertion over a week.  CT abdomen revealed a large bowel obstruction w/ a colonic mass. She was prepped for colonoscopy for decompression prior to elective surgery, however a CT chest to look for metastasis revealed large amount free air and pt was taken urgently to OR.  HPI/Subjective: The patient had no complaints the time of visit.  During the course of my review of her medical records I noted the patient had been admitted to the internal medicine teaching at the beginning of this hospital stay.  I therefore contacted the internal medicine teaching service who agreed to resume care of the patient rather than for the patient to be transitioned to the [facility] service.  [facility] will sign off and I will not charge for my visit today.  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 -Oncology will decide on treatment options  -Wound care per surgery   Acute Blood Loss Anemia -improved post PRBC  Melena Ostomy Bag  -Follow hemoglobin carefully  DM Type 2 -Noncompliant for past 15 yrs w/ medication -Continue Sensitive SSI -A1c  pending  Hyperlipidemia  -Lipid panel Within ADA Guidelines except for low HDL   Hypokalemia -cont to replace and follow   Hypomagnesmia -corrected   Depression -Prozac 20 mg daily  Code Status: FULL Family Communication: no family present at time of exam Disposition Plan: Transfer medical care to any IM Teaching Service  Consultants: General surgery Oncology Critical care  Procedure/Significant Events: CT chest 5/1>>> Large amount of free air and free fluid within the upper abdomen, Small left pleural effusion, 2.5 cm left adrenal lesion again raises concern for metastasis CT abd 4/30>>>Mid colonic obstruction on the basis of an apple core tumor at the distal transverse colon/splenic flexure, measuring up to 8.4 cm, as above. Associated mesenteric/retroperitoneal nodal metastases. 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 5/2 Echo> LVH, LVEF 55%, normal RV size and function, trace pericardial effusion 5/1 OR>> exp lap, Partial colectomy with ileostomy r/t perforated bowel  5/2 Echocardiogram; Left ventricle: mild LVH. -LVEF=55% to 60%. -(grade 1 diastolic dysfunction).- Pericardium, extracardiac: A small to moderate, loculated pericardial effusion identified along Rt ventricular free wall. no evidence hemodynamic compromise. 5/3 Cyanosis R foot, worsening renal failure, made DNR 5/5 Off pressors, UOP up, following commands, Afib with RVR, extubated, oncology consulted 5/6 right lower extremity atrial Doppler;-Proximal posterior tibial artery exhibits occlusion.  -Distal posterior tibial artery thombus.  -Unable to obtain flow in the dorsalis pedis artery. Unable to visualize the peroneal artery -5/6 transfusion 1 unit PRBC  Antibiotics: Zosyn 5/1 > Fluconazole 5/2 >  DVT prophylaxis: Heparin gtt  Objective: Blood pressure 133/84, pulse 64, temperature 97.4 F (36.3 C), temperature source Oral, resp. rate 18, height  5\' 9"  (1.753 m), weight 85.1 kg (187 lb 9.8 oz), SpO2 98  %.  Intake/Output Summary (Last 24 hours) at 12/25/14 1031 Last data filed at 12/25/14 0700  Gross per 24 hour  Intake 2822.8 ml  Output   1265 ml  Net 1557.8 ml   Exam: F/U exam completed   Data Reviewed: Basic Metabolic Panel:  Recent Labs Lab 12/21/14 0430 12/22/14 0400 12/23/14 0300 12/24/14 0315 12/24/14 1718 12/25/14 0458  NA 140 140 140 140  --  139  K 3.7 3.2* 4.2 2.8* 3.2* 2.9*  CL 103 102 102 102  --  103  CO2 26 28 26 29   --  28  GLUCOSE 136* 118* 65* 114*  --  138*  BUN 40* 45* 50* 42*  --  32*  CREATININE 2.74* 2.74* 2.57* 2.09*  --  1.67*  CALCIUM 7.3* 7.3* 7.3* 7.1*  --  7.0*  MG  --   --   --  1.6* 2.2 2.0  PHOS  --   --   --  4.0  --  3.1   Liver Function Tests:  Recent Labs Lab 12/24/14 0315 12/25/14 0458  AST 69* 49*  ALT 45 34  ALKPHOS 89 86  BILITOT 0.6 0.4  PROT 4.2* 4.1*  ALBUMIN 1.1* 1.1*   CBC:  Recent Labs Lab 12/21/14 0430 12/22/14 0400 12/23/14 0300 12/24/14 0315 12/25/14 0458  WBC 14.0* 8.9 13.5* 16.7* 20.4*  NEUTROABS 12.2*  --   --  14.4* 17.8*  HGB 6.2* 9.1* 10.5* 8.8* 8.1*  HCT 18.7* 27.6* 31.9* 26.6* 25.4*  MCV 75.4* 78.6 78.6 78.5 79.9  PLT 105* 92* 143* 227 297   Cardiac Enzymes:  Recent Labs Lab 12/18/14 1038  TROPONINI <0.03   CBG:  Recent Labs Lab 12/24/14 1623 12/24/14 1923 12/24/14 2327 12/25/14 0352 12/25/14 0730  GLUCAP 125* 120* 120* 116* 131*    Recent Results (from the past 240 hour(s))  Surgical pcr screen     Status: None   Collection Time: 12/17/14  6:20 AM  Result Value Ref Range Status   MRSA, PCR NEGATIVE NEGATIVE Final   Staphylococcus aureus NEGATIVE NEGATIVE Final    Comment:        The Xpert SA Assay (FDA approved for NASAL specimens in patients over 50 years of age), is one component of a comprehensive surveillance program.  Test performance has been validated by [facility] for patients greater than or equal to 24 year old. It is not intended to diagnose  infection nor to guide or monitor treatment.   Surgical pcr screen     Status: None   Collection Time: 12/17/14 11:45 AM  Result Value Ref Range Status   MRSA, PCR NEGATIVE NEGATIVE Final   Staphylococcus aureus NEGATIVE NEGATIVE Final    Comment:        The Xpert SA Assay (FDA approved for NASAL specimens in patients over 66 years of age), is one component of a comprehensive surveillance program.  Test performance has been validated by [facility] for patients greater than or equal to 49 year old. It is not intended to diagnose infection nor to guide or monitor treatment.   Culture, blood (routine x 2)     Status: None   Collection Time: 12/17/14  4:25 PM  Result Value Ref Range Status   Specimen Description BLOOD LEFT WRIST  Final   Special Requests BOTTLES DRAWN AEROBIC ONLY [place]  Final   Culture   Final  NO GROWTH 5 DAYS Note: Culture results may be compromised due to an inadequate volume of blood received in culture bottles. Performed at [facility]    Report Status 12/23/2014 FINAL  Final  Culture, blood (routine x 2)     Status: None   Collection Time: 12/17/14  4:28 PM  Result Value Ref Range Status   Specimen Description BLOOD CENTRAL LINE  Final   Special Requests BOTTLES DRAWN AEROBIC ONLY 10CC  Final   Culture   Final    NO GROWTH 5 DAYS Performed at [facility]    Report Status 12/23/2014 FINAL  Final     Studies:  Recent x-ray studies have been reviewed in detail by the Attending Physician  Scheduled Meds:  Scheduled Meds: . amLODipine  5 mg Oral Daily  . antiseptic oral rinse  7 mL Mouth Rinse q12n4p  . aspirin  81 mg Oral Daily  . chlorhexidine  15 mL Mouth Rinse BID  . FLUoxetine  20 mg Oral Daily  . insulin aspart  0-9 Units Subcutaneous 6 times per day    Time spent on care of this patient: no charge  Cherene Altes, MD [facility] For Consults/Admissions - Flow Manager [phone] Office   [phone]  Contact MD directly via text page:      [email or web address]      password [email or web address]  12/25/2014, 10:31 AM   LOS: 9 days

## 2014-12-25 NOTE — Progress Notes (Signed)
   Daily Progress Note  Assessment/Planning: Metastatic colon cancer, s/p Right, transverse, partial left colectomy, partial gastrectomy, and end ileostomy for perforated cecum with large malignant splenic flexure mass invading stomach and diaphragm, R foot gangrene   Pt will need R BKA as R dying foot will become an infectious focus  Looks like schedule might be open on Tuesday or Thursday to proceed if pt is willing  Subjective  - 8 Days Post-Op  R foot hurts, pt open to considering R BKA  Objective Filed Vitals:   12/25/14 0800 12/25/14 1000 12/25/14 1155 12/25/14 1200  BP: 128/78 127/50  128/49  Pulse:      Temp:   97.3 F (36.3 C)   TempSrc:   Oral   Resp: 25 24  24   Height:      Weight:      SpO2: 99%       Intake/Output Summary (Last 24 hours) at 12/25/14 1250 Last data filed at 12/25/14 0700  Gross per 24 hour  Intake 2544.8 ml  Output   1215 ml  Net 1329.8 ml    VASC  R forefoot dead and blistering, no proximal tracking of cellulitis yet  Laboratory CBC    Component Value Date/Time   WBC 20.4* 12/25/2014 0458   HGB 8.1* 12/25/2014 0458   HCT 25.4* 12/25/2014 0458   PLT 297 12/25/2014 0458    BMET    Component Value Date/Time   NA 139 12/25/2014 0458   K 2.9* 12/25/2014 0458   CL 103 12/25/2014 0458   CO2 28 12/25/2014 0458   GLUCOSE 138* 12/25/2014 0458   BUN 32* 12/25/2014 0458   CREATININE 1.67* 12/25/2014 0458   CALCIUM 7.0* 12/25/2014 0458   GFRNONAA 29* 12/25/2014 0458   GFRAA 34* 12/25/2014 [place], MD [facility]: [phone] Pager: [phone]  12/25/2014, 12:50 PM

## 2014-12-25 NOTE — Progress Notes (Signed)
12/25/14  Pharmacy-  Heparin 1625   Heparin level 0.33 on 1500 units/hr  A/P:  74yo female with RLE ischemia and awaiting R-BKA on Thursday.  (+)GI bleeding this admission, but no further bleeding has been noted.  Hg was down to 8.1, pltc wnl.    1-  Continue Heparin 1500 units/hr 2-  Watch for s/s of bleeding 3-  F/U in AM   Gracy Bruins, PharmD [facility]

## 2014-12-25 NOTE — Progress Notes (Signed)
  Date: 12/25/2014  Patient name: Kathryn Knapp  Medical record number: [id number]  Date of birth: 23-Nov-1940   This patient has been seen and the plan of care was discussed with the house staff. Please see their note for complete details. I concur with their findings with the following additions/corrections: Ms Subia was seen with Drs Heber Castorland and [place]. I have reviewed her chart and read Dr Vivia Budge note.  Additonal info   ECHO 5/2 : A small to moderate, loculated pericardial effusion, no hemodynamic compromise.  Dopplers 5/6 : The distal posterior tibial artery exhibits homogenous echoes within the vessel lumen that is mobile; this is suggestive of thombus. She has been in A Fib - suggests thrombus. On Heparin.   Will discuss with team - ? Need for TEE to assess for valve thrombosis since pt has been in A Fib. Would help to clarify anti-coag need after R BKA in light of anemia and GI bleed. Prognosis is poor but pt is currently requesting aggressive care so will cont to W/U.   Bartholomew Crews, MD 12/25/2014, 3:59 PM

## 2014-12-25 NOTE — Progress Notes (Addendum)
Subjective:    Currently, the patient reports feeling better overall. She reports some pain in her right foot that has been responsive to her pain medications. She reports tolerating a full liquid diet for lunch without issue. She recently spoke with vascular surgery, and she is willing to undergo a right BKA for her right foot gangrene. She plans to discuss her goals of care with her daughters tomorrow evening, and she would like to hold off on consulting palliative care until after that discussion.  Interval Events: The patient presented with a two-week history of lower abdominal pain. CT abdomen revealed a large bowel obstruction due to a colonic mass measuring up to 8.4 cm with pericolonic extension and associated lymph node metastases along with suspected left adrenal metastasis. She was initially prepped for a colonoscopy for decompression prior to elective surgery, but a large amount of free air was revealed on chest CT for cancer staging. As result, she was taken urgently to the operating room where she underwent a exploratory laparotomy and partial colectomy with ileostomy. Surgical pathology demonstrated a MMR deficient, perforated, well to moderately differentiated adenocarcinoma invading through the stress of the stomach with 6/13 lymph nodes positive for adenocarcinoma. Multiple satellite tumor nodules were noted within the omentum. Post surgery, she developed cyanosis of her right foot along with worsening renal failure and atrial fibrillation with RVR. She was made DO NOT RESUSCITATE.  She was noted to have melena in her colostomy bag, and her hemoglobin dropped to 6.2, so she was transfused 2 units of RBCs.  She was ultimately weaned off pressors, and her A. fib with RVR resolved.  Oncology was consulted and felt that it would be unlikely that she is a candidate for traditional chemotherapy. However, given the MMR deficient status of her tumor, they offered to try immunotherapy if she is  able to recover from her surgery. A right lower extremity Doppler was performed that showed occlusion of the proximal posterior tibial artery.  Vascular surgery was consulted and plans to perform a right BKA on 12/28/2014.  Given her overall improvement in status, she was transferred out of the ICU today.    Objective:    Vital Signs:   Temp:  [97.3 F (36.3 C)-98.5 F (36.9 C)] 97.3 F (36.3 C) (05/09 1155) Pulse Rate:  [63-88] 64 (05/09 0700) Resp:  [15-31] 17 (05/09 1400) BP: (80-135)/(46-84) 80/69 mmHg (05/09 1400) SpO2:  [93 %-100 %] 99 % (05/09 0800) Last BM Date: 12/24/14 (ostomy)  24-hour weight change: Weight change:   Intake/Output:   Intake/Output Summary (Last 24 hours) at 12/25/14 1419 Last data filed at 12/25/14 0700  Gross per 24 hour  Intake 1976.8 ml  Output    895 ml  Net 1081.8 ml      Physical Exam: General: Well-developed, well-nourished, in no acute distress; alert, appropriate and cooperative throughout examination.  Lungs:  Normal respiratory effort. Clear to auscultation BL without crackles or wheezes.  Heart: RRR. S1 and S2 normal without gallop, murmur, or rubs.  Abdomen:  Drain in place in LLQ with serosanguinous fluid, midline surgical incision dressed, clean dry, and intact.  Colostomy bag in RLQ with brown stool.  Minimal tenderness to palpation, bowel sounds present.  Extremities: Cold, dusky R foot with no palpable pulses (see photo below), R calf tender to palpation.    Labs:  Basic Metabolic Panel:  Recent Labs Lab 12/21/14 0430 12/22/14 0400 12/23/14 0300 12/24/14 0315 12/24/14 1718 12/25/14 0458  NA 140 140 140  140  --  139  K 3.7 3.2* 4.2 2.8* 3.2* 2.9*  CL 103 102 102 102  --  103  CO2 _0 --  28  GLUCOSE 136* 118* 65* 114*  --  138*  BUN 40* 45* 50* 42*  --  32*  CREATININE 2.74* 2.74* 2.57* 2.09*  --  1.67*  CALCIUM 7.3* 7.3* 7.3* 7.1*  --  7.0*  MG  --   --   --  1.6* 2.2 2.0  PHOS  --   --   --  4.0  --  3.1      Liver Function Tests:  Recent Labs Lab 12/24/14 0315 12/25/14 0458  AST 69* 49*  ALT 45 34  ALKPHOS 89 86  BILITOT 0.6 0.4  PROT 4.2* 4.1*  ALBUMIN 1.1* 1.1*   CBC:  Recent Labs Lab 12/21/14 0430 12/22/14 0400 12/23/14 0300 12/24/14 0315 12/25/14 0458  WBC 14.0* 8.9 13.5* 16.7* 20.4*  NEUTROABS 12.2*  --   --  14.4* 17.8*  HGB 6.2* 9.1* 10.5* 8.8* 8.1*  HCT 18.7* 27.6* 31.9* 26.6* 25.4*  MCV 75.4* 78.6 78.6 78.5 79.9  PLT 105* 92* 143* 227 297   CBG:  Recent Labs Lab 12/24/14 1923 12/24/14 2327 12/25/14 0352 12/25/14 0730 12/25/14 1152  GLUCAP 120* 120* 116* 131* 143*    Microbiology: Results for orders placed or performed during the hospital encounter of 12/16/14  Surgical pcr screen     Status: None   Collection Time: 12/17/14  6:20 AM  Result Value Ref Range Status   MRSA, PCR NEGATIVE NEGATIVE Final   Staphylococcus aureus NEGATIVE NEGATIVE Final    Comment:        The Xpert SA Assay (FDA approved for NASAL specimens in patients over 55 years of age), is one component of a comprehensive surveillance program.  Test performance has been validated by [facility] for patients greater than or equal to 4 year old. It is not intended to diagnose infection nor to guide or monitor treatment.   Surgical pcr screen     Status: None   Collection Time: 12/17/14 11:45 AM  Result Value Ref Range Status   MRSA, PCR NEGATIVE NEGATIVE Final   Staphylococcus aureus NEGATIVE NEGATIVE Final    Comment:        The Xpert SA Assay (FDA approved for NASAL specimens in patients over 36 years of age), is one component of a comprehensive surveillance program.  Test performance has been validated by [facility] for patients greater than or equal to 56 year old. It is not intended to diagnose infection nor to guide or monitor treatment.   Culture, blood (routine x 2)     Status: None   Collection Time: 12/17/14  4:25 PM  Result Value Ref Range Status    Specimen Description BLOOD LEFT WRIST  Final   Special Requests BOTTLES DRAWN AEROBIC ONLY 1CC  Final   Culture   Final    NO GROWTH 5 DAYS Note: Culture results may be compromised due to an inadequate volume of blood received in culture bottles. Performed at [facility]    Report Status 12/23/2014 FINAL  Final  Culture, blood (routine x 2)     Status: None   Collection Time: 12/17/14  4:28 PM  Result Value Ref Range Status   Specimen Description BLOOD CENTRAL LINE  Final   Special Requests BOTTLES DRAWN AEROBIC ONLY 10CC  Final   Culture   Final  NO GROWTH 5 DAYS Performed at [facility]    Report Status 12/23/2014 FINAL  Final   Imaging: No results found.     Medications:    Infusions: . sodium chloride Stopped (12/23/14 1800)  . dextrose 5 % and 0.9% NaCl 75 mL/hr at 12/25/14 0400  . heparin 1,500 Units/hr (12/25/14 0400)    Scheduled Medications: . amLODipine  5 mg Oral Daily  . antiseptic oral rinse  7 mL Mouth Rinse q12n4p  . aspirin  81 mg Oral Daily  . [START ON 12/28/2014] cefUROXime (ZINACEF)  IV  1.5 g Intravenous On Call to OR  . chlorhexidine  15 mL Mouth Rinse BID  . FLUoxetine  20 mg Oral Daily  . insulin aspart  0-9 Units Subcutaneous 6 times per day  . potassium chloride  40 mEq Oral BID    PRN Medications: albuterol, fentaNYL (SUBLIMAZE) injection, hydrALAZINE, oxyCODONE-acetaminophen   Assessment/ Plan:    Principal Problem:   Septic shock Active Problems:   Bowel obstruction   Colonic mass   S/P partial colectomy   Severe sepsis with acute organ dysfunction   Noncompliance with medication regimen   Acute respiratory failure with hypoxia   Thrombus   Acute renal failure syndrome   Peritonitis   Colon cancer metastasized to multiple sites   Acute blood loss anemia   Melena   Diabetes type 2, controlled   Hypokalemia   Hypomagnesemia   Depression  #[id number] (Stage IV) colon 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 Her prognosis is poor. However, she is currently interested in trying to treat her cancer possible. She would benefit from a palliative care consult, but she would like to discuss further with her family first. Some thought from oncology that she may benefit from immunotherapy given the fact that her cancer is MMR deficient. However, she has a ways to go before she can initiate immunotherapy. Physical therapy is recommending CIR after surgery. -Wound care per surgery. -Patient will need outpatient follow-up with oncology. -Continue to discuss goals of care. -We'll consult palliative care if patient agrees after discussing with her children. -Continue full liquid diet. -Continue D5 normal saline at 75 mL per hour. -Continue Percocet 5-325, 1-2 tabs every 4 hours as needed. -Continue fentanyl 12.5-25 g IV every 2 hours needed. -Continue work with PT. -Consult OT and rehabilitation after surgery.  #Acute ischemic right foot Most likely secondary to chronic PAD with vasoconstriction from vasopressors needed to support blood pressure. Vascular surgery plan is right BKA on Thursday. -Continue heparin per pharmacy consult. 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 -Nothing by mouth after midnight on Thursday along with cefuroxime in preparation for surgery.  #Septic shock Currently resolved. Patient received 7 days of Zosyn and 5 days of fluconazole. She does not currently have any signs of infection and is afebrile. However, her white blood cell count is increasing, potentially a stress response to her cancer and critical medical condition. -No further antibiotics currently.  #Acute blood loss anemia The patient was noted to have some melena in her colostomy bag while in ICU. This has currently resolved. Her hemoglobin improved to 10.5 after transfusion, but has trended down since that time to 8.1 today. -Continue to  monitor hemoglobin.  #A. fib with RVR Patient is currently in sinus rhythm. She is currently on heparin for her right lower extremity ischemia. This is most likely triggered by her critical medical condition. -Continue heparin as above. -Continue  to monitor on telemetry. -Readdress need for ongoing anticoagulation after discontinuing heparin.  #Hypertension Currently hypotensive. Not on any medications at home. Has been receiving amlodipine in the morning. -Discontinue amlodipine. -Continue hydralazine 10 mg when necessary for SBP greater than 160.  #Acute renal failure Creatinine peaked at 2.74, currently improved to 1.67. Secondary to volume loss and septic shock. Baseline appears to be 0.89 on presentation. Patient has some leakage from around Foley catheter. -IV fluids as above. -Discontinue Foley catheter and monitor for urine output. -Continue to monitor.   #Type 2 diabetes Not on any medications at home. Hemoglobin A1c 6.3 on presentation. Blood sugars well-controlled. -Continue CBGs and sliding scale insulin sensitive ACHS.  #Hypokalemia/hypomagnesemia Potassium remains low this morning. Magnesium normal. -K Dur 40 mEq twice a day. -Continue to monitor BMP and magnesium.  #Depression -Continue Prozac 20 mg daily.   DVT PPX - heparin  CODE STATUS - DNR  CONSULTS PLACED - PCCM, Oncology, General Surgery, Vascular Surgery, GI.  DISPO - Disposition is deferred at this time, awaiting improvement of ischemic foot.   Anticipated discharge in approximately 5-10 day(s).   The patient does not have a current PCP (No PCP Per Patient) and does need an [facility] hospital follow-up appointment after discharge.    Is the [facility] hospital follow-up appointment a one-time only appointment? yes.  Does the patient have transportation limitations that hinder transportation to clinic appointments? yes   SERVICE NEEDED AT [place]         Y = Yes,  Blank = No PT: CIR.  OT:   RN:   Equipment:   Other:      Length of Stay: 9 day(s)   Signed: Charlesetta Shanks, MD  PGY-1, Internal Medicine Resident Pager: [phone] (7AM-5PM) 12/25/2014, 2:19 PM

## 2014-12-25 NOTE — Progress Notes (Signed)
Patient ID: Kathryn Knapp, female   DOB: Aug 27, 1940, 74 y.o.   MRN: [id number]     [facility]      [address] [place], [place] [address]    Phone: [phone] FAX: [phone]     Subjective:  Tolerating clears. Up in chair. Drain with serous output, 9m.  Having ostomy function.   Objective:  Vital signs:  Filed Vitals:   12/25/14 0500 12/25/14 0600 12/25/14 0700 12/25/14 0733  BP:  133/84    Pulse: 77 76 64   Temp:    97.4 F (36.3 C)  TempSrc:    Oral  Resp: _0 Height:      Weight:      SpO2: 97% 100% 98%     Last BM Date: 12/24/14 (ostomy)  Intake/Output   Yesterday:  05/08 0701 - 05/09 0700 In: 3269.8 [P.O.:1050; I.V.:1819.8; IV Piggyback:400] Out: 17096[Urine:1300; Drains:20; Stool:100] This shift: I/O last 3 completed shifts: In: 4378.3 [P.O.:1100; I.V.:2878.3; IV Piggyback:400] Out: 2055 [Urine:1825; Drains:30; Stool:200]    Physical Exam: General: Pt awake/alert/oriented x4 in no acute distress Abdomen: Soft.  Nondistended.  Appropriately tender.  Midline wound is clean.  JP drain with serous output. RLQ ostomy functioning.   No evidence of peritonitis.  No incarcerated hernias.    Problem List:   Principal Problem:   Septic shock Active Problems:   Bowel obstruction   Colonic mass   S/P partial colectomy   Severe sepsis with acute organ dysfunction   Noncompliance with medication regimen   Acute respiratory failure with hypoxia   Thrombus   Acute renal failure syndrome   Peritonitis   Colon cancer metastasized to multiple sites   Acute blood loss anemia   Melena   Diabetes type 2, controlled   Hypokalemia   Hypomagnesemia   Depression    Results:   Labs: Results for orders placed or performed during the hospital encounter of 12/16/14 (from the past 48 hour(s))  Glucose, capillary     Status: None   Collection Time: 12/23/14 11:01 AM  Result Value Ref Range    Glucose-Capillary 76 70 - 99 mg/dL   Comment 1 Capillary Specimen   Glucose, capillary     Status: None   Collection Time: 12/23/14  4:19 PM  Result Value Ref Range   Glucose-Capillary 78 70 - 99 mg/dL  Glucose, capillary     Status: None   Collection Time: 12/23/14  8:13 PM  Result Value Ref Range   Glucose-Capillary 96 70 - 99 mg/dL   Comment 1 Notify RN    Comment 2 Document in Chart   Glucose, capillary     Status: Abnormal   Collection Time: 12/23/14 11:55 PM  Result Value Ref Range   Glucose-Capillary 108 (H) 70 - 99 mg/dL   Comment 1 Notify RN    Comment 2 Document in Chart   Heparin level (unfractionated)     Status: Abnormal   Collection Time: 12/24/14  3:08 AM  Result Value Ref Range   Heparin Unfractionated 0.26 (L) 0.30 - 0.70 IU/mL    Comment:        IF HEPARIN RESULTS ARE BELOW EXPECTED VALUES, AND PATIENT DOSAGE HAS BEEN CONFIRMED, SUGGEST FOLLOW UP TESTING OF ANTITHROMBIN III LEVELS.   CBC with Differential/Platelet     Status: Abnormal   Collection Time: 12/24/14  3:15 AM  Result Value Ref Range   WBC 16.7 (H) 4.0 - 10.5  K/uL   RBC 3.39 (L) 3.87 - 5.11 MIL/uL   Hemoglobin 8.8 (L) 12.0 - 15.0 g/dL   HCT 26.6 (L) 36.0 - 46.0 %   MCV 78.5 78.0 - 100.0 fL   MCH 26.0 26.0 - 34.0 pg   MCHC 33.1 30.0 - 36.0 g/dL   RDW 16.7 (H) 11.5 - 15.5 %   Platelets 227 150 - 400 K/uL   Neutrophils Relative % 86 (H) 43 - 77 %   Lymphocytes Relative 8 (L) 12 - 46 %   Monocytes Relative 5 3 - 12 %   Eosinophils Relative 1 0 - 5 %   Basophils Relative 0 0 - 1 %   Neutro Abs 14.4 (H) 1.7 - 7.7 K/uL   Lymphs Abs 1.3 0.7 - 4.0 K/uL   Monocytes Absolute 0.8 0.1 - 1.0 K/uL   Eosinophils Absolute 0.2 0.0 - 0.7 K/uL   Basophils Absolute 0.0 0.0 - 0.1 K/uL   WBC Morphology ATYPICAL LYMPHOCYTES   Comprehensive metabolic panel     Status: Abnormal   Collection Time: 12/24/14  3:15 AM  Result Value Ref Range   Sodium 140 135 - 145 mmol/L   Potassium 2.8 (L) 3.5 - 5.1 mmol/L     Comment: DELTA CHECK NOTED   Chloride 102 101 - 111 mmol/L   CO2 29 22 - 32 mmol/L   Glucose, Bld 114 (H) 70 - 99 mg/dL   BUN 42 (H) 6 - 20 mg/dL   Creatinine, Ser 2.09 (H) 0.44 - 1.00 mg/dL   Calcium 7.1 (L) 8.9 - 10.3 mg/dL   Total Protein 4.2 (L) 6.5 - 8.1 g/dL   Albumin 1.1 (L) 3.5 - 5.0 g/dL   AST 69 (H) 15 - 41 U/L   ALT 45 14 - 54 U/L   Alkaline Phosphatase 89 38 - 126 U/L   Total Bilirubin 0.6 0.3 - 1.2 mg/dL   GFR calc non Af Amer 22 (L) >60 mL/min   GFR calc Af Amer 26 (L) >60 mL/min    Comment: (NOTE) The eGFR has been calculated using the CKD EPI equation. This calculation has not been validated in all clinical situations. eGFR's persistently <60 mL/min signify possible Chronic Kidney Disease.    Anion gap 9 5 - 15  Magnesium     Status: Abnormal   Collection Time: 12/24/14  3:15 AM  Result Value Ref Range   Magnesium 1.6 (L) 1.7 - 2.4 mg/dL  Phosphorus     Status: None   Collection Time: 12/24/14  3:15 AM  Result Value Ref Range   Phosphorus 4.0 2.5 - 4.6 mg/dL  Lipid panel     Status: Abnormal   Collection Time: 12/24/14  3:15 AM  Result Value Ref Range   Cholesterol 109 0 - 200 mg/dL   Triglycerides 156 (H) <150 mg/dL   HDL 14 (L) >40 mg/dL   Total CHOL/HDL Ratio 7.8 RATIO   VLDL 31 0 - 40 mg/dL   LDL Cholesterol 64 0 - 99 mg/dL    Comment:        Total Cholesterol/HDL:CHD Risk Coronary Heart Disease Risk Table                     Men   Women  1/2 Average Risk   3.4   3.3  Average Risk       5.0   4.4  2 X Average Risk   9.6   7.1  3 X Average Risk  23.4   11.0        Use the calculated Patient Ratio above and the CHD Risk Table to determine the patient's CHD Risk.        ATP III CLASSIFICATION (LDL):  <100     mg/dL   Optimal  100-129  mg/dL   Near or Above                    Optimal  130-159  mg/dL   Borderline  160-189  mg/dL   High  >190     mg/dL   Very High   Glucose, capillary     Status: Abnormal   Collection Time: 12/24/14  4:09 AM   Result Value Ref Range   Glucose-Capillary 107 (H) 70 - 99 mg/dL   Comment 1 Notify RN    Comment 2 Document in Chart   Glucose, capillary     Status: Abnormal   Collection Time: 12/24/14  9:03 AM  Result Value Ref Range   Glucose-Capillary 104 (H) 70 - 99 mg/dL  Glucose, capillary     Status: Abnormal   Collection Time: 12/24/14 12:35 PM  Result Value Ref Range   Glucose-Capillary 123 (H) 70 - 99 mg/dL   Comment 1 Notify RN   Heparin level (unfractionated)     Status: None   Collection Time: 12/24/14 12:42 PM  Result Value Ref Range   Heparin Unfractionated 0.47 0.30 - 0.70 IU/mL    Comment:        IF HEPARIN RESULTS ARE BELOW EXPECTED VALUES, AND PATIENT DOSAGE HAS BEEN CONFIRMED, SUGGEST FOLLOW UP TESTING OF ANTITHROMBIN III LEVELS.   Glucose, capillary     Status: Abnormal   Collection Time: 12/24/14  4:23 PM  Result Value Ref Range   Glucose-Capillary 125 (H) 70 - 99 mg/dL   Comment 1 Notify RN   Potassium     Status: Abnormal   Collection Time: 12/24/14  5:18 PM  Result Value Ref Range   Potassium 3.2 (L) 3.5 - 5.1 mmol/L  Magnesium     Status: None   Collection Time: 12/24/14  5:18 PM  Result Value Ref Range   Magnesium 2.2 1.7 - 2.4 mg/dL  Glucose, capillary     Status: Abnormal   Collection Time: 12/24/14  7:23 PM  Result Value Ref Range   Glucose-Capillary 120 (H) 70 - 99 mg/dL  Heparin level (unfractionated)     Status: None   Collection Time: 12/24/14  9:13 PM  Result Value Ref Range   Heparin Unfractionated 0.33 0.30 - 0.70 IU/mL    Comment:        IF HEPARIN RESULTS ARE BELOW EXPECTED VALUES, AND PATIENT DOSAGE HAS BEEN CONFIRMED, SUGGEST FOLLOW UP TESTING OF ANTITHROMBIN III LEVELS.   Glucose, capillary     Status: Abnormal   Collection Time: 12/24/14 11:27 PM  Result Value Ref Range   Glucose-Capillary 120 (H) 70 - 99 mg/dL   Comment 1 Notify RN   Heparin level (unfractionated)     Status: None   Collection Time: 12/25/14  2:49 AM  Result  Value Ref Range   Heparin Unfractionated 0.30 0.30 - 0.70 IU/mL    Comment:        IF HEPARIN RESULTS ARE BELOW EXPECTED VALUES, AND PATIENT DOSAGE HAS BEEN CONFIRMED, SUGGEST FOLLOW UP TESTING OF ANTITHROMBIN III LEVELS.   Glucose, capillary     Status: Abnormal   Collection Time: 12/25/14  3:52 AM  Result Value Ref  Range   Glucose-Capillary 116 (H) 70 - 99 mg/dL   Comment 1 Notify RN   CBC with Differential/Platelet     Status: Abnormal   Collection Time: 12/25/14  4:58 AM  Result Value Ref Range   WBC 20.4 (H) 4.0 - 10.5 K/uL   RBC 3.18 (L) 3.87 - 5.11 MIL/uL   Hemoglobin 8.1 (L) 12.0 - 15.0 g/dL   HCT 25.4 (L) 36.0 - 46.0 %   MCV 79.9 78.0 - 100.0 fL   MCH 25.5 (L) 26.0 - 34.0 pg   MCHC 31.9 30.0 - 36.0 g/dL   RDW 16.9 (H) 11.5 - 15.5 %   Platelets 297 150 - 400 K/uL   Neutrophils Relative % 87 (H) 43 - 77 %   Lymphocytes Relative 7 (L) 12 - 46 %   Monocytes Relative 5 3 - 12 %   Eosinophils Relative 1 0 - 5 %   Basophils Relative 0 0 - 1 %   Neutro Abs 17.8 (H) 1.7 - 7.7 K/uL   Lymphs Abs 1.4 0.7 - 4.0 K/uL   Monocytes Absolute 1.0 0.1 - 1.0 K/uL   Eosinophils Absolute 0.2 0.0 - 0.7 K/uL   Basophils Absolute 0.0 0.0 - 0.1 K/uL   RBC Morphology POLYCHROMASIA PRESENT    WBC Morphology TOXIC GRANULATION   Comprehensive metabolic panel     Status: Abnormal   Collection Time: 12/25/14  4:58 AM  Result Value Ref Range   Sodium 139 135 - 145 mmol/L   Potassium 2.9 (L) 3.5 - 5.1 mmol/L   Chloride 103 101 - 111 mmol/L   CO2 28 22 - 32 mmol/L   Glucose, Bld 138 (H) 70 - 99 mg/dL   BUN 32 (H) 6 - 20 mg/dL   Creatinine, Ser 1.67 (H) 0.44 - 1.00 mg/dL   Calcium 7.0 (L) 8.9 - 10.3 mg/dL   Total Protein 4.1 (L) 6.5 - 8.1 g/dL   Albumin 1.1 (L) 3.5 - 5.0 g/dL   AST 49 (H) 15 - 41 U/L   ALT 34 14 - 54 U/L   Alkaline Phosphatase 86 38 - 126 U/L   Total Bilirubin 0.4 0.3 - 1.2 mg/dL   GFR calc non Af Amer 29 (L) >60 mL/min   GFR calc Af Amer 34 (L) >60 mL/min    Comment:  (NOTE) The eGFR has been calculated using the CKD EPI equation. This calculation has not been validated in all clinical situations. eGFR's persistently <60 mL/min signify possible Chronic Kidney Disease.    Anion gap 8 5 - 15  Magnesium     Status: None   Collection Time: 12/25/14  4:58 AM  Result Value Ref Range   Magnesium 2.0 1.7 - 2.4 mg/dL  Phosphorus     Status: None   Collection Time: 12/25/14  4:58 AM  Result Value Ref Range   Phosphorus 3.1 2.5 - 4.6 mg/dL  Glucose, capillary     Status: Abnormal   Collection Time: 12/25/14  7:30 AM  Result Value Ref Range   Glucose-Capillary 131 (H) 70 - 99 mg/dL   Comment 1 Capillary Specimen    Comment 2 Notify RN     Imaging / Studies: No results found.  Medications / Allergies:  Scheduled Meds: . amLODipine  5 mg Oral Daily  . antiseptic oral rinse  7 mL Mouth Rinse q12n4p  . aspirin  81 mg Oral Daily  . chlorhexidine  15 mL Mouth Rinse BID  . FLUoxetine  20 mg Oral  Daily  . insulin aspart  0-9 Units Subcutaneous 6 times per day   Continuous Infusions: . sodium chloride Stopped (12/23/14 1800)  . dextrose 5 % and 0.9% NaCl 75 mL/hr at 12/25/14 0400  . heparin 1,500 Units/hr (12/25/14 0400)   PRN Meds:.albuterol, fentaNYL (SUBLIMAZE) injection, hydrALAZINE  Antibiotics: Anti-infectives    Start     Dose/Rate Route Frequency Ordered Stop   12/22/14 1000  piperacillin-tazobactam (ZOSYN) IVPB 2.25 g     2.25 g 100 mL/hr over 30 Minutes Intravenous Every 6 hours 12/22/14 0949 12/23/14 2230   12/19/14 1600  fluconazole (DIFLUCAN) IVPB 200 mg     200 mg 100 mL/hr over 60 Minutes Intravenous Every 24 hours 12/19/14 0901 12/23/14 1600   12/18/14 1100  fluconazole (DIFLUCAN) IVPB 400 mg  Status:  Discontinued     400 mg 100 mL/hr over 120 Minutes Intravenous Every 24 hours 12/18/14 1036 12/19/14 0901   12/17/14 1100  piperacillin-tazobactam (ZOSYN) IVPB 3.375 g  Status:  Discontinued     3.375 g 12.5 mL/hr over 240 Minutes  Intravenous Every 8 hours 12/17/14 0956 12/22/14 0911   12/17/14 0600  cefoTEtan (CEFOTAN) 2 g in dextrose 5 % 50 mL IVPB     2 g 100 mL/hr over 30 Minutes Intravenous To Surgery 12/17/14 0542 12/17/14 0726      Assessment/Plan Perforation of cecum, 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 Grandville Silos -WOC following  -BID wet to dry dressing changes -drain care  -path-adeno with mets. d/w patient and family.  Hem/onc consulted ID-zosyn since 5/1---> fluconazole 5/2  WBC up, afebrile.  Low threshold to repeat CT of abdomen for possible abscess since the patient was perforated.  Repeat CBC in AM.  Pt also has ischemic foot for which VS is following. VTE prophylaxis-SCD/heparin FEN-advance diet as tolerated.  Add PO pain meds.  Erby Pian, [facility] Pager [phone]) For consults and floor pages call [phone])  12/25/2014 9:49 AM

## 2014-12-26 DIAGNOSIS — K631 Perforation of intestine (nontraumatic): Secondary | ICD-10-CM

## 2014-12-26 LAB — CBC
HCT: 24.6 % — ABNORMAL LOW (ref 36.0–46.0)
Hemoglobin: 7.8 g/dL — ABNORMAL LOW (ref 12.0–15.0)
MCH: 25.2 pg — AB (ref 26.0–34.0)
MCHC: 31.7 g/dL (ref 30.0–36.0)
MCV: 79.4 fL (ref 78.0–100.0)
PLATELETS: 345 10*3/uL (ref 150–400)
RBC: 3.1 MIL/uL — ABNORMAL LOW (ref 3.87–5.11)
RDW: 17.2 % — AB (ref 11.5–15.5)
WBC: 23.5 10*3/uL — ABNORMAL HIGH (ref 4.0–10.5)

## 2014-12-26 LAB — GLUCOSE, CAPILLARY
GLUCOSE-CAPILLARY: 116 mg/dL — AB (ref 70–99)
GLUCOSE-CAPILLARY: 140 mg/dL — AB (ref 70–99)
Glucose-Capillary: 114 mg/dL — ABNORMAL HIGH (ref 70–99)
Glucose-Capillary: 110 mg/dL — ABNORMAL HIGH (ref 70–99)
Glucose-Capillary: 117 mg/dL — ABNORMAL HIGH (ref 70–99)
Glucose-Capillary: 113 mg/dL — ABNORMAL HIGH (ref 70–99)

## 2014-12-26 LAB — HEPARIN LEVEL (UNFRACTIONATED)
Heparin Unfractionated: 0.16 IU/mL — ABNORMAL LOW (ref 0.30–0.70)
Heparin Unfractionated: 0.49 IU/mL (ref 0.30–0.70)

## 2014-12-26 LAB — BASIC METABOLIC PANEL
Anion gap: 6 (ref 5–15)
BUN: 21 mg/dL — ABNORMAL HIGH (ref 6–20)
CO2: 28 mmol/L (ref 22–32)
Calcium: 7.1 mg/dL — ABNORMAL LOW (ref 8.9–10.3)
Chloride: 106 mmol/L (ref 101–111)
Creatinine, Ser: 1.28 mg/dL — ABNORMAL HIGH (ref 0.44–1.00)
GFR calc non Af Amer: 40 mL/min — ABNORMAL LOW (ref >60)
GFR, EST AFRICAN AMERICAN: 47 mL/min — AB (ref >60)
Glucose, Bld: 128 mg/dL — ABNORMAL HIGH (ref 70–99)
Potassium: 3.5 mmol/L (ref 3.5–5.1)
Sodium: 140 mmol/L (ref 135–145)

## 2014-12-26 LAB — MAGNESIUM: Magnesium: 1.9 mg/dL (ref 1.7–2.4)

## 2014-12-26 MED ORDER — HEPARIN BOLUS VIA INFUSION
2000.0000 [IU] | Freq: Once | INTRAVENOUS | Status: AC
  Administered 2014-12-26: 2000 [IU] via INTRAVENOUS
  Filled 2014-12-26: qty 2000

## 2014-12-26 NOTE — Consult Note (Addendum)
WOC wound follow up Wound type: Pt had deep tissue injury to buttocks/sacrum which has not evolved at this time, but has decreased in size since previous assessment; 2X1cm darker colored skin to inner buttocks; no open wound or drainage.  Continue present plan of care with foam dressing to protect and promote healing. 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  Pt watched and asked appropriate questions.  Will continue teaching when stable and out of ICU. Enrolled patient in [place] Start Discharge program: No Julien Girt MSN, [place], [place] Sam, [place]

## 2014-12-26 NOTE — Progress Notes (Addendum)
Subjective:    The patient reports feeling better this morning. She says that the pain in her lower abdomen has improved, and she has been urinating normally after removal of her Foley catheter. She also reports increased pain in her right leg. She says that her daughters are coming in this morning to visit her and discuss goals of care. She is willing to have palliative care come by to see her as well.  Interval Events: -CT abdomen with diffuse body wall edema, loculated fluid within the abdomen and pelvis.    Objective:    Vital Signs:   Temp:  [97.2 F (36.2 C)-97.4 F (36.3 C)] 97.3 F (36.3 C) (05/10 0754) Pulse Rate:  [55-71] 71 (05/09 2200) Resp:  [13-27] 21 (05/10 0800) BP: (80-159)/(44-94) 118/50 mmHg (05/10 0800) SpO2:  [96 %-100 %] 100 % (05/10 0200) Last BM Date: 12/25/14  24-hour weight change: Weight change:   Intake/Output:   Intake/Output Summary (Last 24 hours) at 12/26/14 0941 Last data filed at 12/26/14 0700  Gross per 24 hour  Intake 1985.1 ml  Output   2030 ml  Net  -44.9 ml      Physical Exam: General: Well-developed, well-nourished, in no acute distress; alert, appropriate and cooperative throughout examination.  Lungs:  Normal respiratory effort. Clear to auscultation BL without crackles or wheezes.  Heart: RRR. S1 and S2 normal without gallop, murmur, or rubs.  Abdomen:  Drain in place in LLQ with serosanguinous fluid, midline surgical incision dressed, clean dry, and intact.  Colostomy bag in RLQ with brown stool.  Minimal tenderness to palpation, bowel sounds present.  Extremities: Cold, dusky R foot with no palpable pulses, R calf tender to palpation.   Labs:  Basic Metabolic Panel:  Recent Labs Lab 12/22/14 0400 12/23/14 0300 12/24/14 0315 12/24/14 1718 12/25/14 0458 12/26/14 0230 12/26/14 0820  NA 140 140 140  --  139  --  140  K 3.2* 4.2 2.8* 3.2* 2.9*  --  3.5  CL 102 102 102  --  103  --  106  CO2 28 26 29   --  28  --  28    GLUCOSE 118* 65* 114*  --  138*  --  128*  BUN 45* 50* 42*  --  32*  --  21*  CREATININE 2.74* 2.57* 2.09*  --  1.67*  --  1.28*  CALCIUM 7.3* 7.3* 7.1*  --  7.0*  --  7.1*  MG  --   --  1.6* 2.2 2.0 1.9  --   PHOS  --   --  4.0  --  3.1  --   --     Liver Function Tests:  Recent Labs Lab 12/24/14 0315 12/25/14 0458  AST 69* 49*  ALT 45 34  ALKPHOS 89 86  BILITOT 0.6 0.4  PROT 4.2* 4.1*  ALBUMIN 1.1* 1.1*   CBC:  Recent Labs Lab 12/21/14 0430 12/22/14 0400 12/23/14 0300 12/24/14 0315 12/25/14 0458 12/26/14 0230  WBC 14.0* 8.9 13.5* 16.7* 20.4* 23.5*  NEUTROABS 12.2*  --   --  14.4* 17.8*  --   HGB 6.2* 9.1* 10.5* 8.8* 8.1* 7.8*  HCT 18.7* 27.6* 31.9* 26.6* 25.4* 24.6*  MCV 75.4* 78.6 78.6 78.5 79.9 79.4  PLT 105* 92* 143* 227 297 345   CBG:  Recent Labs Lab 12/25/14 1630 12/25/14 2021 12/26/14 0032 12/26/14 0422 12/26/14 0752  GLUCAP 141* 142* 140* 116* 114*    Microbiology: Results for orders placed or performed  during the hospital encounter of 12/16/14  Surgical pcr screen     Status: None   Collection Time: 12/17/14  6:20 AM  Result Value Ref Range Status   MRSA, PCR NEGATIVE NEGATIVE Final   Staphylococcus aureus NEGATIVE NEGATIVE Final    Comment:        The Xpert SA Assay (FDA approved for NASAL specimens in patients over 3 years of age), is one component of a comprehensive surveillance program.  Test performance has been validated by [facility] for patients greater than or equal to 20 year old. It is not intended to diagnose infection nor to guide or monitor treatment.   Surgical pcr screen     Status: None   Collection Time: 12/17/14 11:45 AM  Result Value Ref Range Status   MRSA, PCR NEGATIVE NEGATIVE Final   Staphylococcus aureus NEGATIVE NEGATIVE Final    Comment:        The Xpert SA Assay (FDA approved for NASAL specimens in patients over 28 years of age), is one component of a comprehensive surveillance program.  Test  performance has been validated by [facility] for patients greater than or equal to 41 year old. It is not intended to diagnose infection nor to guide or monitor treatment.   Culture, blood (routine x 2)     Status: None   Collection Time: 12/17/14  4:25 PM  Result Value Ref Range Status   Specimen Description BLOOD LEFT WRIST  Final   Special Requests BOTTLES DRAWN AEROBIC ONLY 1CC  Final   Culture   Final    NO GROWTH 5 DAYS Note: Culture results may be compromised due to an inadequate volume of blood received in culture bottles. Performed at [facility]    Report Status 12/23/2014 FINAL  Final  Culture, blood (routine x 2)     Status: None   Collection Time: 12/17/14  4:28 PM  Result Value Ref Range Status   Specimen Description BLOOD CENTRAL LINE  Final   Special Requests BOTTLES DRAWN AEROBIC ONLY 10CC  Final   Culture   Final    NO GROWTH 5 DAYS Performed at [facility]    Report Status 12/23/2014 FINAL  Final   Imaging: Ct Abdomen Pelvis Wo Contrast  12/25/2014   CLINICAL DATA:  Abdominal pain.  Status post exploratory laparotomy.  EXAM: CT ABDOMEN AND PELVIS WITHOUT CONTRAST  TECHNIQUE: Multidetector CT imaging of the abdomen and pelvis was performed following the standard protocol without IV contrast.  COMPARISON:  12/16/2014  FINDINGS: Lower chest: Bilateral pleural effusions are identified, left greater than right. There is compressive type consolidation involving both lower lobes.  Hepatobiliary: There is no suspicious liver abnormality. Status post cholecystectomy. The common bile duct measures 8 mm.  Pancreas: Normal appearance of the pancreas.  Spleen: Splenic granulomas are again noted.  Adrenals/Urinary Tract: The right adrenal gland appears normal. Nodule in the left adrenal gland is unchanged measuring 1.9 by 1.6 cm, image 28/series 2. The right kidney appears normal. The left kidney is also normal. Urinary bladder contains a small amount of gas  which may be related to recent instrumentation  Stomach/Bowel: Unremarkable appearance of the stomach. The small bowel loops are mildly increased in caliber. The patient has a right lower quadrant ileostomy status post right hemicolectomy. The left colon, sigmoid colon and rectum are unremarkable.  Vascular/Lymphatic: Calcified atherosclerotic disease involves the abdominal aorta. No aneurysm. Multiple prominent retroperitoneal lymph nodes are identified, similar to the previous exam.  Index left-sided retroperitoneal node measures 1.2 cm, image 39/ series 2. Similar to previous exam. 9 mm periaortic lymph node is also unchanged from previous study. No pelvic or inguinal adenopathy.  Reproductive: The uterus appears within normal limits. Under the adnexal structures are unremarkable.  Other: There is diffuse body wall edema. Multiple areas of mildly complex, intermediate attenuation fluid are identified within the abdomen and pelvis. These are difficult to characterize without IV contrast material. Focal fluid collection within the cul-de-sac may represent loculated ascites. This measures 5.6 x 3.9 cm. Within the right lower quadrant of the abdomen there is a fluid collection which measures 6.7 x3.8 cm, image 25/series 5. Within the left upper quadrant of the abdomen fluid collection measures 3.6 x 3.8 cm. Area of possible loculated fluid in the left upper quadrant of the abdomen measures 5 cm, image 23/series 2. Peritoneal nodularity is again identified compatible with peritoneal metastasis. Open ventral abdominal wall wound is again noted.  Musculoskeletal: Spondylosis noted within the lower thoracic and lower lumbar spine. No aggressive lytic or sclerotic bone lesions identified.  IMPRESSION: 1. Postoperative changes compatible with right hemicolectomy and right lower quadrant ileostomy. 2. Diffuse body wall edema, bilateral pleural effusions are identified consistent with fluid overload state. 3. Multiple areas  of loculated fluid are identified within the abdomen or pelvis. Evaluation is limited due to lack of IV contrast material. Findings may represent loculated ascites secondary to peritoneal metastasis. Multiple abdominal and pelvic abscess ease cannot be excluded. Finally in this patient who is on heparin intra-abdominal hemorrhage/hematomas cannot be excluded. 4. Diffuse peritoneal nodularity compatible with peritoneal metastasis.   Electronically Signed   By: Kerby Moors M.D.   On: 12/25/2014 21:36       Medications:    Infusions: . sodium chloride Stopped (12/23/14 1800)  . dextrose 5 % and 0.9% NaCl 75 mL/hr at 12/26/14 0700  . heparin 1,700 Units/hr (12/26/14 0700)    Scheduled Medications: . antiseptic oral rinse  7 mL Mouth Rinse q12n4p  . aspirin  81 mg Oral Daily  . [START ON 12/28/2014] cefUROXime (ZINACEF)  IV  1.5 g Intravenous On Call to OR  . chlorhexidine  15 mL Mouth Rinse BID  . FLUoxetine  20 mg Oral Daily  . insulin aspart  0-9 Units Subcutaneous 6 times per day  . potassium chloride  40 mEq Oral BID    PRN Medications: albuterol, fentaNYL (SUBLIMAZE) injection, hydrALAZINE, oxyCODONE-acetaminophen   Assessment/ Plan:    Principal Problem:   Septic shock Active Problems:   Bowel obstruction   Colonic mass   S/P partial colectomy   Severe sepsis with acute organ dysfunction   Noncompliance with medication regimen   Acute respiratory failure with hypoxia   Thrombus   Acute renal failure syndrome   Peritonitis   Colon cancer metastasized to multiple sites   Acute blood loss anemia   Melena   Diabetes type 2, controlled   Hypokalemia   Hypomagnesemia   Depression  #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 She is willing to discuss goals of care with palliative care, and her daughters are coming this  morning as well. I think a palliative care consult will be helpful given the multiple complicated aspects of her current medical situation. Diffuse body wall edema secondary to poor nutrition, albumin 1.1. -Consulted palliative care to discuss goals of care. -Patient will need  outpatient follow-up with oncology. -Card modified diet. -Continue D5 normal saline at 75 mL per hour given resolving AKI. -Continue Percocet 5-325, 1-2 tabs every 4 hours as needed. -Continue fentanyl 12.5-25 g IV every 2 hours needed. -Continue work with PT. -Consult OT and rehabilitation after surgery.  #Acute ischemic right foot secondary to thrombus from atrial fibrillation Lower extremity Doppler demonstrated findings consistent with a thrombus. She has been in atrial fibrillation, which is the most likely cause of the clot.  Discussed with Dr. Haroldine Laws of cardiology who does not think that this is a septic embolus secondary to a valvular vegetation, and he does not recommend echocardiogram to evaluate for this. The patient has been afebrile off of antibiotics making endocarditis unlikely. Given the known atrial fibrillation, she has an indication for anticoagulation as long as she is able to tolerate it. She currently remains in sinus rhythm. -Continue heparin per pharmacy consult and aspirin 81 mg daily. -Indefinite anticoagulation will need to be continued at discharge. 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 -Nothing by mouth after midnight on Thursday along with cefuroxime in preparation for surgery. -Continue to monitor on telemetry.  #Septic shock secondary to GI perforation White blood cell count continues to rise, and multiple fluid loculations in the abdomen and pelvis representing ascites versus abscess versus hemorrhage. Hemoglobin has been slowly decreasing, but concern for abscess with rising white blood cell count. Although, the white blood cell count could be secondary to her ischemic foot. Discussed with general  surgery who will consider percutaneous drain. -Percutaneous drain per general surgery. -No additional antibiotics for now.  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 Patient does not have any evidence of hemodynamic compromise. Discuss with Dr. Haroldine Laws who thinks it a malignant pericardial effusion given her metastatic colon cancer. He does not recommend any further workup as it would not affect her management at this time as she does not have any symptoms related to the effusion. He recommends a repeat echocardiogram in 4-6 months to monitor for progression. -Repeat echocardiogram in 4-6 months.  #Acute blood loss anemia Hemoglobin continues to trend down. Possibly, slow oozing into her peritoneal cavity due to heparin. -Continue to monitor hemoglobin.  #Hypertension Significant fluctuation in blood pressure from 29B to 284 systolic. We'll not pursue aggressive blood pressure control at this time given her other comorbidities. -Continue hydralazine 10 mg when necessary for SBP greater than 160.  #Acute renal failure Creatinine continues to improve to 1.28 today. Urinating well without Foley. -Continue IV fluids as above for today.  #Type 2 diabetes Good blood sugar control. -Continue CBGs and sliding scale insulin sensitive ACHS.  #Hypokalemia/hypomagnesemia Potassium normal this morning. Received KDur 40 mEq this morning. -Continue to monitor BMP and magnesium.   #Depression -Continue Prozac 20 mg daily.   DVT PPX - heparin  CODE STATUS - DNR  CONSULTS PLACED - PCCM, Oncology, General Surgery, Vascular Surgery, GI.  DISPO - Disposition is deferred at this time, awaiting improvement of ischemic foot.   Anticipated discharge in approximately 5-10 day(s).   The patient does not have a current PCP (No PCP Per Patient) and does need an [facility] hospital follow-up appointment after discharge.    Is the  [facility] follow-up appointment a one-time only appointment? yes.  Does the patient have transportation limitations that hinder transportation to clinic appointments? yes   [place]  Y = Yes, [place] = No PT: CIR.  OT:   RN:   Equipment:   Other:      Length of Stay: 10 day(s)   Signed: Charlesetta Shanks, MD  PGY-1, Internal Medicine Resident Pager: [phone] (7AM-5PM) 12/26/2014, 9:41 AM

## 2014-12-26 NOTE — Consult Note (Signed)
WOC wound follow up [facility] following for assessment and plan of care to abd wound. Wound type: Pt had deep tissue injury to buttocks/sacrum which has not evolved at this time, but has decreased in size since previous assessment; 2X1cm darker colored skin to inner buttocks; no open wound or drainage.  Continue present plan of care with foam dressing to protect and promote healing. 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  Pt watched and asked appropriate questions.  Will continue teaching when stable and out of ICU. Enrolled patient in [facility] Start Discharge program: No Julien Girt MSN, [place], [place], [place]

## 2014-12-26 NOTE — Progress Notes (Signed)
  Date: 12/26/2014  Patient name: Kathryn Knapp  Medical record number: [id number]  Date of birth: [birth year]   This patient has been seen and the plan of care was discussed with the house staff. Please see their note for complete details. I concur with their findings with the following additions/corrections: Kathryn Knapp and I saw pt originally in 2S when being transferred from chair to bed via [place] and again in [facility]. She has sig leg pain, esp when being moved. Also pain in lower ABD. Her daughters are to come in this AM and Dr Trudee Kuster discussed Palliative care with pt again and now she is wiling to have consult. He is concerned about increasing WBC - could likely be R leg as source. 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 Anticoagulation, per Dr Vivia Budge conversation with cards, indefinitely. Palliative consult.  Bartholomew Crews, MD 12/26/2014, 11:08 AM

## 2014-12-26 NOTE — Progress Notes (Signed)
Patient ID: Kathryn Knapp, female   DOB: 1940/12/14, 74 y.o.   MRN: [id number]     [facility]      [address] [place], [place] [address]    Phone: [phone] FAX: [phone]     Subjective:  Tolerating full liquids.  Up in chair. Having ostomy function.   Objective:  Vital signs:  Filed Vitals:   12/26/14 0200 12/26/14 0400 12/26/14 0600 12/26/14 0754  BP: 132/44 145/50 136/80   Pulse:      Temp:  97.4 F (36.3 C)  97.3 F (36.3 C)  TempSrc:  Oral  Oral  Resp: $Remo'15 20 21   'ybZzN$ Height:      Weight:      SpO2: 100%       Last BM Date: 12/25/14  Intake/Output   Yesterday:  05/09 0701 - 05/10 0700 In: 2165.1 [I.V.:2165.1] Out: 2045 [Urine:1365; Drains:30; Stool:650] This shift: I/O last 3 completed shifts: In: 3241.9 [I.V.:3241.9] Out: 2635 [Urine:1895; Drains:40; Stool:700]    Physical Exam: General: Pt awake/alert/oriented x4 in no acute distress Abdomen: Soft.  Nondistended.  Appropriately tender.  Midline wound is clean.  JP drain with serous output. RLQ ostomy functioning.   No evidence of peritonitis.  No incarcerated hernias.    Problem List:   Principal Problem:   Septic shock Active Problems:   Bowel obstruction   Colonic mass   S/P partial colectomy   Severe sepsis with acute organ dysfunction   Noncompliance with medication regimen   Acute respiratory failure with hypoxia   Thrombus   Acute renal failure syndrome   Peritonitis   Colon cancer metastasized to multiple sites   Acute blood loss anemia   Melena   Diabetes type 2, controlled   Hypokalemia   Hypomagnesemia   Depression    Results:   Labs: Results for orders placed or performed during the hospital encounter of 12/16/14 (from the past 48 hour(s))  Glucose, capillary     Status: Abnormal   Collection Time: 12/24/14  9:03 AM  Result Value Ref Range   Glucose-Capillary 104 (H) 70 - 99 mg/dL  Glucose, capillary     Status:  Abnormal   Collection Time: 12/24/14 12:35 PM  Result Value Ref Range   Glucose-Capillary 123 (H) 70 - 99 mg/dL   Comment 1 Notify RN   Heparin level (unfractionated)     Status: None   Collection Time: 12/24/14 12:42 PM  Result Value Ref Range   Heparin Unfractionated 0.47 0.30 - 0.70 IU/mL    Comment:        IF HEPARIN RESULTS ARE BELOW EXPECTED VALUES, AND PATIENT DOSAGE HAS BEEN CONFIRMED, SUGGEST FOLLOW UP TESTING OF ANTITHROMBIN III LEVELS.   Glucose, capillary     Status: Abnormal   Collection Time: 12/24/14  4:23 PM  Result Value Ref Range   Glucose-Capillary 125 (H) 70 - 99 mg/dL   Comment 1 Notify RN   Potassium     Status: Abnormal   Collection Time: 12/24/14  5:18 PM  Result Value Ref Range   Potassium 3.2 (L) 3.5 - 5.1 mmol/L  Magnesium     Status: None   Collection Time: 12/24/14  5:18 PM  Result Value Ref Range   Magnesium 2.2 1.7 - 2.4 mg/dL  Glucose, capillary     Status: Abnormal   Collection Time: 12/24/14  7:23 PM  Result Value Ref Range   Glucose-Capillary 120 (H) 70 - 99 mg/dL  Heparin level (unfractionated)     Status: None   Collection Time: 12/24/14  9:13 PM  Result Value Ref Range   Heparin Unfractionated 0.33 0.30 - 0.70 IU/mL    Comment:        IF HEPARIN RESULTS ARE BELOW EXPECTED VALUES, AND PATIENT DOSAGE HAS BEEN CONFIRMED, SUGGEST FOLLOW UP TESTING OF ANTITHROMBIN III LEVELS.   Glucose, capillary     Status: Abnormal   Collection Time: 12/24/14 11:27 PM  Result Value Ref Range   Glucose-Capillary 120 (H) 70 - 99 mg/dL   Comment 1 Notify RN   Heparin level (unfractionated)     Status: None   Collection Time: 12/25/14  2:49 AM  Result Value Ref Range   Heparin Unfractionated 0.30 0.30 - 0.70 IU/mL    Comment:        IF HEPARIN RESULTS ARE BELOW EXPECTED VALUES, AND PATIENT DOSAGE HAS BEEN CONFIRMED, SUGGEST FOLLOW UP TESTING OF ANTITHROMBIN III LEVELS.   Glucose, capillary     Status: Abnormal   Collection Time: 12/25/14   3:52 AM  Result Value Ref Range   Glucose-Capillary 116 (H) 70 - 99 mg/dL   Comment 1 Notify RN   CBC with Differential/Platelet     Status: Abnormal   Collection Time: 12/25/14  4:58 AM  Result Value Ref Range   WBC 20.4 (H) 4.0 - 10.5 K/uL   RBC 3.18 (L) 3.87 - 5.11 MIL/uL   Hemoglobin 8.1 (L) 12.0 - 15.0 g/dL   HCT 25.4 (L) 36.0 - 46.0 %   MCV 79.9 78.0 - 100.0 fL   MCH 25.5 (L) 26.0 - 34.0 pg   MCHC 31.9 30.0 - 36.0 g/dL   RDW 16.9 (H) 11.5 - 15.5 %   Platelets 297 150 - 400 K/uL   Neutrophils Relative % 87 (H) 43 - 77 %   Lymphocytes Relative 7 (L) 12 - 46 %   Monocytes Relative 5 3 - 12 %   Eosinophils Relative 1 0 - 5 %   Basophils Relative 0 0 - 1 %   Neutro Abs 17.8 (H) 1.7 - 7.7 K/uL   Lymphs Abs 1.4 0.7 - 4.0 K/uL   Monocytes Absolute 1.0 0.1 - 1.0 K/uL   Eosinophils Absolute 0.2 0.0 - 0.7 K/uL   Basophils Absolute 0.0 0.0 - 0.1 K/uL   RBC Morphology POLYCHROMASIA PRESENT    WBC Morphology TOXIC GRANULATION   Comprehensive metabolic panel     Status: Abnormal   Collection Time: 12/25/14  4:58 AM  Result Value Ref Range   Sodium 139 135 - 145 mmol/L   Potassium 2.9 (L) 3.5 - 5.1 mmol/L   Chloride 103 101 - 111 mmol/L   CO2 28 22 - 32 mmol/L   Glucose, Bld 138 (H) 70 - 99 mg/dL   BUN 32 (H) 6 - 20 mg/dL   Creatinine, Ser 1.67 (H) 0.44 - 1.00 mg/dL   Calcium 7.0 (L) 8.9 - 10.3 mg/dL   Total Protein 4.1 (L) 6.5 - 8.1 g/dL   Albumin 1.1 (L) 3.5 - 5.0 g/dL   AST 49 (H) 15 - 41 U/L   ALT 34 14 - 54 U/L   Alkaline Phosphatase 86 38 - 126 U/L   Total Bilirubin 0.4 0.3 - 1.2 mg/dL   GFR calc non Af Amer 29 (L) >60 mL/min   GFR calc Af Amer 34 (L) >60 mL/min    Comment: (NOTE) The eGFR has been calculated using the CKD EPI equation. This calculation  has not been validated in all clinical situations. eGFR's persistently <60 mL/min signify possible Chronic Kidney Disease.    Anion gap 8 5 - 15  Magnesium     Status: None   Collection Time: 12/25/14  4:58 AM   Result Value Ref Range   Magnesium 2.0 1.7 - 2.4 mg/dL  Phosphorus     Status: None   Collection Time: 12/25/14  4:58 AM  Result Value Ref Range   Phosphorus 3.1 2.5 - 4.6 mg/dL  Glucose, capillary     Status: Abnormal   Collection Time: 12/25/14  7:30 AM  Result Value Ref Range   Glucose-Capillary 131 (H) 70 - 99 mg/dL   Comment 1 Capillary Specimen    Comment 2 Notify RN   Glucose, capillary     Status: Abnormal   Collection Time: 12/25/14 11:52 AM  Result Value Ref Range   Glucose-Capillary 143 (H) 70 - 99 mg/dL   Comment 1 Capillary Specimen    Comment 2 Notify RN   Heparin level (unfractionated)     Status: None   Collection Time: 12/25/14  3:22 PM  Result Value Ref Range   Heparin Unfractionated 0.33 0.30 - 0.70 IU/mL    Comment:        IF HEPARIN RESULTS ARE BELOW EXPECTED VALUES, AND PATIENT DOSAGE HAS BEEN CONFIRMED, SUGGEST FOLLOW UP TESTING OF ANTITHROMBIN III LEVELS.   Glucose, capillary     Status: Abnormal   Collection Time: 12/25/14  4:30 PM  Result Value Ref Range   Glucose-Capillary 141 (H) 70 - 99 mg/dL   Comment 1 Capillary Specimen    Comment 2 Notify RN   Glucose, capillary     Status: Abnormal   Collection Time: 12/25/14  8:21 PM  Result Value Ref Range   Glucose-Capillary 142 (H) 70 - 99 mg/dL  Glucose, capillary     Status: Abnormal   Collection Time: 12/26/14 12:32 AM  Result Value Ref Range   Glucose-Capillary 140 (H) 70 - 99 mg/dL   Comment 1 Notify RN    Comment 2 Document in Chart   Heparin level (unfractionated)     Status: Abnormal   Collection Time: 12/26/14  2:30 AM  Result Value Ref Range   Heparin Unfractionated 0.16 (L) 0.30 - 0.70 IU/mL    Comment:        IF HEPARIN RESULTS ARE BELOW EXPECTED VALUES, AND PATIENT DOSAGE HAS BEEN CONFIRMED, SUGGEST FOLLOW UP TESTING OF ANTITHROMBIN III LEVELS.   CBC     Status: Abnormal   Collection Time: 12/26/14  2:30 AM  Result Value Ref Range   WBC 23.5 (H) 4.0 - 10.5 K/uL   RBC 3.10  (L) 3.87 - 5.11 MIL/uL   Hemoglobin 7.8 (L) 12.0 - 15.0 g/dL   HCT 24.6 (L) 36.0 - 46.0 %   MCV 79.4 78.0 - 100.0 fL   MCH 25.2 (L) 26.0 - 34.0 pg   MCHC 31.7 30.0 - 36.0 g/dL   RDW 17.2 (H) 11.5 - 15.5 %   Platelets 345 150 - 400 K/uL  Magnesium     Status: None   Collection Time: 12/26/14  2:30 AM  Result Value Ref Range   Magnesium 1.9 1.7 - 2.4 mg/dL  Glucose, capillary     Status: Abnormal   Collection Time: 12/26/14  4:22 AM  Result Value Ref Range   Glucose-Capillary 116 (H) 70 - 99 mg/dL   Comment 1 Notify RN    Comment 2 Document in Chart  Imaging / Studies: Ct Abdomen Pelvis Wo Contrast  12/25/2014   CLINICAL DATA:  Abdominal pain.  Status post exploratory laparotomy.  EXAM: CT ABDOMEN AND PELVIS WITHOUT CONTRAST  TECHNIQUE: Multidetector CT imaging of the abdomen and pelvis was performed following the standard protocol without IV contrast.  COMPARISON:  12/16/2014  FINDINGS: Lower chest: Bilateral pleural effusions are identified, left greater than right. There is compressive type consolidation involving both lower lobes.  Hepatobiliary: There is no suspicious liver abnormality. Status post cholecystectomy. The common bile duct measures 8 mm.  Pancreas: Normal appearance of the pancreas.  Spleen: Splenic granulomas are again noted.  Adrenals/Urinary Tract: The right adrenal gland appears normal. Nodule in the left adrenal gland is unchanged measuring 1.9 by 1.6 cm, image 28/series 2. The right kidney appears normal. The left kidney is also normal. Urinary bladder contains a small amount of gas which may be related to recent instrumentation  Stomach/Bowel: Unremarkable appearance of the stomach. The small bowel loops are mildly increased in caliber. The patient has a right lower quadrant ileostomy status post right hemicolectomy. The left colon, sigmoid colon and rectum are unremarkable.  Vascular/Lymphatic: Calcified atherosclerotic disease involves the abdominal aorta. No  aneurysm. Multiple prominent retroperitoneal lymph nodes are identified, similar to the previous exam. Index left-sided retroperitoneal node measures 1.2 cm, image 39/ series 2. Similar to previous exam. 9 mm periaortic lymph node is also unchanged from previous study. No pelvic or inguinal adenopathy.  Reproductive: The uterus appears within normal limits. Under the adnexal structures are unremarkable.  Other: There is diffuse body wall edema. Multiple areas of mildly complex, intermediate attenuation fluid are identified within the abdomen and pelvis. These are difficult to characterize without IV contrast material. Focal fluid collection within the cul-de-sac may represent loculated ascites. This measures 5.6 x 3.9 cm. Within the right lower quadrant of the abdomen there is a fluid collection which measures 6.7 x3.8 cm, image 25/series 5. Within the left upper quadrant of the abdomen fluid collection measures 3.6 x 3.8 cm. Area of possible loculated fluid in the left upper quadrant of the abdomen measures 5 cm, image 23/series 2. Peritoneal nodularity is again identified compatible with peritoneal metastasis. Open ventral abdominal wall wound is again noted.  Musculoskeletal: Spondylosis noted within the lower thoracic and lower lumbar spine. No aggressive lytic or sclerotic bone lesions identified.  IMPRESSION: 1. Postoperative changes compatible with right hemicolectomy and right lower quadrant ileostomy. 2. Diffuse body wall edema, bilateral pleural effusions are identified consistent with fluid overload state. 3. Multiple areas of loculated fluid are identified within the abdomen or pelvis. Evaluation is limited due to lack of IV contrast material. Findings may represent loculated ascites secondary to peritoneal metastasis. Multiple abdominal and pelvic abscess ease cannot be excluded. Finally in this patient who is on heparin intra-abdominal hemorrhage/hematomas cannot be excluded. 4. Diffuse peritoneal  nodularity compatible with peritoneal metastasis.   Electronically Signed   By: Kerby Moors M.D.   On: 12/25/2014 21:36    Medications / Allergies:  Scheduled Meds: . antiseptic oral rinse  7 mL Mouth Rinse q12n4p  . aspirin  81 mg Oral Daily  . [START ON 12/28/2014] cefUROXime (ZINACEF)  IV  1.5 g Intravenous On Call to OR  . chlorhexidine  15 mL Mouth Rinse BID  . FLUoxetine  20 mg Oral Daily  . insulin aspart  0-9 Units Subcutaneous 6 times per day  . potassium chloride  40 mEq Oral BID   Continuous Infusions: . sodium  chloride Stopped (12/23/14 1800)  . dextrose 5 % and 0.9% NaCl 75 mL/hr at 12/26/14 0700  . heparin 1,700 Units/hr (12/26/14 0700)   PRN Meds:.albuterol, fentaNYL (SUBLIMAZE) injection, hydrALAZINE, oxyCODONE-acetaminophen  Antibiotics: Anti-infectives    Start     Dose/Rate Route Frequency Ordered Stop   12/28/14 0800  cefUROXime (ZINACEF) 1.5 g in dextrose 5 % 50 mL IVPB     1.5 g 100 mL/hr over 30 Minutes Intravenous On call to O.R. 12/25/14 1324 12/29/14 0559   12/22/14 1000  piperacillin-tazobactam (ZOSYN) IVPB 2.25 g     2.25 g 100 mL/hr over 30 Minutes Intravenous Every 6 hours 12/22/14 0949 12/23/14 2230   12/19/14 1600  fluconazole (DIFLUCAN) IVPB 200 mg     200 mg 100 mL/hr over 60 Minutes Intravenous Every 24 hours 12/19/14 0901 12/23/14 1600   12/18/14 1100  fluconazole (DIFLUCAN) IVPB 400 mg  Status:  Discontinued     400 mg 100 mL/hr over 120 Minutes Intravenous Every 24 hours 12/18/14 1036 12/19/14 0901   12/17/14 1100  piperacillin-tazobactam (ZOSYN) IVPB 3.375 g  Status:  Discontinued     3.375 g 12.5 mL/hr over 240 Minutes Intravenous Every 8 hours 12/17/14 0956 12/22/14 0911   12/17/14 0600  cefoTEtan (CEFOTAN) 2 g in dextrose 5 % 50 mL IVPB     2 g 100 mL/hr over 30 Minutes Intravenous To Surgery 12/17/14 0542 12/17/14 0726      Assessment/Plan Perforation of cecum, large obstructing tumor at the splenic flexure POD#8 exploratory  laparotomy right, transverse, partial 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 Grandville Silos -[facility] following  -BID wet to dry dressing changes -drain care  -path-adeno with mets. d/w patient and family.  Hem/onc consulted ID-zosyn since 5/1---> fluconazole 5/2  WBC up, afebrile.  Pt also has ischemic foot for which VS is following. VTE prophylaxis-SCD/heparin FEN-advance diet to regular  PO pain meds.  12/26/2014 8:20 AM

## 2014-12-26 NOTE — Consult Note (Signed)
ANTICOAGULATION CONSULT NOTE Pharmacy Consult for Heparin Indication: RLE ischemia  No Known Allergies  Patient Measurements: Height: 5\' 11"  (180.3 cm) Weight: 194 lb 3.6 oz (88.1 kg) IBW/kg (Calculated) : 70.8 Heparin Dosing Weight: ~83kg  Vital Signs: Temp: 97 F (36.1 C) (05/10 1100) Temp Source: Axillary (05/10 1100) BP: 141/57 mmHg (05/10 1100) Pulse Rate: 77 (05/10 1100)  Labs:  Recent Labs  12/24/14 0315  12/25/14 0458 12/25/14 1522 12/26/14 0230 12/26/14 0820 12/26/14 1150  HGB 8.8*  --  8.1*  --  7.8*  --   --   HCT 26.6*  --  25.4*  --  24.6*  --   --   PLT 227  --  297  --  345  --   --   HEPARINUNFRC  --   < >  --  0.33 0.16*  --  0.49  CREATININE 2.09*  --  1.67*  --   --  1.28*  --   < > = values in this interval not displayed.  Estimated Creatinine Clearance: 48 mL/min (by C-G formula based on Cr of 1.28).   Medical History: Past Medical History  Diagnosis Date  . Diabetes mellitus without complication   . Hypertension     Assessment: HL remains therapeutic on heparin 1700 units/hr.  No issues with infusion or bleeding. Drop in hgb, 10.5 > 8.1 > 7.8, plts wnl.  Goal of Therapy:  Heparin level 0.3-0.7 units/ml Monitor platelets by anticoagulation protocol: Yes    Plan:  Cont heparin at 1700 units/hr Daily HL, CBC Monitor s/sx of bleeding    Hughes Better, PharmD, BCPS Clinical Pharmacist Pager: [phone] 12/26/2014 1:33 PM

## 2014-12-26 NOTE — Progress Notes (Signed)
ANTICOAGULATION CONSULT NOTE - Follow Up Consult  Pharmacy Consult for heparin Indication: RLE ischemia   Labs:  Recent Labs  12/24/14 0315  12/25/14 0249 12/25/14 0458 12/25/14 1522 12/26/14 0230  HGB 8.8*  --   --  8.1*  --  7.8*  HCT 26.6*  --   --  25.4*  --  24.6*  PLT 227  --   --  297  --  345  HEPARINUNFRC  --   < > 0.30  --  0.33 0.16*  CREATININE 2.09*  --   --  1.67*  --   --   < > = values in this interval not displayed.    Assessment: 73yo female now subtherapeutic on heparin after a few levels at low end of goal, RN denies any gtt issues or interruptions.  Goal of Therapy:  Heparin level 0.3-0.7 units/ml   Plan:  Will rebolus with heparin 2000 units and increase gtt by 3 units/kg/hr to 1700 units/hr and check level in [place], PharmD, BCPS  12/26/2014,4:25 AM

## 2014-12-26 NOTE — Progress Notes (Signed)
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 Developed ischemic Rt foot; currently awaiting amputation PMHx- DM, HTN    PT Comments    Pt fatigued, yet willing "to do what I can. I can't wait until I can get up and move around...eventhough it will be on one foot." Provided bed exercises for upper and lower extremities. No family present for education, however handout provided.   Follow Up Recommendations  CIR (after surgery)     Equipment Recommendations   (TBA after surgery)    Recommendations for Other Services       Precautions / Restrictions Precautions Precautions: Fall    Mobility  Bed Mobility                  Transfers                 General transfer comment: pt reports up in chair x 5 hrs this morning (nsg used lift)  Ambulation/Gait                 Stairs            Wheelchair Mobility    Modified Rankin (Stroke Patients Only)       Balance                                    Cognition Arousal/Alertness: Awake/alert Behavior During Therapy: WFL for tasks assessed/performed Overall Cognitive Status: Within Functional Limits for tasks assessed                      Exercises General Exercises - Lower Extremity Ankle Circles/Pumps:  (emphasized heel cord stretch/ankle pumps on Left) Quad Sets: AROM;Both;10 reps Heel Slides: AAROM;Right;5 reps Hip ABduction/ADduction: AAROM;Right;5 reps Straight Leg Raises: AAROM;Right;Other reps (comment) Other Exercises Other Exercises: issued orange theraband and tied to upper bed rails; educated pt  on elbow extension and shoulder extension/horizontal abduction (using left band in Rt hand); pt repeated x 10 reps each    General Comments General comments (skin integrity, edema, etc.): Pt reports her family left about an hour ago and they had assisted her doing AAROM of RLE; reported she was fatigued, but agreed to do a few exercises to be sure she understood the handout provided      Pertinent Vitals/Pain Pain Assessment: 0-10 Pain Score: 8  Pain Location: Right leg Pain Descriptors / Indicators: Burning Pain Intervention(s): Limited activity within patient's tolerance;Monitored during session;Premedicated before session;Repositioned    Home Living                      Prior Function            PT Goals (current goals can now be found in the care plan section) Acute Rehab PT Goals Patient Stated Goal: return home with family assist Time For Goal Achievement: 01/01/15 Progress towards PT goals: Progressing toward goals    Frequency  Min 3X/week    PT Plan Other (comment) (TBA after surgery for RLE amputation)    Co-evaluation  End of Session Equipment Utilized During Treatment: Other (comment) (orange theraband) Activity Tolerance: Patient limited by fatigue Patient left: in bed;with call bell/phone within reach     Time: 1547-1605 PT Time Calculation (min) (ACUTE ONLY): 18 min  Charges:  $Therapeutic Exercise: 8-22 mins                    G Codes:      [name] 2015/01/21, 4:23 PM Pager [phone]

## 2014-12-27 DIAGNOSIS — Z515 Encounter for palliative care: Secondary | ICD-10-CM | POA: Insufficient documentation

## 2014-12-27 DIAGNOSIS — M25569 Pain in unspecified knee: Secondary | ICD-10-CM | POA: Insufficient documentation

## 2014-12-27 DIAGNOSIS — R109 Unspecified abdominal pain: Secondary | ICD-10-CM

## 2014-12-27 DIAGNOSIS — M25561 Pain in right knee: Secondary | ICD-10-CM

## 2014-12-27 LAB — BASIC METABOLIC PANEL
ANION GAP: 6 (ref 5–15)
BUN: 18 mg/dL (ref 6–20)
CHLORIDE: 106 mmol/L (ref 101–111)
CO2: 27 mmol/L (ref 22–32)
CREATININE: 1.18 mg/dL — AB (ref 0.44–1.00)
Calcium: 7.2 mg/dL — ABNORMAL LOW (ref 8.9–10.3)
GFR calc non Af Amer: 45 mL/min — ABNORMAL LOW (ref >60)
GFR, EST AFRICAN AMERICAN: 52 mL/min — AB (ref >60)
Glucose, Bld: 118 mg/dL — ABNORMAL HIGH (ref 70–99)
Potassium: 3.5 mmol/L (ref 3.5–5.1)
Sodium: 139 mmol/L (ref 135–145)

## 2014-12-27 LAB — CBC
HCT: 22.9 % — ABNORMAL LOW (ref 36.0–46.0)
Hemoglobin: 7.2 g/dL — ABNORMAL LOW (ref 12.0–15.0)
MCH: 25.5 pg — ABNORMAL LOW (ref 26.0–34.0)
MCHC: 31.4 g/dL (ref 30.0–36.0)
MCV: 81.2 fL (ref 78.0–100.0)
PLATELETS: 401 10*3/uL — AB (ref 150–400)
RBC: 2.82 MIL/uL — AB (ref 3.87–5.11)
RDW: 18 % — ABNORMAL HIGH (ref 11.5–15.5)
WBC: 20.2 10*3/uL — AB (ref 4.0–10.5)

## 2014-12-27 LAB — HEPARIN LEVEL (UNFRACTIONATED): HEPARIN UNFRACTIONATED: 0.39 [IU]/mL (ref 0.30–0.70)

## 2014-12-27 LAB — GLUCOSE, CAPILLARY
GLUCOSE-CAPILLARY: 119 mg/dL — AB (ref 70–99)
Glucose-Capillary: 131 mg/dL — ABNORMAL HIGH (ref 70–99)
Glucose-Capillary: 109 mg/dL — ABNORMAL HIGH (ref 70–99)
Glucose-Capillary: 149 mg/dL — ABNORMAL HIGH (ref 70–99)
Glucose-Capillary: 136 mg/dL — ABNORMAL HIGH (ref 70–99)
Glucose-Capillary: 132 mg/dL — ABNORMAL HIGH (ref 70–99)

## 2014-12-27 LAB — PREPARE RBC (CROSSMATCH)

## 2014-12-27 MED ORDER — ENSURE ENLIVE PO LIQD
237.0000 mL | Freq: Two times a day (BID) | ORAL | Status: DC
  Administered 2014-12-27 – 2015-01-01 (×9): 237 mL via ORAL

## 2014-12-27 MED ORDER — SODIUM CHLORIDE 0.9 % IV SOLN
Freq: Once | INTRAVENOUS | Status: AC
  Administered 2014-12-27: 16:00:00 via INTRAVENOUS

## 2014-12-27 NOTE — Care Management Note (Signed)
    Page 1 of 1   12/27/2014     2:51:54 PM CARE MANAGEMENT NOTE 12/27/2014  Patient:  Kathryn Knapp, Kathryn Knapp   Account Number:  [id number]  Date Initiated:  12/27/2014  Documentation initiated by:  Samyah Bilbo  Subjective/Objective Assessment:   dx septic shock; lives with dtr and 4 grandchildren     Action/Plan:   Anticipates rehab @ CIR vs SNF after amputation   Anticipated DC Date:     Anticipated DC Plan:  IP REHAB FACILITY  In-house referral  Clinical Social Worker      DC Forensic scientist  CM consult      Choice offered to / List presented to:             Status of service:  In process, will continue to follow Medicare Important Message given?  YES (If response is "NO", the following Medicare IM given date fields will be blank) Date Medicare IM given:  12/27/2014 Medicare IM given by:  Tarina Volk Date Additional Medicare IM given:   Additional Medicare IM given by:    Per UR Regulation:  Reviewed for med. necessity/level of care/duration of stay  Comments:  12/27/14 [place] MSN BSN CCM Pt states she and dtr were evicted per court order right before her admission to the hospital.  She has until June 1st to move.  She reports she may be homeless after a rehab stay.  Advised pt to direct dtr to apply with [facility] and provided address and phone number for agency.

## 2014-12-27 NOTE — Evaluation (Signed)
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 Developed ischemic Rt foot; currently awaiting amputation PMHx- DM, HTN   Clinical Impression   Prior to admission, pt was independent in ADL, IADL and mobility. Presents with decreased activity tolerance, generalized weakness, impaired balance, longstanding R hand limitations and B UE edema, and R LE pain interfering with ability to perform self care and ADL transfers. Pt requires +1 assist for bed level mobility and use of mechanical lift for OOB to chair. Pt will need extensive rehab prior to return home.  Will follow acutely.    Follow Up Recommendations  CIR;Supervision/Assistance - 24 hour    Equipment Recommendations  Other (comment) (defer to next venue)    Recommendations for Other Services       Precautions / Restrictions Precautions Precautions: Fall Restrictions Weight Bearing Restrictions: No      Mobility Bed Mobility Overal bed mobility: Needs Assistance Bed Mobility: Rolling Rolling: Min assist            Transfers Overall transfer level: Needs assistance Equipment used:  (requires lift equipment)             General transfer comment: nursing using lift equipment    Balance                                            ADL Overall ADL's : Needs assistance/impaired Eating/Feeding: Set up;Bed level   Grooming: Wash/dry hands;Wash/dry face;Set up;Brushing hair;Maximal assistance;Sitting;Bed level   Upper Body Bathing: Moderate assistance;Bed level   Lower Body Bathing: Total assistance;Bed level   Upper Body  Dressing : Minimal assistance;Bed level   Lower Body Dressing: Total assistance;Bed level     Toilet Transfer Details (indicate cue type and reason): assisted to roll to R side for bedpan Toileting- Clothing Manipulation and Hygiene: Total assistance;Bed level               Vision     Perception     Praxis      Pertinent Vitals/Pain Pain Assessment: Faces Faces Pain Scale: Hurts whole lot Pain Location: R LE Pain Descriptors / Indicators: Constant;Burning Pain Intervention(s): Limited activity within patient's tolerance;Monitored during session;Repositioned     Hand Dominance Right   Extremity/Trunk Assessment Upper Extremity Assessment Upper Extremity Assessment: RUE deficits/detail;LUE deficits/detail RUE Deficits / Details: 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, moderate edema, generalized weakness RUE Coordination: decreased fine motor LUE Deficits / Details: generalized weakness, moderate edema   Lower Extremity Assessment Lower Extremity Assessment: Defer to PT evaluation       Communication Communication Communication: No difficulties   Cognition Arousal/Alertness: Awake/alert Behavior During Therapy: WFL for tasks assessed/performed Overall Cognitive Status: Within Functional Limits for tasks assessed                     General Comments       Exercises   Other Exercises Other Exercises: Level 2 theraband tied to upper bed rails;  pt performed elbow extension and shoulder extension/horizontal abduction (using left band in Rt hand); pt repeated x 10 reps  each   Shoulder Instructions      Home Living Family/patient expects to be discharged to:: Private residence Living Arrangements: Children;Other relatives (3 young grandchildren) Available Help at Discharge: Family;Available 24 hours/day Type of Home: Mobile home Home Access: Stairs to enter Entrance Stairs-Number of Steps: 4 Entrance Stairs-Rails: Right;Left;Can reach  both Home Layout: One level         Bathroom Toilet: Standard         Additional Comments: pt is at risk for losing her home, case managment is involved      Prior Functioning/Environment Level of Independence: Independent             OT Diagnosis: Generalized weakness;Acute pain   OT Problem List: Decreased activity tolerance;Decreased strength;Decreased range of motion;Impaired balance (sitting and/or standing);Decreased coordination;Decreased knowledge of use of DME or AE;Obesity;Pain;Impaired UE functional use;Increased edema   OT Treatment/Interventions: Self-care/ADL training;Therapeutic exercise;DME and/or AE instruction;Therapeutic activities;Balance training;Patient/family education    OT Goals(Current goals can be found in the care plan section) Acute Rehab OT Goals Patient Stated Goal: return home with family assist OT Goal Formulation: With patient Time For Goal Achievement: 01/10/15 Potential to Achieve Goals: Good ADL Goals Pt Will Perform Upper Body Bathing: with min assist;sitting Pt Will Perform Upper Body Dressing: with supervision;sitting Additional ADL Goal #1: Pt will sit EOB x 10 minutes with supervision while engaged in ADL. Additional ADL Goal #2: Pt will transition from supine to sitting at EOB with min assist in preparation for ADL. Additional ADL Goal #3: Pt will be independent in UE strengthening HEP with level 2 theraband.  OT Frequency: Min 2X/week   Barriers to D/C:            Co-evaluation              End of Session    Activity Tolerance: Patient tolerated treatment well Patient left: in bed;with call bell/phone within reach;with family/visitor present (palliative care MD)   Time: 8088-1103 OT Time Calculation (min): 27 min Charges:  OT General Charges $OT Visit: 1 Procedure OT Evaluation $Initial OT Evaluation Tier I: 1 Procedure OT Treatments $Self Care/Home Management : 8-22 mins G-Codes:    Malka So 12/27/2014, 4:11 PM  [phone]

## 2014-12-27 NOTE — Progress Notes (Signed)
Subjective:    The patient continues to feel better this morning with decreased abdominal pain and leg pain. She reports eating a good lunch and dinner yesterday without issue. She says that her family stopped by intermittently yesterday, but she is hoping they will come as a group today so they can have a goals of care discussion.  Interval Events: -Afebrile with stable vital signs.    Objective:    Vital Signs:   Temp:  [97 F (36.1 C)-98.1 F (36.7 C)] 97.8 F (36.6 C) (05/11 0733) Pulse Rate:  [65-96] 90 (05/11 0800) Resp:  [16-30] 27 (05/11 0800) BP: (117-164)/(49-90) 164/71 mmHg (05/11 0800) SpO2:  [94 %-100 %] 95 % (05/11 0800) Weight:  [194 lb 3.6 oz (88.1 kg)] 194 lb 3.6 oz (88.1 kg) (05/10 1009) Last BM Date: 12/25/14  24-hour weight change: Weight change:   Intake/Output:   Intake/Output Summary (Last 24 hours) at 12/27/14 0851 Last data filed at 12/27/14 0800  Gross per 24 hour  Intake   1416 ml  Output   1380 ml  Net     36 ml      Physical Exam: General: Well-developed, well-nourished, in no acute distress; alert, appropriate and cooperative throughout examination.  Lungs:  Normal respiratory effort. Clear to auscultation BL without crackles or wheezes.  Heart: RRR. S1 and S2 normal without gallop, murmur, or rubs.  Abdomen:  Drain in place in LLQ with serosanguinous fluid, midline surgical incision dressed, clean dry, and intact.  Colostomy bag in RLQ with brown stool.  Minimal tenderness to palpation, bowel sounds present.  Extremities: Cold, dusky R foot with no palpable pulses, R calf tender to palpation midway to the foot and higher. Trace edema in arms and legs.   Labs:  Basic Metabolic Panel:  Recent Labs Lab 12/23/14 0300 12/24/14 0315 12/24/14 1718 12/25/14 0458 12/26/14 0230 12/26/14 0820 12/27/14 0701  NA 140 140  --  139  --  140 139  K 4.2 2.8* 3.2* 2.9*  --  3.5 3.5  CL 102 102  --  103  --  106 106  CO2 26 29  --  28  --  28  27  GLUCOSE 65* 114*  --  138*  --  128* 118*  BUN 50* 42*  --  32*  --  21* 18  CREATININE 2.57* 2.09*  --  1.67*  --  1.28* 1.18*  CALCIUM 7.3* 7.1*  --  7.0*  --  7.1* 7.2*  MG  --  1.6* 2.2 2.0 1.9  --   --   PHOS  --  4.0  --  3.1  --   --   --     Liver Function Tests:  Recent Labs Lab 12/24/14 0315 12/25/14 0458  AST 69* 49*  ALT 45 34  ALKPHOS 89 86  BILITOT 0.6 0.4  PROT 4.2* 4.1*  ALBUMIN 1.1* 1.1*   CBC:  Recent Labs Lab 12/21/14 0430  12/23/14 0300 12/24/14 0315 12/25/14 0458 12/26/14 0230 12/27/14 0701  WBC 14.0*  < > 13.5* 16.7* 20.4* 23.5* 20.2*  NEUTROABS 12.2*  --   --  14.4* 17.8*  --   --   HGB 6.2*  < > 10.5* 8.8* 8.1* 7.8* 7.2*  HCT 18.7*  < > 31.9* 26.6* 25.4* 24.6* 22.9*  MCV 75.4*  < > 78.6 78.5 79.9 79.4 81.2  PLT 105*  < > 143* 227 297 345 401*  < > = values in this interval  not displayed. CBG:  Recent Labs Lab 12/26/14 1159 12/26/14 1532 12/26/14 2039 12/26/14 2320 12/27/14 0426  GLUCAP 110* 117* 113* 131* 119*    Microbiology: Results for orders placed or performed during the hospital encounter of 12/16/14  Surgical pcr screen     Status: None   Collection Time: 12/17/14  6:20 AM  Result Value Ref Range Status   MRSA, PCR NEGATIVE NEGATIVE Final   Staphylococcus aureus NEGATIVE NEGATIVE Final    Comment:        The Xpert SA Assay (FDA approved for NASAL specimens in patients over 50 years of age), is one component of a comprehensive surveillance program.  Test performance has been validated by [facility] for patients greater than or equal to 64 year old. It is not intended to diagnose infection nor to guide or monitor treatment.   Surgical pcr screen     Status: None   Collection Time: 12/17/14 11:45 AM  Result Value Ref Range Status   MRSA, PCR NEGATIVE NEGATIVE Final   Staphylococcus aureus NEGATIVE NEGATIVE Final    Comment:        The Xpert SA Assay (FDA approved for NASAL specimens in patients over 42  years of age), is one component of a comprehensive surveillance program.  Test performance has been validated by [facility] for patients greater than or equal to 33 year old. It is not intended to diagnose infection nor to guide or monitor treatment.   Culture, blood (routine x 2)     Status: None   Collection Time: 12/17/14  4:25 PM  Result Value Ref Range Status   Specimen Description BLOOD LEFT WRIST  Final   Special Requests BOTTLES DRAWN AEROBIC ONLY 1CC  Final   Culture   Final    NO GROWTH 5 DAYS Note: Culture results may be compromised due to an inadequate volume of blood received in culture bottles. Performed at [facility]    Report Status 12/23/2014 FINAL  Final  Culture, blood (routine x 2)     Status: None   Collection Time: 12/17/14  4:28 PM  Result Value Ref Range Status   Specimen Description BLOOD CENTRAL LINE  Final   Special Requests BOTTLES DRAWN AEROBIC ONLY 10CC  Final   Culture   Final    NO GROWTH 5 DAYS Performed at [facility]    Report Status 12/23/2014 FINAL  Final   Imaging: Ct Abdomen Pelvis Wo Contrast  12/25/2014   CLINICAL DATA:  Abdominal pain.  Status post exploratory laparotomy.  EXAM: CT ABDOMEN AND PELVIS WITHOUT CONTRAST  TECHNIQUE: Multidetector CT imaging of the abdomen and pelvis was performed following the standard protocol without IV contrast.  COMPARISON:  12/16/2014  FINDINGS: Lower chest: Bilateral pleural effusions are identified, left greater than right. There is compressive type consolidation involving both lower lobes.  Hepatobiliary: There is no suspicious liver abnormality. Status post cholecystectomy. The common bile duct measures 8 mm.  Pancreas: Normal appearance of the pancreas.  Spleen: Splenic granulomas are again noted.  Adrenals/Urinary Tract: The right adrenal gland appears normal. Nodule in the left adrenal gland is unchanged measuring 1.9 by 1.6 cm, image 28/series 2. The right kidney appears normal.  The left kidney is also normal. Urinary bladder contains a small amount of gas which may be related to recent instrumentation  Stomach/Bowel: Unremarkable appearance of the stomach. The small bowel loops are mildly increased in caliber. The patient has a right lower quadrant ileostomy status post  right hemicolectomy. The left colon, sigmoid colon and rectum are unremarkable.  Vascular/Lymphatic: Calcified atherosclerotic disease involves the abdominal aorta. No aneurysm. Multiple prominent retroperitoneal lymph nodes are identified, similar to the previous exam. Index left-sided retroperitoneal node measures 1.2 cm, image 39/ series 2. Similar to previous exam. 9 mm periaortic lymph node is also unchanged from previous study. No pelvic or inguinal adenopathy.  Reproductive: The uterus appears within normal limits. Under the adnexal structures are unremarkable.  Other: There is diffuse body wall edema. Multiple areas of mildly complex, intermediate attenuation fluid are identified within the abdomen and pelvis. These are difficult to characterize without IV contrast material. Focal fluid collection within the cul-de-sac may represent loculated ascites. This measures 5.6 x 3.9 cm. Within the right lower quadrant of the abdomen there is a fluid collection which measures 6.7 x3.8 cm, image 25/series 5. Within the left upper quadrant of the abdomen fluid collection measures 3.6 x 3.8 cm. Area of possible loculated fluid in the left upper quadrant of the abdomen measures 5 cm, image 23/series 2. Peritoneal nodularity is again identified compatible with peritoneal metastasis. Open ventral abdominal wall wound is again noted.  Musculoskeletal: Spondylosis noted within the lower thoracic and lower lumbar spine. No aggressive lytic or sclerotic bone lesions identified.  IMPRESSION: 1. Postoperative changes compatible with right hemicolectomy and right lower quadrant ileostomy. 2. Diffuse body wall edema, bilateral pleural  effusions are identified consistent with fluid overload state. 3. Multiple areas of loculated fluid are identified within the abdomen or pelvis. Evaluation is limited due to lack of IV contrast material. Findings may represent loculated ascites secondary to peritoneal metastasis. Multiple abdominal and pelvic abscess ease cannot be excluded. Finally in this patient who is on heparin intra-abdominal hemorrhage/hematomas cannot be excluded. 4. Diffuse peritoneal nodularity compatible with peritoneal metastasis.   Electronically Signed   By: Kerby Moors M.D.   On: 12/25/2014 21:36       Medications:    Infusions: . sodium chloride Stopped (12/23/14 1800)  . dextrose 5 % and 0.9% NaCl 75 mL/hr at 12/26/14 2237  . heparin 1,700 Units/hr (12/26/14 2342)    Scheduled Medications: . antiseptic oral rinse  7 mL Mouth Rinse q12n4p  . aspirin  81 mg Oral Daily  . [START ON 12/28/2014] cefUROXime (ZINACEF)  IV  1.5 g Intravenous On Call to OR  . chlorhexidine  15 mL Mouth Rinse BID  . FLUoxetine  20 mg Oral Daily  . insulin aspart  0-9 Units Subcutaneous 6 times per day    PRN Medications: albuterol, fentaNYL (SUBLIMAZE) injection, hydrALAZINE, oxyCODONE-acetaminophen   Assessment/ Plan:    Principal Problem:   Septic shock Active Problems:   Bowel obstruction   Colonic mass   S/P partial colectomy   Severe sepsis with acute organ dysfunction   Noncompliance with medication regimen   Acute respiratory failure with hypoxia   Thrombus   Acute renal failure syndrome   Peritonitis   Colon cancer metastasized to multiple sites   Acute blood loss anemia   Melena   Diabetes type 2, controlled   Hypokalemia   Hypomagnesemia   Depression  #[id number] (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 She is tolerating a  diet well, but goals of care remain chief priority. -Palliative care has been consulted. -Patient will need outpatient follow-up with oncology. -Card modified  diet. -Continue D5 normal saline at 75 mL per hour given resolving AKI. -Continue Percocet 5-325, 1-2 tabs every 4 hours as needed. -Continue fentanyl 12.5-25 g IV every 2 hours needed. -Continue work with PT. -Consulted OT and CIR.  #Acute ischemic right foot secondary to thrombus from atrial fibrillation Pain in foot has improved and the area of ischemia has been well demarcated. WBC count trending down. -Continue heparin per pharmacy consult and aspirin 81 mg daily. -Scheduled for right BKA tomorrow at 11:15. -We'll need to hold heparin 4-5 hours before surgery. -Indefinite anticoagulation with Lovenox given malignancy will need to be resumed postsurgery. -Nothing by mouth after midnight on Thursday along with cefuroxime in preparation for surgery. -Continue to monitor on telemetry.  #Septic shock secondary to GI perforation White blood cell count now trending down. Unclear if loculated fluid in abdomen is abscess versus hemorrhage versus peritoneal fluid. -Percutaneous drain If general surgery recommends, could be performed at same time as amputation tomorrow. -No additional antibiotics for now.  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  #Acute blood loss anemia Hemoglobin continues to trend down to 7.2 today. Possibly, slow oozing into her peritoneal cavity due to heparin. -We'll transfuse 2 units of RBCs today given plan for surgery tomorrow.  #Hypertension Stable. -Continue hydralazine 10 mg when necessary for SBP greater than 160.  #Acute renal failure Creatinine continues to improve to 1.18 today. -Continue IV fluids as above for today.  #Type 2 diabetes Good blood sugar control. -Continue CBGs and sliding scale insulin sensitive ACHS.  #Depression -Continue Prozac 20 mg  daily.   DVT PPX - heparin  CODE STATUS - DNR  CONSULTS PLACED - PCCM, Oncology, General Surgery, Vascular Surgery, GI.  DISPO - Disposition is deferred at this time, awaiting improvement of ischemic foot.   Anticipated discharge in approximately 5-10 day(s).   The patient does not have a current PCP (No PCP Per Patient) and does need an [facility] hospital follow-up appointment after discharge.    Is the [facility] hospital follow-up appointment a one-time only appointment? yes.  Does the patient have transportation limitations that hinder transportation to clinic appointments? yes   SERVICE NEEDED AT [facility]         Y = Yes, Blank = No PT: CIR.  OT:   RN:   Equipment:   Other:      Length of Stay: 11 day(s)   Signed: Charlesetta Shanks, MD  PGY-1, Internal Medicine Resident Pager: [phone] (7AM-5PM) 12/27/2014, 8:51 AM

## 2014-12-27 NOTE — Clinical Documentation Improvement (Signed)
Supporting Information: Patient is cachectic per 4/30 progress notes.  Inadequate oral intake per 5/06 Registered Dietician's evaluation. Diffuse body wall edema secondary poor nutrition per 5/10 progress notes.   Possible Clinical Condition: . Severity: --Mild Malnutrition (first degree) --Moderate Malnutrition (second degree) --Severe Malnutrition (third degree) . Avoid documenting a range of severity, such as "moderate to severe" . Form: --Kwashi[place]or (rarely seen in the [place] --Marasmus --Marasmic kwashiorkor --Other . Document any associated diagnoses/conditions    Thank Sherian Maroon Documentation Specialist [phone] [email or web address]

## 2014-12-27 NOTE — Progress Notes (Signed)
[facility] Progress Note  10 Days Post-Op  Subjective: Pt mostly c/o pain in her right foot.  No significant abdominal pain or N/V, tolerating diet well.  Having flatus and BM in bag (bag is 3/4 full of stool/flatus).  Working with therapies.  Pending OR tomorrow for Right BKA.    Objective: Vital signs in last 24 hours: Temp:  [97 F (36.1 C)-98.1 F (36.7 C)] 97.8 F (36.6 C) (05/11 0733) Pulse Rate:  [65-96] 73 (05/11 0733) Resp:  [16-30] 29 (05/11 0733) BP: (117-157)/(49-90) 141/61 mmHg (05/11 0733) SpO2:  [94 %-100 %] 95 % (05/11 0733) Weight:  [88.1 kg (194 lb 3.6 oz)] 88.1 kg (194 lb 3.6 oz) (05/10 1009) Last BM Date: 12/25/14  Intake/Output from previous day: 05/10 0701 - 05/11 0700 In: 7371 [P.O.:220; I.V.:1288] Out: 1180 [Urine:1150; Stool:30] Intake/Output this shift:    PE: Gen:  Alert, NAD, pleasant Card:  RRR, no M/G/R heard Pulm:  CTA, no W/R/R Abd: Soft, NT/ND, +BS, no HSM, midline wound clean with some slough, drain with sanguinous drainage (none recorded on I&O) Ext:  Ischemic right foot/ankle  Lab Results:   Recent Labs  12/26/14 0230 12/27/14 0701  WBC 23.5* 20.2*  HGB 7.8* 7.2*  HCT 24.6* 22.9*  PLT 345 401*   BMET  Recent Labs  12/26/14 0820 12/27/14 0701  NA 140 139  K 3.5 3.5  CL 106 106  CO2 28 27  GLUCOSE 128* 118*  BUN 21* 18  CREATININE 1.28* 1.18*  CALCIUM 7.1* 7.2*   PT/INR No results for input(s): LABPROT, INR in the last 72 hours. CMP     Component Value Date/Time   NA 139 12/27/2014 0701   K 3.5 12/27/2014 0701   CL 106 12/27/2014 0701   CO2 27 12/27/2014 0701   GLUCOSE 118* 12/27/2014 0701   BUN 18 12/27/2014 0701   CREATININE 1.18* 12/27/2014 0701   CALCIUM 7.2* 12/27/2014 0701   PROT 4.1* 12/25/2014 0458   ALBUMIN 1.1* 12/25/2014 0458   AST 49* 12/25/2014 0458   ALT 34 12/25/2014 0458   ALKPHOS 86 12/25/2014 0458   BILITOT 0.4 12/25/2014 0458   GFRNONAA 45* 12/27/2014 0701   GFRAA 52*  12/27/2014 0701   Lipase     Component Value Date/Time   LIPASE 15 12/16/2014 1004       Studies/Results: Ct Abdomen Pelvis Wo Contrast  12/25/2014   CLINICAL DATA:  Abdominal pain.  Status post exploratory laparotomy.  EXAM: CT ABDOMEN AND PELVIS WITHOUT CONTRAST  TECHNIQUE: Multidetector CT imaging of the abdomen and pelvis was performed following the standard protocol without IV contrast.  COMPARISON:  12/16/2014  FINDINGS: Lower chest: Bilateral pleural effusions are identified, left greater than right. There is compressive type consolidation involving both lower lobes.  Hepatobiliary: There is no suspicious liver abnormality. Status post cholecystectomy. The common bile duct measures 8 mm.  Pancreas: Normal appearance of the pancreas.  Spleen: Splenic granulomas are again noted.  Adrenals/Urinary Tract: The right adrenal gland appears normal. Nodule in the left adrenal gland is unchanged measuring 1.9 by 1.6 cm, image 28/series 2. The right kidney appears normal. The left kidney is also normal. Urinary bladder contains a small amount of gas which may be related to recent instrumentation  Stomach/Bowel: Unremarkable appearance of the stomach. The small bowel loops are mildly increased in caliber. The patient has a right lower quadrant ileostomy status post right hemicolectomy. The left colon, sigmoid colon and rectum are unremarkable.  Vascular/Lymphatic: Calcified  atherosclerotic disease involves the abdominal aorta. No aneurysm. Multiple prominent retroperitoneal lymph nodes are identified, similar to the previous exam. Index left-sided retroperitoneal node measures 1.2 cm, image 39/ series 2. Similar to previous exam. 9 mm periaortic lymph node is also unchanged from previous study. No pelvic or inguinal adenopathy.  Reproductive: The uterus appears within normal limits. Under the adnexal structures are unremarkable.  Other: There is diffuse body wall edema. Multiple areas of mildly complex,  intermediate attenuation fluid are identified within the abdomen and pelvis. These are difficult to characterize without IV contrast material. Focal fluid collection within the cul-de-sac may represent loculated ascites. This measures 5.6 x 3.9 cm. Within the right lower quadrant of the abdomen there is a fluid collection which measures 6.7 x3.8 cm, image 25/series 5. Within the left upper quadrant of the abdomen fluid collection measures 3.6 x 3.8 cm. Area of possible loculated fluid in the left upper quadrant of the abdomen measures 5 cm, image 23/series 2. Peritoneal nodularity is again identified compatible with peritoneal metastasis. Open ventral abdominal wall wound is again noted.  Musculoskeletal: Spondylosis noted within the lower thoracic and lower lumbar spine. No aggressive lytic or sclerotic bone lesions identified.  IMPRESSION: 1. Postoperative changes compatible with right hemicolectomy and right lower quadrant ileostomy. 2. Diffuse body wall edema, bilateral pleural effusions are identified consistent with fluid overload state. 3. Multiple areas of loculated fluid are identified within the abdomen or pelvis. Evaluation is limited due to lack of IV contrast material. Findings may represent loculated ascites secondary to peritoneal metastasis. Multiple abdominal and pelvic abscess ease cannot be excluded. Finally in this patient who is on heparin intra-abdominal hemorrhage/hematomas cannot be excluded. 4. Diffuse peritoneal nodularity compatible with peritoneal metastasis.   Electronically Signed   By: Kerby Moors M.D.   On: 12/25/2014 21:36    Anti-infectives: Anti-infectives    Start     Dose/Rate Route Frequency Ordered Stop   12/28/14 0800  cefUROXime (ZINACEF) 1.5 g in dextrose 5 % 50 mL IVPB     1.5 g 100 mL/hr over 30 Minutes Intravenous On call to O.R. 12/25/14 1324 12/29/14 0559   12/22/14 1000  piperacillin-tazobactam (ZOSYN) IVPB 2.25 g     2.25 g 100 mL/hr over 30 Minutes  Intravenous Every 6 hours 12/22/14 0949 12/23/14 2230   12/19/14 1600  fluconazole (DIFLUCAN) IVPB 200 mg     200 mg 100 mL/hr over 60 Minutes Intravenous Every 24 hours 12/19/14 0901 12/23/14 1600   12/18/14 1100  fluconazole (DIFLUCAN) IVPB 400 mg  Status:  Discontinued     400 mg 100 mL/hr over 120 Minutes Intravenous Every 24 hours 12/18/14 1036 12/19/14 0901   12/17/14 1100  piperacillin-tazobactam (ZOSYN) IVPB 3.375 g  Status:  Discontinued     3.375 g 12.5 mL/hr over 240 Minutes Intravenous Every 8 hours 12/17/14 0956 12/22/14 0911   12/17/14 0600  cefoTEtan (CEFOTAN) 2 g in dextrose 5 % 50 mL IVPB     2 g 100 mL/hr over 30 Minutes Intravenous To Surgery 12/17/14 0542 12/17/14 0726       Assessment/Plan Perforation of cecum, large obstructing tumor at the splenic flexure POD#10 exploratory laparotomy right, transverse, partial 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 Grandville Silos -WOC following  -BID wet to dry dressing changes -Drain care  -Path-adeno with mets. Hem/onc consulted -On carb mod diet ID-zosyn since 5/1---> fluconazole 5/2 WBC up, afebrile.  Ischemic right foot - Vascular is planning OR tomorrow for Right  BKA Leukocytosis - 20.2 VTE prophylaxis-SCD/heparin, mobilize FEN-On carb mod diet Disp - PT recommending CIR after BKA    LOS: 11 days    Kathryn Knapp, Kathryn Knapp 12/27/2014, 8:15 AM Pager: [phone]

## 2014-12-27 NOTE — Progress Notes (Signed)
  Date: 12/27/2014  Patient name: Kathryn Knapp  Medical record number: [id number]  Date of birth: 1941-03-10   This patient has been seen and the plan of care was discussed with the house staff. Please see their note for complete details. I concur with their findings with the following additions/corrections: [name] cont to be in great spirits with a positive outlook despite her current medical issues. Pain in mid calf, none in foot. Eating well. Exercising upper ext with bands on bed and working with PT. For R BKA tomorrow and palliative care consult for [place] with family. Agree with Dr Trudee Kuster to transfuse 2 units PRBC due to slowing decreasing HgB and surgery in AM.   Bartholomew Crews, MD 12/27/2014, 11:12 AM

## 2014-12-27 NOTE — Consult Note (Signed)
ANTICOAGULATION CONSULT NOTE Pharmacy Consult for Heparin Indication: RLE ischemia  No Known Allergies  Patient Measurements: Height: 5\' 11"  (180.3 cm) Weight: 194 lb 3.6 oz (88.1 kg) IBW/kg (Calculated) : 70.8 Heparin Dosing Weight: ~83kg  Vital Signs: Temp: 97.8 F (36.6 C) (05/11 0733) Temp Source: Oral (05/11 0733) BP: 164/71 mmHg (05/11 0800) Pulse Rate: 90 (05/11 0800)  Labs:  Recent Labs  12/25/14 0458  12/26/14 0230 12/26/14 0820 12/26/14 1150 12/27/14 0701  HGB 8.1*  --  7.8*  --   --  7.2*  HCT 25.4*  --  24.6*  --   --  22.9*  PLT 297  --  345  --   --  401*  HEPARINUNFRC  --   < > 0.16*  --  0.49 0.39  CREATININE 1.67*  --   --  1.28*  --  1.18*  < > = values in this interval not displayed.  Estimated Creatinine Clearance: 52.1 mL/min (by C-G formula based on Cr of 1.18).   Medical History: Past Medical History  Diagnosis Date  . Diabetes mellitus without complication   . Hypertension     Assessment: On heparin gtt for R ischemic foot likely secondary to thrombus from AFib. HL remains therapeutic on heparin 1700 units/hr.  No issues with infusion or bleeding. Drop in hgb, 10.5 > 8.1 > 7.2, plts wnl.  Anticipate R BKA tomorrow.   Goal of Therapy:  Heparin level 0.3-0.7 units/ml Monitor platelets by anticoagulation protocol: Yes    Plan:  Continue heparin at 1700 units/hr Daily HL, CBC Monitor s/sx of bleeding F/u anticoagulation after BKA    Hughes Better, PharmD, BCPS Clinical Pharmacist Pager: [phone] 12/27/2014 11:48 AM

## 2014-12-27 NOTE — Progress Notes (Signed)
Nutrition Follow-up  DOCUMENTATION CODES:  Not applicable  INTERVENTION:  Ensure Enlive po BID, each supplement provides 350 kcal and 20 grams of protein  NUTRITION DIAGNOSIS:  Inadequate oral intake related to inability to eat as evidenced by NPO status, ongoing  GOAL:  Patient will meet greater than or equal to 90% of their needs, progressing  MONITOR:  Diet advancement, PO intake, Supplement acceptance, Labs, Weight trends, I & O's  ASSESSMENT: 74 year old with a remote history of HTN and diabetes who presents with a two-week history of lower abdominal pain. CT abdomen revealed large bowel obstruction and colonic mass.  Patient s/p procedure 5/1: EXPLORATORY LAPAROTOMY LEFT COLECTOMY PARTIAL GASTRECTOMY END ILEOSTOMY  Patient transferred to 2C-Stepdown 5/10.    Currently on a Carbohydrate Modified diet.  States appetite ok.  PO intake poor at 10% per flowsheet records.  Reports she had one (1) Ensure Enlive supplement.  RD to order.  Plan is for BKA tomorrow.  Height:  Ht Readings from Last 1 Encounters:  12/26/14 5\' 11"  (1.803 m)    Weight:  Wt Readings from Last 1 Encounters:  12/26/14 194 lb 3.6 oz (88.1 kg)    Ideal Body Weight:  66 kg  Wt Readings from Last 10 Encounters:  12/26/14 194 lb 3.6 oz (88.1 kg)  04/14/13 198 lb (89.812 kg)  09/21/12 192 lb (87.091 kg)    BMI:  Body mass index is 27.1 kg/(m^2).  Re-estimated Nutritional Needs:  Kcal:  2000-2200  Protein:  115-125 gm  Fluid:  per MD  Skin:  Wound (see comment) (DTI to sacrum)  Diet Order:  NPO  EDUCATION NEEDS:  No education needs identified at this time   Intake/Output Summary (Last 24 hours) at 12/27/14 1443 Last data filed at 12/27/14 1232  Gross per 24 hour  Intake   1232 ml  Output   1745 ml  Net   -513 ml    Last BM:  unknown  Arthur Holms, RD, LDN Pager #: [phone] After-Hours Pager #: [phone]

## 2014-12-27 NOTE — Consult Note (Signed)
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    1.  Code Status: DNR documented  2. GOC: Was supposed to meet with daughters at [facility] today. Reportedly grandchildren sick and they are not able to make it in.  I spoke for a while with Kathryn Knapp today. She knows she has residual cancer and aware that this is life limiting illness for her.  She has some uncertainty about how things will go in future and plans on Oncology follow-up as outpatient.  Her plan is to go to OR for amputation of ischemic limb and then her hope is for CIR.  She does not seem to have great insight into what to expect regarding her cancer and options going forwardI think further decisions on cancer directed therapy vs comfort care will depend on how she does post-op and discussions with Oncology.  She really does not have questions about this today and her focus is on surgery tomorrow. She feels like her family is well informed on current information regarding her disease and life limiting illness.  I am off service after today.  I will make our team aware of patient. Please call for more urgent needs.     3. Symptom Management:   1. Abd Pain/Foot pain- controlled with PRN's.  Will have to see how she does post-op. Describes neuropathic component and could target with gabapentin and/or SNRI if persists post-op.    4. Psychosocial/Spiritual: 2 daughters. Lives at home with her 75 young grandchildren.  Her son died almost 32 years ago. She has struggles with her faith over this. For years did not believe in a god and recently has explored her spirituality more.  She has been talking with Estate agent.     Brief HPI: 74 yo female with PMHx of DM, HTN admitted on 4/30 with abdominal pain.  Found to have obstructing colonic mass and was taken to OR on 5/1 for partial colectomy/gastrectomy and end ileostomy.  Pathology reports confirm colon CA (adenocarcinoma).  Noted to have stage IV disease with peritoneal carcinomatosis.  Post-op course complicated by resp failure requiring intubation, AKI, afib w/rvr, and right foot ischemia/necrosis.  Plan is to take her to OR tomorrow for BKA of RLE.  Today she states that abdominal pain, right lower ext pain better controlled.  She describes burning sensation in right foot at times, other times sharp. ileosotomy working.  Not very anxious.  Sleeping well. Denis SOB, N/V.  Wishes for palliative care team to speak with her family. She alludes to some strained relationships in past with family which she wants to correct.  Feels like her family is scared, she feels less so.      PMH:  Past Medical History  Diagnosis Date  . Diabetes mellitus without complication   . Hypertension      PSH: Past Surgical History  Procedure Laterality Date  . Cholecystectomy    . Abdominal hysterectomy    . Laparotomy N/A 12/17/2014    Procedure: EXPLORATORY LAPAROTOMY Right, Transverse, partial left colectomy with en bloc partial gastrectomy illeostomy;  Surgeon: Kathryn Skeans, MD;  Location: [facility] OR;  Service: General;  Laterality: N/A;   I have reviewed the [place] and [facility] and  If appropriate update it with new information. No Known Allergies Scheduled Meds: . antiseptic oral rinse  7 mL Mouth Rinse q12n4p  . aspirin  81 mg Oral Daily  . [START ON 12/28/2014] cefUROXime (ZINACEF)  IV  1.5 g Intravenous On Call to OR  . chlorhexidine  15 mL Mouth Rinse BID  . FLUoxetine  20 mg Oral Daily  . insulin aspart  0-9 Units Subcutaneous 6 times per day   Continuous Infusions: . sodium chloride Stopped (12/23/14 1800)  . dextrose 5 % and 0.9% NaCl 75 mL/hr at 12/26/14 2237   . heparin 1,700 Units/hr (12/26/14 2342)   PRN Meds:.albuterol, fentaNYL (SUBLIMAZE) injection, hydrALAZINE, oxyCODONE-acetaminophen    BP 164/71 mmHg  Pulse 90  Temp(Src) 97.8 F (36.6 C) (Oral)  Resp 27  Ht 5\' 11"  (1.803 m)  Wt 88.1 kg (194 lb 3.6 oz)  BMI 27.10 kg/m2  SpO2 95%   PPS: 40   Intake/Output Summary (Last 24 hours) at 12/27/14 1049 Last data filed at 12/27/14 1031  Gross per 24 hour  Intake   1232 ml  Output   1795 ml  Net   -563 ml    Physical Exam:  General: Alert, NAD HEENT:  [place], sclera anicteric Neck: supple Chest:   CTAB CVS: RRR Abdomen: soft, +colosotmy, +BS Ext:  Cold right lower ext  Labs: CBC    Component Value Date/Time   WBC 20.2* 12/27/2014 0701   RBC 2.82* 12/27/2014 0701   RBC 4.00 12/16/2014 1632   HGB 7.2* 12/27/2014 0701   HCT 22.9* 12/27/2014 0701   PLT 401* 12/27/2014 0701   MCV 81.2 12/27/2014 0701   MCH 25.5* 12/27/2014 0701   MCHC 31.4 12/27/2014 0701   RDW 18.0* 12/27/2014 0701   LYMPHSABS 1.4 12/25/2014 0458   MONOABS 1.0 12/25/2014 0458   EOSABS 0.2 12/25/2014 0458   BASOSABS 0.0 12/25/2014 0458    BMET    Component Value Date/Time   NA 139 12/27/2014 0701   K 3.5 12/27/2014 0701   CL 106 12/27/2014 0701   CO2 27 12/27/2014 0701   GLUCOSE 118* 12/27/2014 0701   BUN 18 12/27/2014 0701   CREATININE 1.18* 12/27/2014 0701   CALCIUM 7.2* 12/27/2014 0701   GFRNONAA 45* 12/27/2014 0701   GFRAA 52* 12/27/2014 0701    CMP     Component Value Date/Time   NA 139 12/27/2014 0701   K 3.5 12/27/2014 0701   CL 106 12/27/2014 0701   CO2 27 12/27/2014 0701   GLUCOSE 118* 12/27/2014 0701   BUN 18 12/27/2014 0701   CREATININE 1.18* 12/27/2014 0701   CALCIUM 7.2* 12/27/2014 0701   PROT 4.1* 12/25/2014 0458   ALBUMIN 1.1* 12/25/2014 0458   AST 49* 12/25/2014 0458   ALT 34 12/25/2014 0458   ALKPHOS 86 12/25/2014 0458   BILITOT 0.4 12/25/2014 0458   GFRNONAA 45* 12/27/2014 0701   GFRAA 52* 12/27/2014 0701    5/5 CXR IMPRESSION: Support equipment appears satisfactorily positioned.  Unchanged left base consolidation and effusion.  5/9 CT ABd/Pelvis IMPRESSION: 1. Postoperative changes compatible with right hemicolectomy and right lower quadrant ileostomy. 2. Diffuse body wall edema, bilateral pleural effusions are identified consistent with fluid overload state. 3. Multiple areas of loculated fluid are identified within the abdomen or pelvis. Evaluation is limited due to lack of IV contrast material. Findings may represent loculated ascites secondary  to peritoneal metastasis. Multiple abdominal and pelvic abscess ease cannot be excluded. Finally in this patient who is on heparin intra-abdominal hemorrhage/hematomas cannot be excluded. 4. Diffuse peritoneal nodularity compatible with peritoneal metastasis.  Total Time: 50 minutes Greater than 50%  of this time was spent counseling and coordinating care related to the above assessment and plan.  Doran Clay D.O. Palliative Medicine Team at [facility]  Pager: [phone] Team Phone: [phone]

## 2014-12-27 NOTE — Progress Notes (Signed)
   Preoperative Note   Procedure: R BKA  Date: 12/28/14  Preoperative diagnosis: Right foot ischemia  Consent: to be obtained  Laboratory:  CBC:    Component Value Date/Time   WBC 20.2* 12/27/2014 0701   RBC 2.82* 12/27/2014 0701   RBC 4.00 12/16/2014 1632   HGB 7.2* 12/27/2014 0701   HCT 22.9* 12/27/2014 0701   PLT 401* 12/27/2014 0701   MCV 81.2 12/27/2014 0701   MCH 25.5* 12/27/2014 0701   MCHC 31.4 12/27/2014 0701   RDW 18.0* 12/27/2014 0701   LYMPHSABS 1.4 12/25/2014 0458   MONOABS 1.0 12/25/2014 0458   EOSABS 0.2 12/25/2014 0458   BASOSABS 0.0 12/25/2014 0458    BMP:    Component Value Date/Time   NA 139 12/27/2014 0701   K 3.5 12/27/2014 0701   CL 106 12/27/2014 0701   CO2 27 12/27/2014 0701   GLUCOSE 118* 12/27/2014 0701   BUN 18 12/27/2014 0701   CREATININE 1.18* 12/27/2014 0701   CALCIUM 7.2* 12/27/2014 0701   GFRNONAA 45* 12/27/2014 0701   GFRAA 52* 12/27/2014 0701    Coagulation: Lab Results  Component Value Date   INR 1.19 12/16/2014   No results found for: PTT   Adele Barthel, MD [facility] Office: [phone] Pager: [phone]  12/27/2014, 8:27 AM

## 2014-12-27 NOTE — Progress Notes (Signed)
Physical medicine rehabilitation consult requested chart reviewed. Patient presently multi-medical metastatic colon cancer now with ischemic changes lower extremities with a plan for right below-knee amputation for Thursday. Will hold on formal rehabilitation consult at this time please reconsult once medically stable.

## 2014-12-28 ENCOUNTER — Encounter (HOSPITAL_COMMUNITY)
Admission: EM | Disposition: A | Discharge status: Rehabilitation Facility | Payer: Self-pay | Source: Home / Self Care | Attending: Internal Medicine

## 2014-12-28 ENCOUNTER — Inpatient Hospital Stay (HOSPITAL_COMMUNITY): Payer: Medicare Other | Admitting: Certified Registered Nurse Anesthetist

## 2014-12-28 ENCOUNTER — Encounter (HOSPITAL_COMMUNITY): Payer: Self-pay | Admitting: Certified Registered Nurse Anesthetist

## 2014-12-28 DIAGNOSIS — S88111S Complete traumatic amputation at level between knee and ankle, right lower leg, sequela: Secondary | ICD-10-CM

## 2014-12-28 DIAGNOSIS — R5381 Other malaise: Secondary | ICD-10-CM

## 2014-12-28 DIAGNOSIS — I70261 Atherosclerosis of native arteries of extremities with gangrene, right leg: Secondary | ICD-10-CM

## 2014-12-28 DIAGNOSIS — Z89511 Acquired absence of right leg below knee: Secondary | ICD-10-CM

## 2014-12-28 HISTORY — PX: AMPUTATION: SHX166

## 2014-12-28 LAB — BASIC METABOLIC PANEL
ANION GAP: 6 (ref 5–15)
BUN: 14 mg/dL (ref 6–20)
CALCIUM: 7.2 mg/dL — AB (ref 8.9–10.3)
CO2: 27 mmol/L (ref 22–32)
CREATININE: 1.06 mg/dL — AB (ref 0.44–1.00)
Chloride: 106 mmol/L (ref 101–111)
GFR calc non Af Amer: 51 mL/min — ABNORMAL LOW (ref >60)
GFR, EST AFRICAN AMERICAN: 59 mL/min — AB (ref >60)
Glucose, Bld: 114 mg/dL — ABNORMAL HIGH (ref 65–99)
Potassium: 3.3 mmol/L — ABNORMAL LOW (ref 3.5–5.1)
Sodium: 139 mmol/L (ref 135–145)

## 2014-12-28 LAB — TYPE AND SCREEN
ABO/RH(D): A POS
Antibody Screen: NEGATIVE
UNIT DIVISION: 0
Unit division: 0

## 2014-12-28 LAB — GLUCOSE, CAPILLARY
GLUCOSE-CAPILLARY: 122 mg/dL — AB (ref 65–99)
GLUCOSE-CAPILLARY: 110 mg/dL — AB (ref 65–99)
GLUCOSE-CAPILLARY: 105 mg/dL — AB (ref 65–99)
GLUCOSE-CAPILLARY: 117 mg/dL — AB (ref 65–99)
Glucose-Capillary: 110 mg/dL — ABNORMAL HIGH (ref 65–99)
Glucose-Capillary: 111 mg/dL — ABNORMAL HIGH (ref 65–99)
Glucose-Capillary: 131 mg/dL — ABNORMAL HIGH (ref 65–99)

## 2014-12-28 LAB — HEPARIN LEVEL (UNFRACTIONATED): Heparin Unfractionated: 0.1 IU/mL — ABNORMAL LOW (ref 0.30–0.70)

## 2014-12-28 LAB — CBC
HCT: 29.7 % — ABNORMAL LOW (ref 36.0–46.0)
HCT: 30.5 % — ABNORMAL LOW (ref 36.0–46.0)
Hemoglobin: 9.5 g/dL — ABNORMAL LOW (ref 12.0–15.0)
Hemoglobin: 9.6 g/dL — ABNORMAL LOW (ref 12.0–15.0)
MCH: 26.2 pg (ref 26.0–34.0)
MCH: 25.8 pg — AB (ref 26.0–34.0)
MCHC: 32 g/dL (ref 30.0–36.0)
MCHC: 31.5 g/dL (ref 30.0–36.0)
MCV: 81.8 fL (ref 78.0–100.0)
MCV: 82 fL (ref 78.0–100.0)
PLATELETS: 391 10*3/uL (ref 150–400)
PLATELETS: 406 10*3/uL — AB (ref 150–400)
RBC: 3.63 MIL/uL — AB (ref 3.87–5.11)
RBC: 3.72 MIL/uL — ABNORMAL LOW (ref 3.87–5.11)
RDW: 16.8 % — AB (ref 11.5–15.5)
RDW: 17 % — ABNORMAL HIGH (ref 11.5–15.5)
WBC: 21.2 10*3/uL — ABNORMAL HIGH (ref 4.0–10.5)
WBC: 22.5 10*3/uL — ABNORMAL HIGH (ref 4.0–10.5)

## 2014-12-28 LAB — PROTIME-INR
INR: 1.21 (ref 0.00–1.49)
PROTHROMBIN TIME: 15.4 s — AB (ref 11.6–15.2)

## 2014-12-28 SURGERY — AMPUTATION BELOW KNEE
Anesthesia: General | Laterality: Right

## 2014-12-28 MED ORDER — PROPOFOL 10 MG/ML IV BOLUS
INTRAVENOUS | Status: AC
  Filled 2014-12-28: qty 20

## 2014-12-28 MED ORDER — DOCUSATE SODIUM 100 MG PO CAPS
100.0000 mg | ORAL_CAPSULE | Freq: Every day | ORAL | Status: DC
  Administered 2014-12-29 – 2015-01-01 (×4): 100 mg via ORAL
  Filled 2014-12-28 (×4): qty 1

## 2014-12-28 MED ORDER — LACTATED RINGERS IV SOLN
INTRAVENOUS | Status: DC
  Administered 2014-12-28 (×3): via INTRAVENOUS

## 2014-12-28 MED ORDER — POTASSIUM CHLORIDE CRYS ER 20 MEQ PO TBCR: 20.0000 meq | EXTENDED_RELEASE_TABLET | Freq: Every day | ORAL | Status: DC | PRN

## 2014-12-28 MED ORDER — METOPROLOL TARTRATE 1 MG/ML IV SOLN
2.0000 mg | INTRAVENOUS | Status: DC | PRN
  Filled 2014-12-28: qty 5

## 2014-12-28 MED ORDER — ACETAMINOPHEN 650 MG RE SUPP: 325.0000 mg | RECTAL | Status: DC | PRN

## 2014-12-28 MED ORDER — ENOXAPARIN SODIUM 30 MG/0.3ML ~~LOC~~ SOLN
30.0000 mg | SUBCUTANEOUS | Status: DC
  Administered 2014-12-29: 30 mg via SUBCUTANEOUS
  Filled 2014-12-28 (×2): qty 0.3

## 2014-12-28 MED ORDER — ONDANSETRON HCL 4 MG/2ML IJ SOLN
INTRAMUSCULAR | Status: DC | PRN
  Administered 2014-12-28: 4 mg via INTRAVENOUS

## 2014-12-28 MED ORDER — ACETAMINOPHEN 325 MG PO TABS: 325.0000 mg | ORAL_TABLET | ORAL | Status: DC | PRN

## 2014-12-28 MED ORDER — MORPHINE SULFATE 2 MG/ML IJ SOLN
2.0000 mg | Freq: Once | INTRAMUSCULAR | Status: AC
  Administered 2014-12-28: 2 mg via INTRAVENOUS
  Filled 2014-12-28: qty 1

## 2014-12-28 MED ORDER — PHENYLEPHRINE HCL 10 MG/ML IJ SOLN
INTRAMUSCULAR | Status: DC | PRN
  Administered 2014-12-28 (×3): 80 ug via INTRAVENOUS

## 2014-12-28 MED ORDER — FENTANYL CITRATE (PF) 100 MCG/2ML IJ SOLN
INTRAMUSCULAR | Status: DC | PRN
  Administered 2014-12-28 (×2): 50 ug via INTRAVENOUS

## 2014-12-28 MED ORDER — FENTANYL CITRATE (PF) 100 MCG/2ML IJ SOLN
INTRAMUSCULAR | Status: AC
  Filled 2014-12-28: qty 2

## 2014-12-28 MED ORDER — ALUM & MAG HYDROXIDE-SIMETH 200-200-20 MG/5ML PO SUSP: 15.0000 mL | ORAL | Status: DC | PRN

## 2014-12-28 MED ORDER — ONDANSETRON HCL 4 MG/2ML IJ SOLN: 4.0000 mg | Freq: Once | INTRAMUSCULAR | Status: DC | PRN

## 2014-12-28 MED ORDER — SUCCINYLCHOLINE CHLORIDE 20 MG/ML IJ SOLN
INTRAMUSCULAR | Status: DC | PRN
  Administered 2014-12-28: 100 mg via INTRAVENOUS

## 2014-12-28 MED ORDER — GUAIFENESIN-DM 100-10 MG/5ML PO SYRP
15.0000 mL | ORAL_SOLUTION | ORAL | Status: DC | PRN
  Filled 2014-12-28: qty 15

## 2014-12-28 MED ORDER — LIDOCAINE HCL (CARDIAC) 20 MG/ML IV SOLN
INTRAVENOUS | Status: DC | PRN
  Administered 2014-12-28: 20 mg via INTRAVENOUS

## 2014-12-28 MED ORDER — PROPOFOL 10 MG/ML IV BOLUS
INTRAVENOUS | Status: DC | PRN
  Administered 2014-12-28: 120 mg via INTRAVENOUS
  Administered 2014-12-28: 30 mg via INTRAVENOUS

## 2014-12-28 MED ORDER — FENTANYL CITRATE (PF) 250 MCG/5ML IJ SOLN
INTRAMUSCULAR | Status: AC
  Filled 2014-12-28: qty 5

## 2014-12-28 MED ORDER — PANTOPRAZOLE SODIUM 40 MG PO TBEC
40.0000 mg | DELAYED_RELEASE_TABLET | Freq: Every day | ORAL | Status: DC
  Administered 2014-12-28 – 2015-01-01 (×5): 40 mg via ORAL
  Filled 2014-12-28 (×4): qty 1

## 2014-12-28 MED ORDER — FENTANYL CITRATE (PF) 100 MCG/2ML IJ SOLN
25.0000 ug | INTRAMUSCULAR | Status: DC | PRN
  Administered 2014-12-28 (×4): 25 ug via INTRAVENOUS

## 2014-12-28 MED ORDER — 0.9 % SODIUM CHLORIDE (POUR BTL) OPTIME
TOPICAL | Status: DC | PRN
  Administered 2014-12-28: 1000 mL

## 2014-12-28 MED ORDER — PHENOL 1.4 % MT LIQD: 1.0000 | OROMUCOSAL | Status: DC | PRN

## 2014-12-28 MED ORDER — LABETALOL HCL 5 MG/ML IV SOLN
10.0000 mg | INTRAVENOUS | Status: DC | PRN
  Filled 2014-12-28: qty 4

## 2014-12-28 MED ORDER — DEXTROSE 5 % IV SOLN
1.5000 g | Freq: Two times a day (BID) | INTRAVENOUS | Status: AC
  Administered 2014-12-29 (×2): 1.5 g via INTRAVENOUS
  Filled 2014-12-28 (×2): qty 1.5

## 2014-12-28 MED ORDER — HYDRALAZINE HCL 20 MG/ML IJ SOLN: 5.0000 mg | INTRAMUSCULAR | Status: DC | PRN

## 2014-12-28 MED ORDER — ONDANSETRON HCL 4 MG/2ML IJ SOLN: 4.0000 mg | Freq: Four times a day (QID) | INTRAMUSCULAR | Status: DC | PRN

## 2014-12-28 MED ORDER — MAGNESIUM SULFATE 2 GM/50ML IV SOLN
2.0000 g | Freq: Every day | INTRAVENOUS | Status: DC | PRN
  Filled 2014-12-28: qty 50

## 2014-12-28 MED ORDER — KCL IN DEXTROSE-NACL 20-5-0.45 MEQ/L-%-% IV SOLN
INTRAVENOUS | Status: DC
  Administered 2014-12-28: 75 mL/h via INTRAVENOUS
  Administered 2014-12-29: 06:00:00 via INTRAVENOUS
  Filled 2014-12-28 (×7): qty 1000

## 2014-12-28 SURGICAL SUPPLY — 50 items
BANDAGE ELASTIC 4 VELCRO ST LF (GAUZE/BANDAGES/DRESSINGS) ×5 IMPLANT
BANDAGE ELASTIC 6 VELCRO ST LF (GAUZE/BANDAGES/DRESSINGS) ×3 IMPLANT
BANDAGE ESMARK 6X9 LF (GAUZE/BANDAGES/DRESSINGS) IMPLANT
BLADE SAW SAG 73X25 THK (BLADE) ×2
BLADE SAW SGTL 73X25 THK (BLADE) ×1 IMPLANT
BNDG CMPR 9X6 STRL LF SNTH (GAUZE/BANDAGES/DRESSINGS)
BNDG COHESIVE 6X5 TAN STRL LF (GAUZE/BANDAGES/DRESSINGS) ×3 IMPLANT
BNDG ESMARK 6X9 LF (GAUZE/BANDAGES/DRESSINGS)
BNDG GAUZE ELAST 4 BULKY (GAUZE/BANDAGES/DRESSINGS) ×4 IMPLANT
CANISTER SUCTION 2500CC (MISCELLANEOUS) ×3 IMPLANT
CLIP TI MEDIUM 6 (CLIP) IMPLANT
COVER SURGICAL LIGHT HANDLE (MISCELLANEOUS) ×2 IMPLANT
COVER TABLE BACK 60X90 (DRAPES) IMPLANT
CUFF TOURNIQUET SINGLE 24IN (TOURNIQUET CUFF) IMPLANT
CUFF TOURNIQUET SINGLE 34IN LL (TOURNIQUET CUFF) IMPLANT
CUFF TOURNIQUET SINGLE 44IN (TOURNIQUET CUFF) IMPLANT
DRAIN CHANNEL 19F RND (DRAIN) IMPLANT
DRAPE ORTHO SPLIT 77X108 STRL (DRAPES) ×6
DRAPE PROXIMA HALF (DRAPES) ×3 IMPLANT
DRAPE SURG ORHT 6 SPLT 77X108 (DRAPES) ×2 IMPLANT
DRSG ADAPTIC 3X8 NADH LF (GAUZE/BANDAGES/DRESSINGS) ×3 IMPLANT
ELECT REM PT RETURN 9FT ADLT (ELECTROSURGICAL) ×3
ELECTRODE REM PT RTRN 9FT ADLT (ELECTROSURGICAL) ×1 IMPLANT
EVACUATOR SILICONE 100CC (DRAIN) IMPLANT
GAUZE SPONGE 4X4 12PLY STRL (GAUZE/BANDAGES/DRESSINGS) ×6 IMPLANT
GLOVE BIO SURGEON STRL SZ7 (GLOVE) ×3 IMPLANT
GLOVE BIOGEL PI IND STRL 7.5 (GLOVE) ×1 IMPLANT
GLOVE BIOGEL PI INDICATOR 7.5 (GLOVE) ×2
GOWN STRL REUS W/ TWL LRG LVL3 (GOWN DISPOSABLE) ×3 IMPLANT
GOWN STRL REUS W/TWL LRG LVL3 (GOWN DISPOSABLE) ×9
KIT BASIN OR (CUSTOM PROCEDURE TRAY) ×3 IMPLANT
KIT ROOM TURNOVER OR (KITS) ×3 IMPLANT
NS IRRIG 1000ML POUR BTL (IV SOLUTION) ×3 IMPLANT
PACK GENERAL/GYN (CUSTOM PROCEDURE TRAY) ×3 IMPLANT
PAD ARMBOARD 7.5X6 YLW CONV (MISCELLANEOUS) ×6 IMPLANT
PADDING CAST COTTON 6X4 STRL (CAST SUPPLIES) IMPLANT
SPONGE GAUZE 4X4 12PLY STER LF (GAUZE/BANDAGES/DRESSINGS) ×2 IMPLANT
STAPLER VISISTAT 35W (STAPLE) ×3 IMPLANT
STOCKINETTE IMPERVIOUS LG (DRAPES) ×3 IMPLANT
SUT ETHILON 3 0 PS 1 (SUTURE) IMPLANT
SUT SILK 0 TIES 10X30 (SUTURE) IMPLANT
SUT SILK 2 0 (SUTURE) ×3
SUT SILK 2-0 18XBRD TIE 12 (SUTURE) ×1 IMPLANT
SUT SILK 3 0 (SUTURE) ×3
SUT SILK 3-0 18XBRD TIE 12 (SUTURE) ×1 IMPLANT
SUT VIC AB 2-0 CT1 18 (SUTURE) ×8 IMPLANT
SUT VIC AB 3-0 SH 18 (SUTURE) ×3 IMPLANT
TAPE UMBILICAL COTTON 1/8X30 (MISCELLANEOUS) ×3 IMPLANT
UNDERPAD 30X30 INCONTINENT (UNDERPADS AND DIAPERS) ×3 IMPLANT
WATER STERILE IRR 1000ML POUR (IV SOLUTION) ×3 IMPLANT

## 2014-12-28 NOTE — Progress Notes (Signed)
Subjective:    The patient reports feeling well this morning. She does continue have significant pain in her right lower extremity along with some abdominal pain after eating. She feels that meeting with palliative care yesterday was helpful. She would like to get through her lower extremity amputation and discuss care options with oncology.  Interval Events: -Afebrile, blood pressure mildly elevated this morning.  -Received 2 units of packed red cells yesterday.    Objective:    Vital Signs:   Temp:  [97 F (36.1 C)-98.4 F (36.9 C)] 97.4 F (36.3 C) (05/12 0727) Pulse Rate:  [74-104] 98 (05/12 0727) Resp:  [16-31] 20 (05/12 0727) BP: (149-171)/(53-94) 158/63 mmHg (05/12 0727) SpO2:  [89 %-100 %] 95 % (05/12 0727) Weight:  [207 lb 14.3 oz (94.3 kg)] 207 lb 14.3 oz (94.3 kg) (05/12 0500) Last BM Date: 12/27/14  24-hour weight change: Weight change: 13 lb 10.7 oz (6.2 kg)  Intake/Output:   Intake/Output Summary (Last 24 hours) at 12/28/14 1106 Last data filed at 12/28/14 1032  Gross per 24 hour  Intake   2269 ml  Output   2475 ml  Net   -206 ml      Physical Exam: General: Well-developed, well-nourished, in no acute distress; alert, appropriate and cooperative throughout examination.  Lungs:  Normal respiratory effort. Clear to auscultation BL without crackles or wheezes.  Heart: RRR. S1 and S2 normal without gallop, murmur, or rubs.  Abdomen:  Drain in place in LLQ with serosanguinous fluid, midline surgical incision dressed, clean dry, and intact.  Colostomy bag in RLQ with brown stool.  Minimal tenderness to palpation, bowel sounds present.  Extremities: Cold, dusky R foot with no palpable pulses, R calf tender to palpation midway to the foot and higher. Trace edema in arms and legs, right arm >left arm.   Labs:  Basic Metabolic Panel:  Recent Labs Lab 12/24/14 0315 12/24/14 1718 12/25/14 0458 12/26/14 0230 12/26/14 0820 12/27/14 0701 12/28/14 0613  NA  140  --  139  --  140 139 139  K 2.8* 3.2* 2.9*  --  3.5 3.5 3.3*  CL 102  --  103  --  106 106 106  CO2 29  --  28  --  28 27 27   GLUCOSE 114*  --  138*  --  128* 118* 114*  BUN 42*  --  32*  --  21* 18 14  CREATININE 2.09*  --  1.67*  --  1.28* 1.18* 1.06*  CALCIUM 7.1*  --  7.0*  --  7.1* 7.2* 7.2*  MG 1.6* 2.2 2.0 1.9  --   --   --   PHOS 4.0  --  3.1  --   --   --   --     Liver Function Tests:  Recent Labs Lab 12/24/14 0315 12/25/14 0458  AST 69* 49*  ALT 45 34  ALKPHOS 89 86  BILITOT 0.6 0.4  PROT 4.2* 4.1*  ALBUMIN 1.1* 1.1*   CBC:  Recent Labs Lab 12/24/14 0315 12/25/14 0458 12/26/14 0230 12/27/14 0701 12/28/14 0612  WBC 16.7* 20.4* 23.5* 20.2* 21.2*  NEUTROABS 14.4* 17.8*  --   --   --   HGB 8.8* 8.1* 7.8* 7.2* 9.5*  HCT 26.6* 25.4* 24.6* 22.9* 29.7*  MCV 78.5 79.9 79.4 81.2 81.8  PLT 227 297 345 401* 391   CBG:  Recent Labs Lab 12/27/14 2031 12/28/14 0021 12/28/14 0413 12/28/14 0729 12/28/14 1034  GLUCAP 132* 111*  110* 53* 110*    Microbiology: Results for orders placed or performed during the hospital encounter of 12/16/14  Surgical pcr screen     Status: None   Collection Time: 12/17/14  6:20 AM  Result Value Ref Range Status   MRSA, PCR NEGATIVE NEGATIVE Final   Staphylococcus aureus NEGATIVE NEGATIVE Final    Comment:        The Xpert SA Assay (FDA approved for NASAL specimens in patients over 74 years of age), is one component of a comprehensive surveillance program.  Test performance has been validated by [facility] for patients greater than or equal to 24 year old. It is not intended to diagnose infection nor to guide or monitor treatment.   Surgical pcr screen     Status: None   Collection Time: 12/17/14 11:45 AM  Result Value Ref Range Status   MRSA, PCR NEGATIVE NEGATIVE Final   Staphylococcus aureus NEGATIVE NEGATIVE Final    Comment:        The Xpert SA Assay (FDA approved for NASAL specimens in patients over 72  years of age), is one component of a comprehensive surveillance program.  Test performance has been validated by [facility] for patients greater than or equal to 61 year old. It is not intended to diagnose infection nor to guide or monitor treatment.   Culture, blood (routine x 2)     Status: None   Collection Time: 12/17/14  4:25 PM  Result Value Ref Range Status   Specimen Description BLOOD LEFT WRIST  Final   Special Requests BOTTLES DRAWN AEROBIC ONLY 1CC  Final   Culture   Final    NO GROWTH 5 DAYS Note: Culture results may be compromised due to an inadequate volume of blood received in culture bottles. Performed at [facility]    Report Status 12/23/2014 FINAL  Final  Culture, blood (routine x 2)     Status: None   Collection Time: 12/17/14  4:28 PM  Result Value Ref Range Status   Specimen Description BLOOD CENTRAL LINE  Final   Special Requests BOTTLES DRAWN AEROBIC ONLY 10CC  Final   Culture   Final    NO GROWTH 5 DAYS Performed at [facility]    Report Status 12/23/2014 FINAL  Final   Imaging: No results found.     Medications:    Infusions: . sodium chloride Stopped (12/23/14 1800)  . dextrose 5 % and 0.9% NaCl 75 mL/hr at 12/28/14 0300  . lactated ringers 50 mL/hr at 12/28/14 1101    Scheduled Medications: . [facility] Hold] antiseptic oral rinse  7 mL Mouth Rinse q12n4p  . [MAR Hold] aspirin  81 mg Oral Daily  . cefUROXime (ZINACEF)  IV  1.5 g Intravenous On Call to OR  . [MAR Hold] chlorhexidine  15 mL Mouth Rinse BID  . [MAR Hold] feeding supplement (ENSURE ENLIVE)  237 mL Oral BID BM  . [MAR Hold] FLUoxetine  20 mg Oral Daily  . [MAR Hold] insulin aspart  0-9 Units Subcutaneous 6 times per day    PRN Medications: [MAR Hold] albuterol, [MAR Hold] fentaNYL (SUBLIMAZE) injection, [MAR Hold] hydrALAZINE, [MAR Hold] oxyCODONE-acetaminophen   Assessment/ Plan:    Principal Problem:   Septic shock Active Problems:   Bowel  obstruction   Colonic mass   S/P partial colectomy   Severe sepsis with acute organ dysfunction   Noncompliance with medication regimen   Acute respiratory failure with hypoxia   Thrombus  Acute renal failure syndrome   Peritonitis   Colon cancer metastasized to multiple sites   Acute blood loss anemia   Melena   Diabetes type 2, controlled   Hypokalemia   Hypomagnesemia   Depression   Abdominal pain   Pain in joint, lower leg   Palliative care encounter  [phone] (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 I think it is likely that she will continue to improve after her amputation and be able to follow up with oncology as an outpatient. CIR will need to be re-consulted post-surgery. -Outpatient oncology follow-up. -Card modified diet. -Continue D5 normal saline at 75 mL per hour while nothing by mouth for surgery. -Continue Percocet 5-325, 1-2 tabs every 4 hours as needed. -Continue fentanyl 12.5-25 g IV every 2 hours needed. -Continue work with PT. -We will re-consult CIR tomorrow.  #Acute ischemic right foot secondary to thrombus from atrial fibrillation Dr. Bridgett Larsson of vascular surgery will take her to the operating room between 11 and 1 today. -Stop heparin and aspirin. 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 -Indefinite anticoagulation with Lovenox given malignancy will need to be resumed postsurgery. -Discuss timing of restarting anti-coagulation with vascular surgery. -Nothing by mouth for surgery, resume diet afterwards. -Continue to monitor on telemetry.  #Septic shock secondary to GI perforation White blood cell count slightly up today, likely due to foot ischemia. She remains afebrile off of antibiotics. Gen. surgery does not plan to drain the loculated abdominal fluid. They've reviewed the CT scan and think the loculations are  likely peritoneal fluid. -Continue to monitor for fever. -Trend white blood cell count post right BKA.  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  #Acute blood loss anemia Transfuse 2 units of RBCs yesterday with improvement of hemoglobin to 9.5 this morning. -Continue to monitor hemoglobin postoperatively.  #Hypertension Slightly elevated this morning. We will hold off on starting antihypertensives in preparation for surgery today. -Continue hydralazine 10 mg when necessary for SBP greater than 160.  #Acute renal failure Creatinine close to baseline today. -Continue IV fluids as above.  #Type 2 diabetes Good blood sugar control, receiving minimal insulin per sliding scale -Continue CBGs and sliding scale insulin sensitive ACHS.  #Depression -Continue Prozac 20 mg daily.   DVT PPX - heparin  CODE STATUS - DNR  CONSULTS PLACED - PCCM, Oncology, General Surgery, Vascular Surgery, GI, palliative care.  DISPO - Disposition is deferred at this time, awaiting improvement of ischemic foot.   Anticipated discharge in approximately 2-4 day(s).   The patient does not have a current PCP (No PCP Per Patient) and does need an [facility] hospital follow-up appointment after discharge.    Is the [facility] hospital follow-up appointment a one-time only appointment? yes.  Does the patient have transportation limitations that hinder transportation to clinic appointments? yes   SERVICE NEEDED AT [place]         Y = Yes, Blank = No PT: CIR  OT: CIR  RN:   Equipment:   Other:      Length of Stay: 12 day(s)   Signed: Charlesetta Shanks, MD  PGY-1, Internal Medicine Resident Pager: [phone] (7AM-5PM) 12/28/2014, 11:06 AM

## 2014-12-28 NOTE — Anesthesia Procedure Notes (Signed)
Procedure Name: Intubation Performed by: Gean Maidens Pre-anesthesia Checklist: Patient identified, Emergency Drugs available, Suction available, Timeout performed and Patient being monitored Patient Re-evaluated:Patient Re-evaluated prior to inductionOxygen Delivery Method: Circle system utilized Preoxygenation: Pre-oxygenation with 100% oxygen Intubation Type: IV induction Ventilation: Mask ventilation without difficulty Laryngoscope Size: Mac and 3 Grade View: Grade I Tube size: 7.0 mm Number of attempts: 1 Placement Confirmation: ETT inserted through vocal cords under direct vision,  positive ETCO2,  CO2 detector and breath sounds checked- equal and bilateral Secured at: 21 cm Tube secured with: Tape Dental Injury: Teeth and Oropharynx as per pre-operative assessment

## 2014-12-28 NOTE — Progress Notes (Addendum)
Pt rerurned from  OR per bed C/O pain 10/10 says pain is horrible. Stump dressing Rt leg clean,dry and intact. HOB

## 2014-12-28 NOTE — Interval H&P Note (Signed)
  [facility]  History and Physical Update  The patient was interviewed and re-examined.  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  The plan is right below-knee amputation.  I discussed in depth the nature of right below-the-knee amputation with the patient, including risks, benefits, and alternatives.  The patient is aware that the risks of below-the-knee amputation include but are not limited to: bleeding, infection, myocardial infarction, stroke, death, failure to heal amputation wound, and possible need for more proximal amputation.  The patient is aware of the risks and agrees proceed forward with the procedure.   Adele Barthel, MD [facility] Office: [phone] Pager: [phone]  12/28/2014, 1:55 PM

## 2014-12-28 NOTE — Anesthesia Postprocedure Evaluation (Signed)
  Anesthesia Post-op Note  Patient: Kathryn Knapp  Procedure(s) Performed: Procedure(s): AMPUTATION BELOW KNEE (Right)  Patient Location: PACU  Anesthesia Type:General  Level of Consciousness: awake, alert  and oriented  Airway and Oxygen Therapy: Patient Spontanous Breathing and Patient connected to nasal cannula oxygen  Post-op Pain: mild  Post-op Assessment: Post-op Vital signs reviewed, Patient's Cardiovascular Status Stable, Respiratory Function Stable, Patent Airway and Pain level controlled  Post-op Vital Signs: stable  Last Vitals:  Filed Vitals:   12/28/14 1402  BP: 163/65  Pulse: 96  Temp:   Resp: 25    Complications: No apparent anesthesia complications

## 2014-12-28 NOTE — Progress Notes (Signed)
[facility] Progress Note  11 Days Post-Op  Subjective: Pt doing okay, has less pain in her abdomen, tolerating diet, no N/V.  Having BM's and flatus in bag.  Pending Right BKA today.  She's a little nervous.  Objective: Vital signs in last 24 hours: Temp:  [97 F (36.1 C)-98.4 F (36.9 C)] 97.4 F (36.3 C) (05/12 0727) Pulse Rate:  [74-104] 98 (05/12 0727) Resp:  [16-31] 20 (05/12 0727) BP: (149-171)/(53-94) 158/63 mmHg (05/12 0727) SpO2:  [89 %-100 %] 95 % (05/12 0727) Weight:  [94.3 kg (207 lb 14.3 oz)] 94.3 kg (207 lb 14.3 oz) (05/12 0500) Last BM Date: 12/27/14  Intake/Output from previous day: 05/11 0701 - 05/12 0700 In: 2729 [I.V.:2024; Blood:695] Out: 2540 [Urine:1700; Drains:40; Stool:800] Intake/Output this shift: Total I/O In: -  Out: 300 [Urine:300]  PE: Gen:  Alert, NAD, pleasant Abd: Soft, NT/ND, +BS, no HSM, midline wound clean with some slough, drain with serosanguinous drainage (91mL), ostomy pink with brown soft stools   Lab Results:   Recent Labs  12/27/14 0701 12/28/14 0612  WBC 20.2* 21.2*  HGB 7.2* 9.5*  HCT 22.9* 29.7*  PLT 401* 391   BMET  Recent Labs  12/27/14 0701 12/28/14 0613  NA 139 139  K 3.5 3.3*  CL 106 106  CO2 27 27  GLUCOSE 118* 114*  BUN 18 14  CREATININE 1.18* 1.06*  CALCIUM 7.2* 7.2*   PT/INR  Recent Labs  12/28/14 0612  LABPROT 15.4*  INR 1.21   CMP     Component Value Date/Time   NA 139 12/28/2014 0613   K 3.3* 12/28/2014 0613   CL 106 12/28/2014 0613   CO2 27 12/28/2014 0613   GLUCOSE 114* 12/28/2014 0613   BUN 14 12/28/2014 0613   CREATININE 1.06* 12/28/2014 0613   CALCIUM 7.2* 12/28/2014 0613   PROT 4.1* 12/25/2014 0458   ALBUMIN 1.1* 12/25/2014 0458   AST 49* 12/25/2014 0458   ALT 34 12/25/2014 0458   ALKPHOS 86 12/25/2014 0458   BILITOT 0.4 12/25/2014 0458   GFRNONAA 51* 12/28/2014 0613   GFRAA 59* 12/28/2014 0613   Lipase     Component Value Date/Time   LIPASE 15  12/16/2014 1004       Studies/Results: No results found.  Anti-infectives: Anti-infectives    Start     Dose/Rate Route Frequency Ordered Stop   12/28/14 0800  cefUROXime (ZINACEF) 1.5 g in dextrose 5 % 50 mL IVPB     1.5 g 100 mL/hr over 30 Minutes Intravenous On call to O.R. 12/25/14 1324 12/29/14 0559   12/22/14 1000  piperacillin-tazobactam (ZOSYN) IVPB 2.25 g     2.25 g 100 mL/hr over 30 Minutes Intravenous Every 6 hours 12/22/14 0949 12/23/14 2230   12/19/14 1600  fluconazole (DIFLUCAN) IVPB 200 mg     200 mg 100 mL/hr over 60 Minutes Intravenous Every 24 hours 12/19/14 0901 12/23/14 1600   12/18/14 1100  fluconazole (DIFLUCAN) IVPB 400 mg  Status:  Discontinued     400 mg 100 mL/hr over 120 Minutes Intravenous Every 24 hours 12/18/14 1036 12/19/14 0901   12/17/14 1100  piperacillin-tazobactam (ZOSYN) IVPB 3.375 g  Status:  Discontinued     3.375 g 12.5 mL/hr over 240 Minutes Intravenous Every 8 hours 12/17/14 0956 12/22/14 0911   12/17/14 0600  cefoTEtan (CEFOTAN) 2 g in dextrose 5 % 50 mL IVPB     2 g 100 mL/hr over 30 Minutes Intravenous To Surgery 12/17/14 0542  12/17/14 0726       Assessment/Plan Perforation of cecum, large obstructing tumor at the splenic flexure with peritoneal metastasis POD#11 exploratory laparotomy right, transverse, partial 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 Grandville Silos -WOC following  -BID wet to dry dressing changes -Drain care  -Path-adeno with mets. Hem/onc consulted -On carb mod diet, NPO for surgery -CT on 12/25/14 shows loculated ascites likely secondary to peritoneal metastasis, no plans for additional drainage currently  ID-zosyn since 5/1---> fluconazole 5/2 WBC up, afebrile.  Ischemic right foot - Vascular is planning OR tomorrow for Right BKA Leukocytosis - 21.2 VTE prophylaxis-SCD/heparin, mobilize FEN-On carb mod diet Disp - PT recommending CIR after BKA     LOS: 12 days    DORT,  Robbie Rideaux 12/28/2014, 8:54 AM Pager: [phone]

## 2014-12-28 NOTE — Consult Note (Signed)
Physical Medicine and Rehabilitation Consult Reason for Consult: Right BKA/bowel obstruction colonic mass Referring Physician: Internal medicine   HPI: Kathryn Knapp is a 74 y.o. right handed female with history of hypertension as well as diabetes mellitus. Independent prior to admission living with her daughter and grandchildren. Presented 12/16/2014 with nonspecific abdominal pain as well as nausea vomiting. He also had some recent weight loss. CT abdomen revealed a mass in the splenic flexure as well as nodule left adrenal gland. Abdominal pain progressed follow-up scan revealed some free fluid and air. Underwent exploratory laparotomy right transverse and partial left colectomy with en block partial gastrectomy, and ileostomy 12/17/2014 per Dr. Grandville Silos. Pathology report adenocarcinoma with metastases and await plan of care. Patient required ventilatory management followed by critical care postoperatively. Wound care nurse consulted for sacral decubitus with dressing change recommendations. She was extubated 12/21/2014 with slow progress. Palliative care consulted 12/27/2014. 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 Bridgett Larsson. Acute blood loss anemia 7.2-9.6 and monitored. Physical and occupational therapy evaluations are pending. M.D. has requested physical medicine rehabilitation consult   Review of Systems  Gastrointestinal: Positive for nausea, vomiting and abdominal pain.   Past Medical History  Diagnosis Date  . Diabetes mellitus without complication   . Hypertension    Past Surgical History  Procedure Laterality Date  . Cholecystectomy    . Abdominal hysterectomy    . Laparotomy N/A 12/17/2014    Procedure: EXPLORATORY LAPAROTOMY Right, Transverse, partial left colectomy with en bloc partial gastrectomy illeostomy;  Surgeon: Georganna Skeans,  MD;  Location: [facility];  Service: General;  Laterality: N/A;   History reviewed. No pertinent family history. Social History:  reports that she has never smoked. She does not have any smokeless tobacco history on file. She reports that she does not drink alcohol or use illicit drugs. Allergies: No Known Allergies Medications Prior to Admission  Medication Sig Dispense Refill  . ibuprofen (ADVIL,MOTRIN) 200 MG tablet Take 200 mg by mouth every 6 (six) hours as needed for mild pain or moderate pain.     Marland Kitchen HYDROcodone-acetaminophen (NORCO/VICODIN) 5-325 MG per tablet Take 1 tablet by mouth every 6 (six) hours as needed for moderate pain. (Patient not taking: Reported on 12/16/2014) 8 tablet 0  . ibuprofen (ADVIL,MOTRIN) 800 MG tablet Take 1 tablet (800 mg total) by mouth every 8 (eight) hours as needed. (Patient not taking: Reported on 12/16/2014) 21 tablet 0    Home: Home Living Family/patient expects to be discharged to:: Private residence Living Arrangements: Children, Other relatives (3 young grandchildren) Available Help at Discharge: Family, Available 24 hours/day Type of Home: Mobile home Home Access: Stairs to enter [employer] of Steps: 4 Entrance Stairs-Rails: Right, Left, Can reach both Home Layout: One level Additional Comments: pt is at risk for losing her home, case managment is involved  Functional History: Prior Function Level of Independence: Independent Functional Status:  Mobility: Bed Mobility Overal bed mobility: Needs Assistance Bed Mobility: Rolling Rolling: Min assist Supine to sit: Mod assist, HOB elevated General bed mobility comments: pt "hooked arms" with PT to pull herself forward to long-sitting (from supported sitting in bed) Transfers Overall transfer level: Needs assistance Equipment used:  (requires lift equipment) General transfer comment: nursing using lift equipment      ADL: ADL Overall ADL's : Needs  assistance/impaired Eating/Feeding: Set up, Bed level Grooming: Wash/dry hands,  Wash/dry face, Set up, Brushing hair, Maximal assistance, Sitting, Bed level Upper Body Bathing: Moderate assistance, Bed level Lower Body Bathing: Total assistance, Bed level Upper Body Dressing : Minimal assistance, Bed level Lower Body Dressing: Total assistance, Bed level Toilet Transfer Details (indicate Knapp type and reason): assisted to roll to R side for bedpan Toileting- Clothing Manipulation and Hygiene: Total assistance, Bed level  Cognition: Cognition Overall Cognitive Status: Within Functional Limits for tasks assessed Orientation Level: Oriented X4 Cognition Arousal/Alertness: Awake/alert Behavior During Therapy: WFL for tasks assessed/performed Overall Cognitive Status: Within Functional Limits for tasks assessed  Blood pressure 158/66, pulse 83, temperature 97 F (36.1 C), temperature source Oral, resp. rate 29, height 5\' 11"  (1.803 m), weight 94.3 kg (207 lb 14.3 oz), SpO2 97 %. Physical Exam  Vitals reviewed. Constitutional: She is oriented to person, place, and time.  HENT:  Head: Normocephalic.  Eyes: EOM are normal.  Neck: Normal range of motion. Neck supple. No thyromegaly present.  Cardiovascular: Normal rate and regular rhythm.   Respiratory: Effort normal and breath sounds normal. No respiratory distress.  GI: Soft. Bowel sounds are normal. She exhibits no distension.  Neurological: She is alert and oriented to person, place, and time.  Alert and appropriate. Followed basic commands. Has reasonable insight and awareness. UE: 3+ deltoid, bicep, tricep and 4/5 wrist/hands. LLE: 2/5 HF, KE and 3+ to 4-/5 ankles. RLE: limited due to pain. Can move right leg at hip 1-2/5.   Skin:  Amputation site is dressed appropriately tender  Psychiatric: She has a normal mood and affect. Her behavior is normal. Thought content normal.    Results for orders placed or performed during the  hospital encounter of 12/16/14 (from the past 24 hour(s))  Glucose, capillary     Status: Abnormal   Collection Time: 12/27/14  4:20 PM  Result Value Ref Range   Glucose-Capillary 136 (H) 70 - 99 mg/dL  Glucose, capillary     Status: Abnormal   Collection Time: 12/27/14  8:31 PM  Result Value Ref Range   Glucose-Capillary 132 (H) 70 - 99 mg/dL  Glucose, capillary     Status: Abnormal   Collection Time: 12/28/14 12:21 AM  Result Value Ref Range   Glucose-Capillary 111 (H) 65 - 99 mg/dL  Glucose, capillary     Status: Abnormal   Collection Time: 12/28/14  4:13 AM  Result Value Ref Range   Glucose-Capillary 110 (H) 65 - 99 mg/dL  CBC     Status: Abnormal   Collection Time: 12/28/14  6:12 AM  Result Value Ref Range   WBC 21.2 (H) 4.0 - 10.5 K/uL   RBC 3.63 (L) 3.87 - 5.11 MIL/uL   Hemoglobin 9.5 (L) 12.0 - 15.0 g/dL   HCT 29.7 (L) 36.0 - 46.0 %   MCV 81.8 78.0 - 100.0 fL   MCH 26.2 26.0 - 34.0 pg   MCHC 32.0 30.0 - 36.0 g/dL   RDW 16.8 (H) 11.5 - 15.5 %   Platelets 391 150 - 400 K/uL  Protime-INR     Status: Abnormal   Collection Time: 12/28/14  6:12 AM  Result Value Ref Range   Prothrombin Time 15.4 (H) 11.6 - 15.2 seconds   INR 1.21 0.00 - 9.52  Basic metabolic panel     Status: Abnormal   Collection Time: 12/28/14  6:13 AM  Result Value Ref Range   Sodium 139 135 - 145 mmol/L   Potassium 3.3 (L) 3.5 - 5.1 mmol/L   Chloride 106  101 - 111 mmol/L   CO2 27 22 - 32 mmol/L   Glucose, Bld 114 (H) 65 - 99 mg/dL   BUN 14 6 - 20 mg/dL   Creatinine, Ser 1.06 (H) 0.44 - 1.00 mg/dL   Calcium 7.2 (L) 8.9 - 10.3 mg/dL   GFR calc non Af Amer 51 (L) >60 mL/min   GFR calc Af Amer 59 (L) >60 mL/min   Anion gap 6 5 - 15  Heparin level (unfractionated)     Status: Abnormal   Collection Time: 12/28/14  6:13 AM  Result Value Ref Range   Heparin Unfractionated 0.10 (L) 0.30 - 0.70 IU/mL  Glucose, capillary     Status: Abnormal   Collection Time: 12/28/14  7:29 AM  Result Value Ref Range    Glucose-Capillary 122 (H) 65 - 99 mg/dL  Glucose, capillary     Status: Abnormal   Collection Time: 12/28/14 10:34 AM  Result Value Ref Range   Glucose-Capillary 110 (H) 65 - 99 mg/dL  Glucose, capillary     Status: Abnormal   Collection Time: 12/28/14  2:08 PM  Result Value Ref Range   Glucose-Capillary 105 (H) 65 - 99 mg/dL   Comment 1 Notify RN    Comment 2 Document in Chart    No results found.  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 Does the need for close, 24 hr/day medical supervision in concert with the patient's rehab needs make it unreasonable for this patient to be served in a less intensive setting? Yes 2. Co-Morbidities requiring supervision/potential complications: sepsis, wound care, dm2, depression 3. Due to bladder management, bowel management, safety, skin/wound care, disease management, medication administration, pain management and patient education, does the patient require 24 hr/day rehab nursing? Yes 4. Does the patient require coordinated care of a physician, rehab nurse, PT (1-2 hrs/day, 5 days/week) and OT (1-2 hrs/day, 5 days/week) to address physical and functional deficits in the context of the above medical diagnosis(es)? Yes Addressing deficits in the following areas: balance, endurance, locomotion, strength, transferring, bowel/bladder control, bathing, dressing, feeding, grooming, toileting and psychosocial support 5. Can the patient actively participate in an intensive therapy program of at least 3 hrs of therapy per day at least 5 days per week? Yes 6. The potential for patient to make measurable gains while on inpatient rehab is excellent 7. Anticipated functional outcomes upon discharge from inpatient rehab are supervision and min assist  with PT, supervision and min assist with OT, n/a with SLP. 8. Estimated rehab length of stay to reach the above functional goals is: 15-23 days 9. Does the  patient have adequate social supports and living environment to accommodate these discharge functional goals? Yes 10. Anticipated D/C setting: Home 11. Anticipated post D/C treatments: HH therapy and Outpatient therapy 12. Overall Rehab/Functional Prognosis: excellent  RECOMMENDATIONS: This patient's condition is appropriate for continued rehabilitative care in the following setting: CIR Patient has agreed to participate in recommended program. Yes Note that insurance prior authorization may be required for reimbursement for recommended care.  Comment: Rehab Admissions Coordinator to follow up.  Thanks,  Meredith Staggers, MD, Mellody Drown     12/28/2014

## 2014-12-28 NOTE — Transfer of Care (Signed)
Immediate Anesthesia Transfer of Care Note  Patient: Kathryn Knapp  Procedure(s) Performed: Procedure(s): AMPUTATION BELOW KNEE (Right)  Patient Location: PACU  Anesthesia Type:General  Level of Consciousness: awake, alert  and oriented  Airway & Oxygen Therapy: Patient Spontanous Breathing and Patient connected to nasal cannula oxygen  Post-op Assessment: Report given to RN and Post -op Vital signs reviewed and stable  Post vital signs: Reviewed and stable  Last Vitals:  Filed Vitals:   12/28/14 0727  BP: 158/63  Pulse: 98  Temp: 36.3 C  Resp: 20    Complications: No apparent anesthesia complications

## 2014-12-28 NOTE — Progress Notes (Signed)
Text page to DR Bridgett Larsson Pt not getting any pain relief from Fentanyl 25 q 2hr New orders received

## 2014-12-28 NOTE — Anesthesia Preprocedure Evaluation (Addendum)
Anesthesia Evaluation  Patient identified by MRN, date of birth, ID band Patient awake    Reviewed: Allergy & Precautions, NPO status , Patient's Chart, lab work & pertinent test results  Airway Mallampati: II  TM Distance: >3 FB Neck ROM: Full    Dental  (+) Edentulous Upper, Edentulous Lower   Pulmonary  breath sounds clear to auscultation  + decreased breath sounds      Cardiovascular hypertension, Rhythm:Regular Rate:Normal     Neuro/Psych    GI/Hepatic   Endo/Other  diabetes  Renal/GU      Musculoskeletal   Abdominal   Peds  Hematology   Anesthesia Other Findings   Reproductive/Obstetrics                            Anesthesia Physical Anesthesia Plan  ASA: III  Anesthesia Plan: General   Post-op Pain Management:    Induction: Intravenous  Airway Management Planned: Oral ETT  Additional Equipment:   Intra-op Plan:   Post-operative Plan: Extubation in OR  Informed Consent: I have reviewed the patients History and Physical, chart, labs and discussed the procedure including the risks, benefits and alternatives for the proposed anesthesia with the patient or authorized representative who has indicated his/her understanding and acceptance.     Plan Discussed with: CRNA and Anesthesiologist  Anesthesia Plan Comments:         Anesthesia Quick Evaluation

## 2014-12-28 NOTE — Progress Notes (Signed)
  Date: 12/28/2014  Patient name: Kathryn Knapp  Medical record number: [id number]  Date of birth: May 12, 1941   This patient has been seen and the plan of care was discussed with the house staff. Please see their note for complete details. I concur with their findings with the following additions/corrections: I saw [name] this AM, prior to her surgery. She is now s/p R BKA. Post-op will need pain control, PT, and dispo plans. She felt teh conversation with Palliative was useful even though her daughters could not attend.  Bartholomew Crews, MD 12/28/2014, 2:49 PM

## 2014-12-28 NOTE — H&P (View-Only) (Signed)
Referred by:  PCCM  Reason for referral: B leg ischemia  History of Present Illness  Kathryn Knapp is a 74 y.o. (1940/09/29) female who presents with chief complaint: free air.  History is obtained from chart as patient is intubated and sedated.  Pt was undergoing work-up for colonoscopy for concerns of colonic mass.  She was found to have free air on a CT chest and was taken to OR on 12/17/14.  Intraoperatively, perforated cecum with large obstructing tumor at splenic flexure with invasion of adjacent viscera was noted.  There was frank contamination of the abdominal cavity due to the perforationsThe patient underwent Right, transverse, partial left colectomy, partial gastrectomy, and end ileostomy.  The patient has been in shock, requiring vasopressors.  Last night, loss of peripheral pulses in the feet was noted by the PCCM service. No peripheral pulse exam is document in the admission H&P.  The patient risk factors for atherosclerosis included: DM and HTN.  Past Medical History  Diagnosis Date  . Diabetes mellitus without complication   . Hypertension     Past Surgical History  Procedure Laterality Date  . Cholecystectomy    . Abdominal hysterectomy    . Laparotomy N/A 12/17/2014    Procedure: EXPLORATORY LAPAROTOMY Right, Transverse, partial left colectomy with en bloc partial gastrectomy illeostomy;  Surgeon: Georganna Skeans, MD;  Location: [facility];  Service: General;  Laterality: N/A;    History   Social History  . Marital Status: Divorced    Spouse Name: N/A  . Number of Children: N/A  . Years of Education: N/A   Occupational History  . Not on file.   Social History Main Topics  . Smoking status: Never Smoker   . Smokeless tobacco: Not on file  . Alcohol Use: No  . Drug Use: No  . Sexual Activity: Not on file   Other Topics Concern  . Not on file   Social History Narrative    Family History cannot be obtained as the patient is intubated and sedated   Current  Facility-Administered Medications  Medication Dose Route Frequency Provider Last Rate Last Dose  . 0.9 %  sodium chloride infusion   Intravenous Continuous Wilford Corner, MD 20 mL/hr at 12/19/14 0400    . albuterol (PROVENTIL) (2.5 MG/3ML) 0.083% nebulizer solution 2.5 mg  2.5 mg Nebulization Q4H PRN Marijean Heath, NP      . antiseptic oral rinse (CPC / CETYLPYRIDINIUM CHLORIDE 0.05%) solution 7 mL  7 mL Mouth Rinse QID Georganna Skeans, MD   7 mL at 12/19/14 0400  . chlorhexidine (PERIDEX) 0.12 % solution 15 mL  15 mL Mouth Rinse BID Georganna Skeans, MD   15 mL at 12/19/14 0817  . fentaNYL (SUBLIMAZE) 2,500 mcg in sodium chloride 0.9 % 250 mL (10 mcg/mL) infusion  25-400 mcg/hr Intravenous Continuous Marijean Heath, NP 5 mL/hr at 12/19/14 1000 50 mcg/hr at 12/19/14 1000  . fentaNYL (SUBLIMAZE) bolus via infusion 25 mcg  25 mcg Intravenous Q1H PRN Marijean Heath, NP      . fluconazole (DIFLUCAN) IVPB 200 mg  200 mg Intravenous Q24H Otilio Miu, RPH      . heparin injection 5,000 Units  5,000 Units Subcutaneous 3 times per day Georganna Skeans, MD   5,000 Units at 12/19/14 0524  . hydrocortisone sodium succinate (SOLU-CORTEF) 100 MG injection 50 mg  50 mg Intravenous Q6H Juanito Doom, MD   50 mg at 12/19/14 1016  . insulin aspart (  novoLOG) injection 2-6 Units  2-6 Units Subcutaneous 6 times per day Elsie Stain, MD   4 Units at 12/19/14 0440  . midazolam (VERSED) injection 1 mg  1 mg Intravenous Q2H PRN Marijean Heath, NP   1 mg at 12/19/14 0244  . norepinephrine (LEVOPHED) 16 mg in dextrose 5 % 250 mL (0.064 mg/mL) infusion  2-50 mcg/min Intravenous Continuous Rush Farmer, MD 18.8 mL/hr at 12/19/14 1000 20 mcg/min at 12/19/14 1000  . pantoprazole (PROTONIX) injection 40 mg  40 mg Intravenous Q24H Marijean Heath, NP   40 mg at 12/18/14 1200  . piperacillin-tazobactam (ZOSYN) IVPB 3.375 g  3.375 g Intravenous Q8H Georganna Skeans, MD   3.375 g at 12/19/14  2841  . promethazine (PHENERGAN) injection 6.25-12.5 mg  6.25-12.5 mg Intravenous Q15 min PRN Rica Koyanagi, MD      . sodium bicarbonate 150 mEq in dextrose 5 % 1,000 mL infusion   Intravenous Continuous Juanito Doom, MD      . vasopressin (PITRESSIN) 40 Units in sodium chloride 0.9 % 250 mL (0.16 Units/mL) infusion  0.03 Units/min Intravenous Continuous Juanito Doom, MD 11.3 mL/hr at 12/19/14 1012 0.03 Units/min at 12/19/14 1012     No Known Allergies   REVIEW OF SYSTEMS:  (Positives checked otherwise negative)  Cannot be obtained due to patient intubation and sedation   Physical Examination  Filed Vitals:   12/19/14 1000 12/19/14 1015 12/19/14 1030 12/19/14 1045  BP: 96/38     Pulse: 79 79 81 72  Temp:      TempSrc:      Resp: 15 15 15 15   Height:      Weight:      SpO2: 100% 100% 100% 100%    Body mass index is 26.16 kg/(m^2).  General: intubated, sedated  Head: [place]/AT  Ear/Nose/Throat: intubated, no obvious nasopharyngeal drainage  Eyes: PERRL, unable to test movement  Neck: Supple, no nuchal rigidity, no palpable LAD  Pulmonary: Sym exp, Bilateral rales, no rhonchi, & wheezing, on vent Vent Mode:  [-] PRVC FiO2 (%):  [40 %-50 %] 40 % Set Rate:  [15 bmp] 15 bmp Vt Set:  [530 mL] 530 mL PEEP:  [5 cmH20] 5 cmH20 Plateau Pressure:  [16 cmH20-20 cmH20] 18 cmH20 ABG    Component Value Date/Time   PHART 7.240* 12/18/2014 1143   PCO2ART 36.7 12/18/2014 1143   PO2ART 156.0* 12/18/2014 1143   HCO3 15.7* 12/18/2014 1143   TCO2 17 12/18/2014 1143   ACIDBASEDEF 11.0* 12/18/2014 1143   O2SAT 99.0 12/18/2014 1143    Cardiac: RRR, Nl S1, S2, no Murmurs, rubs or gallops; drips: Vasopressin 0.03 U/min, NE 20 mcg/min  Vascular: Vessel Right Left  Radial Palpable Palpable  Ulnar Not Palpable Not Palpable  Brachial Palpable Palpable  Carotid Palpable, without bruit Palpable, without bruit  Aorta Not palpable N/A  Femoral Palpable Palpable  Popliteal Not  palpable Not palpable  PT Not Palpable Not Palpable  DP Not Palpable Not Palpable   Gastrointestinal: bandaged, no grimace to palpation, viable ileostomy, -G/R  Musculoskeletal: cannot be tested due to sedation, B feet pallorous with some cyanosis, R colder than L, no rigor mortis  Neurologic: cannot be tested due to sedation, Fentanyl 100 mcg/hr  Psychiatric: cannot be tested due to sedation  Dermatologic: See M/S exam for extremity exam, no rashes otherwise noted  Lymph : No Cervical, Axillary, or Inguinal lymphadenopathy    Laboratory: CBC:    Component Value Date/Time  WBC 20.5* 12/19/2014 0330   RBC 3.75* 12/19/2014 0330   RBC 4.00 12/16/2014 1632   HGB 9.3* 12/19/2014 0330   HCT 29.4* 12/19/2014 0330   PLT 158 12/19/2014 0330   MCV 78.4 12/19/2014 0330   MCH 24.8* 12/19/2014 0330   MCHC 31.6 12/19/2014 0330   RDW 16.2* 12/19/2014 0330   LYMPHSABS 0.5* 12/16/2014 1004   MONOABS 0.4 12/16/2014 1004   EOSABS 0.0 12/16/2014 1004   BASOSABS 0.0 12/16/2014 1004    BMP:    Component Value Date/Time   NA 132* 12/19/2014 0330   K 4.3 12/19/2014 0330   CL 106 12/19/2014 0330   CO2 15* 12/19/2014 0330   GLUCOSE 162* 12/19/2014 0330   BUN 32* 12/19/2014 0330   CREATININE 2.23* 12/19/2014 0330   CALCIUM 6.9* 12/19/2014 0330   GFRNONAA 21* 12/19/2014 0330   GFRAA 24* 12/19/2014 0330    Coagulation: Lab Results  Component Value Date   INR 1.19 12/16/2014   No results found for: PTT  Radiology: Dg Chest Port 1 View  12/19/2014   CLINICAL DATA:  Acute respiratory failure with hypoxia.  EXAM: PORTABLE CHEST - 1 VIEW  COMPARISON:  12/18/2014.  FINDINGS: Unchanged support tubes and lines. LEFT lower lobe atelectasis with pleural effusion, possible superimposed infiltrate, stable decreased lung volumes.  IMPRESSION: Stable chest.   Electronically Signed   By: Rolla Flatten M.D.   On: 12/19/2014 07:10   Dg Chest Portable 1 View  12/18/2014   CLINICAL DATA:  Respiratory  failure  EXAM: PORTABLE CHEST - 1 VIEW  COMPARISON:  12/17/2014  FINDINGS: The endotracheal tube tip is 3.9 cm above the carina. The nasogastric tube extends into the stomach and off the inferior edge of the image. The right jugular central line extends to the cavoatrial junction. There is basilar consolidation on the left with obscured diaphragmatic contour. There is a small left pleural effusion. The right lung is clear.  IMPRESSION: Support equipment appears satisfactorily positioned.  There is dense left base consolidation and small left pleural effusion.   Electronically Signed   By: Andreas Newport M.D.   On: 12/18/2014 06:06   Dg Chest Port 1 View  12/17/2014   CLINICAL DATA:  Central line placement.  EXAM: PORTABLE CHEST - 1 VIEW  COMPARISON:  12/17/2014  FINDINGS: Endotracheal tube is in place with tip 5.4 cm above carina. Right IJ central line tip overlies the level of the lower superior vena cava. Heart is enlarged. Suspect left pleural effusion versus artifact related to positioning. No pneumothorax.  IMPRESSION: 1. Interval placement of right-sided IJ line, tip overlying the level of the lower superior vena cava. 2. No pneumothorax PE 3. Possible left pleural effusion.   Electronically Signed   By: Nolon Nations M.D.   On: 12/17/2014 14:56     Medical Decision Making  Kathryn Knapp is a 74 y.o. female who presents with: B leg ischemia, s/p Right, transverse, partial left colectomy, partial gastrectomy, and end ileostomy for perforated cecum with large malignant splenic flexure mass invading stomach and diaphragm, Septic vs cardiac shock   Intraoperative findings would stage this patient at Stage IV with wide metastasis suggested by imaging studies.  From the big picture viewpoint, the leg ischemia is a small foot note.   I suspect this is likely chronic PAD in the setting of vasoconstriction from significant vasopressor needs to support blood pressure.  Patient is not stable enough  for any invasive diagnostic studies, i.e. Angiography,  much less any interventions.  If the patient survives her colon perforation, pt will need further investigation and/or interventions including angiography, amputation or revascularization depending her peripheral status upon recovery.  Meanwhile, ABI and B arterial duplex may provide additional insight to her baseline disease status.  Available as needed.  Thank you for allowing [place] to participate in this patient's care.  Adele Barthel, MD [facility] Office: [phone] Pager: [phone]  12/19/2014, 11:13 AM

## 2014-12-28 NOTE — Op Note (Signed)
OPERATIVE NOTE   PROCEDURE: right below-the-knee amputation  PRE-OPERATIVE DIAGNOSIS: right foot gangrene  POST-OPERATIVE DIAGNOSIS: same as above  SURGEON: Adele Barthel, MD  ASSISTANT(S): Gerri Lins, PAC   ANESTHESIA: general  ESTIMATED BLOOD LOSS: 50 cc  FINDING(S): 1.  Viable muscle through right calf 2.  Edema through right calf muscles  SPECIMEN(S):  right below-the-knee amputation  INDICATIONS:   Kathryn Knapp is a 74 y.o. female who presents with right foot gangrene.  The patient is scheduled for a right below-the-knee amputation.  I discussed in depth with the patient the risks, benefits, and alternatives to this procedure.  The patient is aware that the risk of this operation included but are not limited to:  bleeding, infection, myocardial infarction, stroke, death, failure to heal amputation wound, and possible need for more proximal amputation.  The patient is aware of the risks and agrees proceed forward with the procedure.  DESCRIPTION:  After full informed written consent was obtained from the patient, the patient was brought back to the operating room, and placed supine upon the operating table.  Prior to induction, the patient received IV antibiotics.  The patient was then prepped and draped in the standard fashion for a below-the-knee amputation.  I placed a non-sterile tourniquet on the thigh prior to the procedure.  After obtaining adequate anesthesia, the patient was prepped and draped in the standard fashion for a below-the-knee amputation.  I marked out the anterior incision 10 cm distal to the tibial tuberosity and then the marked out a posterior flap that was one third of the circumference  of the calf in length.  I then exsanguinated the leg with a Esmarch bandage and then inflated the tourniquet to 250 mm Hg.   I made the incisions for these flaps, and then dissected through the subcutaneous tissue, fascia, and muscle anteriorly.  I elevated  the  periosteal tissue superiorly so that the tibia was about 3 cm shorter than the anterior skin flap.  I then transected the tibia with a power saw and then took a wedge off the tibia anteriorly with the power saw.  Then I smoothed out the rough edges with a rasp.  In a similar fashion, I cut back the fibula about two centimeters higher than the level of the tibia with a bone cutter.  I then used the electrocautery to develop a tissue plane through the posterior muscle along the fibula.  In such fashion, the posterior flap was developed.  At this point, the specimen was passed off the field as the below-the-knee amputation.  At this point, I clamped all visibly bleeding arteries and veins using a combination of suture ligation with Vicryl suture and electrocautery.  The tourniquet was then deflated at this point.  Bleeding continued to be controlled with electrocautery and suture ligature.  The stump was washed off with sterile normal saline and no further active bleeding was noted.  I reapproximated the anterior and posterior fascia  with interrupted stitches of 2-0 Vicryl.  This was completed along the entire length of anterior and posterior fascia until there were no more loose space in the fascial line.  The skin was then  reapproximated with staples.  The stump was washed off and dried.  The incision was dressed with Adpatec and  then fluffs were applied.  Kerlix was wrapped around the leg and then gently an ACE wrap was applied.    COMPLICATIONS: none  CONDITION: stable   Adele Barthel, MD [facility] Office: [phone] Pager: [phone]  12/28/2014, 1:43 PM

## 2014-12-29 ENCOUNTER — Telehealth: Payer: Self-pay | Admitting: Vascular Surgery

## 2014-12-29 ENCOUNTER — Encounter (HOSPITAL_COMMUNITY): Payer: Self-pay

## 2014-12-29 LAB — CBC
HCT: 30.9 % — ABNORMAL LOW (ref 36.0–46.0)
Hemoglobin: 9.8 g/dL — ABNORMAL LOW (ref 12.0–15.0)
MCH: 26.3 pg (ref 26.0–34.0)
MCHC: 31.7 g/dL (ref 30.0–36.0)
MCV: 82.8 fL (ref 78.0–100.0)
PLATELETS: 411 10*3/uL — AB (ref 150–400)
RBC: 3.73 MIL/uL — ABNORMAL LOW (ref 3.87–5.11)
RDW: 17.5 % — AB (ref 11.5–15.5)
WBC: 20.3 10*3/uL — ABNORMAL HIGH (ref 4.0–10.5)

## 2014-12-29 LAB — BASIC METABOLIC PANEL
Anion gap: 8 (ref 5–15)
BUN: 9 mg/dL (ref 6–20)
CO2: 26 mmol/L (ref 22–32)
Calcium: 7.4 mg/dL — ABNORMAL LOW (ref 8.9–10.3)
Chloride: 102 mmol/L (ref 101–111)
Creatinine, Ser: 1.06 mg/dL — ABNORMAL HIGH (ref 0.44–1.00)
GFR calc non Af Amer: 51 mL/min — ABNORMAL LOW (ref >60)
GFR, EST AFRICAN AMERICAN: 59 mL/min — AB (ref >60)
Glucose, Bld: 100 mg/dL — ABNORMAL HIGH (ref 65–99)
Potassium: 3.5 mmol/L (ref 3.5–5.1)
SODIUM: 136 mmol/L (ref 135–145)

## 2014-12-29 LAB — HEPARIN LEVEL (UNFRACTIONATED)

## 2014-12-29 LAB — GLUCOSE, CAPILLARY
GLUCOSE-CAPILLARY: 102 mg/dL — AB (ref 65–99)
GLUCOSE-CAPILLARY: 177 mg/dL — AB (ref 65–99)
GLUCOSE-CAPILLARY: 148 mg/dL — AB (ref 65–99)
GLUCOSE-CAPILLARY: 118 mg/dL — AB (ref 65–99)
Glucose-Capillary: 129 mg/dL — ABNORMAL HIGH (ref 65–99)
Glucose-Capillary: 142 mg/dL — ABNORMAL HIGH (ref 65–99)

## 2014-12-29 MED ORDER — AMLODIPINE BESYLATE 5 MG PO TABS
5.0000 mg | ORAL_TABLET | Freq: Every day | ORAL | Status: DC
  Administered 2014-12-29 – 2015-01-01 (×4): 5 mg via ORAL
  Filled 2014-12-29 (×4): qty 1

## 2014-12-29 MED ORDER — ENOXAPARIN SODIUM 100 MG/ML ~~LOC~~ SOLN
90.0000 mg | Freq: Two times a day (BID) | SUBCUTANEOUS | Status: DC
  Administered 2014-12-29 – 2014-12-30 (×3): 90 mg via SUBCUTANEOUS
  Filled 2014-12-29 (×6): qty 1

## 2014-12-29 NOTE — Progress Notes (Signed)
Report called to [address] spoke with Ulice Dash  Pt will come in bed.

## 2014-12-29 NOTE — Telephone Encounter (Signed)
-----   Message from Denman George, RN sent at 12/28/2014  3:09 PM EDT ----- Regarding: Zigmund Daniel log; also needs f/u in 4-6 wks with BLC   ----- Message -----    From: Conrad Orinda, MD    Sent: 12/28/2014   1:46 PM      To: Vvs Charge [address]  CHERLYN SYRING [id number] 19-Nov-1940  Procedure: R BKA  Asst: Gerri Lins, [facility]   Follow-up: 4-6 weeks

## 2014-12-29 NOTE — Evaluation (Signed)
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 Given her prolonged, complicated hospitalization, pt is generally weak with poor activity tolerance. Pt with increased pain in her R residual limb and abdomen, but still agreeable to working with therapies at bed level and EOB. Pt with 02 sats at 94-100% on 4L, dropped to 85% on RA. Pt will need extensive rehab.  Will follow acutely.    Follow Up Recommendations  CIR;Supervision/Assistance - 24 hour    Equipment Recommendations  Other (comment) (defer to next venue)    Recommendations for Other Services       Precautions / Restrictions Precautions Precautions: Fall Restrictions Weight Bearing Restrictions: No      Mobility Bed Mobility Overal bed mobility: Needs Assistance Bed Mobility: Supine to Sit;Sit to Supine     Supine to sit: Mod assist;HOB elevated;+2 for safety/equipment Sit to supine: +2 for physical assistance;Mod assist;HOB elevated   General bed mobility comments: assist to raise trunk and advance hips to EOB with pad, assist for L LE and  guided shoulders back to supine, +2 max to pull up in bed with pt assisting by pulling up on headboard.  Transfers                 General transfer comment: nursing using lift equipment    Balance Overall balance assessment: Needs assistance Sitting-balance support: Single extremity supported Sitting balance-Leahy Scale: Poor Sitting balance - Comments: tolerated EOB x  5 minutes                                    ADL Overall ADL's : Needs assistance/impaired Eating/Feeding: Set up;Bed level   Grooming: Wash/dry hands;Wash/dry face;Set up;Brushing hair;Maximal assistance;Sitting;Bed level   Upper Body Bathing: Moderate assistance;Sitting   Lower Body Bathing: Total assistance;Bed level   Upper Body Dressing : Minimal assistance;Sitting   Lower Body Dressing: Total assistance;Bed level       Toileting- Clothing Manipulation and Hygiene: Total assistance;Bed level               Vision     Perception     Praxis      Pertinent Vitals/Pain Pain Assessment: 0-10 Pain Score: 10-Worst pain ever Pain Location: R LE, abdomen Pain Descriptors / Indicators: Guarding;Grimacing;Aching;Constant Pain Intervention(s): Limited activity within patient's tolerance;Monitored during session;Premedicated before session;Repositioned     Hand Dominance Right   Extremity/Trunk Assessment Upper Extremity Assessment Upper Extremity Assessment: Generalized weakness RUE Deficits / Details: 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, moderate edema, generalized weakness RUE Coordination: decreased fine motor LUE Deficits / Details: generalized weakness, moderate edema   Lower Extremity Assessment Lower Extremity Assessment: Defer to PT evaluation       Communication Communication Communication: No difficulties   Cognition Arousal/Alertness: Awake/alert Behavior During Therapy: WFL for tasks assessed/performed Overall Cognitive Status: Within Functional Limits for tasks assessed  General Comments       Exercises       Shoulder Instructions      Home Living Family/patient expects to be discharged to:: Private residence Living Arrangements: Children;Other relatives (3 young grandchildren) Available Help at Discharge: Family;Available 24 hours/day Type of Home: Mobile home Home Access:  Stairs to enter Entrance Stairs-Number of Steps: 4 Entrance Stairs-Rails: Right;Left;Can reach both Home Layout: One level     Bathroom Shower/Tub: Teacher, early years/pre: Standard     Home Equipment: None   Additional Comments: pt is at risk for losing her home, case management is involved      Prior Functioning/Environment Level of Independence: Independent             OT Diagnosis: Generalized weakness;Acute pain   OT Problem List: Decreased activity tolerance;Decreased strength;Decreased range of motion;Impaired balance (sitting and/or standing);Decreased coordination;Decreased knowledge of use of DME or AE;Obesity;Pain;Impaired UE functional use;Increased edema   OT Treatment/Interventions: Self-care/ADL training;Therapeutic exercise;DME and/or AE instruction;Therapeutic activities;Balance training;Patient/family education    OT Goals(Current goals can be found in the care plan section) Acute Rehab OT Goals Patient Stated Goal: return home with family assist OT Goal Formulation: With patient Time For Goal Achievement: 01/12/15 Potential to Achieve Goals: Good  OT Frequency: Min 2X/week   Barriers to D/C:            Co-evaluation PT/OT/SLP Co-Evaluation/Treatment: Yes Reason for Co-Treatment: For patient/therapist safety;Complexity of the patient's impairments (multi-system involvement)   OT goals addressed during session: ADL's and self-care      End of Session    Activity Tolerance: Patient limited by pain Patient left: in bed;with call bell/phone within reach;with family/visitor present   Time: 0160-1093 OT Time Calculation (min): 19 min Charges:  OT General Charges $OT Visit: 1 Procedure OT Evaluation $OT Re-eval: 1 Procedure G-Codes:    Malka So 12/29/2014, 11:11 AM [phone]

## 2014-12-29 NOTE — Progress Notes (Signed)
  Date: 12/29/2014  Patient name: HAROLYN COCKER  Medical record number: [id number]  Date of birth: 1941/07/23   This patient has been seen and the plan of care was discussed with the house staff. Please see their note for complete details. I concur with their findings with the following additions/corrections: Ms Camposano is well today but tired. She c/o phantom pain in her RLL, unchanged ABD pain, and pain behind R knee. She is working with PT / OT. Taking PO inc ensure type drinks. She cont to have pitting edema of UE and LE. Alb is 1.1. Cr stable at 1.06. HgB stable at 9.8. Dr Trudee Kuster will transfer out of step down. Pt appropriate candidate for CIR - await bed in CIR.   Bartholomew Crews, MD 12/29/2014, 11:20 AM

## 2014-12-29 NOTE — Progress Notes (Signed)
ANTICOAGULATION CONSULT NOTE - Follow Up Consult  Pharmacy Consult for Lovenox Indication: atrial fibrillation  No Known Allergies  Patient Measurements: Height: 5\' 11"  (180.3 cm) Weight: 207 lb 14.3 oz (94.3 kg) IBW/kg (Calculated) : 70.8  Vital Signs: Temp: 97.9 F (36.6 C) (05/13 0722) Temp Source: Oral (05/13 0722) BP: 118/61 mmHg (05/13 1100) Pulse Rate: 83 (05/13 0722)  Labs:  Recent Labs  12/27/14 0701 12/28/14 0612 12/28/14 0613 12/28/14 1730 12/29/14 0450  HGB 7.2* 9.5*  --  9.6* 9.8*  HCT 22.9* 29.7*  --  30.5* 30.9*  PLT 401* 391  --  406* 411*  LABPROT  --  15.4*  --   --   --   INR  --  1.21  --   --   --   HEPARINUNFRC 0.39  --  0.10*  --  <0.10*  CREATININE 1.18*  --  1.06*  --  1.06*    Estimated Creatinine Clearance: 59.8 mL/min (by C-G formula based on Cr of 1.06).  Assessment:   Pharmacy had assisted with IV heparin dosing from 5/5 thru 12/28/14 for RLE ischemia, which was likely secondary to thrombus from atrial fibrillation. IV heparin was stopped prior to right BKA on 12/28/14.      Prophylactic Lovenox 30 mg sq q24hrs ordered post-op and given ~8am today.  Now to change to full-dose Lovenox for atrial fibrillation, given malignancy.    Weight 71.1kg on 5/1, but now up to 94.3 kg.  Goal of Therapy:  Anti-Xa level 0.6-1 units/ml 4hrs after LMWH dose given Monitor platelets by anticoagulation protocol: Yes   Plan:   Lovenox 90 mg sq q12hrs to begin this evening.  CBC at least q72hrs. Currently has daily CBC order.  Could possibly consider daily Lovenox (~1.5 mg/kg q24hrs) at some point, if CBC remains stable.  Will follow up weights to be sure dose remains appropriate.  Arty Baumgartner, [place] Pager: [phone] 12/29/2014,11:40 AM

## 2014-12-29 NOTE — Progress Notes (Signed)
Pt transferred per bed with all belongings to [place] by RN and nurse tech. Pt oriented to unit. Given nurse call light

## 2014-12-29 NOTE — Progress Notes (Signed)
   Daily Progress Note  Assessment/Planning: POD #1 s/p R BKA   Dressing off tomorrow  Will need to change daily due to edema in stump  Subjective  - 1 Day Post-Op  Pain controlled  Objective Filed Vitals:   12/29/14 0000 12/29/14 0354 12/29/14 0722 12/29/14 1100  BP: 157/62 152/61 153/71 118/61  Pulse: 70 97 83   Temp: 98 F (36.7 C) 97.8 F (36.6 C) 97.9 F (36.6 C)   TempSrc: Axillary Axillary Oral   Resp: 13 22 28    Height:      Weight:      SpO2: 99% 97% 97%     Intake/Output Summary (Last 24 hours) at 12/29/14 1141 Last data filed at 12/29/14 1104  Gross per 24 hour  Intake 2820.75 ml  Output   2090 ml  Net 730.75 ml    VASC  R BKA bandaged  Laboratory CBC    Component Value Date/Time   WBC 20.3* 12/29/2014 0450   HGB 9.8* 12/29/2014 0450   HCT 30.9* 12/29/2014 0450   PLT 411* 12/29/2014 0450    BMET    Component Value Date/Time   NA 136 12/29/2014 0450   K 3.5 12/29/2014 0450   CL 102 12/29/2014 0450   CO2 26 12/29/2014 0450   GLUCOSE 100* 12/29/2014 0450   BUN 9 12/29/2014 0450   CREATININE 1.06* 12/29/2014 0450   CALCIUM 7.4* 12/29/2014 0450   GFRNONAA 51* 12/29/2014 0450   GFRAA 59* 12/29/2014 0450    Adele Barthel, MD [facility] [place] Office: [phone] Pager: [phone]  12/29/2014, 11:41 AM

## 2014-12-29 NOTE — Telephone Encounter (Signed)
Left message for pt re appt, dpm  ° °

## 2014-12-29 NOTE — Progress Notes (Signed)
Subjective:    The patient says her surgery went well yesterday. Her pain is much improved this morning, but she continues to have a burning sensation in her right lower extremity post amputation. She reports eating dinner yesterday without any issue.  Interval Events: -Afebrile, blood pressure remains mildly elevated this morning.   Objective:    Vital Signs:   Temp:  [97 F (36.1 C)-98.3 F (36.8 C)] 97.9 F (36.6 C) (05/13 0722) Pulse Rate:  [70-97] 83 (05/13 0722) Resp:  [13-29] 28 (05/13 0722) BP: (152-173)/(61-78) 153/71 mmHg (05/13 0722) SpO2:  [92 %-100 %] 97 % (05/13 0722) Last BM Date: 12/28/14  24-hour weight change: Weight change:   Intake/Output:   Intake/Output Summary (Last 24 hours) at 12/29/14 2119 Last data filed at 12/29/14 0600  Gross per 24 hour  Intake 2313.75 ml  Output   1910 ml  Net 403.75 ml      Physical Exam: General: Well-developed, well-nourished, in no acute distress; alert, appropriate and cooperative throughout examination.  Lungs:  Normal respiratory effort. Clear to auscultation BL without crackles or wheezes.  Heart: RRR. S1 and S2 normal without gallop, murmur, or rubs.  Abdomen:  Surgical incisions dressed, clean dry, and intact.  Colostomy bag in RLQ with brown stool.  No tenderness to palpation, bowel sounds present.  Extremities: Status post R BKA with wound dressing in place, nontender. Trace edema in arms and legs, right arm >left arm.   Labs:  Basic Metabolic Panel:  Recent Labs Lab 12/24/14 0315 12/24/14 1718 12/25/14 0458 12/26/14 0230 12/26/14 0820 12/27/14 0701 12/28/14 0613 12/29/14 0450  NA 140  --  139  --  140 139 139 136  K 2.8* 3.2* 2.9*  --  3.5 3.5 3.3* 3.5  CL 102  --  103  --  106 106 106 102  CO2 29  --  28  --  28 27 27 26   GLUCOSE 114*  --  138*  --  128* 118* 114* 100*  BUN 42*  --  32*  --  21* 18 14 9   CREATININE 2.09*  --  1.67*  --  1.28* 1.18* 1.06* 1.06*  CALCIUM 7.1*  --  7.0*  --   7.1* 7.2* 7.2* 7.4*  MG 1.6* 2.2 2.0 1.9  --   --   --   --   PHOS 4.0  --  3.1  --   --   --   --   --     Liver Function Tests:  Recent Labs Lab 12/24/14 0315 12/25/14 0458  AST 69* 49*  ALT 45 34  ALKPHOS 89 86  BILITOT 0.6 0.4  PROT 4.2* 4.1*  ALBUMIN 1.1* 1.1*   CBC:  Recent Labs Lab 12/24/14 0315 12/25/14 0458 12/26/14 0230 12/27/14 0701 12/28/14 0612 12/28/14 1730 12/29/14 0450  WBC 16.7* 20.4* 23.5* 20.2* 21.2* 22.5* 20.3*  NEUTROABS 14.4* 17.8*  --   --   --   --   --   HGB 8.8* 8.1* 7.8* 7.2* 9.5* 9.6* 9.8*  HCT 26.6* 25.4* 24.6* 22.9* 29.7* 30.5* 30.9*  MCV 78.5 79.9 79.4 81.2 81.8 82.0 82.8  PLT 227 297 345 401* 391 406* 411*   CBG:  Recent Labs Lab 12/28/14 1721 12/28/14 1936 12/29/14 0038 12/29/14 0355 12/29/14 0721  GLUCAP 117* 131* 129* 102* 142*    Microbiology: Results for orders placed or performed during the hospital encounter of 12/16/14  Surgical pcr screen     Status: None  Collection Time: 12/17/14  6:20 AM  Result Value Ref Range Status   MRSA, PCR NEGATIVE NEGATIVE Final   Staphylococcus aureus NEGATIVE NEGATIVE Final    Comment:        The Xpert SA Assay (FDA approved for NASAL specimens in patients over 70 years of age), is one component of a comprehensive surveillance program.  Test performance has been validated by [facility] for patients greater than or equal to 67 year old. It is not intended to diagnose infection nor to guide or monitor treatment.   Surgical pcr screen     Status: None   Collection Time: 12/17/14 11:45 AM  Result Value Ref Range Status   MRSA, PCR NEGATIVE NEGATIVE Final   Staphylococcus aureus NEGATIVE NEGATIVE Final    Comment:        The Xpert SA Assay (FDA approved for NASAL specimens in patients over 65 years of age), is one component of a comprehensive surveillance program.  Test performance has been validated by [facility] for patients greater than or equal to 48 year old. It  is not intended to diagnose infection nor to guide or monitor treatment.   Culture, blood (routine x 2)     Status: None   Collection Time: 12/17/14  4:25 PM  Result Value Ref Range Status   Specimen Description BLOOD LEFT WRIST  Final   Special Requests BOTTLES DRAWN AEROBIC ONLY 1CC  Final   Culture   Final    NO GROWTH 5 DAYS Note: Culture results may be compromised due to an inadequate volume of blood received in culture bottles. Performed at [facility]    Report Status 12/23/2014 FINAL  Final  Culture, blood (routine x 2)     Status: None   Collection Time: 12/17/14  4:28 PM  Result Value Ref Range Status   Specimen Description BLOOD CENTRAL LINE  Final   Special Requests BOTTLES DRAWN AEROBIC ONLY 10CC  Final   Culture   Final    NO GROWTH 5 DAYS Performed at [facility]    Report Status 12/23/2014 FINAL  Final   Imaging: No results found.     Medications:    Infusions: . sodium chloride Stopped (12/23/14 1800)  . dextrose 5 % and 0.45 % NaCl with KCl 20 mEq/L 75 mL/hr at 12/29/14 0607  . dextrose 5 % and 0.9% NaCl 75 mL/hr at 12/28/14 0300  . lactated ringers 50 mL/hr at 12/28/14 1101    Scheduled Medications: . antiseptic oral rinse  7 mL Mouth Rinse q12n4p  . cefUROXime (ZINACEF)  IV  1.5 g Intravenous Q12H  . chlorhexidine  15 mL Mouth Rinse BID  . docusate sodium  100 mg Oral Daily  . enoxaparin (LOVENOX) injection  30 mg Subcutaneous Q24H  . feeding supplement (ENSURE ENLIVE)  237 mL Oral BID BM  . FLUoxetine  20 mg Oral Daily  . insulin aspart  0-9 Units Subcutaneous 6 times per day  . pantoprazole  40 mg Oral Daily    PRN Medications: acetaminophen **OR** acetaminophen, albuterol, alum & mag hydroxide-simeth, fentaNYL (SUBLIMAZE) injection, guaiFENesin-dextromethorphan, hydrALAZINE, hydrALAZINE, labetalol, magnesium sulfate 1 - 4 g bolus IVPB, metoprolol, ondansetron, oxyCODONE-acetaminophen, phenol, potassium  chloride   Assessment/ Plan:    Principal Problem:   Septic shock Active Problems:   Bowel obstruction   Colonic mass   S/P partial colectomy   Severe sepsis with acute organ dysfunction   Noncompliance with medication regimen   Acute respiratory failure  with hypoxia   Thrombus   Acute renal failure syndrome   Peritonitis   Colon cancer metastasized to multiple sites   Acute blood loss anemia   Melena   Diabetes type 2, controlled   Hypokalemia   Hypomagnesemia   Depression   Abdominal pain   Pain in joint, lower leg   Palliative care encounter  [phone] (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 -Outpatient oncology follow-up. -Carb modified diet. -D5 half normal saline at 75 mL per hour. Can discontinue if good by mouth intake. -Continue Percocet 5-325, 1-2 tabs every 4 hours as needed. -Continue fentanyl 12.5-25 g IV every 2 hours needed. -Continue work with PT. -OT consulted. -CIR consulted.  #Status post right BKA for ischemic right foot secondary to thrombus from atrial fibrillation Tolerated surgery well yesterday. She has some burning in her right leg concerning for phantom limb pain. Hopefully, this will improve with physical therapy and rehabilitation. -We will discuss resuming anticoagulation with vascular surgery. -Indefinite anticoagulation with Lovenox given malignancy will need to be resumed postsurgery. -Continue to monitor on telemetry. -Pain management as above.  #Septic shock secondary to GI perforation No signs of infection currently. White blood cell count trending down. -Continue to monitor for fever. -Trend white blood cell count post right BKA.  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  #Acute blood loss anemia Hemoglobin stable the  morning. -Continue to monitor hemoglobin.  #Hypertension Blood pressure remains slightly elevated. -Restart amlodipine 5 mg daily. -Continue hydralazine 10 mg when necessary for SBP greater than 160.  #Acute renal failure Creatinine stable. -Continue IV fluids as above.  #Type 2 diabetes Blood sugar control remains good, receiving minimal insulin per sliding scale -Continue CBGs and sliding scale insulin sensitive ACHS.  #Indigestion -Maalox every 2 hours when necessary.  #Depression -Continue Prozac 20 mg daily.   DVT PPX - heparin  CODE STATUS - DNR  CONSULTS PLACED - PCCM, Oncology, General Surgery, Vascular Surgery, GI, palliative care.  DISPO - Disposition is deferred at this time, awaiting improvement of ischemic foot.   Anticipated discharge in approximately 2-4 day(s).   The patient does not have a current PCP (No PCP Per Patient) and does need an [facility] hospital follow-up appointment after discharge.    Is the [facility] hospital follow-up appointment a one-time only appointment? yes.  Does the patient have transportation limitations that hinder transportation to clinic appointments? yes   SERVICE NEEDED AT [place]         Y = Yes, Blank = No PT: CIR  OT: CIR  RN:   Equipment:   Other:      Length of Stay: 13 day(s)   Signed: Charlesetta Shanks, MD  PGY-1, Internal Medicine Resident Pager: [phone] (7AM-5PM) 12/29/2014, 8:22 AM

## 2014-12-29 NOTE — Progress Notes (Addendum)
Admission note:  Arrival Method: Patient came to unit on bed from [facility] with staff accompanying. Mental Orientation:  Alert and oriented x 4. Telemetry: N/A Assessment: See doc flow sheets. Skin: Assessed with another nurse, Mid line Surgical incision with dressing noted on the abdomen, compression wrap noted on the lower right leg (BKA), swelling noted in the right and upper extremity and also in the left lower extremity.  Deep tissue injury noted purplish color at the sacrum, stage 2 noted on the right buttock, and also excoriated area noted 1 inch above stage 2.  Foam dressing changed.  IV: right internal jugular triple lumen present. Pain: C/o pain in the right leg, administered pain medicine. Tubes: Foley present, patent and draining clear yellow urine. Safety Measures: Bed in low position, call light and phone within reach. Fall Prevention Safety Plan: Reviewed the plan with the patient, understood and acknowledged. Admission Screening: Completed. 6700 Orientation: Patient has been oriented to the unit, staff and to the room.

## 2014-12-29 NOTE — Progress Notes (Signed)
[facility] Progress Note  1 Day Post-Op  Subjective: Pt feels good, much more comfortable since R BKA.  Pain minimal in abdomen.  Good flatus and BM in bag.  No N/V, tolerating diet well.  No other complaints.  Objective: Vital signs in last 24 hours: Temp:  [97 F (36.1 C)-98.3 F (36.8 C)] 97.9 F (36.6 C) (05/13 0722) Pulse Rate:  [70-97] 83 (05/13 0722) Resp:  [13-29] 28 (05/13 0722) BP: (152-173)/(61-78) 153/71 mmHg (05/13 0722) SpO2:  [92 %-100 %] 97 % (05/13 0722) Last BM Date: 12/28/14  Intake/Output from previous day: 05/12 0701 - 05/13 0700 In: 2402.8 [I.V.:2352.8; IV Piggyback:50] Out: 2210 [YCXKG:8185; Drains:10; Stool:200; Blood:50] Intake/Output this shift:    PE: Gen:  Alert, NAD, pleasant Abd: Soft, NT/ND, +BS, no HSM, midline wound clean with some slough, drain with serosanguinous drainage (77mL), ostomy pink with brown soft stools  Lab Results:   Recent Labs  12/28/14 1730 12/29/14 0450  WBC 22.5* 20.3*  HGB 9.6* 9.8*  HCT 30.5* 30.9*  PLT 406* 411*   BMET  Recent Labs  12/28/14 0613 12/29/14 0450  NA 139 136  K 3.3* 3.5  CL 106 102  CO2 27 26  GLUCOSE 114* 100*  BUN 14 9  CREATININE 1.06* 1.06*  CALCIUM 7.2* 7.4*   PT/INR  Recent Labs  12/28/14 0612  LABPROT 15.4*  INR 1.21   CMP     Component Value Date/Time   NA 136 12/29/2014 0450   K 3.5 12/29/2014 0450   CL 102 12/29/2014 0450   CO2 26 12/29/2014 0450   GLUCOSE 100* 12/29/2014 0450   BUN 9 12/29/2014 0450   CREATININE 1.06* 12/29/2014 0450   CALCIUM 7.4* 12/29/2014 0450   PROT 4.1* 12/25/2014 0458   ALBUMIN 1.1* 12/25/2014 0458   AST 49* 12/25/2014 0458   ALT 34 12/25/2014 0458   ALKPHOS 86 12/25/2014 0458   BILITOT 0.4 12/25/2014 0458   GFRNONAA 51* 12/29/2014 0450   GFRAA 59* 12/29/2014 0450   Lipase     Component Value Date/Time   LIPASE 15 12/16/2014 1004       Studies/Results: No results found.  Anti-infectives: Anti-infectives     Start     Dose/Rate Route Frequency Ordered Stop   12/29/14 0000  cefUROXime (ZINACEF) 1.5 g in dextrose 5 % 50 mL IVPB     1.5 g 100 mL/hr over 30 Minutes Intravenous Every 12 hours 12/28/14 1503 12/29/14 2359   12/28/14 0800  cefUROXime (ZINACEF) 1.5 g in dextrose 5 % 50 mL IVPB     1.5 g 100 mL/hr over 30 Minutes Intravenous On call to O.R. 12/25/14 1324 12/28/14 1259   12/22/14 1000  piperacillin-tazobactam (ZOSYN) IVPB 2.25 g     2.25 g 100 mL/hr over 30 Minutes Intravenous Every 6 hours 12/22/14 0949 12/23/14 2230   12/19/14 1600  fluconazole (DIFLUCAN) IVPB 200 mg     200 mg 100 mL/hr over 60 Minutes Intravenous Every 24 hours 12/19/14 0901 12/23/14 1600   12/18/14 1100  fluconazole (DIFLUCAN) IVPB 400 mg  Status:  Discontinued     400 mg 100 mL/hr over 120 Minutes Intravenous Every 24 hours 12/18/14 1036 12/19/14 0901   12/17/14 1100  piperacillin-tazobactam (ZOSYN) IVPB 3.375 g  Status:  Discontinued     3.375 g 12.5 mL/hr over 240 Minutes Intravenous Every 8 hours 12/17/14 0956 12/22/14 0911   12/17/14 0600  cefoTEtan (CEFOTAN) 2 g in dextrose 5 % 50 mL IVPB  2 g 100 mL/hr over 30 Minutes Intravenous To Surgery 12/17/14 0542 12/17/14 9179       Assessment/Plan Gastric/colon Adenocarcinoma with mets Perforation of cecum, large obstructing tumor at the splenic flexure with peritoneal metastasis POD#12 exploratory laparotomy right, transverse, partial 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 Grandville Silos -WOC following for ostomy teaching -BID wet to dry dressing changes to midline wound -Drain care, will discuss with Dr. Barry Dienes about removing drain since only 79mL -Path-adeno with mets. Hem/onc consulted -On carb mod diet, NPO for surgery -CT on 12/25/14 shows loculated ascites likely secondary to peritoneal metastasis, no plans for additional drainage currently  ID-zosyn since 5/1---> fluconazole 5/2 WBC up, afebrile.  Ischemic right foot  - Dr. Bridgett Larsson POD #1 s/p Right BKA Leukocytosis - 20.3 - would hope this would continue to improve VTE prophylaxis-SCD/heparin, mobilize FEN-On carb mod diet Disp - PT recommending CIR after BKA  We will make her PRN for the weekend, call as needed for questions/concerns.  Follow up with Dr. Grandville Silos after discharge from CIR.  Post-op instructions in discharge section of epic.      LOS: 13 days    DORT, Jinny Blossom 12/29/2014, 7:58 AM Pager: [phone]

## 2014-12-29 NOTE — PMR Pre-admission (Signed)
PMR Admission Coordinator Pre-Admission Assessment  Patient: Kathryn Knapp is an 74 y.o., female MRN: [id number] DOB: 15-Aug-1941 Height: 5\' 11"  (180.3 cm) Weight: 88.1 kg (194 lb 3.6 oz)              Insurance Information  PRIMARY: Medicare Part A only Policy#: [id number] d Subscriber: self Pre-Cert#: verified in [place]  Employer: not working [employer] Date: A: 09/18/06 Deduct: $1288 Out of Pocket YKZ:LDJT Life Max: unlimited CIR: 100% SNF: 100% days 1-20; 80% days 21-100 (100 days max) Outpatient: no benefit Co-Pay:  Home Health: 100% Co-Pay: none, no visit limits DME: no benefit Co-Pay:  Providers: pt's preference  Note: pt has also started the [facility] application process.  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 right handed female with history of hypertension as well as diabetes mellitus. Independent prior to admission living with her daughter and grandchildren. Presented 12/16/2014 with nonspecific abdominal pain as well as nausea vomiting. He also had some recent weight loss. CT abdomen revealed a mass in the splenic flexure as well as nodule left adrenal gland. Abdominal pain progressed follow-up scan revealed some free fluid and air. Underwent exploratory laparotomy right transverse and partial left colectomy with en block partial gastrectomy, and ileostomy 12/17/2014 per Dr. Grandville Silos. Pathology report adenocarcinoma with metastases and await plan of care. Patient required ventilatory management followed by critical care postoperatively. Wound  care nurse consulted for sacral decubitus with dressing change recommendations. She was extubated 12/21/2014 with slow progress. Palliative care consulted 12/27/2014. 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 Bridgett Larsson. Acute blood loss anemia 7.2-9.6 and monitored. Physical and occupational therapy evaluations are pending. M.D. has requested physical medicine rehabilitation consult.   Past Medical History  Past Medical History  Diagnosis Date  . Diabetes mellitus without complication   . Hypertension     Family History  family history is not on file.  Prior Rehab/Hospitalizations:  none   Current Medications   Current facility-administered medications:  .  0.9 %  sodium chloride infusion, , Intravenous, Continuous, Wilford Corner, MD, Last Rate: 20 mL/hr at 12/29/14 1511, 20 mL/hr at 12/29/14 1511 .  acetaminophen (TYLENOL) tablet 325-650 mg, 325-650 mg, Oral, Q4H PRN **OR** acetaminophen (TYLENOL) suppository 325-650 mg, 325-650 mg, Rectal, Q4H PRN, Ulyses Amor, PA-C .  albuterol (PROVENTIL) (2.5 MG/3ML) 0.083% nebulizer solution 2.5 mg, 2.5 mg, Nebulization, Q4H PRN, Marijean Heath, NP .  alum & mag hydroxide-simeth (MAALOX/MYLANTA) 200-200-20 MG/5ML suspension 15-30 mL, 15-30 mL, Oral, Q2H PRN, Ulyses Amor, PA-C .  amLODipine (NORVASC) tablet 5 mg, 5 mg, Oral, Daily, Langley Gauss Moding, MD, 5 mg at 12/31/14 1016 .  antiseptic oral rinse (CPC / CETYLPYRIDINIUM CHLORIDE 0.05%) solution 7 mL, 7 mL, Mouth Rinse, q12n4p, Juanito Doom, MD, 7 mL at 12/31/14 1600 .  chlorhexidine (PERIDEX) 0.12 % solution 15 mL, 15 mL, Mouth Rinse, BID, Juanito Doom, MD, 15 mL at 12/31/14 2000 .  docusate sodium (COLACE) capsule 100 mg, 100 mg, Oral, Daily, Susette Racer  Collins, PA-C, 100 mg at 12/31/14 1016 .  enoxaparin (LOVENOX) injection 130 mg, 130 mg,  Subcutaneous, Q24H, Cassie L Stewart, RPH, 130 mg at 12/31/14 1021 .  feeding supplement (ENSURE ENLIVE) (ENSURE ENLIVE) liquid 237 mL, 237 mL, Oral, BID BM, Mellody Memos Lamberton, RD, 237 mL at 12/31/14 1400 .  fentaNYL (SUBLIMAZE) injection 12.5-25 mcg, 12.5-25 mcg, Intravenous, Q2H PRN, Juanito Doom, MD, 25 mcg at 01/01/15 [phone] .  FLUoxetine (PROZAC) 20 MG/5ML solution 20 mg, 20 mg, Oral, Daily, Allie Bossier, MD, 20 mg at 12/30/14 1100 .  gabapentin (NEURONTIN) capsule 100 mg, 100 mg, Oral, TID, Langley Gauss Moding, MD, 100 mg at 12/31/14 2118 .  guaiFENesin-dextromethorphan (ROBITUSSIN DM) 100-10 MG/5ML syrup 15 mL, 15 mL, Oral, Q4H PRN, Ulyses Amor, PA-C .  hydrALAZINE (APRESOLINE) injection 10 mg, 10 mg, Intravenous, Q4H PRN, Langley Gauss Moding, MD .  insulin aspart (novoLOG) injection 0-9 Units, 0-9 Units, Subcutaneous, 6 times per day, Juanito Doom, MD, Stopped at 12/31/14 2000 .  labetalol (NORMODYNE,TRANDATE) injection 10 mg, 10 mg, Intravenous, Q10 min PRN, Ulyses Amor, PA-C .  magnesium sulfate IVPB 2 g 50 mL, 2 g, Intravenous, Daily PRN, Ulyses Amor, PA-C .  metoprolol (LOPRESSOR) injection 2-5 mg, 2-5 mg, Intravenous, Q2H PRN, Ulyses Amor, PA-C .  ondansetron [facility]) injection 4 mg, 4 mg, Intravenous, Q6H PRN, Ulyses Amor, PA-C .  oxyCODONE-acetaminophen (PERCOCET/ROXICET) 5-325 MG per tablet 1-2 tablet, 1-2 tablet, Oral, Q4H PRN, Erby Pian, NP, 2 tablet at 12/31/14 1611 .  pantoprazole (PROTONIX) EC tablet 40 mg, 40 mg, Oral, Daily, Ulyses Amor, PA-C, 40 mg at 12/31/14 1016 .  phenol (CHLORASEPTIC) mouth spray 1 spray, 1 spray, Mouth/Throat, PRN, Ulyses Amor, PA-C .  potassium chloride SA (K-DUR,KLOR-CON) CR tablet 20-40 mEq, 20-40 mEq, Oral, Daily PRN, Ulyses Amor, PA-C .  sodium chloride 0.9 % injection 10-40 mL, 10-40 mL, Intracatheter, PRN, Charlesetta Shanks, MD  Patients Current Diet: Diet Carb Modified Fluid consistency:: Thin; Room service  appropriate?: Yes  Precautions / Restrictions Precautions Precautions: Fall Restrictions Weight Bearing Restrictions: No   Prior Activity Level Community (5-7x/wk): Pt was active, got out everyday and walked back and forth to the bus stop to pick up her grandchildren.    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   RLE: Unable to fully assess due to pain RLE Sensation: decreased light touch LLE Deficits / Details: AAROM WFL; hip/knee extension 3+ to 4/5    ADLs  Overall ADL's : Needs assistance/impaired Eating/Feeding: Set up, Bed level Grooming: Wash/dry hands, Wash/dry face, Set up, Brushing hair, Maximal assistance, Sitting, Bed level Upper Body Bathing: Moderate assistance, Sitting Lower Body Bathing: Total assistance, Bed level Upper Body Dressing : Minimal assistance, Sitting Lower Body Dressing: Total assistance, Bed level Toilet Transfer Details (indicate cue type and reason): assisted to roll to R side for bedpan Toileting- Clothing Manipulation and  Hygiene: Total assistance, Bed level    Mobility  Overal bed mobility: Needs Assistance Bed Mobility: Supine to Sit, Sit to Supine Rolling: Min assist Supine to sit: Mod assist, HOB  elevated, +2 for safety/equipment Sit to supine: +2 for physical assistance, Mod assist, HOB elevated General bed mobility comments: assist to raise trunk and advance hips to EOB with pad, assist for L LE and  guided shoulders back to supine, +2 max to pull up in bed with pt assisting by pulling up on headboard.    Transfers  Overall transfer level: Needs assistance Equipment used:  (requires lift equipment) General transfer comment: nursing using lift equipment    Ambulation / Gait / Stairs / Wheelchair Mobility   not assessed yet, anticipate needs    Posture / Balance Dynamic Sitting Balance Sitting balance - Comments: tolerated EOB x 5 minutes.  Pt worked on extending right LE to full extension while sitting EOB.  Also worked on flexion.  Needed min guard to min assist.  Leans posterior at times.  Used UE support heavily at times.   Balance Overall balance assessment: Needs assistance, History of Falls Sitting-balance support: Single extremity supported (foot supported) Sitting balance-Leahy Scale: Poor Sitting balance - Comments: tolerated EOB x 5 minutes.  Pt worked on extending right LE to full extension while sitting EOB.  Also worked on flexion.  Needed min guard to min assist.  Leans posterior at times.  Used UE support heavily at times.      Special needs/care consideration BiPAP/CPAP no CPM no  Continuous Drip IV no  Dialysis no         Life Vest no  Oxygen - currently on 2.5 L O2 by nasal cannula  Special Bed no  Trach Size no  Wound Vac (area) no       Skin - current R BKA incision and L colostomy                              Bowel mgmt: last BM on 12-29-14 Bladder mgmt: using bedpan Diabetic mgmt - pt reports that she stopped taking her DM medications in 11-28-2003 after her son died. "It came back with a vengeance."    Previous Home Environment Living Arrangements: Children, Other relatives (3 young grandchildren) Available Help at Discharge: Family, Available 24  hours/day Type of Home: Mobile home Home Layout: One level Home Access: Stairs to enter Entrance Stairs-Rails: Right, Left, Can reach both Entrance Stairs-Number of Steps: 4 Bathroom Shower/Tub: Chiropodist: Standard Home Care Services: No Additional Comments: pt is at risk for losing her home, case management is involved (acute care social worker's note reports that pt stated her landlord is allowing her and her family to stay due to her current medical issues)  Discharge Living Setting Plans for Discharge Living Setting: Patient's home, Mobile Home Type of Home at Discharge: Mobile home Discharge Home Layout: One level Discharge Home Access: Stairs to enter Entrance Stairs-Rails: Right, Left, Can reach both Entrance Stairs-Number of Steps: 4 Discharge Bathroom Shower/Tub: Tub/shower unit Discharge Bathroom Toilet: Standard Does the patient have any problems obtaining your medications?: No  Social/Family/Support Systems Patient Roles: Parent (involved grandmother to 3 school aged children) Contact Information: see above, dtrs are primary contacts Anticipated Caregiver: pt lives with dtr named [name] and [name] is not currently working. Anticipated Caregiver's Contact Information: see above Ability/Limitations of Caregiver: no limitations Caregiver Availability: 24/7 Discharge Plan Discussed with  Primary Caregiver: Yes Is Caregiver In Agreement with Plan?: Yes Does Caregiver/Family have Issues with Lodging/Transportation while Pt is in Rehab?: No  Goals/Additional Needs Patient/Family Goal for Rehab: Supervision and Min assist with PT/OT; NA for SLP Expected length of stay: 15-23 days Cultural Considerations: none Dietary Needs: carb modified Equipment Needs: to be determined Pt/Family Agrees to Admission and willing to participate: Yes (spoke with pt and dtr/grand-dtr on 5-13) Program Orientation Provided & Reviewed with Pt/Caregiver Including Roles  &  Responsibilities: Yes   Decrease burden of Care through IP rehab admission: NA  Possible need for SNF placement upon discharge: not anticipated  Patient Condition: This patient's medical and functional status has changed since the consult dated: 12-29-14 in which the Rehabilitation Physician determined and documented that the patient's condition is appropriate for intensive rehabilitative care in an inpatient rehabilitation facility. See "History of Present Illness" (above) for medical update. Functional changes are: moderate assistance of 2 for limited transfers and moderate to total assistance with self care skills. Patient's medical and functional status update has been discussed with the Rehabilitation physician and patient remains appropriate for inpatient rehabilitation. Will admit to inpatient rehab today.  Preadmission Screen Completed By:  Nanetta Batty, PT, 01/01/2015 9:50 AM ______________________________________________________________________   Discussed status with Dr. Naaman Plummer on 01-01-15 at [phone] and received telephone approval for admission today.  Admission Coordinator: Nanetta Batty, [place], time0949/Date5-16-16

## 2014-12-29 NOTE — Progress Notes (Signed)
D/C JP drain area clean and dry no drainage covered with sterile  4x4's

## 2014-12-29 NOTE — Progress Notes (Signed)
Rehab admissions - I met with pt and family in follow up to rehab MD consult. Further information was given about our rehab program and informational brochures were given. Pt and her family are interested in pursuing inpatient rehab.   Pt is POD #1 today and we will see how pt progresses medically and with therapy over the weekend. We will check on pt's status on Monday and consider possible inpatient rehab admit on Monday pending her medical clearance and our bed availability.  I updated case Freight forwarder and Eliezer Lofts, Education officer, museum. Thanks.  Nanetta Batty, PT Rehabilitation Admissions Coordinator [phone]

## 2014-12-29 NOTE — Evaluation (Signed)
Physical Therapy Re-Evaluation Patient Details Name: Kathryn Knapp MRN: [id number] DOB: [birth year] Today's Date: 12/29/2014   History of Present Illness  Adm 12/16/14 with abd pain; found to have colon Ca with mets; Surgery 5/1 exp lap, partial colectomy with ileostomy r/t perforated bowel; dx metastatic colon adenocarcinoma to stomach, mesentery, lymph nodes; acute respiratory failure -resolved - intubated 5/1-5/5. Developed ischemic Rt foot; R BKA 12/28/14 PMHx- DM, HTN  Clinical Impression  Pt admitted with above diagnosis. Pt currently with functional limitations due to the deficits listed below (see PT Problem List). Re-evaluation post surgery today with goals revised as needed.  Pt able to sit EOB for 5 minutes with good sitting balance.  Moving right LE well.  Great Rehab candidate and pt anxious to go.  Will follow acutely.  Pt will benefit from skilled PT to increase their independence and safety with mobility to allow discharge to the venue listed below.      Follow Up Recommendations CIR;Supervision/Assistance - 24 hour    Equipment Recommendations  Other (comment) (TBA)    Recommendations for Other Services Rehab consult     Precautions / Restrictions Precautions Precautions: Fall Restrictions Weight Bearing Restrictions: No      Mobility  Bed Mobility Overal bed mobility: Needs Assistance Bed Mobility: Supine to Sit;Sit to Supine Rolling: Min assist   Supine to sit: Mod assist;HOB elevated;+2 for safety/equipment Sit to supine: +2 for physical assistance;Mod assist;HOB elevated   General bed mobility comments: assist to raise trunk and advance hips to EOB with pad, assist for L LE and  guided shoulders back to supine, +2 max to pull up in bed with pt assisting by pulling up on headboard.  Transfers Overall transfer level: Needs assistance Equipment used:  (requires lift equipment)             General transfer comment: nursing using lift  equipment  Ambulation/Gait                Stairs            Wheelchair Mobility    Modified Rankin (Stroke Patients Only)       Balance Overall balance assessment: Needs assistance;History of Falls Sitting-balance support: Single extremity supported (foot supported) Sitting balance-Leahy Scale: Poor Sitting balance - Comments: tolerated EOB x 5 minutes.  Pt worked on extending right LE to full extension while sitting EOB.  Also worked on flexion.  Needed min guard to min assist.  Leans posterior at times.  Used UE support heavily at times.                                       Pertinent Vitals/Pain Pain Assessment: 0-10 Pain Score: 10-Worst pain ever Pain Location: right LE, abdomen Pain Descriptors / Indicators: Guarding;Grimacing;Aching;Constant (phantom pain noted per pt) Pain Intervention(s): Limited activity within patient's tolerance;Monitored during session;Premedicated before session;Repositioned  O2 on RA 85% when removed for sit to supine.  Replaced O2 at 2L and sats 94% and greater.      Home Living Family/patient expects to be discharged to:: Private residence Living Arrangements: Children;Other relatives (3 young grandchildren) Available Help at Discharge: Family;Available 24 hours/day Type of Home: Mobile home Home Access: Stairs to enter Entrance Stairs-Rails: Right;Left;Can reach both Entrance Stairs-Number of Steps: 4 Home Layout: One level Home Equipment: None Additional Comments: pt is at risk for losing her home, case management is involved  Prior Function Level of Independence: Independent               Hand Dominance   Dominant Hand: Right    Extremity/Trunk Assessment   Upper Extremity Assessment: Defer to OT evaluation RUE Deficits / Details: 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   LLE Deficits / Details: AAROM WFL; hip/knee extension 3+ to 4/5  Cervical / Trunk Assessment: Kyphotic  Communication   Communication: No difficulties  Cognition Arousal/Alertness: Awake/alert Behavior During Therapy: WFL for tasks assessed/performed Overall Cognitive Status: Within Functional Limits for tasks assessed                      General Comments      Exercises General Exercises - Lower Extremity Ankle Circles/Pumps: AROM;AAROM;Left;10 reps Quad Sets: AROM;Both;10 reps Long Arc Quad: AROM;Right;10 reps;Seated Other Exercises Other Exercises: Pt instructed to perform quad sets all day long.  Placed towel under residual limb to incr stretch.        Assessment/Plan    PT Assessment Patient needs continued PT services  PT Diagnosis Acute pain;Generalized weakness   PT Problem List Decreased strength;Decreased range of motion;Decreased activity tolerance;Decreased balance;Decreased mobility;Decreased knowledge of use of DME;Decreased knowledge of precautions;Impaired sensation;Pain  PT Treatment Interventions DME instruction;Functional mobility training;Therapeutic activities;Therapeutic exercise;Balance training;Neuromuscular re-education;Patient/family education;Wheelchair mobility training   PT Goals (Current goals can be found in the Care Plan section) Acute Rehab PT Goals Patient Stated Goal: return home with family assist PT Goal Formulation: With patient Time For Goal Achievement: 01/12/15 Potential to Achieve Goals: Good    Frequency Min 3X/week   Barriers to discharge        Co-evaluation PT/OT/SLP Co-Evaluation/Treatment: Yes Reason for Co-Treatment: For patient/therapist safety PT goals addressed during session: Mobility/safety with mobility OT goals addressed during session: ADL's and self-care        End of Session Equipment Utilized During Treatment: Oxygen Activity Tolerance: Patient limited by fatigue Patient left: in bed;with call bell/phone within reach;with family/visitor present;with bed alarm set Nurse Communication: Mobility status;Need for lift equipment         Time: 8850-2774 PT Time Calculation (min) (ACUTE ONLY): 19 min   Charges:   PT Evaluation $PT Re-evaluation: 1 Procedure     PT G CodesIrwin Brakeman F 2014/12/30, 11:39 AM Amanda Cockayne Acute Rehabilitation [phone] (pager)

## 2014-12-29 NOTE — Clinical Social Work Note (Signed)
Clinical Social Work Assessment  Patient Details  Name: Kathryn Knapp MRN: [id number] Date of Birth: [birth year]  Date of referral:  12/29/14               Reason for consult:  Housing Concerns/Homelessness, Facility Placement                Permission sought to share information with:  Chartered certified accountant granted to share information::  Yes, Verbal Permission Granted  Name::        Agency::  [facility]  Relationship::     Contact Information:     Housing/Transportation Living arrangements for the past 2 months:  Single Family Home Source of Information:  Patient Patient Interpreter Needed:  None Criminal Activity/Legal Involvement Pertinent to Current Situation/Hospitalization:  No - Comment as needed Significant Relationships:  Adult Children Lives with:  Adult Children, Relatives Do you feel safe going back to the place where you live?    Need for family participation in patient care:  Yes (Comment)  Care giving concerns: patient lives at home with her daughter and granddaughter who patient reports are capable of helping her- per RN report there are some concerns about patient and family ability to understand medical issues/ treatment needs at home   Social Worker assessment / plan:  CSW spoke with patient about SNF in case CIR unable to admit- patient is agreeable to SNF if insurance would be paying.  CSW also spoke with patient concerning housing issues.  Per RN report pt and family were evicted from their home during this admission- pt daughter and granddaughter are sleeping in the pt room.  Patient reports that her land lord has delayed the eviction due to the patients medical condition and that the patient would have a place to return to at time of DC.  Employment status:  Retired Forensic scientist:  Medicare PT Recommendations:  Inpatient [place] / Referral to community resources:  [place]  Patient/Family's Response to care:  Patient is agreeable to SNF stay if unable to go to CIR.  Patient/Family's Understanding of and Emotional Response to Diagnosis, Current Treatment, and Prognosis:  Unclear- RN suspects poor understanding  Emotional Assessment Appearance:  Appears stated age Attitude/Demeanor/Rapport:    Affect (typically observed):  Calm, Appropriate Orientation:  Oriented to Self, Oriented to Place, Oriented to  Time, Oriented to Situation Alcohol / Substance use:  Tobacco Use Psych involvement (Current and /or in the community):  No (Comment)  Discharge Needs  Concerns to be addressed:  Home Safety Concerns, Homelessness Readmission within the last 30 days:  No Current discharge risk:  Physical Impairment Barriers to Discharge:  Continued Medical Work up   [employer], LCSW 12/29/2014, 1:12 PM

## 2014-12-30 LAB — BASIC METABOLIC PANEL
Anion gap: 7 (ref 5–15)
BUN: 11 mg/dL (ref 6–20)
CALCIUM: 7.4 mg/dL — AB (ref 8.9–10.3)
CO2: 26 mmol/L (ref 22–32)
Chloride: 103 mmol/L (ref 101–111)
Creatinine, Ser: 1.08 mg/dL — ABNORMAL HIGH (ref 0.44–1.00)
GFR calc non Af Amer: 50 mL/min — ABNORMAL LOW (ref >60)
GFR, EST AFRICAN AMERICAN: 58 mL/min — AB (ref >60)
GLUCOSE: 104 mg/dL — AB (ref 65–99)
Potassium: 3.6 mmol/L (ref 3.5–5.1)
Sodium: 136 mmol/L (ref 135–145)

## 2014-12-30 LAB — GLUCOSE, CAPILLARY
GLUCOSE-CAPILLARY: 92 mg/dL (ref 65–99)
GLUCOSE-CAPILLARY: 108 mg/dL — AB (ref 65–99)
GLUCOSE-CAPILLARY: 140 mg/dL — AB (ref 65–99)
Glucose-Capillary: 107 mg/dL — ABNORMAL HIGH (ref 65–99)
Glucose-Capillary: 108 mg/dL — ABNORMAL HIGH (ref 65–99)
Glucose-Capillary: 141 mg/dL — ABNORMAL HIGH (ref 65–99)

## 2014-12-30 LAB — CBC
HEMATOCRIT: 30.3 % — AB (ref 36.0–46.0)
Hemoglobin: 9.4 g/dL — ABNORMAL LOW (ref 12.0–15.0)
MCH: 26 pg (ref 26.0–34.0)
MCHC: 31 g/dL (ref 30.0–36.0)
MCV: 83.7 fL (ref 78.0–100.0)
PLATELETS: 406 10*3/uL — AB (ref 150–400)
RBC: 3.62 MIL/uL — AB (ref 3.87–5.11)
RDW: 17.9 % — ABNORMAL HIGH (ref 11.5–15.5)
WBC: 20.5 10*3/uL — AB (ref 4.0–10.5)

## 2014-12-30 MED ORDER — SODIUM CHLORIDE 0.9 % IJ SOLN: 10.0000 mL | INTRAMUSCULAR | Status: DC | PRN

## 2014-12-30 NOTE — Progress Notes (Addendum)
Patient ID: Kathryn Knapp, female   DOB: [birth year], 74 y.o.   MRN: [id number] Continues to report discomfort in her right below-knee amputation. Diffuse edema. Her dressing was removed. Suture line looks quite good with no breakdown. No evidence of healing difficulty from her BKA. Will see him again Monday.

## 2014-12-30 NOTE — Progress Notes (Signed)
  Date: 12/30/2014  Patient name: Kathryn Knapp  Medical record number: [id number]  Date of birth: 07-May-1941   This patient has been seen and the plan of care was discussed with the house staff. Please see their note for complete details. I concur with their findings with the following additions/corrections: [name] is in a lot of pain after the dressing change. The pain med does take the edge off but has not yet kicked in. The pain is quite troubling to her - pain in stump, phantom pain, and ABD pain. Dr Bridgett Larsson had written for daily dressing changes - if planned, would pre-medicate with opioids.  Bartholomew Crews, MD 12/30/2014, 12:18 PM

## 2014-12-30 NOTE — Progress Notes (Signed)
Subjective: Continues to report phantom limb pain, has been realitively well controlled with pain medications, removal of bandage this morning by vascular caused increased pain, nurse has placed ACE bandage back. Objective: Vital signs in last 24 hours: Filed Vitals:   12/29/14 1212 12/29/14 1721 12/29/14 2005 12/30/14 0411  BP: 123/63 121/65 137/54 128/58  Pulse: 85 80 90 89  Temp: 97.8 F (36.6 C) 98 F (36.7 C) 97.5 F (36.4 C) 98 F (36.7 C)  TempSrc: Axillary Oral Oral Oral  Resp: 24 20 19 18   Height:      Weight:   194 lb 3.6 oz (88.1 kg)   SpO2: 100% 100% 95% 95%   Weight change:   Intake/Output Summary (Last 24 hours) at 12/30/14 0944 Last data filed at 12/30/14 0454  Gross per 24 hour  Intake   1157 ml  Output   1130 ml  Net     27 ml   General: resting in bed HEENT: no scleral icterus, IJ in place right neck Cardiac: RRR, no rubs, murmurs or gallops Pulm: clear to auscultation bilaterally Abd: soft, nontender, nondistended, BS present, surgical dressings c/d/i, jp drain removed, ostomy bag in place with stool Ext: warm and well perfused, peripherial edema of arms and legs, right BKA with ACE bandage Neuro: alert and oriented X3 Lab Results: Basic Metabolic Panel:  Recent Labs Lab 12/24/14 0315  12/25/14 0458 12/26/14 0230  12/29/14 0450 12/30/14 0614  NA 140  --  139  --   < > 136 136  K 2.8*  < > 2.9*  --   < > 3.5 3.6  CL 102  --  103  --   < > 102 103  CO2 29  --  28  --   < > 26 26  GLUCOSE 114*  --  138*  --   < > 100* 104*  BUN 42*  --  32*  --   < > 9 11  CREATININE 2.09*  --  1.67*  --   < > 1.06* 1.08*  CALCIUM 7.1*  --  7.0*  --   < > 7.4* 7.4*  MG 1.6*  < > 2.0 1.9  --   --   --   PHOS 4.0  --  3.1  --   --   --   --   < > = values in this interval not displayed. Liver Function Tests:  Recent Labs Lab 12/24/14 0315 12/25/14 0458  AST 69* 49*  ALT 45 34  ALKPHOS 89 86  BILITOT 0.6 0.4  PROT 4.2* 4.1*  ALBUMIN 1.1* 1.1*   No  results for input(s): LIPASE, AMYLASE in the last 168 hours. No results for input(s): AMMONIA in the last 168 hours. CBC:  Recent Labs Lab 12/24/14 0315 12/25/14 0458  12/29/14 0450 12/30/14 0614  WBC 16.7* 20.4*  < > 20.3* 20.5*  NEUTROABS 14.4* 17.8*  --   --   --   HGB 8.8* 8.1*  < > 9.8* 9.4*  HCT 26.6* 25.4*  < > 30.9* 30.3*  MCV 78.5 79.9  < > 82.8 83.7  PLT 227 297  < > 411* 406*  < > = values in this interval not displayed. Cardiac Enzymes: No results for input(s): CKTOTAL, CKMB, CKMBINDEX, TROPONINI in the last 168 hours. BNP: No results for input(s): PROBNP in the last 168 hours. D-Dimer: No results for input(s): DDIMER in the last 168 hours. CBG:  Recent Labs Lab 12/29/14 1211 12/29/14  1642 12/29/14 2003 12/29/14 2352 12/30/14 0410 12/30/14 0741  GLUCAP 177* 148* 118* 141* 108* 92   Hemoglobin A1C:  Recent Labs Lab 12/24/14 0315  HGBA1C 6.3*   Fasting Lipid Panel:  Recent Labs Lab 12/24/14 0315  CHOL 109  HDL 14*  LDLCALC 64  TRIG 156*  CHOLHDL 7.8   Thyroid Function Tests: No results for input(s): TSH, T4TOTAL, FREET4, T3FREE, THYROIDAB in the last 168 hours. Coagulation:  Recent Labs Lab 12/28/14 0612  LABPROT 15.4*  INR 1.21   Anemia Panel: No results for input(s): VITAMINB12, FOLATE, FERRITIN, TIBC, IRON, RETICCTPCT in the last 168 hours. Urine Drug Screen: Drugs of Abuse  No results found for: LABOPIA, COCAINSCRNUR, LABBENZ, AMPHETMU, THCU, LABBARB  Alcohol Level: No results for input(s): ETH in the last 168 hours. Urinalysis: No results for input(s): COLORURINE, LABSPEC, PHURINE, GLUCOSEU, HGBUR, BILIRUBINUR, KETONESUR, PROTEINUR, UROBILINOGEN, NITRITE, LEUKOCYTESUR in the last 168 hours.  Invalid input(s): APPERANCEUR   Micro Results: No results found for this or any previous visit (from the past 240 hour(s)). Studies/Results: No results found. Medications: I have reviewed the patient's current medications. Scheduled  Meds: . amLODipine  5 mg Oral Daily  . antiseptic oral rinse  7 mL Mouth Rinse q12n4p  . chlorhexidine  15 mL Mouth Rinse BID  . docusate sodium  100 mg Oral Daily  . enoxaparin (LOVENOX) injection  90 mg Subcutaneous Q12H  . feeding supplement (ENSURE ENLIVE)  237 mL Oral BID BM  . FLUoxetine  20 mg Oral Daily  . insulin aspart  0-9 Units Subcutaneous 6 times per day  . pantoprazole  40 mg Oral Daily   Continuous Infusions: . sodium chloride 20 mL/hr (12/29/14 1511)  . dextrose 5 % and 0.45 % NaCl with KCl 20 mEq/L Stopped (12/29/14 1510)  . lactated ringers 50 mL/hr at 12/28/14 1101   PRN Meds:.acetaminophen **OR** acetaminophen, albuterol, alum & mag hydroxide-simeth, fentaNYL (SUBLIMAZE) injection, guaiFENesin-dextromethorphan, hydrALAZINE, labetalol, magnesium sulfate 1 - 4 g bolus IVPB, metoprolol, ondansetron, oxyCODONE-acetaminophen, phenol, potassium chloride Assessment/Plan: Principal Problem:   Septic shock Active Problems:   Bowel obstruction   Colonic mass   S/P partial colectomy   Severe sepsis with acute organ dysfunction   Noncompliance with medication regimen   Acute respiratory failure with hypoxia   Thrombus   Acute renal failure syndrome   Peritonitis   Colon cancer metastasized to multiple sites   Acute blood loss anemia   Melena   Diabetes type 2, controlled   Hypokalemia   Hypomagnesemia   Depression   Abdominal pain   Pain in joint, lower leg   Palliative care encounter  [phone] (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 -Outpatient oncology follow-up. -Carb modified diet. -D/C IVF -Continue Percocet 5-325, 1-2 tabs every 4 hours as needed. -Continue fentanyl 12.5-25 g IV every 2 hours needed. -Continue work with PT,OT -CIR consulted, potentially may have bed for Monday -- Work  to remove IJ and foley today.   #Status post right BKA for ischemic right foot secondary to thrombus from atrial fibrillation She has some burning in her right leg concerning for phantom limb pain. Hopefully, this will improve with physical therapy and rehabilitation. -Indefinite anticoagulation with Lovenox given malignancy  -D/C  telemetry. -Pain management as above.  #Septic shock secondary to GI perforation No signs of infection currently. White blood cell count trending down. -Continue  to monitor for fever. -Trend white blood cell count post right BKA. 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  #Acute blood loss anemia Hemoglobin stable the morning. -Continue to monitor hemoglobin.  #Hypertension- improved -amlodipine 5 mg daily. -Continue hydralazine 10 mg when necessary for SBP greater than 160.  #Acute renal failure Creatinine stable. -Continue IV fluids as above.  #Type 2 diabetes- well controlled Blood sugar control remains good, receiving minimal insulin per sliding scale -Continue CBGs and sliding scale insulin sensitive ACHS.  #Indigestion -Maalox every 2 hours when necessary.  #Depression -Continue Prozac 20 mg daily.   DVT PPX - heparin  CODE STATUS - DNR  CONSULTS PLACED - PCCM, Oncology, General Surgery, Vascular Surgery, GI, palliative care.  DISPO - Disposition is deferred at this time, awaiting improvement of ischemic foot.   Anticipated discharge in approximately 2 day(s).   The patient does not have a current PCP (No PCP Per Patient) and does need an [facility] hospital follow-up appointment after discharge.   Is the [facility] hospital follow-up appointment a one-time only appointment? yes.  Does the patient have transportation limitations that hinder transportation to clinic appointments? yes  .Services Needed at time of discharge: Y = Yes, Blank  = No PT:   OT:   RN:   Equipment:   Other:     LOS: 14 days   Lucious Groves, DO 12/30/2014, 9:44 AM

## 2014-12-31 LAB — GLUCOSE, CAPILLARY
GLUCOSE-CAPILLARY: 119 mg/dL — AB (ref 65–99)
Glucose-Capillary: 101 mg/dL — ABNORMAL HIGH (ref 65–99)
Glucose-Capillary: 104 mg/dL — ABNORMAL HIGH (ref 65–99)
Glucose-Capillary: 158 mg/dL — ABNORMAL HIGH (ref 65–99)
Glucose-Capillary: 89 mg/dL (ref 65–99)
Glucose-Capillary: 94 mg/dL (ref 65–99)

## 2014-12-31 LAB — CBC
HEMATOCRIT: 28.7 % — AB (ref 36.0–46.0)
Hemoglobin: 9 g/dL — ABNORMAL LOW (ref 12.0–15.0)
MCH: 26.5 pg (ref 26.0–34.0)
MCHC: 31.4 g/dL (ref 30.0–36.0)
MCV: 84.4 fL (ref 78.0–100.0)
Platelets: 406 10*3/uL — ABNORMAL HIGH (ref 150–400)
RBC: 3.4 MIL/uL — ABNORMAL LOW (ref 3.87–5.11)
RDW: 18.2 % — ABNORMAL HIGH (ref 11.5–15.5)
WBC: 15.5 10*3/uL — AB (ref 4.0–10.5)

## 2014-12-31 MED ORDER — ENOXAPARIN SODIUM 150 MG/ML ~~LOC~~ SOLN
130.0000 mg | SUBCUTANEOUS | Status: DC
  Administered 2014-12-31 – 2015-01-01 (×2): 130 mg via SUBCUTANEOUS
  Filled 2014-12-31 (×2): qty 1

## 2014-12-31 MED ORDER — GABAPENTIN 100 MG PO CAPS
100.0000 mg | ORAL_CAPSULE | Freq: Three times a day (TID) | ORAL | Status: DC
  Administered 2014-12-31 – 2015-01-01 (×4): 100 mg via ORAL
  Filled 2014-12-31 (×6): qty 1

## 2014-12-31 NOTE — Progress Notes (Signed)
Subjective:    The patient reports continued phantom limb pain and burning in her surgical stump. She reports some improvement after massaging the leg. She reports eating well with no abdominal pain, shortness of breath, nausea, or vomiting. She reports standing up with physical therapy yesterday, and she is continuing to see some improvement in her strength.  Interval Events: -Afebrile, vital signs stable.    Objective:    Vital Signs:   Temp:  [97.9 F (36.6 C)-98.6 F (37 C)] 98.6 F (37 C) (05/15 0413) Pulse Rate:  [83-88] 87 (05/15 0413) Resp:  [16-20] 16 (05/15 0413) BP: (128-153)/(52-60) 153/58 mmHg (05/15 0413) SpO2:  [96 %-100 %] 99 % (05/15 0413) Last BM Date: 12/29/14  24-hour weight change: Weight change:   Intake/Output:   Intake/Output Summary (Last 24 hours) at 12/31/14 0801 Last data filed at 12/31/14 0501  Gross per 24 hour  Intake    720 ml  Output   1500 ml  Net   -780 ml      Physical Exam: General: Well-developed, well-nourished, in no acute distress; alert, appropriate and cooperative throughout examination.  Lungs:  Normal respiratory effort. Clear to auscultation BL without crackles or wheezes.  Heart: RRR. S1 and S2 normal without gallop, murmur, or rubs.  Abdomen:  Surgical incisions dressed, clean dry, and intact.  Colostomy bag in RLQ with brown stool.  No tenderness to palpation.  Extremities: Status post R BKA with wound dressing in place, nontender. Trace edema in arms and legs, right arm >left arm, improved.    Labs:  Basic Metabolic Panel:  Recent Labs Lab 12/24/14 1718  12/25/14 0458 12/26/14 0230 12/26/14 0820 12/27/14 0701 12/28/14 5537 12/29/14 0450 12/30/14 0614  NA  --   < > 139  --  140 139 139 136 136  K 3.2*  --  2.9*  --  3.5 3.5 3.3* 3.5 3.6  CL  --   < > 103  --  106 106 106 102 103  CO2  --   < > 28  --  28 27 27 26 26   GLUCOSE  --   < > 138*  --  128* 118* 114* 100* 104*  BUN  --   < > 32*  --  21* 18 14 9  11   CREATININE  --   < > 1.67*  --  1.28* 1.18* 1.06* 1.06* 1.08*  CALCIUM  --   < > 7.0*  --  7.1* 7.2* 7.2* 7.4* 7.4*  MG 2.2  --  2.0 1.9  --   --   --   --   --   PHOS  --   --  3.1  --   --   --   --   --   --   < > = values in this interval not displayed.  Liver Function Tests:  Recent Labs Lab 12/25/14 0458  AST 49*  ALT 34  ALKPHOS 86  BILITOT 0.4  PROT 4.1*  ALBUMIN 1.1*   CBC:  Recent Labs Lab 12/25/14 0458  12/28/14 0612 12/28/14 1730 12/29/14 0450 12/30/14 0614 12/31/14 0626  WBC 20.4*  < > 21.2* 22.5* 20.3* 20.5* 15.5*  NEUTROABS 17.8*  --   --   --   --   --   --   HGB 8.1*  < > 9.5* 9.6* 9.8* 9.4* 9.0*  HCT 25.4*  < > 29.7* 30.5* 30.9* 30.3* 28.7*  MCV 79.9  < > 81.8 82.0 82.8  83.7 84.4  PLT 297  < > 391 406* 411* 406* 406*  < > = values in this interval not displayed. CBG:  Recent Labs Lab 12/30/14 1145 12/30/14 1631 12/30/14 2004 12/31/14 12/31/14 0411  GLUCAP 108* 140* 107* 101* 47    Microbiology: Results for orders placed or performed during the hospital encounter of 12/16/14  Surgical pcr screen     Status: None   Collection Time: 12/17/14  6:20 AM  Result Value Ref Range Status   MRSA, PCR NEGATIVE NEGATIVE Final   Staphylococcus aureus NEGATIVE NEGATIVE Final    Comment:        The Xpert SA Assay (FDA approved for NASAL specimens in patients over 103 years of age), is one component of a comprehensive surveillance program.  Test performance has been validated by [facility] for patients greater than or equal to 17 year old. It is not intended to diagnose infection nor to guide or monitor treatment.   Surgical pcr screen     Status: None   Collection Time: 12/17/14 11:45 AM  Result Value Ref Range Status   MRSA, PCR NEGATIVE NEGATIVE Final   Staphylococcus aureus NEGATIVE NEGATIVE Final    Comment:        The Xpert SA Assay (FDA approved for NASAL specimens in patients over 47 years of age), is one component of a  comprehensive surveillance program.  Test performance has been validated by [facility] for patients greater than or equal to 33 year old. It is not intended to diagnose infection nor to guide or monitor treatment.   Culture, blood (routine x 2)     Status: None   Collection Time: 12/17/14  4:25 PM  Result Value Ref Range Status   Specimen Description BLOOD LEFT WRIST  Final   Special Requests BOTTLES DRAWN AEROBIC ONLY 1CC  Final   Culture   Final    NO GROWTH 5 DAYS Note: Culture results may be compromised due to an inadequate volume of blood received in culture bottles. Performed at [facility]    Report Status 12/23/2014 FINAL  Final  Culture, blood (routine x 2)     Status: None   Collection Time: 12/17/14  4:28 PM  Result Value Ref Range Status   Specimen Description BLOOD CENTRAL LINE  Final   Special Requests BOTTLES DRAWN AEROBIC ONLY 10CC  Final   Culture   Final    NO GROWTH 5 DAYS Performed at [facility]    Report Status 12/23/2014 FINAL  Final   Imaging: No results found.     Medications:    Infusions: . sodium chloride 20 mL/hr (12/29/14 1511)  . lactated ringers 50 mL/hr at 12/28/14 1101    Scheduled Medications: . amLODipine  5 mg Oral Daily  . antiseptic oral rinse  7 mL Mouth Rinse q12n4p  . chlorhexidine  15 mL Mouth Rinse BID  . docusate sodium  100 mg Oral Daily  . enoxaparin (LOVENOX) injection  90 mg Subcutaneous Q12H  . feeding supplement (ENSURE ENLIVE)  237 mL Oral BID BM  . FLUoxetine  20 mg Oral Daily  . insulin aspart  0-9 Units Subcutaneous 6 times per day  . pantoprazole  40 mg Oral Daily    PRN Medications: acetaminophen **OR** acetaminophen, albuterol, alum & mag hydroxide-simeth, fentaNYL (SUBLIMAZE) injection, guaiFENesin-dextromethorphan, hydrALAZINE, labetalol, magnesium sulfate 1 - 4 g bolus IVPB, metoprolol, ondansetron, oxyCODONE-acetaminophen, phenol, potassium chloride, sodium chloride   Assessment/  Plan:  Principal Problem:   Septic shock Active Problems:   Bowel obstruction   Colonic mass   S/P partial colectomy   Severe sepsis with acute organ dysfunction   Noncompliance with medication regimen   Acute respiratory failure with hypoxia   Thrombus   Acute renal failure syndrome   Peritonitis   Colon cancer metastasized to multiple sites   Acute blood loss anemia   Melena   Diabetes type 2, controlled   Hypokalemia   Hypomagnesemia   Depression   Abdominal pain   Pain in joint, lower leg   Palliative care encounter  [phone] (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 -Outpatient oncology follow-up. -Carb modified diet. -Continue Percocet 5-325, 1-2 tabs every 4 hours as needed. -Continue fentanyl 12.5-25 g IV every 2 hours needed. -Continue Colace 100 mg daily. -Continue work with PT and OT. -Follow-up CIR recommendations. -Insert peripheral IV then discontinue central line.  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 Cochrane review on pharmacologic interventions for phantom limb pain found a trend towards positive benefit of gabapentin, so we'll try that today given her ongoing pain. -Continue Lovenox 90 mg every 12 hours. -Pain medications as above. -Start gabapentin 100 mg 3 times a day. -Continue to work with PT. -Daily dressing changes per vascular surgery. -Premedication with opioids prior to dressing changes.  #Septic shock secondary to GI perforation White blood cell count continues to trend down. -Continue to trend the WBC count.  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  #Acute blood loss anemia Hemoglobin stable. -Continue to monitor hemoglobin.  #Hypertension Generally at  goal. -Continue amlodipine 5 mg daily. -Continue hydralazine 10 mg when necessary for SBP greater than 160.  #Acute renal failure Creatinine stable yesterday. -No further workup.  #Type 2 diabetes Blood sugar control stable. Probably will not need diabetes medications on discharge. -Continue CBGs and sliding scale insulin sensitive ACHS.  #Indigestion -Continue Protonix 40 mg daily. -Maalox every 2 hours when necessary.  #Depression -Continue Prozac 20 mg daily.   DVT PPX - heparin  CODE STATUS - DNR  CONSULTS PLACED - PCCM, Oncology, General Surgery, Vascular Surgery, GI, palliative care.  DISPO - Disposition is deferred at this time, awaiting improvement of ischemic foot.   Anticipated discharge in approximately 1-3 day(s).   The patient does not have a current PCP (No PCP Per Patient) and does need an [facility] hospital follow-up appointment after discharge.    Is the [facility] hospital follow-up appointment a one-time only appointment? yes.  Does the patient have transportation limitations that hinder transportation to clinic appointments? yes   SERVICE NEEDED AT [place]         Y = Yes, Blank = No PT: CIR  OT: CIR  RN:   Equipment:   Other:      Length of Stay: 15 day(s)   Signed: Charlesetta Shanks, MD  PGY-1, Internal Medicine Resident Pager: [phone] (7AM-5PM) 12/31/2014, 8:01 AM

## 2014-12-31 NOTE — Discharge Summary (Signed)
Name: Kathryn Knapp MRN: [id number] DOB: [birth year] 74 y.o. PCP: No Pcp Per Patient  Date of Admission: 12/16/2014  9:46 AM Date of Discharge: 01/01/2015 Attending Physician: Levert Feinstein, MD  Discharge Diagnosis: Principal Problem:   Colon cancer metastasized to multiple sites Active Problems:   Bowel obstruction   Colonic mass   S/P partial colectomy   Septic shock   Severe sepsis with acute organ dysfunction   Noncompliance with medication regimen   Acute respiratory failure with hypoxia   Thrombus   Acute renal failure syndrome   Peritonitis   Acute blood loss anemia   Melena   Diabetes type 2, controlled   Hypokalemia   Hypomagnesemia   Depression   Abdominal pain   Pain in joint, lower leg   Palliative care encounter  Discharge Medications:   Medication List    STOP taking these medications        HYDROcodone-acetaminophen 5-325 MG per tablet  Commonly known as:  NORCO/VICODIN     ibuprofen 200 MG tablet  Commonly known as:  ADVIL,MOTRIN     ibuprofen 800 MG tablet  Commonly known as:  ADVIL,MOTRIN      TAKE these medications        acetaminophen 325 MG tablet  Commonly known as:  TYLENOL  Take 1-2 tablets (325-650 mg total) by mouth every 4 (four) hours as needed for mild pain (or temp >/= 101 F).     alum & mag hydroxide-simeth 200-200-20 MG/5ML suspension  Commonly known as:  MAALOX/MYLANTA  Take 15-30 mLs by mouth every 2 (two) hours as needed for indigestion.     amLODipine 5 MG tablet  Commonly known as:  NORVASC  Take 1 tablet (5 mg total) by mouth daily.     docusate sodium 100 MG capsule  Commonly known as:  COLACE  Take 1 capsule (100 mg total) by mouth daily.     enoxaparin 100 MG/ML injection  Commonly known as:  LOVENOX  Inject 1.3 mLs (130 mg total) into the skin daily.     feeding supplement (ENSURE ENLIVE) Liqd  Take 237 mLs by mouth 2 (two) times daily between meals.     fentaNYL 100 MCG/2ML injection  Commonly  known as:  SUBLIMAZE  Inject 0.25-0.5 mLs (12.5-25 mcg total) into the vein every 2 (two) hours as needed for severe pain.     FLUoxetine 20 MG/5ML solution  Commonly known as:  PROZAC  Take 5 mLs (20 mg total) by mouth daily.     gabapentin 100 MG capsule  Commonly known as:  NEURONTIN  Take 1 capsule (100 mg total) by mouth 3 (three) times daily.     oxyCODONE-acetaminophen 5-325 MG per tablet  Commonly known as:  PERCOCET/ROXICET  Take 1-2 tablets by mouth every 4 (four) hours as needed for moderate pain or severe pain.     pantoprazole 40 MG tablet  Commonly known as:  PROTONIX  Take 1 tablet (40 mg total) by mouth daily.        Disposition and follow-up:   Ms.Kathryn Knapp was discharged from [facility] in Fair condition.  At the hospital follow up visit please address:  1.  Wean pain medications as tolerated, monitor CBC, follow up with oncology, general surgery, and vascular surgery, establish with PCP, repeat echocardiogram in 4-6 months, daily abdominal and R BKA stump dressing changes.  2.  Labs / imaging needed at time of follow-up: CBC.  3.  Pending  labs/ test needing follow-up: None.  Follow-up Appointments: Follow-up Information    Follow up with Kathryn Knapp [place], MD. Schedule an appointment as soon as possible for a visit in 2 weeks.   Specialty:  General Surgery   Why:  For post-operation check 2 weeks after discharge from inpatient rehab   Contact information:   [address] [place] [address] [phone]       Follow up with Kathryn Barthel, MD In 4 weeks.   Specialties:  Vascular Surgery, Cardiology   Why:  Office will call you to arrange your appt (sent)   Contact information:   [place] [address] [phone]       Follow up with Kathryn Jarred, MD In 3 weeks.   Specialty:  General Surgery   Contact information:   [place] [address] [phone]       Call Kathryn Merle, MD.   Specialties:  Hematology, Oncology   Why:  After discharge from inpatient rehab to schedule follow up with oncology.   Contact information:   [place] [address] [phone]       Discharge Instructions: Discharge Instructions    Diet - low sodium heart healthy    Complete by:  As directed      Increase activity slowly    Complete by:  As directed            Thank you for allowing [place] to be involved in your healthcare while you were hospitalized at [facility].   Please note that there have been changes to your home medications.  --> PLEASE LOOK AT YOUR DISCHARGE MEDICATION LIST FOR DETAILS.  Please arrange follow up with oncology after discharge from rehabilitation.  You should also establish with a PCP.  We would be happy to see you in our [facility].  You can schedule an appointment at [phone].  I have included other PCP options below if you are interested.  Consultations: Gen. surgery, GI, PCCM, oncology, vascular surgery, palliative care.  Procedures Performed:  Ct Abdomen Pelvis Wo Contrast  12/25/2014   CLINICAL DATA:  Abdominal pain.  Status post exploratory laparotomy.  EXAM: CT ABDOMEN AND PELVIS WITHOUT CONTRAST  TECHNIQUE: Multidetector CT imaging of the abdomen and pelvis was performed following the standard protocol without IV contrast.  COMPARISON:  12/16/2014  FINDINGS: Lower chest: Bilateral pleural effusions are identified, left greater than right. There is compressive type consolidation involving both lower lobes.  Hepatobiliary: There is no suspicious liver abnormality. Status post cholecystectomy. The common bile duct measures 8 mm.  Pancreas: Normal appearance of the pancreas.  Spleen: Splenic granulomas are again noted.  Adrenals/Urinary Tract: The right adrenal gland appears normal. Nodule in the left adrenal gland is unchanged measuring 1.9 by 1.6 cm, image 28/series 2. The right kidney  appears normal. The left kidney is also normal. Urinary bladder contains a small amount of gas which may be related to recent instrumentation  Stomach/Bowel: Unremarkable appearance of the stomach. The small bowel loops are mildly increased in caliber. The patient has a right lower quadrant ileostomy status post right hemicolectomy. The left colon, sigmoid colon and rectum are unremarkable.  Vascular/Lymphatic: Calcified atherosclerotic disease involves the abdominal aorta. No aneurysm. Multiple prominent retroperitoneal lymph nodes are identified, similar to the previous exam. Index left-sided retroperitoneal node measures 1.2 cm, image 39/ series 2. Similar to previous exam. 9 mm periaortic lymph  node is also unchanged from previous study. No pelvic or inguinal adenopathy.  Reproductive: The uterus appears within normal limits. Under the adnexal structures are unremarkable.  Other: There is diffuse body wall edema. Multiple areas of mildly complex, intermediate attenuation fluid are identified within the abdomen and pelvis. These are difficult to characterize without IV contrast material. Focal fluid collection within the cul-de-sac may represent loculated ascites. This measures 5.6 x 3.9 cm. Within the right lower quadrant of the abdomen there is a fluid collection which measures 6.7 x3.8 cm, image 25/series 5. Within the left upper quadrant of the abdomen fluid collection measures 3.6 x 3.8 cm. Area of possible loculated fluid in the left upper quadrant of the abdomen measures 5 cm, image 23/series 2. Peritoneal nodularity is again identified compatible with peritoneal metastasis. Open ventral abdominal wall wound is again noted.  Musculoskeletal: Spondylosis noted within the lower thoracic and lower lumbar spine. No aggressive lytic or sclerotic bone lesions identified.  IMPRESSION: 1. Postoperative changes compatible with right hemicolectomy and right lower quadrant ileostomy. 2. Diffuse body wall edema,  bilateral pleural effusions are identified consistent with fluid overload state. 3. Multiple areas of loculated fluid are identified within the abdomen or pelvis. Evaluation is limited due to lack of IV contrast material. Findings may represent loculated ascites secondary to peritoneal metastasis. Multiple abdominal and pelvic abscess ease cannot be excluded. Finally in this patient who is on heparin intra-abdominal hemorrhage/hematomas cannot be excluded. 4. Diffuse peritoneal nodularity compatible with peritoneal metastasis.   Electronically Signed   By: Kerby Moors M.D.   On: 12/25/2014 21:36   Dg Chest 1 View  12/17/2014   CLINICAL DATA:  Intubated  EXAM: CHEST  1 VIEW  COMPARISON:  CT chest dated 12/17/2014  FINDINGS: Increased interstitial markings. Mild left basilar atelectasis. Trace left pleural effusion is not evident radiographically.  Endotracheal tube terminates 4.5 cm above the carina. Enteric tube courses into the stomach.  The heart is top-normal in size for  IMPRESSION: Endotracheal tube terminates 4.5 cm above the carina.   Electronically Signed   By: Julian Hy M.D.   On: 12/17/2014 10:57   Ct Chest W Contrast  12/17/2014   CLINICAL DATA:  Assess for metastases within the chest. Recently diagnosed colonic mass. Initial encounter.  EXAM: CT CHEST WITH CONTRAST  TECHNIQUE: Multidetector CT imaging of the chest was performed during intravenous contrast administration.  CONTRAST:  47mL OMNIPAQUE IOHEXOL 300 MG/ML  SOLN  COMPARISON:  CT of the abdomen and pelvis from 12/16/2014  FINDINGS: A small left pleural effusion is noted, new from the prior study, likely reactive secondary to the bowel perforation described below. Bibasilar atelectasis is noted. The lungs are otherwise clear. There is no evidence of pulmonary metastases. No pneumothorax is identified.  Scattered coronary artery calcifications are seen. A small pericardial effusion is identified, similar in appearance to the prior  study. No mediastinal lymphadenopathy is seen. Scattered calcification is noted along the aortic arch and proximal great vessels. The visualized portions of thyroid gland are unremarkable. No axillary lymphadenopathy is seen.  Note is made of a large amount of free air and free fluid within the upper abdomen, primarily about the liver, compatible with acute perforation. This likely reflects perforation at the level of the patient's large colonic malignancy, though the site of perforation is not definitely characterized on this study.  Fluid tracks about the spleen, and air tracks about the hepatic hilum. The visualized portions of the liver and spleen are  otherwise grossly unremarkable. The patient is status post cholecystectomy, with clips noted along the gallbladder fossa. A 2.5 cm left adrenal lesion again raises concern for metastasis.  The patient's large left splenic flexure colonic mass is again noted, with diffuse surrounding nodularity, reflecting local spread of disease. Extensive surrounding soft tissue inflammation is mildly more prominent than on the prior study.  Nonspecific perinephric stranding is noted bilaterally.  No acute osseous abnormalities are seen.  IMPRESSION: 1. Large amount of free air and free fluid within the upper abdomen follow-up rarely about the liver, compatible with acute perforation. This likely reflects perforation at the level of the large colonic malignancy, though the site of perforation is not definitely characterized on this study. 2. Large left splenic flexure colonic mass again noted, with diffuse surrounding nodularity, reflecting local spread of disease. Extensive surrounding soft tissue inflammation is mildly more prominent than on the prior study. 3. Small left pleural effusion, new from the recent prior study, likely reactive secondary to bowel perforation. Bibasilar atelectasis noted. Lungs otherwise clear. 4. Scattered coronary artery calcifications seen. 5. Small  pericardial effusion again noted. 6. 2.5 cm left adrenal lesion again raises concern for metastasis.  Critical Value/emergent results were called by telephone at the time of interpretation on 12/17/2014 at 5:07 am to Dr. Ralene Ok, who verbally acknowledged these results.   Electronically Signed   By: Garald Balding M.D.   On: 12/17/2014 05:13   Ct Abdomen Pelvis W Contrast  12/16/2014   CLINICAL DATA:  Mid/lower abdominal pain x2 weeks, nausea/vomiting, diarrhea  EXAM: CT ABDOMEN AND PELVIS WITH CONTRAST  TECHNIQUE: Multidetector CT imaging of the abdomen and pelvis was performed using the standard protocol following bolus administration of intravenous contrast.  CONTRAST:  112mL OMNIPAQUE IOHEXOL 300 MG/ML  SOLN  COMPARISON:  100 mL Omnipaque 300 IV  FINDINGS: Lower chest: Mild dependent atelectasis at the lung bases. Trace left pleural effusion.  Small pericardial effusion. Coronary atherosclerosis. Atherosclerotic calcifications of the aortic arch/bruit.  Coronary atherosclerosis in the distal esophagus, suggesting gastroesophageal reflux or esophageal dysmotility.  Hepatobiliary: Liver is within normal limits. No suspicious/ enhancing hepatic lesions.  Status post cholecystectomy. No intrahepatic ductal dilatation. Mild prominence of the common duct, likely postsurgical.  Pancreas: Within normal limits.  Spleen: Within normal limits.  Adrenals/Urinary Tract: 2.3 x 1.7 cm left adrenal nodule (series 21/ image 26), indeterminate, suspicious for metastasis given the associated findings.  Kidneys are within normal limits.  No hydronephrosis.  Bladder is underdistended.  Stomach/Bowel: Stomach is notable for a small hiatal hernia.  No evidence of small bowel obstruction.  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  Associated gross pericolonic extension with associated pericolonic  soft tissue implants/nodes, for example measuring 1.7 x 2.2 cm medially (series 21/image 28) and 2.1 x 2.6 cm laterally (series 21/ image 25).  Dilated mobile cecum, which is present in the left lower abdomen, measuring up to 11.1 cm (series 21/image 64).  Vascular/Lymphatic: Atherosclerotic calcifications of the abdominal aorta and branch vessels.  Associated mesenteric/ retroperitoneal nodes, including a 10 mm short axis node beneath the left renal vein (series 21/image 42) and a 12 mm short axis node anterior to the pancreatic tail (series 201/ image 29).  Reproductive: Uterus is unremarkable.  Bilateral ovaries are within normal limits.  Other: Associated small volume pelvic ascites.  No free air.  Musculoskeletal: Degenerative  changes of the visualized thoracolumbar spine.  Grade 1 anterolisthesis of L4 on L5.  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  Associated gross pericolonic extension with pericolonic soft tissue implants/nodes, as described above.  Associated mesenteric/retroperitoneal nodal metastases. 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  Associated small volume pelvic ascites.  No free air.  Dilated mobile cecum in the left lower abdomen, measuring up to 11.1 cm.  Trace left pleural effusion.  Small pericardial effusion.   Electronically Signed   By: Julian Hy M.D.   On: 12/16/2014 13:46   Dg Chest Port 1 View  12/21/2014   CLINICAL DATA:  Respiratory failure  EXAM: PORTABLE CHEST - 1 VIEW  COMPARISON:  12/19/2014  FINDINGS: Endotracheal tube is 4.4 cm above the carina. The nasogastric tube extends into the stomach. The right jugular central line extends to the cavoatrial junction. Consolidation and effusion persist in the left base without significant interval change. The right lung remains clear except for minimal linear scarring or atelectasis.  IMPRESSION: Support equipment  appears satisfactorily positioned.  Unchanged left base consolidation and effusion.   Electronically Signed   By: Andreas Newport M.D.   On: 12/21/2014 06:33   Dg Chest Port 1 View  12/19/2014   CLINICAL DATA:  Acute respiratory failure with hypoxia.  EXAM: PORTABLE CHEST - 1 VIEW  COMPARISON:  12/18/2014.  FINDINGS: Unchanged support tubes and lines. LEFT lower lobe atelectasis with pleural effusion, possible superimposed infiltrate, stable decreased lung volumes.  IMPRESSION: Stable chest.   Electronically Signed   By: Rolla Flatten M.D.   On: 12/19/2014 07:10   Dg Chest Portable 1 View  12/18/2014   CLINICAL DATA:  Respiratory failure  EXAM: PORTABLE CHEST - 1 VIEW  COMPARISON:  12/17/2014  FINDINGS: The endotracheal tube tip is 3.9 cm above the carina. The nasogastric tube extends into the stomach and off the inferior edge of the image. The right jugular central line extends to the cavoatrial junction. There is basilar consolidation on the left with obscured diaphragmatic contour. There is a small left pleural effusion. The right lung is clear.  IMPRESSION: Support equipment appears satisfactorily positioned.  There is dense left base consolidation and small left pleural effusion.   Electronically Signed   By: Andreas Newport M.D.   On: 12/18/2014 06:06   Dg Chest Port 1 View  12/17/2014   CLINICAL DATA:  Central line placement.  EXAM: PORTABLE CHEST - 1 VIEW  COMPARISON:  12/17/2014  FINDINGS: Endotracheal tube is in place with tip 5.4 cm above carina. Right IJ central line tip overlies the level of the lower superior vena cava. Heart is enlarged. Suspect left pleural effusion versus artifact related to positioning. No pneumothorax.  IMPRESSION: 1. Interval placement of right-sided IJ line, tip overlying the level of the lower superior vena cava. 2. No pneumothorax PE 3. Possible left pleural effusion.   Electronically Signed   By: Nolon Nations M.D.   On: 12/17/2014 14:56    2D Echo:  Study  Conclusions  - Left ventricle: The cavity size was normal. Wall thickness was increased in a pattern of mild LVH. Systolic function was normal. The estimated ejection fraction was in the range of 55% to 60%. Wall motion was normal; there were no regional wall motion abnormalities. Doppler parameters are consistent with abnormal left ventricular relaxation (grade 1 diastolic dysfunction). - Aortic valve: Valve area (VTI):  2.48 cm^2. Valve area (Vmax): 2.45 cm^2. Valve area (Vmean): 2.2 cm^2. - Pericardium, extracardiac: A small to moderate, loculated pericardial effusion was identified along the right ventricular free wall. The fluid had no internal echoes.There was no evidence of hemodynamic compromise.  Admission HPI:  Patient is a 74 year old with a remote history of hypertension and diabetes who resents with a two-week history of lower abdominal pain. Patient states that the pain is severe, sharp and knife like in quality, and nonradiating. She denies any associated symptoms over the last 2 weeks. She denies any associated nausea, vomiting, or diarrhea. She states that it is a constant pain that does not come and go. She has noted possibly weight loss over the last 3-4 weeks. She also notes shortness of breath with exertion over the last week preventing her from being able to walk to the bus stop with her grandchildren. She denies any associated chest pain. Today, she did note nausea and vomiting although this was after the administration of pain medications. She also noted some diarrhea today as well but denied having any blood in her stool today or before that. She denies feeling any new masses or enlarged lymph nodes throughout her body. She states that she does not take any medications at home. Remotely, she has taken lisinopril for blood pressure and a diabetes medication as well but she had discontinued this as she has not seen a primary care provider in a long time.  Otherwise, patient denies any fevers, night sweats, chills, dysuria, hematuria, or constipation.  In the emergency department, patient received 4 mg of morphine, 1 mg of Dilaudid, metoclopramide 10 mg, Zofran 4 mg, potassium chloride 40 mEq, 1 L of normal saline.  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 She had not seen a primary care doctor in several years. CT abdomen revealed a large bowel obstruction due to a colonic mass measuring up to 8.4 cm with pericolonic extension and associated lymph node metastases along with a suspected left adrenal metastasis. She was initially prepped for a colonoscopy for decompression prior to elective surgery. However, a large amount of free air was noted on chest CT for cancer staging. As result, she was taken urgently to the operating room for exploratory laparotomy. Copious liquid stool was present in the abdomen and evacuated. A large mass was noted to be adherent to the diaphragm and stomach. Partial colectomy and partial gastrectomy were performed along with end ileostomy. 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 Multiple satellite tumor nodules were noted within  the omentum. Oncology was consulted and felt that she would likely be a poor candidate for chemotherapy, but they offered to see her as an outpatient to discuss possible immunotherapy given the MMR deficient status of her colon cancer. Palliative care was consulted, and the patient agreed to be DO NOT RESUSCITATE but wishes to continue aggressive treatment. She recovered well post surgery and worked with physical therapy who recommended inpatient rehabilitation. CIR was consulted and agreed to admit the patient. General surgery recommends changing her abdominal dressings daily.  #Septic shock secondary to GI perforation The patient developed septic shock secondary to her GI perforation, and she was transferred to the ICU where she was placed on pressors and antibiotics. She slowly improved and was weaned off pressors, and she completed 7 days of Zosyn and 5 days of fluconazole. She was transferred out of the ICU, and she remained afebrile off of antibiotics, but her WBC count continued to rise likely due to her ischemic right foot. Repeat CT of her abdomen demonstrated loculated fluid representing ascites versus hemorrhage versus abscesses. The CT scan was reviewed by general surgery who felt the loculations were most likely peritoneal fluid. Her white blood cell count began to trend down after amputation of her right foot.  #Ischemic right foot secondary to thromboembolus from atrial fibrillation While in ICU, the patient developed atrial fibrillation with RVR along with cyanosis of her right foot. Her right lower extremity Doppler demonstrated occlusion of the proximal posterior tibial artery with an apparent embolus. She was started on heparin and aspirin. Vascular surgery was consulted and performed a right below-knee amputation. 24  hours post surgery, she was started on Lovenox for atrial fibrillation anticoagulation in the setting of malignancy, and this was continued at discharge.  The patient continued to have significant burning and pain following the operation, suggestive of phantom limb pain. She worked extensively with physical therapy, and her pain was controlled with Percocet, fentanyl, and gabapentin. Vascular surgery recommend daily dressing changes due to her edema from malnutrition.  #Malignant pericardial effusion Echocardiogram during the hospitalization demonstrated a mild to moderate loculated pericardial effusion without any evidence of hemodynamic compromise. This is discussed with cardiology who felt that the pericardial effusion was likely malignant, but they did not recommend further workup given the absence of hemodynamic compromise. They recommended a follow-up echocardiogram in 4-6 months to monitor for worsening of the pericardial effusion.  #Acute blood loss anemia The patient was noted to have some melena in her colostomy bag while in ICU, and her hemoglobin was noted to be 6.2. As result, she was transfused 2 units of packed red blood cells with improvement in her hemoglobin. 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  Please monitor her hemoglobin at inpatient rehab.  #Hypertension The patient had mild hypertension, and she was started on amlodipine 5 mg daily during the hospitalization.  #Acute renal failure The patient's creatinine was 1.0 on presentation, and she developed acute renal failure following her surgery and septic shock with elevation of her creatinine to 2.74. This improved back to her baseline with IV fluids.  #Type 2 diabetes She reported a remote history of type 2 diabetes, and she was previously on oral medications. However, she did stop taking these approximately a year ago because her blood sugars have been  running low. She was maintained on sliding scale insulin during the hospitalization, and she rarely required insulin therapy. Given her  recent weight loss, she likely no longer needs therapy for her diabetes. As result, no anti-hyperglycemic medications were prescribed at discharge.  #Indigestion The patient complained of some indigestion after her partial colectomy and gastrectomy. She was given Protonix 40 mg daily and Maalox when needed with improvement of her symptoms.  #Depression The patient was started on Prozac 20 mg daily during the hospitalization. Prior to discharge, she denied any symptoms of depression, but this was continued in hopes that it may improve her phantom limb pain.  Discharge Vitals:   BP 140/62 mmHg  Pulse 92  Temp(Src) 97.6 F (36.4 C) (Oral)  Resp 19  Ht $R'5\' 11"'Jc$  (1.803 m)  Wt 194 lb 3.6 oz (88.1 kg)  BMI 27.10 kg/m2  SpO2 100%  Discharge Labs:  Results for orders placed or performed during the hospital encounter of 12/16/14 (from the past 24 hour(s))  Glucose, capillary     Status: Abnormal   Collection Time: 12/31/14 11:53 AM  Result Value Ref Range   Glucose-Capillary 158 (H) 65 - 99 mg/dL  Glucose, capillary     Status: Abnormal   Collection Time: 12/31/14  4:39 PM  Result Value Ref Range   Glucose-Capillary 119 (H) 65 - 99 mg/dL  Glucose, capillary     Status: Abnormal   Collection Time: 12/31/14  9:17 PM  Result Value Ref Range   Glucose-Capillary 104 (H) 65 - 99 mg/dL  Glucose, capillary     Status: Abnormal   Collection Time: 01/01/15 12:18 AM  Result Value Ref Range   Glucose-Capillary 100 (H) 65 - 99 mg/dL  CBC     Status: Abnormal   Collection Time: 01/01/15  4:30 AM  Result Value Ref Range   WBC 13.2 (H) 4.0 - 10.5 K/uL   RBC 3.66 (L) 3.87 - 5.11 MIL/uL   Hemoglobin 9.9 (L) 12.0 - 15.0 g/dL   HCT 30.5 (L) 36.0 - 46.0 %   MCV 83.3 78.0 - 100.0 fL   MCH 27.0 26.0 - 34.0 pg   MCHC 32.5 30.0 - 36.0 g/dL   RDW 18.2 (H) 11.5 - 15.5 %    Platelets 309 150 - 400 K/uL  Glucose, capillary     Status: None   Collection Time: 01/01/15  4:31 AM  Result Value Ref Range   Glucose-Capillary 82 65 - 99 mg/dL  Glucose, capillary     Status: None   Collection Time: 01/01/15  7:43 AM  Result Value Ref Range   Glucose-Capillary 89 65 - 99 mg/dL    Signed: Charlesetta Shanks, MD 01/01/2015, 10:44 AM    Services Ordered on Discharge: CIR. Equipment Ordered on Discharge: None.

## 2014-12-31 NOTE — Progress Notes (Signed)
ANTICOAGULATION CONSULT NOTE - Follow Up Consult  Pharmacy Consult for Lovenox Indication: DVT/atrial fibrillation  No Known Allergies  Patient Measurements: Height: 5\' 11"  (180.3 cm) Weight: 194 lb 3.6 oz (88.1 kg) IBW/kg (Calculated) : 70.8  Vital Signs: Temp: 98.6 F (37 C) (05/15 0413) Temp Source: Oral (05/15 0413) BP: 153/58 mmHg (05/15 0413) Pulse Rate: 87 (05/15 0413)  Labs:  Recent Labs  12/29/14 0450 12/30/14 0614 12/31/14 0626  HGB 9.8* 9.4* 9.0*  HCT 30.9* 30.3* 28.7*  PLT 411* 406* 406*  HEPARINUNFRC <0.10*  --   --   CREATININE 1.06* 1.08*  --     Estimated Creatinine Clearance: 56.9 mL/min (by C-G formula based on Cr of 1.08).   Medications:  Scheduled:  . amLODipine  5 mg Oral Daily  . antiseptic oral rinse  7 mL Mouth Rinse q12n4p  . chlorhexidine  15 mL Mouth Rinse BID  . docusate sodium  100 mg Oral Daily  . enoxaparin (LOVENOX) injection  130 mg Subcutaneous Q24H  . feeding supplement (ENSURE ENLIVE)  237 mL Oral BID BM  . FLUoxetine  20 mg Oral Daily  . gabapentin  100 mg Oral TID  . insulin aspart  0-9 Units Subcutaneous 6 times per day  . pantoprazole  40 mg Oral Daily    Assessment: 74 yo f admitted 4/30 for abdominal pain. Patient has a history of afib and was found with RLE ischemia and thrombus.  IV heparin has been stopped and pharmacy is consulted to dose Lovenox.  Patient has been receiving 90 mg sq q12h. Cbc has been stable, so will change to daily dosing. Hgb 9, plts 406, no issues/bleeding noted.  Goal of Therapy:  Anti-Xa level 0.6-1 units/ml 4hrs after LMWH dose given Monitor platelets by anticoagulation protocol: Yes   Plan:  Change Lovenox to 130 mg SQ q24h CBC q72h (currently has daily ordered) Monitor hgb/plts, s/s of bleeding, clinical course  Cassie L. Nicole Kindred, PharmD Clinical Pharmacy Resident Pager: [phone] 12/31/2014 9:57 AM

## 2014-12-31 NOTE — Progress Notes (Signed)
Patient's ileostomy bag busted, stool was around the abdominal incision, cleansed with NS and put new wet to dry dressings.  Notified the attending doctor, he wanted to keep an eye on the incision.

## 2015-01-01 ENCOUNTER — Inpatient Hospital Stay (HOSPITAL_COMMUNITY)
Admission: RE | Admit: 2015-01-01 | Discharge: 2015-01-19 | DRG: 560 | Disposition: A | Discharge status: Home Health | Payer: Medicare Other | Source: Intra-hospital | Attending: Physical Medicine & Rehabilitation | Admitting: Physical Medicine & Rehabilitation

## 2015-01-01 ENCOUNTER — Encounter (HOSPITAL_COMMUNITY): Payer: Self-pay | Admitting: Vascular Surgery

## 2015-01-01 DIAGNOSIS — X58XXXA Exposure to other specified factors, initial encounter: Secondary | ICD-10-CM | POA: Diagnosis not present

## 2015-01-01 DIAGNOSIS — F329 Major depressive disorder, single episode, unspecified: Secondary | ICD-10-CM

## 2015-01-01 DIAGNOSIS — Z932 Ileostomy status: Secondary | ICD-10-CM

## 2015-01-01 DIAGNOSIS — D62 Acute posthemorrhagic anemia: Secondary | ICD-10-CM

## 2015-01-01 DIAGNOSIS — E1142 Type 2 diabetes mellitus with diabetic polyneuropathy: Secondary | ICD-10-CM | POA: Diagnosis not present

## 2015-01-01 DIAGNOSIS — C189 Malignant neoplasm of colon, unspecified: Secondary | ICD-10-CM | POA: Diagnosis not present

## 2015-01-01 DIAGNOSIS — R351 Nocturia: Secondary | ICD-10-CM

## 2015-01-01 DIAGNOSIS — Z9049 Acquired absence of other specified parts of digestive tract: Secondary | ICD-10-CM | POA: Diagnosis not present

## 2015-01-01 DIAGNOSIS — Z903 Acquired absence of stomach [part of]: Secondary | ICD-10-CM

## 2015-01-01 DIAGNOSIS — S88111A Complete traumatic amputation at level between knee and ankle, right lower leg, initial encounter: Secondary | ICD-10-CM

## 2015-01-01 DIAGNOSIS — L89159 Pressure ulcer of sacral region, unspecified stage: Secondary | ICD-10-CM | POA: Diagnosis not present

## 2015-01-01 DIAGNOSIS — E119 Type 2 diabetes mellitus without complications: Secondary | ICD-10-CM | POA: Diagnosis not present

## 2015-01-01 DIAGNOSIS — Z9889 Other specified postprocedural states: Secondary | ICD-10-CM | POA: Diagnosis not present

## 2015-01-01 DIAGNOSIS — E876 Hypokalemia: Secondary | ICD-10-CM

## 2015-01-01 DIAGNOSIS — I10 Essential (primary) hypertension: Secondary | ICD-10-CM | POA: Diagnosis not present

## 2015-01-01 DIAGNOSIS — E8770 Fluid overload, unspecified: Secondary | ICD-10-CM

## 2015-01-01 DIAGNOSIS — Z85038 Personal history of other malignant neoplasm of large intestine: Secondary | ICD-10-CM | POA: Diagnosis not present

## 2015-01-01 DIAGNOSIS — M25512 Pain in left shoulder: Secondary | ICD-10-CM

## 2015-01-01 DIAGNOSIS — C799 Secondary malignant neoplasm of unspecified site: Secondary | ICD-10-CM | POA: Diagnosis not present

## 2015-01-01 DIAGNOSIS — S39011A Strain of muscle, fascia and tendon of abdomen, initial encounter: Secondary | ICD-10-CM

## 2015-01-01 DIAGNOSIS — R0902 Hypoxemia: Secondary | ICD-10-CM

## 2015-01-01 DIAGNOSIS — Z4781 Encounter for orthopedic aftercare following surgical amputation: Secondary | ICD-10-CM | POA: Diagnosis present

## 2015-01-01 DIAGNOSIS — Z89511 Acquired absence of right leg below knee: Secondary | ICD-10-CM

## 2015-01-01 DIAGNOSIS — R29898 Other symptoms and signs involving the musculoskeletal system: Secondary | ICD-10-CM | POA: Diagnosis present

## 2015-01-01 DIAGNOSIS — Z89519 Acquired absence of unspecified leg below knee: Secondary | ICD-10-CM | POA: Insufficient documentation

## 2015-01-01 LAB — CBC
HCT: 30.5 % — ABNORMAL LOW (ref 36.0–46.0)
HCT: 21.2 % — ABNORMAL LOW (ref 36.0–46.0)
HEMATOCRIT: 27.8 % — AB (ref 36.0–46.0)
HEMOGLOBIN: 8.8 g/dL — AB (ref 12.0–15.0)
Hemoglobin: 9.9 g/dL — ABNORMAL LOW (ref 12.0–15.0)
Hemoglobin: 6.9 g/dL — CL (ref 12.0–15.0)
MCH: 27 pg (ref 26.0–34.0)
MCH: 26.8 pg (ref 26.0–34.0)
MCH: 26.7 pg (ref 26.0–34.0)
MCHC: 32.5 g/dL (ref 30.0–36.0)
MCHC: 32.5 g/dL (ref 30.0–36.0)
MCHC: 31.7 g/dL (ref 30.0–36.0)
MCV: 83.3 fL (ref 78.0–100.0)
MCV: 82.5 fL (ref 78.0–100.0)
MCV: 84.2 fL (ref 78.0–100.0)
PLATELETS: 458 10*3/uL — AB (ref 150–400)
Platelets: 309 10*3/uL (ref 150–400)
Platelets: 359 10*3/uL (ref 150–400)
RBC: 3.66 MIL/uL — AB (ref 3.87–5.11)
RBC: 2.57 MIL/uL — ABNORMAL LOW (ref 3.87–5.11)
RBC: 3.3 MIL/uL — AB (ref 3.87–5.11)
RDW: 18.2 % — ABNORMAL HIGH (ref 11.5–15.5)
RDW: 18.1 % — AB (ref 11.5–15.5)
RDW: 18.1 % — AB (ref 11.5–15.5)
WBC: 13.2 10*3/uL — ABNORMAL HIGH (ref 4.0–10.5)
WBC: 14.9 10*3/uL — ABNORMAL HIGH (ref 4.0–10.5)
WBC: 12.4 10*3/uL — AB (ref 4.0–10.5)

## 2015-01-01 LAB — GLUCOSE, CAPILLARY
GLUCOSE-CAPILLARY: 117 mg/dL — AB (ref 65–99)
GLUCOSE-CAPILLARY: 111 mg/dL — AB (ref 65–99)
Glucose-Capillary: 100 mg/dL — ABNORMAL HIGH (ref 65–99)
Glucose-Capillary: 110 mg/dL — ABNORMAL HIGH (ref 65–99)
Glucose-Capillary: 82 mg/dL (ref 65–99)
Glucose-Capillary: 89 mg/dL (ref 65–99)

## 2015-01-01 LAB — CREATININE, SERUM
Creatinine, Ser: 1.02 mg/dL — ABNORMAL HIGH (ref 0.44–1.00)
GFR calc Af Amer: >60 mL/min (ref >60)
GFR calc non Af Amer: 53 mL/min — ABNORMAL LOW (ref >60)

## 2015-01-01 MED ORDER — ALUM & MAG HYDROXIDE-SIMETH 200-200-20 MG/5ML PO SUSP: 15.0000 mL | ORAL | Status: DC | PRN

## 2015-01-01 MED ORDER — ENOXAPARIN SODIUM 150 MG/ML ~~LOC~~ SOLN
130.0000 mg | SUBCUTANEOUS | Status: DC
  Filled 2015-01-01: qty 1

## 2015-01-01 MED ORDER — PANTOPRAZOLE SODIUM 40 MG PO TBEC
40.0000 mg | DELAYED_RELEASE_TABLET | Freq: Every day | ORAL | Status: DC
  Administered 2015-01-02 – 2015-01-19 (×18): 40 mg via ORAL
  Filled 2015-01-01 (×18): qty 1

## 2015-01-01 MED ORDER — GABAPENTIN 100 MG PO CAPS
100.0000 mg | ORAL_CAPSULE | Freq: Three times a day (TID) | ORAL | Status: DC
  Administered 2015-01-01 – 2015-01-19 (×54): 100 mg via ORAL
  Filled 2015-01-01 (×58): qty 1

## 2015-01-01 MED ORDER — FLUOXETINE HCL 20 MG/5ML PO SOLN
20.0000 mg | Freq: Every day | ORAL | Status: DC
  Administered 2015-01-02 – 2015-01-03 (×2): 20 mg via ORAL
  Filled 2015-01-01 (×3): qty 5

## 2015-01-01 MED ORDER — ONDANSETRON HCL 4 MG PO TABS: 4.0000 mg | ORAL_TABLET | Freq: Four times a day (QID) | ORAL | Status: DC | PRN

## 2015-01-01 MED ORDER — PANTOPRAZOLE SODIUM 40 MG PO TBEC: 40.0000 mg | DELAYED_RELEASE_TABLET | Freq: Every day | ORAL | Status: DC

## 2015-01-01 MED ORDER — DOCUSATE SODIUM 100 MG PO CAPS: 100.0000 mg | ORAL_CAPSULE | Freq: Every day | ORAL | Status: AC

## 2015-01-01 MED ORDER — ACETAMINOPHEN 325 MG PO TABS
325.0000 mg | ORAL_TABLET | ORAL | Status: DC | PRN
  Administered 2015-01-15 (×2): 650 mg via ORAL
  Filled 2015-01-01 (×3): qty 2

## 2015-01-01 MED ORDER — ENOXAPARIN SODIUM 100 MG/ML ~~LOC~~ SOLN: 130.0000 mg | SUBCUTANEOUS | Status: DC

## 2015-01-01 MED ORDER — ACETAMINOPHEN 650 MG RE SUPP: 325.0000 mg | RECTAL | Status: DC | PRN

## 2015-01-01 MED ORDER — DOCUSATE SODIUM 100 MG PO CAPS
100.0000 mg | ORAL_CAPSULE | Freq: Every day | ORAL | Status: DC
  Administered 2015-01-02 – 2015-01-19 (×16): 100 mg via ORAL
  Filled 2015-01-01 (×19): qty 1

## 2015-01-01 MED ORDER — AMLODIPINE BESYLATE 5 MG PO TABS: 5.0000 mg | ORAL_TABLET | Freq: Every day | ORAL | Status: DC

## 2015-01-01 MED ORDER — ENSURE ENLIVE PO LIQD
237.0000 mL | Freq: Two times a day (BID) | ORAL | Status: DC
  Administered 2015-01-02 – 2015-01-18 (×18): 237 mL via ORAL

## 2015-01-01 MED ORDER — ONDANSETRON HCL 4 MG/2ML IJ SOLN: 4.0000 mg | Freq: Four times a day (QID) | INTRAMUSCULAR | Status: DC | PRN

## 2015-01-01 MED ORDER — GABAPENTIN 100 MG PO CAPS: 100.0000 mg | ORAL_CAPSULE | Freq: Three times a day (TID) | ORAL | Status: DC

## 2015-01-01 MED ORDER — SORBITOL 70 % SOLN: 30.0000 mL | Freq: Every day | Status: DC | PRN

## 2015-01-01 MED ORDER — PHENOL 1.4 % MT LIQD: 1.0000 | OROMUCOSAL | Status: DC | PRN

## 2015-01-01 MED ORDER — ENOXAPARIN SODIUM 150 MG/ML ~~LOC~~ SOLN
130.0000 mg | SUBCUTANEOUS | Status: DC
  Administered 2015-01-02 – 2015-01-04 (×3): 130 mg via SUBCUTANEOUS
  Filled 2015-01-01 (×4): qty 1

## 2015-01-01 MED ORDER — AMLODIPINE BESYLATE 5 MG PO TABS
5.0000 mg | ORAL_TABLET | Freq: Every day | ORAL | Status: DC
  Administered 2015-01-02 – 2015-01-19 (×18): 5 mg via ORAL
  Filled 2015-01-01 (×20): qty 1

## 2015-01-01 MED ORDER — FENTANYL CITRATE (PF) 100 MCG/2ML IJ SOLN: 12.5000 ug | INTRAMUSCULAR | Status: DC | PRN

## 2015-01-01 MED ORDER — ACETAMINOPHEN 325 MG PO TABS: 325.0000 mg | ORAL_TABLET | ORAL | Status: AC | PRN

## 2015-01-01 MED ORDER — ENSURE ENLIVE PO LIQD: 237.0000 mL | Freq: Two times a day (BID) | ORAL | Status: DC

## 2015-01-01 MED ORDER — FLUOXETINE HCL 20 MG/5ML PO SOLN: 20.0000 mg | Freq: Every day | ORAL | Status: DC

## 2015-01-01 MED ORDER — GUAIFENESIN-DM 100-10 MG/5ML PO SYRP: 15.0000 mL | ORAL_SOLUTION | ORAL | Status: DC | PRN

## 2015-01-01 MED ORDER — ALBUTEROL SULFATE (2.5 MG/3ML) 0.083% IN NEBU: 2.5000 mg | INHALATION_SOLUTION | RESPIRATORY_TRACT | Status: DC | PRN

## 2015-01-01 MED ORDER — INSULIN ASPART 100 UNIT/ML ~~LOC~~ SOLN
0.0000 [IU] | SUBCUTANEOUS | Status: DC
  Administered 2015-01-02 – 2015-01-03 (×3): 1 [IU] via SUBCUTANEOUS

## 2015-01-01 MED ORDER — OXYCODONE-ACETAMINOPHEN 5-325 MG PO TABS: 1.0000 | ORAL_TABLET | ORAL | Status: DC | PRN

## 2015-01-01 MED ORDER — OXYCODONE-ACETAMINOPHEN 5-325 MG PO TABS
1.0000 | ORAL_TABLET | ORAL | Status: DC | PRN
  Administered 2015-01-01: 2 via ORAL
  Administered 2015-01-01 – 2015-01-02 (×2): 1 via ORAL
  Administered 2015-01-02: 2 via ORAL
  Administered 2015-01-02: 1 via ORAL
  Administered 2015-01-02 – 2015-01-19 (×66): 2 via ORAL
  Filled 2015-01-01: qty 2
  Filled 2015-01-01: qty 1
  Filled 2015-01-01 (×7): qty 2
  Filled 2015-01-01: qty 1
  Filled 2015-01-01 (×5): qty 2
  Filled 2015-01-01: qty 1
  Filled 2015-01-01 (×8): qty 2
  Filled 2015-01-01: qty 1
  Filled 2015-01-01 (×5): qty 2
  Filled 2015-01-01: qty 1
  Filled 2015-01-01 (×42): qty 2

## 2015-01-01 NOTE — Progress Notes (Signed)
Subjective:    The patient reports in her right lower extremity burning and discomfort. She reports a poor appetite this morning, but she ate well yesterday. She has minimal abdominal pain.  Interval Events: -Afebrile, vital signs stable.  -Peripheral IV inserted and central line removed. -Colonoscopy bag ruptured, abdominal dressings changed and cleaned.    Objective:    Vital Signs:   Temp:  [97.6 F (36.4 C)-98.6 F (37 C)] 97.6 F (36.4 C) (05/16 0953) Pulse Rate:  [80-92] 92 (05/16 0953) Resp:  [18-19] 19 (05/16 0953) BP: (128-140)/(54-64) 140/62 mmHg (05/16 0953) SpO2:  [98 %-100 %] 100 % (05/16 0953) Last BM Date: 12/29/14  24-hour weight change: Weight change:   Intake/Output:   Intake/Output Summary (Last 24 hours) at 01/01/15 1020 Last data filed at 01/01/15 0831  Gross per 24 hour  Intake    360 ml  Output   1330 ml  Net   -970 ml      Physical Exam: General: Well-developed, well-nourished, in no acute distress; alert, appropriate and cooperative throughout examination.  Lungs:  Normal respiratory effort. Clear to auscultation BL without crackles or wheezes.  Heart: RRR. S1 and S2 normal without gallop, murmur, or rubs.  Abdomen:  Surgical incisions dressed, clean dry, and intact.  Colostomy bag in RLQ with brown stool.  No tenderness to palpation.  Extremities: Status post R BKA with wound dressing in place, nontender.   Labs:  Basic Metabolic Panel:  Recent Labs Lab 12/26/14 0230  12/26/14 0820 12/27/14 0701 12/28/14 7846 12/29/14 0450 12/30/14 0614  NA  --   --  140 139 139 136 136  K  --   --  3.5 3.5 3.3* 3.5 3.6  CL  --   --  106 106 106 102 103  CO2  --   --  28 27 27 26 26   GLUCOSE  --   --  128* 118* 114* 100* 104*  BUN  --   --  21* 18 14 9 11   CREATININE  --   --  1.28* 1.18* 1.06* 1.06* 1.08*  CALCIUM  --   < > 7.1* 7.2* 7.2* 7.4* 7.4*  MG 1.9  --   --   --   --   --   --   < > = values in this interval not  displayed.  CBC:  Recent Labs Lab 12/28/14 1730 12/29/14 0450 12/30/14 0614 12/31/14 0626 01/01/15 0430  WBC 22.5* 20.3* 20.5* 15.5* 13.2*  HGB 9.6* 9.8* 9.4* 9.0* 9.9*  HCT 30.5* 30.9* 30.3* 28.7* 30.5*  MCV 82.0 82.8 83.7 84.4 83.3  PLT 406* 411* 406* 406* 309   CBG:  Recent Labs Lab 12/31/14 1639 12/31/14 2117 01/01/15 0018 01/01/15 0431 01/01/15 0743  GLUCAP 119* 104* 100* 82 40    Microbiology: Results for orders placed or performed during the hospital encounter of 12/16/14  Surgical pcr screen     Status: None   Collection Time: 12/17/14  6:20 AM  Result Value Ref Range Status   MRSA, PCR NEGATIVE NEGATIVE Final   Staphylococcus aureus NEGATIVE NEGATIVE Final    Comment:        The Xpert SA Assay (FDA approved for NASAL specimens in patients over 24 years of age), is one component of a comprehensive surveillance program.  Test performance has been validated by [facility] for patients greater than or equal to 55 year old. It is not intended to diagnose infection nor to guide or monitor treatment.  Surgical pcr screen     Status: None   Collection Time: 12/17/14 11:45 AM  Result Value Ref Range Status   MRSA, PCR NEGATIVE NEGATIVE Final   Staphylococcus aureus NEGATIVE NEGATIVE Final    Comment:        The Xpert SA Assay (FDA approved for NASAL specimens in patients over 43 years of age), is one component of a comprehensive surveillance program.  Test performance has been validated by [facility] for patients greater than or equal to 2 year old. It is not intended to diagnose infection nor to guide or monitor treatment.   Culture, blood (routine x 2)     Status: None   Collection Time: 12/17/14  4:25 PM  Result Value Ref Range Status   Specimen Description BLOOD LEFT WRIST  Final   Special Requests BOTTLES DRAWN AEROBIC ONLY 1CC  Final   Culture   Final    NO GROWTH 5 DAYS Note: Culture results may be compromised due to an inadequate  volume of blood received in culture bottles. Performed at [facility]    Report Status 12/23/2014 FINAL  Final  Culture, blood (routine x 2)     Status: None   Collection Time: 12/17/14  4:28 PM  Result Value Ref Range Status   Specimen Description BLOOD CENTRAL LINE  Final   Special Requests BOTTLES DRAWN AEROBIC ONLY 10CC  Final   Culture   Final    NO GROWTH 5 DAYS Performed at [facility]    Report Status 12/23/2014 FINAL  Final   Imaging: No results found.     Medications:    Infusions: . sodium chloride 20 mL/hr (12/29/14 1511)    Scheduled Medications: . amLODipine  5 mg Oral Daily  . antiseptic oral rinse  7 mL Mouth Rinse q12n4p  . chlorhexidine  15 mL Mouth Rinse BID  . docusate sodium  100 mg Oral Daily  . enoxaparin (LOVENOX) injection  130 mg Subcutaneous Q24H  . feeding supplement (ENSURE ENLIVE)  237 mL Oral BID BM  . FLUoxetine  20 mg Oral Daily  . gabapentin  100 mg Oral TID  . insulin aspart  0-9 Units Subcutaneous 6 times per day  . pantoprazole  40 mg Oral Daily    PRN Medications: acetaminophen **OR** acetaminophen, albuterol, alum & mag hydroxide-simeth, fentaNYL (SUBLIMAZE) injection, guaiFENesin-dextromethorphan, hydrALAZINE, labetalol, magnesium sulfate 1 - 4 g bolus IVPB, metoprolol, ondansetron, oxyCODONE-acetaminophen, phenol, potassium chloride, sodium chloride   Assessment/ Plan:    Principal Problem:   Colon cancer metastasized to multiple sites Active Problems:   Bowel obstruction   Colonic mass   S/P partial colectomy   Septic shock   Severe sepsis with acute organ dysfunction   Noncompliance with medication regimen   Acute respiratory failure with hypoxia   Thrombus   Acute renal failure syndrome   Peritonitis   Acute blood loss anemia   Melena   Diabetes type 2, controlled   Hypokalemia   Hypomagnesemia   Depression   Abdominal pain   Pain in joint, lower leg   Palliative care encounter  [phone]  (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 Pain controlled with fentanyl and Percocet. -Outpatient oncology follow-up. -Carb modified diet. -Continue Percocet 5-325, 1-2 tabs every 4 hours as needed. -Continue fentanyl 12.5-25 g IV every 2 hours needed. -Continue Colace 100 mg daily. -Continue work with PT and OT. -Discharge to  CIR today.  #Status post right BKA for ischemic right foot secondary to thrombus from atrial fibrillation Phantom limb pain improved after gabapentin. -Continue Lovenox 90 mg every 12 hours. -Pain medications as above. -Continue gabapentin 100 mg 3 times a day. -Continue to work with PT. -Daily dressing changes per vascular surgery. -Premedication with opioids prior to dressing changes.  #Septic shock secondary to GI perforation White blood cell count continues to trend down. -Continue to monitor for signs of infection.  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  #Acute blood loss anemia Hemoglobin improved today. -Continue to monitor hemoglobin at CIR.  #Hypertension Normotensive. -Continue amlodipine 5 mg daily. -Continue hydralazine 10 mg when necessary for SBP greater than 160.  #Type 2 diabetes Blood sugar control stable. Very rarely receiving insulin. -Continue CBGs and sliding scale insulin sensitive ACHS. -Discontinue at discharge.  #Indigestion -Continue Protonix 40 mg daily. -Maalox every 2 hours when necessary.  #Depression -Continue Prozac 20 mg daily.   DVT PPX - heparin  CODE STATUS - DNR  CONSULTS PLACED - PCCM, Oncology, General Surgery, Vascular Surgery, GI, palliative care.  DISPO - Discharge today to CIR.  The patient does not have a current PCP (No PCP Per Patient) and does need an [facility] hospital follow-up appointment after discharge.    Is the [facility] hospital follow-up appointment a one-time  only appointment? yes.  Does the patient have transportation limitations that hinder transportation to clinic appointments? yes   SERVICE NEEDED AT [place]         Y = Yes, Blank = No PT: CIR  OT: CIR  RN:   Equipment:   Other:      Length of Stay: 16 day(s)   Signed: Charlesetta Shanks, MD  PGY-1, Internal Medicine Resident Pager: [phone] (7AM-5PM) 01/01/2015, 10:20 AM

## 2015-01-01 NOTE — Progress Notes (Signed)
   Daily Progress Note  Assessment/Planning: POD #4 s/p R BKA   Stump viable without drainage  ABD pad to incision with stump sock  Follow up in office in 4 weeks for staple removal ([phone])  Subjective  - 4 Days Post-Op  Pain better today  Objective Filed Vitals:   12/31/14 1000 12/31/14 1711 12/31/14 2112 01/01/15 0433  BP: 148/67 136/64 128/54 136/55  Pulse: 93 88 80 88  Temp: 98.2 F (36.8 C) 98.6 F (37 C) 97.8 F (36.6 C) 97.6 F (36.4 C)  TempSrc: Oral Oral Oral Oral  Resp: 18 18 18 18   Height:      Weight:      SpO2: 98% 98% 98% 100%    Intake/Output Summary (Last 24 hours) at 01/01/15 0910 Last data filed at 01/01/15 0831  Gross per 24 hour  Intake    360 ml  Output   1330 ml  Net   -970 ml    VASC  Viable R BKA stump with no drainage from staple line  Laboratory CBC    Component Value Date/Time   WBC 13.2* 01/01/2015 0430   HGB 9.9* 01/01/2015 0430   HCT 30.5* 01/01/2015 0430   PLT 309 01/01/2015 0430    BMET    Component Value Date/Time   NA 136 12/30/2014 0614   K 3.6 12/30/2014 0614   CL 103 12/30/2014 0614   CO2 26 12/30/2014 0614   GLUCOSE 104* 12/30/2014 0614   BUN 11 12/30/2014 0614   CREATININE 1.08* 12/30/2014 0614   CALCIUM 7.4* 12/30/2014 0614   GFRNONAA 50* 12/30/2014 [place]* 12/30/2014 [place], MD [facility] Office: [phone] Pager: [phone]  01/01/2015, 9:10 AM

## 2015-01-01 NOTE — Progress Notes (Signed)
Rehab admissions - I am following pt's case and spoke with Dr. Trudee Kuster, internal medicine. We have an available rehab bed today and I received medical clearance. We will admit pt to inpatient rehab today.  I called and updated pt who is pleased with this plan. I will meet with her later this am to complete admission paperwork. I left messages with Malachy Mood, case Freight forwarder and Lorriane Shire, Education officer, museum.  Please call me with any questions. Thanks.  Nanetta Batty, PT Rehabilitation Admissions Coordinator [phone]

## 2015-01-01 NOTE — Progress Notes (Signed)
  Progress Note    01/01/2015 8:10 AM 4 Days Post-Op  Subjective:  Wants her bed bath; states her colostomy bag "exploded yesterday and got in her wound" (pointing to abdomen).  afebreile  Filed Vitals:   01/01/15 0433  BP: 136/55  Pulse: 88  Temp: 97.6 F (36.4 C)  Resp: 18    Physical Exam: Incisions:  C/d/i and healing nicely with staples in tact Extremities:  Able to lift leg off the bed  CBC    Component Value Date/Time   WBC 13.2* 01/01/2015 0430   RBC 3.66* 01/01/2015 0430   RBC 4.00 12/16/2014 1632   HGB 9.9* 01/01/2015 0430   HCT 30.5* 01/01/2015 0430   PLT 309 01/01/2015 0430   MCV 83.3 01/01/2015 0430   MCH 27.0 01/01/2015 0430   MCHC 32.5 01/01/2015 0430   RDW 18.2* 01/01/2015 0430   LYMPHSABS 1.4 12/25/2014 0458   MONOABS 1.0 12/25/2014 0458   EOSABS 0.2 12/25/2014 0458   BASOSABS 0.0 12/25/2014 0458    BMET    Component Value Date/Time   NA 136 12/30/2014 0614   K 3.6 12/30/2014 0614   CL 103 12/30/2014 0614   CO2 26 12/30/2014 0614   GLUCOSE 104* 12/30/2014 0614   BUN 11 12/30/2014 0614   CREATININE 1.08* 12/30/2014 0614   CALCIUM 7.4* 12/30/2014 0614   GFRNONAA 50* 12/30/2014 0614   GFRAA 58* 12/30/2014 0614    INR    Component Value Date/Time   INR 1.21 12/28/2014 0612     Intake/Output Summary (Last 24 hours) at 01/01/15 0810 Last data filed at 01/01/15 0700  Gross per 24 hour  Intake    600 ml  Output   1080 ml  Net   -480 ml     Assessment/Plan:  74 y.o. female is s/p right below knee amputation  4 Days Post-Op  -pt doing well from vascular surgery standpoint -stump is viable -return to see Dr. Bridgett Larsson in 3-4 weeks for staple removal.   Leontine Locket, PA-C [facility] [phone] 01/01/2015 8:10 AM

## 2015-01-01 NOTE — H&P (Signed)
Physical Medicine and Rehabilitation Admission H&P   Chief Complaint  Patient presents with  . Abdominal Pain  : HPI: Kathryn Knapp is a 74 y.o. right handed female with history of hypertension as well as diabetes mellitus. Independent prior to admission living with her daughter and grandchildren. Presented 12/16/2014 with nonspecific abdominal pain as well as nausea vomiting. He also had some recent weight loss. CT abdomen revealed a mass in the splenic flexure as well as nodule left adrenal gland. Abdominal pain progressed follow-up scan revealed some free fluid and air. Underwent exploratory laparotomy right transverse and partial left colectomy with en block partial gastrectomy, and ileostomy 12/17/2014 per Dr. Grandville Silos. Pathology report adenocarcinoma with metastases and await plan of care to be established as outpatient with oncology services Dr Burr Medico. Patient required ventilatory management followed by critical care postoperatively. Wound care nurse consulted for sacral decubitus with dressing change recommendations. She was extubated 12/21/2014 with slow progress. Palliative care consulted 12/27/2014. 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 Bridgett Larsson. Acute blood loss anemia 7.2-9.6 and monitored. Physical and occupational therapy evaluations completed with recommendations of physical medicine rehabilitation consult. Patient was admitted for a comprehensive rehabilitation program  ROS Review of Systems  Gastrointestinal: Positive for nausea, vomiting and abdominal pain   Past Medical History  Diagnosis Date  . Diabetes mellitus without complication   . Hypertension    Past Surgical History  Procedure Laterality Date  . Cholecystectomy     . Abdominal hysterectomy    . Laparotomy N/A 12/17/2014    Procedure: EXPLORATORY LAPAROTOMY Right, Transverse, partial left colectomy with en bloc partial gastrectomy illeostomy; Surgeon: Georganna Skeans, MD; Location: [place]; Service: General; Laterality: N/A;   History reviewed. No pertinent family history. Social History:  reports that she has never smoked. She does not have any smokeless tobacco history on file. She reports that she does not drink alcohol or use illicit drugs. Allergies: No Known Allergies Medications Prior to Admission  Medication Sig Dispense Refill  . ibuprofen (ADVIL,MOTRIN) 200 MG tablet Take 200 mg by mouth every 6 (six) hours as needed for mild pain or moderate pain.     Marland Kitchen HYDROcodone-acetaminophen (NORCO/VICODIN) 5-325 MG per tablet Take 1 tablet by mouth every 6 (six) hours as needed for moderate pain. (Patient not taking: Reported on 12/16/2014) 8 tablet 0  . ibuprofen (ADVIL,MOTRIN) 800 MG tablet Take 1 tablet (800 mg total) by mouth every 8 (eight) hours as needed. (Patient not taking: Reported on 12/16/2014) 21 tablet 0    Home: Home Living Family/patient expects to be discharged to:: Private residence Living Arrangements: Children, Other relatives (3 young grandchildren) Available Help at Discharge: Family, Available 24 hours/day Type of Home: Mobile home Home Access: Stairs to enter [employer] of Steps: 4 Entrance Stairs-Rails: Right, Left, Can reach both Home Layout: One level Home Equipment: None Additional Comments: pt is at risk for losing her home, case management is involved  Functional History: Prior Function Level of Independence: Independent  Functional Status:  Mobility: Bed Mobility Overal bed mobility: Needs Assistance Bed Mobility: Supine to Sit, Sit to Supine Rolling: Min assist Supine to sit: Mod assist, HOB elevated, +2 for safety/equipment Sit to supine: +2 for physical  assistance, Mod assist, HOB elevated General bed mobility comments: assist to raise trunk and advance hips to  EOB with pad, assist for L LE and guided shoulders back to supine, +2 max to pull up in bed with pt assisting by pulling up on headboard. Transfers Overall transfer level: Needs assistance Equipment used: (requires lift equipment) General transfer comment: nursing using lift equipment      ADL: ADL Overall ADL's : Needs assistance/impaired Eating/Feeding: Set up, Bed level Grooming: Wash/dry hands, Wash/dry face, Set up, Brushing hair, Maximal assistance, Sitting, Bed level Upper Body Bathing: Moderate assistance, Sitting Lower Body Bathing: Total assistance, Bed level Upper Body Dressing : Minimal assistance, Sitting Lower Body Dressing: Total assistance, Bed level Toilet Transfer Details (indicate cue type and reason): assisted to roll to R side for bedpan Toileting- Clothing Manipulation and Hygiene: Total assistance, Bed level  Cognition: Cognition Overall Cognitive Status: Within Functional Limits for tasks assessed Orientation Level: Oriented X4 Cognition Arousal/Alertness: Awake/alert Behavior During Therapy: WFL for tasks assessed/performed Overall Cognitive Status: Within Functional Limits for tasks assessed  Physical Exam: Blood pressure 136/55, pulse 88, temperature 97.6 F (36.4 C), temperature source Oral, resp. rate 18, height _0  (1.803 m), weight 88.1 kg (194 lb 3.6 oz), SpO2 100 %. Physical Exam Constitutional: She is oriented to person, place, and time.  HENT: edentulous, oral mucosa moist/pink Head: Normocephalic.  Eyes: EOM are normal.  Neck: Normal range of motion. Neck supple. No thyromegaly present.  Cardiovascular: Normal rate and regular rhythm. no murmurs Respiratory: Effort normal and breath sounds normal. No respiratory distress.  GI: Soft. Bowel sounds are normal. She exhibits no distension. Abdomen is tender to  palpation. Neurological: She is alert and oriented to person, place, and time.  Alert and appropriate. Followed basic commands. Has reasonable insight and awareness. Bilateral UE: 3+ deltoid, bicep, tricep and 4/5 wrist/hands. LLE: 2/5 HF, KE and 3+ to 4-/5 ankles. RLE: limited due to pain. Right hip flexion 1-2/5.  Skin: large abdominal incision clean, granulating,large dressing in place. Ostomy in RLQ sealed and intact with formed stool and no excess gas. Sacral and buttock wounds dressed. Amputation site is intact with mild s/s drainage. Psychiatric: She has a normal mood and affect. Her behavior is normal. Thought content normal    Lab Results Last 48 Hours    Results for orders placed or performed during the hospital encounter of 12/16/14 (from the past 48 hour(s))  CBC Status: Abnormal   Collection Time: 12/30/14 6:14 AM  Result Value Ref Range   WBC 20.5 (H) 4.0 - 10.5 K/uL   RBC 3.62 (L) 3.87 - 5.11 MIL/uL   Hemoglobin 9.4 (L) 12.0 - 15.0 g/dL   HCT 30.3 (L) 36.0 - 46.0 %   MCV 83.7 78.0 - 100.0 fL   MCH 26.0 26.0 - 34.0 pg   MCHC 31.0 30.0 - 36.0 g/dL   RDW 17.9 (H) 11.5 - 15.5 %   Platelets 406 (H) 150 - 400 K/uL  Basic metabolic panel Status: Abnormal   Collection Time: 12/30/14 6:14 AM  Result Value Ref Range   Sodium 136 135 - 145 mmol/L   Potassium 3.6 3.5 - 5.1 mmol/L   Chloride 103 101 - 111 mmol/L   CO2 26 22 - 32 mmol/L   Glucose, Bld 104 (H) 65 - 99 mg/dL   BUN 11 6 - 20 mg/dL   Creatinine, Ser 1.08 (H) 0.44 - 1.00 mg/dL   Calcium 7.4 (L) 8.9 - 10.3 mg/dL   GFR calc non Af Amer 50 (L) >60 mL/min   GFR calc Af Amer 58 (L) >60 mL/min  Comment: (NOTE) The eGFR has been calculated using the CKD EPI equation. This calculation has not been validated in all clinical situations. eGFR's persistently <60 mL/min signify possible Chronic Kidney Disease.    Anion  gap 7 5 - 15  Glucose, capillary Status: None   Collection Time: 12/30/14 7:41 AM  Result Value Ref Range   Glucose-Capillary 92 65 - 99 mg/dL  Glucose, capillary Status: Abnormal   Collection Time: 12/30/14 11:45 AM  Result Value Ref Range   Glucose-Capillary 108 (H) 65 - 99 mg/dL  Glucose, capillary Status: Abnormal   Collection Time: 12/30/14 4:31 PM  Result Value Ref Range   Glucose-Capillary 140 (H) 65 - 99 mg/dL  Glucose, capillary Status: Abnormal   Collection Time: 12/30/14 8:04 PM  Result Value Ref Range   Glucose-Capillary 107 (H) 65 - 99 mg/dL  Glucose, capillary Status: Abnormal   Collection Time: 12/31/14 12:00 AM  Result Value Ref Range   Glucose-Capillary 101 (H) 65 - 99 mg/dL  Glucose, capillary Status: None   Collection Time: 12/31/14 4:11 AM  Result Value Ref Range   Glucose-Capillary 94 65 - 99 mg/dL  CBC Status: Abnormal   Collection Time: 12/31/14 6:26 AM  Result Value Ref Range   WBC 15.5 (H) 4.0 - 10.5 K/uL   RBC 3.40 (L) 3.87 - 5.11 MIL/uL   Hemoglobin 9.0 (L) 12.0 - 15.0 g/dL   HCT 28.7 (L) 36.0 - 46.0 %   MCV 84.4 78.0 - 100.0 fL   MCH 26.5 26.0 - 34.0 pg   MCHC 31.4 30.0 - 36.0 g/dL   RDW 18.2 (H) 11.5 - 15.5 %   Platelets 406 (H) 150 - 400 K/uL  Glucose, capillary Status: None   Collection Time: 12/31/14 8:06 AM  Result Value Ref Range   Glucose-Capillary 89 65 - 99 mg/dL  Glucose, capillary Status: Abnormal   Collection Time: 12/31/14 11:53 AM  Result Value Ref Range   Glucose-Capillary 158 (H) 65 - 99 mg/dL  Glucose, capillary Status: Abnormal   Collection Time: 12/31/14 4:39 PM  Result Value Ref Range   Glucose-Capillary 119 (H) 65 - 99 mg/dL  Glucose, capillary Status: Abnormal   Collection Time: 12/31/14 9:17 PM  Result Value Ref Range    Glucose-Capillary 104 (H) 65 - 99 mg/dL  Glucose, capillary Status: Abnormal   Collection Time: 01/01/15 12:18 AM  Result Value Ref Range   Glucose-Capillary 100 (H) 65 - 99 mg/dL      Imaging Results (Last 48 hours)    No results found.       Medical Problem List and Plan: 1. 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 DVT Prophylaxis/Anticoagulation: Subcutaneous Lovenox. Monitor platelet counts and any signs of bleeding 3. Pain Management: Neurontin 100 mg 3 times a day, Oxycodone. Monitor with increased mobility 4. Mood/depression: Prozac 20 mg daily. 5. Neuropsych: This patient is capable of making decisions on her own behalf. 6. Skin/Wound Care: Local care to right BKA site as well as abdominal wound/ostomy and deep tissue injury to buttocks and sacrum.  WOC RN following for wound care. Has had some issues with seal of ostomy 7. Fluids/Electrolytes/Nutrition: Routine I nose with follow-up chemistries 8. Acute blood loss anemia. Follow-up CBC tonight 9. Diabetes mellitus with peripheral neuropathy. Hemoglobin A1c 6.3. Sliding scale insulin. Check blood sugars before meals and at bedtime 10. Hypertension. Norvasc 5 mg daily.   [place] Admission Physician  Evaluation: 1. Functional deficits secondary to right BKA, debility related to metastatic colon ca. 2. Patient is admitted to receive collaborative, interdisciplinary care between the physiatrist, rehab nursing staff, and therapy team. 3. Patient's level of medical complexity and substantial therapy needs in context of that medical necessity cannot be provided at a lesser intensity of care such as a SNF. 4. Patient has experienced substantial functional loss from his/her baseline which was documented above under the "Functional History" and "Functional Status" headings.  Judging by the patient's diagnosis, physical exam, and functional history, the patient has potential for functional progress which will result in measurable gains while on inpatient rehab. These gains will be of substantial and practical use upon discharge in facilitating mobility and self-care at the household level. 5. Physiatrist will provide 24 hour management of medical needs as well as oversight of the therapy plan/treatment and provide guidance as appropriate regarding the interaction of the two. 6. 24 hour rehab nursing will assist with bladder management, bowel management, safety, skin/wound care, disease management, medication administration, pain management and patient education and help integrate therapy concepts, techniques,education, etc. 7. PT will assess and treat for/with: Lower extremity strength, range of motion, stamina, balance, functional mobility, safety, adaptive techniques and equipment, pain mgt, ego support, activity tolerance. Goals are: supervision to min assist. 8. OT will assess and treat for/with: ADL's, functional mobility, safety, upper extremity strength, adaptive techniques and equipment, pain mgt, activity tolerance, ego support, family education. Goals are: supervision to min assist. Therapy may not proceed with showering this patient. 9. SLP will assess and treat for/with: n/a. Goals are: n/a. 10. Case Management and Social Worker will assess and treat for psychological issues and discharge planning. 11. Team conference will be held weekly to assess progress toward goals and to determine barriers to discharge. 12. Patient will receive at least 3 hours of therapy per day at least 5 days per week. 13. ELOS: 11-16 days  14. Prognosis: excellent     Meredith Staggers, MD, [place] [facility] 01/01/2015

## 2015-01-01 NOTE — Progress Notes (Signed)
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 Developed ischemic Rt foot; R BKA 12/28/14 PMHx- DM, HTN   OT comments  Patient progressing towards OT goals, continue plan of care for now. Patient continues to be limited by decreased activity tolerance/endurance, increased pain, decreased dynamic sitting/standing balance/tolerance/endurance. Patient will continue to benefit from CIR to help increase overall independence with BADLs, IADLs, and functional mobility/transfers.    Follow Up Recommendations  CIR;Supervision/Assistance - 24 hour    Equipment Recommendations  Other (comment) (TBD next venue of care)    Recommendations for Other Services  Rehab Consult   Precautions / Restrictions Precautions Precautions: Fall Restrictions Weight Bearing Restrictions: Yes RLE Weight Bearing: Non weight bearing     Mobility Bed Mobility Overal bed mobility: Needs Assistance Bed Mobility: Supine to Sit;Sit to Supine Rolling: Min assist   Supine to sit: Mod assist Sit to supine: Mod assist   General bed mobility comments: Assistance for BLE management. Cues for encouragement, technique and overall safety.   Transfers General transfer comment: did not occur during this OT treat due to decreased endurance while seated EOB for exercise activity.     Balance Overall balance assessment: Needs assistance Sitting-balance support: No upper extremity supported;Single extremity supported Sitting balance-Leahy Scale: Poor   ADL Overall ADL's : Needs assistance/impaired General ADL Comments: Pt continues to require up to total assist  for ADLs. Pt mod assist for bed mobility to sit EOB. Pt rolled left<>right for therapist to remove bed pan. Therapist talked with patient about using BSC instead of bed pan.  Patient with increased complaints of dizziness while seated EOB, but this subsided with time. Patient on 2 liter of 02 via [place] and sats =greather than 95%. Patient sat EOB for 6 minutes for therapeutic exercise focusing on BUE strengthening and overall endurance.      Cognition   Behavior During Therapy: WFL for tasks assessed/performed Overall Cognitive Status: Within Functional Limits for tasks assessed     Exercises General Exercises - Upper Extremity Shoulder Flexion: AROM;Both;10 reps;Seated Shoulder Extension: AROM;Both;10 reps;Seated Elbow Flexion: AROM;Both;10 reps;Seated Elbow Extension: AROM;Both;10 reps;Seated           Pertinent Vitals/ Pain       Pain Assessment: Faces Faces Pain Scale: Hurts little more Pain Location: RLE, abdomen Pain Descriptors / Indicators: Grimacing (during bed mobility activities ) Pain Intervention(s): Limited activity within patient's tolerance;Repositioned         Frequency Min 2X/week     Progress Toward Goals  OT Goals(current goals can now be found in the care plan section)  Progress towards OT goals: Progressing toward goals     Plan Discharge plan remains appropriate       End of Session Equipment Utilized During Treatment: Oxygen   Activity Tolerance Patient tolerated treatment well   Patient Left in bed;with call bell/phone within reach;with nursing/sitter in room     Time: 2841-3244 OT Time Calculation (min): 24 min  Charges: OT General Charges $OT Visit: 1 Procedure OT Treatments $Self Care/Home Management : 8-22 mins $Therapeutic Exercise: 8-22 mins  Karee Christopherson , MS, OTR/L, CLT Pager: [phone]  01/01/2015, 1:09 PM

## 2015-01-01 NOTE — Progress Notes (Signed)
4 Days Post-Op  Subjective: Eating but appetite has been somewhat low  Objective: Vital signs in last 24 hours: Temp:  [97.6 F (36.4 C)-98.6 F (37 C)] 97.6 F (36.4 C) (05/16 0953) Pulse Rate:  [80-93] 92 (05/16 0953) Resp:  [18-19] 19 (05/16 0953) BP: (128-148)/(54-67) 140/62 mmHg (05/16 0953) SpO2:  [98 %-100 %] 100 % (05/16 0953) Last BM Date: 12/29/14  Intake/Output from previous day: 05/15 0701 - 05/16 0700 In: 600 [P.O.:600] Out: 1330 [Urine:880; Stool:450] Intake/Output this shift: Total I/O In: -  Out: 250 [Urine:250]  General appearance: alert and cooperative Resp: clear to auscultation bilaterally GI: soft, midline wound dressed, ileostomy pink with a lot of output  Lab Results:   Recent Labs  12/31/14 0626 01/01/15 0430  WBC 15.5* 13.2*  HGB 9.0* 9.9*  HCT 28.7* 30.5*  PLT 406* 309   BMET  Recent Labs  12/30/14 0614  NA 136  K 3.6  CL 103  CO2 26  GLUCOSE 104*  BUN 11  CREATININE 1.08*  CALCIUM 7.4*   PT/INR No results for input(s): LABPROT, INR in the last 72 hours. ABG No results for input(s): PHART, HCO3 in the last 72 hours.  Invalid input(s): PCO2, PO2  Studies/Results: No results found.  Anti-infectives: Anti-infectives    Start     Dose/Rate Route Frequency Ordered Stop   12/29/14 0000  cefUROXime (ZINACEF) 1.5 g in dextrose 5 % 50 mL IVPB     1.5 g 100 mL/hr over 30 Minutes Intravenous Every 12 hours 12/28/14 1503 12/29/14 1134   12/28/14 0800  cefUROXime (ZINACEF) 1.5 g in dextrose 5 % 50 mL IVPB     1.5 g 100 mL/hr over 30 Minutes Intravenous On call to O.R. 12/25/14 1324 12/28/14 1259   12/22/14 1000  piperacillin-tazobactam (ZOSYN) IVPB 2.25 g     2.25 g 100 mL/hr over 30 Minutes Intravenous Every 6 hours 12/22/14 0949 12/23/14 2230   12/19/14 1600  fluconazole (DIFLUCAN) IVPB 200 mg     200 mg 100 mL/hr over 60 Minutes Intravenous Every 24 hours 12/19/14 0901 12/23/14 1600   12/18/14 1100  fluconazole (DIFLUCAN)  IVPB 400 mg  Status:  Discontinued     400 mg 100 mL/hr over 120 Minutes Intravenous Every 24 hours 12/18/14 1036 12/19/14 0901   12/17/14 1100  piperacillin-tazobactam (ZOSYN) IVPB 3.375 g  Status:  Discontinued     3.375 g 12.5 mL/hr over 240 Minutes Intravenous Every 8 hours 12/17/14 0956 12/22/14 0911   12/17/14 0600  cefoTEtan (CEFOTAN) 2 g in dextrose 5 % 50 mL IVPB     2 g 100 mL/hr over 30 Minutes Intravenous To Surgery 12/17/14 0542 12/17/14 4034      Assessment/Plan: Gastric/colon Adenocarcinoma with mets Perforation of cecum, large obstructing tumor at the splenic flexure with peritoneal metastasis POD#15 exploratory laparotomy right, transverse, partial 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 Grandville Silos  Continue wound care  OK to go to SNF when ready per primary team  We will F/U in the office S/P R BKA - per VVS  LOS: 16 days    Kathryn Knapp E 01/01/2015

## 2015-01-01 NOTE — Discharge Instructions (Addendum)
Thank you for allowing [place] to be involved in your healthcare while you were hospitalized at [facility].   Please note that there have been changes to your home medications.  --> PLEASE LOOK AT YOUR DISCHARGE MEDICATION LIST FOR DETAILS.  Please arrange follow up with oncology after discharge from rehabilitation.  You should also establish with a PCP.  We would be happy to see you in our [facility] clinic.  You can schedule an appointment at [phone].  I have included other PCP options below if you are interested.  [facility], [place] [phone]  OPEN ABDOMINAL SURGERY: POST OP INSTRUCTIONS  Always review your discharge instruction sheet given to you by the facility where your surgery was performed.  IF YOU HAVE DISABILITY OR FAMILY LEAVE FORMS, YOU MUST BRING THEM TO THE OFFICE FOR PROCESSING.  PLEASE DO NOT GIVE THEM TO YOUR DOCTOR.  1. A prescription for pain medication may be given to you upon discharge.  Take your pain medication as prescribed, if needed.  If narcotic pain medicine is not needed, then you may take acetaminophen (Tylenol) or ibuprofen (Advil) as needed. 2. Take your usually prescribed medications unless otherwise directed. 3. If you need a refill on your pain medication, please contact your pharmacy. They will contact our office to request authorization.  Prescriptions will not be filled after 5pm or on week-ends. 4. You should follow a light diet the first few days after arrival home, such as soup and crackers, pudding, etc.unless your doctor has advised otherwise. A high-fiber, low fat diet can be resumed as tolerated.   Be sure to include lots of fluids daily. Most patients will experience some swelling and bruising on the chest and neck area.  Ice packs will help.  Swelling and bruising can take several days to resolve 5. Most patients will experience some swelling and bruising in the area of the incision. Ice pack  will help. Swelling and bruising can take several days to resolve..  6. It is common to experience some constipation if taking pain medication after surgery.  Increasing fluid intake and taking a stool softener will usually help or prevent this problem from occurring.  A mild laxative (Milk of Magnesia or Miralax) should be taken according to package directions if there are no bowel movements after 48 hours. 7.  You may have steri-strips (small skin tapes) in place directly over the incision.  These strips should be left on the skin for 7-10 days.  If your surgeon used skin glue on the incision, you may shower in 24 hours.  The glue will flake off over the next 2-3 weeks.  Any sutures or staples will be removed at the office during your follow-up visit. You may find that a light gauze bandage over your incision may keep your staples from being rubbed or pulled. You may shower and replace the bandage daily. 8. ACTIVITIES:  You may resume regular (light) daily activities beginning the next day--such as daily self-care, walking, climbing stairs--gradually increasing activities as tolerated.  You may have sexual intercourse when it is comfortable.  Refrain from any heavy lifting or straining until approved by your doctor. a. You may drive when you no longer are taking prescription pain medication, you can comfortably wear a seatbelt, and you can safely maneuver your car and apply brakes b. Return to Work: ___________________________________ 46. You should see your doctor in the office for a follow-up appointment approximately  two weeks after your surgery.  Make sure that you call for this appointment within a day or two after you arrive home to insure a convenient appointment time. OTHER INSTRUCTIONS:  _____________________________________________________________ _____________________________________________________________  WHEN TO CALL YOUR DOCTOR: 1. Fever over 101.0 2. Inability to urinate 3. Nausea  and/or vomiting 4. Extreme swelling or bruising 5. Continued bleeding from incision. 6. Increased pain, redness, or drainage from the incision. 7. Difficulty swallowing or breathing 8. Muscle cramping or spasms. 9. Numbness or tingling in hands or feet or around lips.  The clinic staff is available to answer your questions during regular business hours.  Please dont hesitate to call and ask to speak to one of the nurses if you have concerns.  For further questions, please visit [email or web address]  We recommend that you establish with a PCP.  Here are some options for primary care doctors that you can consider contacting:  [facility] Address: [address] [place], [place] Phone: [phone]  [facility] @ [facility] Address: [address] [address] [address] Phone: [phone]

## 2015-01-01 NOTE — Care Management Note (Signed)
Case Management Note  Patient Details  Name: Kathryn Knapp MRN: [id number] Date of Birth: 11-15-1940  Subjective/Objective:                 CM following for progression and d/c planning.   Action/Plan: Met with pt and family member, plan is to d/c to CIR today 01/01/15 for inpt rehab.   Expected Discharge Date:                  Expected Discharge Plan:  [place]  In-House Referral:     Discharge planning Services  CM Consult  Post Acute Care Choice:    Choice offered to:     DME Arranged:    DME Agency:     HH Arranged:    [place] Agency:     Status of Service:  Completed, signed off  Medicare Important Message Given:  Yes Date Medicare IM Given:  12/27/14 Medicare IM give by:  Babette Relic RN Date Additional Medicare IM Given:  01/01/15 Additional Medicare Important Message give by:  Jasmine Pang RN MPH, case manager  If discussed at [place] of Stay Meetings, dates discussed:    Additional Comments:  Adron Bene, RN 01/01/2015, 12:16 PM

## 2015-01-01 NOTE — Progress Notes (Signed)
CRITICAL VALUE ALERT  Critical value received:  Hemoglobin 6.9  Date of notification:  01/01/15  Time of notification:  1700  Critical value read back:yes  Nurse who received alert:  Rayetta Humphrey RN  MD notified (1st page):  Marlowe Shores [facility]  Time of first page:  1705  MD notified (2nd page):  Time of second page:  Responding MD:   Time MD responded:

## 2015-01-01 NOTE — Progress Notes (Signed)
Received pt. As transfer from [address].Pt. Was oriented to the unit routine,safety plan was explained,fall prevention plan was explained and signed by the pt. And the RN.Welcome video was played.Pt. Has a surgical incision on rt. Stup with staples,ABD pad was applied as per MD order and stoking sock is on.Pt. Has a stage II on her buttocks with pink foam on.Keep monitoring pt. Closely and assessing her needs.

## 2015-01-01 NOTE — Progress Notes (Signed)
ANTICOAGULATION CONSULT NOTE - Follow Up Consult  Pharmacy Consult for lovenox Indication: DVT and hx of afib  No Known Allergies  Patient Measurements:   Heparin Dosing Weight:   Vital Signs: Temp: 97.8 F (36.6 C) (05/16 1414) Temp Source: Oral (05/16 1414) BP: 135/55 mmHg (05/16 1414) Pulse Rate: 88 (05/16 1414)  Labs:  Recent Labs  12/30/14 0614 12/31/14 0626 01/01/15 0430  HGB 9.4* 9.0* 9.9*  HCT 30.3* 28.7* 30.5*  PLT 406* 406* 309  CREATININE 1.08*  --   --     Estimated Creatinine Clearance: 56.9 mL/min (by C-G formula based on Cr of 1.08).   Medications:  Scheduled:  . [START ON 01/02/2015] amLODipine  5 mg Oral Daily  . [START ON 01/02/2015] docusate sodium  100 mg Oral Daily  . [START ON 01/02/2015] enoxaparin (LOVENOX) injection  130 mg Subcutaneous Q24H  . [START ON 01/02/2015] feeding supplement (ENSURE ENLIVE)  237 mL Oral BID BM  . [START ON 01/02/2015] FLUoxetine  20 mg Oral Daily  . gabapentin  100 mg Oral TID  . insulin aspart  0-9 Units Subcutaneous 6 times per day  . [START ON 01/02/2015] pantoprazole  40 mg Oral Daily   Infusions:    Assessment: 74 yo female with recent DVT and hx of afib will be continued on lovenox treatment.  Hgb 9.9 and Plt 309 K on 01/01/15  Goal of Therapy:  Anti-Xa level 0.6-1 units/ml 4hrs after LMWH dose given Monitor platelets by anticoagulation protocol: Yes   Plan:  - continue lovenox 130 mg sq q24h (1.5 mg/kg/day), next dose tomorrow @ 1130 - CBC at least every 72 hours  Kye Silverstein, Tsz-Yin 01/01/2015,2:34 PM

## 2015-01-01 NOTE — Progress Notes (Signed)
Received a critical lab call of Hemoglobin 6.9.Marlowe Shores PAC was page,orders were received for stat CBC.Keep monitoring pt. Closely and assessing her needs.

## 2015-01-02 ENCOUNTER — Inpatient Hospital Stay (HOSPITAL_COMMUNITY): Payer: Medicare Other | Admitting: Physical Therapy

## 2015-01-02 ENCOUNTER — Inpatient Hospital Stay (HOSPITAL_COMMUNITY): Payer: Medicare Other | Admitting: Occupational Therapy

## 2015-01-02 ENCOUNTER — Inpatient Hospital Stay (HOSPITAL_COMMUNITY): Payer: Medicare Other

## 2015-01-02 DIAGNOSIS — C189 Malignant neoplasm of colon, unspecified: Secondary | ICD-10-CM

## 2015-01-02 DIAGNOSIS — Z9889 Other specified postprocedural states: Secondary | ICD-10-CM

## 2015-01-02 DIAGNOSIS — Z89511 Acquired absence of right leg below knee: Secondary | ICD-10-CM

## 2015-01-02 DIAGNOSIS — R5381 Other malaise: Secondary | ICD-10-CM

## 2015-01-02 DIAGNOSIS — E119 Type 2 diabetes mellitus without complications: Secondary | ICD-10-CM

## 2015-01-02 LAB — GLUCOSE, CAPILLARY
GLUCOSE-CAPILLARY: 92 mg/dL (ref 65–99)
GLUCOSE-CAPILLARY: 121 mg/dL — AB (ref 65–99)
GLUCOSE-CAPILLARY: 132 mg/dL — AB (ref 65–99)
GLUCOSE-CAPILLARY: 114 mg/dL — AB (ref 65–99)
Glucose-Capillary: 99 mg/dL (ref 65–99)
Glucose-Capillary: 118 mg/dL — ABNORMAL HIGH (ref 65–99)

## 2015-01-02 LAB — COMPREHENSIVE METABOLIC PANEL
ALT: 13 U/L — ABNORMAL LOW (ref 14–54)
AST: 20 U/L (ref 15–41)
Albumin: <1 g/dL — ABNORMAL LOW (ref 3.5–5.0)
Alkaline Phosphatase: 112 U/L (ref 38–126)
Anion gap: 7 (ref 5–15)
BUN: 8 mg/dL (ref 6–20)
CALCIUM: 7.4 mg/dL — AB (ref 8.9–10.3)
CO2: 28 mmol/L (ref 22–32)
CREATININE: 0.88 mg/dL (ref 0.44–1.00)
Chloride: 103 mmol/L (ref 101–111)
GFR calc non Af Amer: >60 mL/min (ref >60)
GLUCOSE: 98 mg/dL (ref 65–99)
Potassium: 3.4 mmol/L — ABNORMAL LOW (ref 3.5–5.1)
SODIUM: 138 mmol/L (ref 135–145)
TOTAL PROTEIN: 4.7 g/dL — AB (ref 6.5–8.1)
Total Bilirubin: 0.3 mg/dL (ref 0.3–1.2)

## 2015-01-02 LAB — CBC WITH DIFFERENTIAL/PLATELET
Basophils Absolute: 0 10*3/uL (ref 0.0–0.1)
Basophils Relative: 0 % (ref 0–1)
Eosinophils Absolute: 0.1 10*3/uL (ref 0.0–0.7)
Eosinophils Relative: 0 % (ref 0–5)
HCT: 26.6 % — ABNORMAL LOW (ref 36.0–46.0)
Hemoglobin: 8.5 g/dL — ABNORMAL LOW (ref 12.0–15.0)
LYMPHS ABS: 0.9 10*3/uL (ref 0.7–4.0)
Lymphocytes Relative: 8 % — ABNORMAL LOW (ref 12–46)
MCH: 27.3 pg (ref 26.0–34.0)
MCHC: 32 g/dL (ref 30.0–36.0)
MCV: 85.5 fL (ref 78.0–100.0)
MONOS PCT: 8 % (ref 3–12)
Monocytes Absolute: 0.9 10*3/uL (ref 0.1–1.0)
NEUTROS PCT: 84 % — AB (ref 43–77)
Neutro Abs: 9.5 10*3/uL — ABNORMAL HIGH (ref 1.7–7.7)
Platelets: 326 10*3/uL (ref 150–400)
RBC: 3.11 MIL/uL — AB (ref 3.87–5.11)
RDW: 18 % — ABNORMAL HIGH (ref 11.5–15.5)
WBC: 11.4 10*3/uL — ABNORMAL HIGH (ref 4.0–10.5)

## 2015-01-02 NOTE — Evaluation (Signed)
Physical Therapy Assessment and Plan  Patient Details  Name: Kathryn Knapp MRN: [id number] Date of Birth: 1941/03/13  PT Diagnosis: Abnormal posture, Abnormality of gait, Difficulty walking, Edema, Impaired sensation, Muscle weakness and Pain in abdomen and right residula limb.  Rehab Potential: Good ELOS: 17-21 days    Today's Date: 01/02/2015 PT Individual Time: 1000-1117 PT Individual Time Calculation (min): 77 min    Problem List:  Patient Active Problem List   Diagnosis Date Noted  . Amputation of right lower extremity below knee 01/01/2015  . S/P exploratory laparotomy   . S/P unilateral BKA (below knee amputation)   . Abdominal pain   . Pain in joint, lower leg   . Palliative care encounter   . Acute respiratory failure with hypoxia   . Thrombus   . Acute renal failure syndrome   . Peritonitis   . Colon cancer metastasized to multiple sites   . Acute blood loss anemia   . Melena   . Diabetes type 2, controlled   . Hypokalemia   . Hypomagnesemia   . Depression   . Noncompliance with medication regimen 12/23/2014  . S/P partial colectomy 12/17/2014  . Severe sepsis with acute organ dysfunction 12/17/2014  . Septic shock   . Bowel obstruction 12/16/2014  . Colonic mass 12/16/2014    Past Medical History:  Past Medical History  Diagnosis Date  . Diabetes mellitus without complication   . Hypertension    Past Surgical History:  Past Surgical History  Procedure Laterality Date  . Cholecystectomy    . Abdominal hysterectomy    . Laparotomy N/A 12/17/2014    Procedure: EXPLORATORY LAPAROTOMY Right, Transverse, partial left colectomy with en bloc partial gastrectomy illeostomy;  Surgeon: Georganna Skeans, MD;  Location: [facility];  Service: General;  Laterality: N/A;  . Amputation Right 12/28/2014    Procedure: AMPUTATION BELOW KNEE;  Surgeon: Conrad Mayer, MD;  Location: [facility];  Service: Vascular;  Laterality: Right;    Assessment & Plan Clinical Impression: Kathryn Knapp is a 74 y.o. right handed female with history of hypertension as well as diabetes mellitus. Independent prior to admission living with her daughter and grandchildren. Presented 12/16/2014 with nonspecific abdominal pain as well as nausea vomiting. He also had some recent weight loss. CT abdomen revealed a mass in the splenic flexure as well as nodule left adrenal gland. Abdominal pain progressed follow-up scan revealed some free fluid and air. Underwent exploratory laparotomy right transverse and partial left colectomy with en block partial gastrectomy, and ileostomy 12/17/2014 per Dr. Grandville Silos. Pathology report adenocarcinoma with metastases and await plan of care to be established as outpatient with oncology services Dr Burr Medico. Patient required ventilatory management followed by critical care postoperatively. Wound care nurse consulted for sacral decubitus with dressing change recommendations. She was extubated 12/21/2014 with slow progress. Palliative care consulted 12/27/2014. 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 Bridgett Larsson. Acute blood loss anemia 7.2-9.6 and monitored.  Patient was admitted for a comprehensive rehabilitation program to CIR on 01/01/15. [facility]  Patient currently requires max with mobility secondary to muscle weakness and muscle joint tightness, decreased cardiorespiratoy endurance and decreased oxygen support and decreased standing balance, decreased postural control and decreased balance strategies.  Prior to hospitalization, patient was independent  with mobility and lived with her daughter and grandchildren  in a mobile home with 4 steps to enter. Patient states that her family has built her a ramp to enter her home. [name] has a tub shower combination and a has a  first floor set-up with no stairs once she is inside her home.   Patient will benefit from skilled PT intervention to maximize safe functional mobility, minimize fall risk and decrease caregiver burden for planned discharge home with 24 hour supervision and minimal assistance.  Anticipate patient will benefit from follow up [place] at discharge.  PT - End of Session Activity Tolerance: Tolerates 30+ min activity with multiple rests Endurance Deficit: Yes Endurance Deficit Description: Needs frequent rests. Patient becomes short of breath with activity. PT Assessment Rehab Potential (ACUTE/IP ONLY): Good PT Patient demonstrates impairments in the following area(s): Balance;Pain;Safety;Skin Integrity;Motor;Endurance;Edema PT Transfers Functional Problem(s): Bed Mobility;Bed to Chair;Car;Furniture PT Locomotion Functional Problem(s): Wheelchair Mobility;Ambulation;Other (comment) (ramp ) PT Plan PT Intensity: Minimum of 1-2 x/day ,45 to 90 minutes PT Frequency: 5 out of 7 days PT Duration Estimated Length of Stay: 17-21 days  PT Treatment/Interventions: Discharge planning;DME/adaptive equipment instruction;Functional mobility training;Pain management;Therapeutic Activities;UE/LE Strength taining/ROM;Wheelchair propulsion/positioning;Therapeutic Exercise;Skin care/wound management;Patient/family education;Neuromuscular re-education;Community reintegration;Balance/vestibular training PT Transfers Anticipated Outcome(s): min assist  PT Locomotion Anticipated Outcome(s): Wheelchair level supervision  PT Recommendation Recommendations for Other Services: Neuropsych consult Follow Up Recommendations: Home health PT;24 hour supervision/assistance Patient destination: Home Equipment Recommended: Wheelchair (measurements);Wheelchair cushion (measurements);To be determined;Sliding board Equipment Details: will continue to assess durable medical equipment needs as paitnet progresses   Skilled Therapeutic  Intervention   Residual limb inspection; staple appear intact mild redness noted, incision is approximately 8.5 inches. Residual limb is bulbous in shape. Minimal serosanguinous drainage noted at end of session. Dressing and ACE wrap removed at end of session Charge RN Angie notified and aware. Patient was without discomfort at end of session.    Wheelchair modification: Patient provided an 18x18 manual wheelchair with right residual limb support an 18x18 Jay 2 deep contour cushion for increased pressure relief  To help achieve neutral pelvic positioning  and maximize immersion and envelopment of ischial tuberosities and sacrum within the fluid-based pressure relieving design of cushion. Will continue to re-assess appropriateness of seating and positioning system.   Patient propelled wheelchair with bilateral upper extremities 25 and 35 feet close supervision and minimal assist for turns.   Sit to and from stand transfer total assistx1. Patient stood for approximately 30 seconds with left lower extremity, right lower extremity non weight bearing total assist.   Quad sets 10x right lower extremity with a 5 second hold. Wheelchair push ups mod-max assist 5x.  Patient perfromed slide board transfer to and from mat max assistx1.  Patient perfromed slide board transfer to bed max assistx1.  PT Evaluation Precautions/Restrictions Precautions Precaution Comments: non weight bearing right lower extremity  Restrictions Weight Bearing Restrictions: Yes RLE Weight Bearing: Non weight bearing General Chart Reviewed: Yes Family/Caregiver Present: No Vital SignsTherapy Vitals Pulse Rate: 90 Oxygen Therapy SpO2: 97 % O2 Device: Nasal Cannula O2 Flow Rate (L/min): 2 L/min Pulse Oximetry Type: Continuous Pain Pain Assessment Pain Assessment: 0-10 Pain Score: 10-Worst pain ever Pain Type: Acute pain Pain Location: Abdomen Pain Descriptors / Indicators:  Aching;Discomfort;Shooting;Tightness;Tender Pain Onset: On-going Pain Intervention(s): Medication (See eMAR) Home Living/Prior Functioning Home Living Available Help at Discharge: Family Type of Home: Mobile home Home Access: Stairs to enter;Ramped entrance Entrance Stairs-Number of Steps: 4 Entrance Stairs-Rails: Can reach both Home Layout: One level  Lives With: Daughter Prior Function  Level of Independence: Independent with homemaking with ambulation  Able to Take Stairs?: Yes Leisure: Hobbies-yes (Comment) Comments: bowling  Cognition Overall Cognitive Status: Within Functional Limits for tasks assessed Arousal/Alertness: Awake/alert Orientation Level: Oriented X4 Attention: Focused;Sustained Focused Attention: Appears intact Sustained Attention: Appears intact Memory: Appears intact Awareness: Appears intact Problem Solving: Appears intact Reasoning: Appears intact Behaviors: Poor frustration tolerance Safety/Judgment: Appears intact Sensation Sensation Light Touch: Appears Intact Proprioception: Appears Intact (left lower extremity ) Coordination Gross Motor Movements are Fluid and Coordinated: Yes Motor  Motor Motor: Within Functional Limits  Mobility Transfers Transfers: Yes Sit to Stand: 2: Max assist Sit to Stand Details: Tactile cues for initiation;Tactile cues for weight shifting;Tactile cues for placement;Verbal cues for technique;Verbal cues for sequencing Stand to Sit: 1: +1 Total assist Stand to Sit Details (indicate cue type and reason): Tactile cues for placement;Tactile cues for initiation;Verbal cues for sequencing;Visual cues/gestures for precautions/safety;Verbal cues for technique Lateral/Scoot Transfers: 2: Max assist Lateral/Scoot Transfer Details: Tactile cues for initiation;Tactile cues for weight shifting;Tactile cues for placement;Verbal cues for technique;Verbal cues for sequencing;Manual facilitation for weight shifting Locomotion   Ambulation Ambulation: No Gait Gait: No Stairs / Additional Locomotion Stairs: No Wheelchair Mobility Wheelchair Mobility: Yes Wheelchair Assistance: 1: +1 Total assist Wheelchair Propulsion: Both upper extremities Wheelchair Parts Management: Needs assistance Distance: 25 feet   Trunk/Postural Assessment  Cervical Assessment Cervical Assessment: Within Functional Limits Thoracic Assessment Thoracic Assessment: Exceptions to [facility] Thoracic AROM Thoracic Flexion: Increased thoracic kyphosis  Lumbar Assessment Lumbar Assessment: Within Functional Limits Postural Control Postural Control: Within Functional Limits  Balance Balance Balance Assessed: Yes Static Sitting Balance Static Sitting - Balance Support: Feet supported;Bilateral upper extremity supported Static Sitting - Level of Assistance: 5: Stand by assistance Static Sitting - Comment/# of Minutes:  (5 minutes edge of mat ) Dynamic Sitting Balance Dynamic Sitting - Balance Support: Feet supported Dynamic Sitting - Level of Assistance: 3: Mod assist;4: Min assist Dynamic Sitting - Balance Activities: Lateral lean/weight shifting;Forward lean/weight shifting Static Standing Balance Static Standing - Level of Assistance: 1: +1 Total assist Static Standing - Comment/# of Minutes: 30 seconds Extremity Assessment      RLE Assessment RLE Assessment: Exceptions to [facility] RLE Strength RLE Overall Strength: Deficits RLE Overall Strength Comments: Patient presents with right transtibial amputation AAROM is within functional limits grossly 2-/5 strength throughout hip and knee.  LLE Assessment LLE Assessment: Exceptions to [facility] LLE Strength LLE Overall Strength: Deficits LLE Overall Strength Comments: Patient presents grossly with 2-/5 strength throughout left lower extremity; AAROM is withinfunctional limits at hip, knee and ankle.   FIM:  FIM - Locomotion: Wheelchair Distance: 25 feet    Refer to Care Plan for Long Term  Goals  Recommendations for other services: Neuropsych  Discharge Criteria: Patient will be discharged from PT if patient refuses treatment 3 consecutive times without medical reason, if treatment goals not met, if there is a change in medical status, if patient makes no progress towards goals or if patient is discharged from hospital.  The above assessment, treatment plan, treatment alternatives and goals were discussed and mutually agreed upon: by patient  [name] 01/02/2015, 12:31 PM

## 2015-01-02 NOTE — Progress Notes (Signed)
Occupational Therapy Session Note  Patient Details  Name: Kathryn Knapp MRN: [id number] Date of Birth: 1941-04-30  Today's Date: 01/02/2015 OT Individual Time: 1400-1500 OT Individual Time Calculation (min): 60 min    Short Term Goals: Week 1:  OT Short Term Goal 1 (Week 1): Pt will complete toilet transfer to drop arm BSC with max assist of one caregiver OT Short Term Goal 2 (Week 1): Pt will complete LB dressing with max assist of one caregiver OT Short Term Goal 3 (Week 1): Pt will complete bathing with min assist OT Short Term Goal 4 (Week 1): Pt will complete toileting with max assist of one caregiver  Skilled Therapeutic Interventions/Progress Updates:  Engaged in skilled treatment with focus on squat pivot and slide board transfers.  Completed bed mobility with mod assist with pt demonstrating carryover of sequencing with bed mobility, still requiring assist for supine to sit.  Attempted slide board transfer bed > drop arm BSC but slide board would not stay in place, therefore completed squat pivot with +2.  Max multimodal cues for anterior weight shift with therapist performing transfer with over the back technique to further promote forward weight shift.  Educated on lateral leans to complete clothing management with pt able to lean side to side while therapist pulled pants down, requiring one partial stand while 2nd person fully pulled pants down.  +2 for hygiene with 2nd person assisting with hygiene while pt and therapist completed partial stand with max assist.  Squat pivot back to bed with +2 with cues for forward weight shift.  Slide board transfer bed <> w/c with increased sequencing with "1, 2, 3, lift" completing transfer with max assist of 1.  Pt returned to bed at end of session and completed lateral scoots towards head of bed, then positioned semi-reclined in bed with all needs in reach.  Therapy Documentation Precautions:  Precautions Precautions: Fall Precaution Comments:  non weight bearing right lower extremity  Restrictions Weight Bearing Restrictions: Yes RLE Weight Bearing: Non weight bearing General:   Vital Signs: Therapy Vitals Temp: 98.1 F (36.7 C) Temp Source: Oral Pulse Rate: 86 Resp: 18 BP: (!) 129/46 mmHg (reported to nurse) Patient Position (if appropriate): Lying Oxygen Therapy SpO2: 100 % O2 Device: Not Delivered Pain: Pain Assessment Pain Assessment: 0-10 Pain Score: 9  Pain Type: Acute pain Pain Location: Abdomen Pain Descriptors / Indicators: Aching;Discomfort;Shooting Pain Onset: On-going Pain Intervention(s): RN made aware  See FIM for current functional status  Therapy/Group: Individual Therapy  Simonne Come 01/02/2015, 3:39 PM

## 2015-01-02 NOTE — Progress Notes (Signed)
Social Work Assessment and Plan Social Work Assessment and Plan  Patient Details  Name: Kathryn Knapp MRN: [id number] Date of Birth: 1941-05-06  Today's Date: 01/02/2015  Problem List:  Patient Active Problem List   Diagnosis Date Noted  . Amputation of right lower extremity below knee 01/01/2015  . S/P exploratory laparotomy   . S/P unilateral BKA (below knee amputation)   . Abdominal pain   . Pain in joint, lower leg   . Palliative care encounter   . Acute respiratory failure with hypoxia   . Thrombus   . Acute renal failure syndrome   . Peritonitis   . Colon cancer metastasized to multiple sites   . Acute blood loss anemia   . Melena   . Diabetes type 2, controlled   . Hypokalemia   . Hypomagnesemia   . Depression   . Noncompliance with medication regimen 12/23/2014  . S/P partial colectomy 12/17/2014  . Severe sepsis with acute organ dysfunction 12/17/2014  . Septic shock   . Bowel obstruction 12/16/2014  . Colonic mass 12/16/2014   Past Medical History:  Past Medical History  Diagnosis Date  . Diabetes mellitus without complication   . Hypertension    Past Surgical History:  Past Surgical History  Procedure Laterality Date  . Cholecystectomy    . Abdominal hysterectomy    . Laparotomy N/A 12/17/2014    Procedure: EXPLORATORY LAPAROTOMY Right, Transverse, partial left colectomy with en bloc partial gastrectomy illeostomy;  Surgeon: Georganna Skeans, MD;  Location: [place] [facility];  Service: General;  Laterality: N/A;  . Amputation Right 12/28/2014    Procedure: AMPUTATION BELOW KNEE;  Surgeon: Conrad New Salisbury, MD;  Location: [facility];  Service: Vascular;  Laterality: Right;   Social History:  reports that she has never smoked. She does not have any smokeless tobacco history on file. She reports that she does not drink alcohol or use illicit drugs.  Family / Support Systems Marital Status: Divorced Patient Roles: Parent Children: Rhonda Chatman-daughter [phone]  Other  Supports: [name] Lamb-daughter [phone]  [phone] Anticipated Caregiver: [name] is not employed right now and can be pt's caregiver Ability/Limitations of Caregiver: no limitations Caregiver Availability: 24/7 Family Dynamics: Close knit family-two daughter's are clsoe by-Rhonda lives with her along with her 67, 57,11 & 58 yo children.  All get along and depend upon each other.  Pt has been the [employer] of the family since her husband died when the children were little.  Pt is grateful to have them.  Social History Preferred language: English Religion: Non-Denominational Cultural Background: No issues Education: High School Read: Yes Write: Yes Employment Status: Retired Freight forwarder Issues: No issues Guardian/Conservator: None-according to MD pt is capable of making her own decisions while here   Abuse/Neglect Physical Abuse: Denies Verbal Abuse: Denies Sexual Abuse: Denies Exploitation of patient/patient's resources: Denies Self-Neglect: Denies  Emotional Status Pt's affect, behavior adn adjustment status: Pt has always been one to taek care of others and had her grandchildren with her since they were born.  She has always been independent and helped others.  She went through a difficult time when her son passed away 15 years ago, but has made peace with it. Recent Psychosocial Issues: Thought she was in fairly good health until all of this-had lost 50 lbs and was walking daily Pyschiatric History: history fo depression after son died but never saw anyone or took medicine for it. Would benefit from neuro-psych to see while here due to prognosis poor, but  still wants to fight and be positive.  Open about talking about her concerns. Substance Abuse History: No issues  Patient / Family Perceptions, Expectations & Goals Pt/Family understanding of illness & functional limitations: Pt and daughter's seem to have a basic understanding of her condition and follow up  with oncology after discharge from here. Both want to be hopeful and focus on the positive.  Want to enjoy one another and do what they can for each other. Premorbid pt/family roles/activities: Mom, Geophysicist/field seismologist, retiree, friend, etc Anticipated changes in roles/activities/participation: resume Pt/family expectations/goals: Pt states: " I want to do as much as i can for myself, I will work hard here."  Daughter states: " We will do whatever is needed, she would for [place]."  [employer]: None Premorbid Home Care/DME Agencies: None Transportation available at discharge: [employer] referrals recommended: Neuropsychology, Support group (specify)  Discharge Planning Living Arrangements: Children Support Systems: Children, Friends/neighbors Type of Residence: Private residence Administrator, sports: Information systems manager, [employer] (specify county) (Application 0/30 @ 1;31 pm) Museum/gallery curator Resources: Radio broadcast assistant Screen Referred: Yes Living Expenses: Education officer, community Management: Patient Does the patient have any problems obtaining your medications?: Yes (Describe) (Wasn't seeing a MD prior to admission) Home Management: Pt and daughter Patient/Family Preliminary Plans: return to her home with [name] and her four children, [name] is not working at this time adn will be pt's caregiver at discharge.  Will have her come in and attend therapies with pt closer to discharge.  pt applying for Medicaid tomorrow to assist wiht co-pays and bills. Social Work Anticipated Follow Up Needs: HH/OP, Support Group  Clinical Impression Pleasant realistic patient who is willing to do her part to recover and have the best quality of life she can.  She is a devoted mother and grandmother and seems to be the backbone of her family. Will provide support to all and work on eligible resources.  Landlord issue was misunderstanding and now resolved.  Would benefit from neuro-psych seeing while here.  Await  team's evaluations and work on safe discharge plan.  [name] 01/02/2015, 3:29 PM

## 2015-01-02 NOTE — Progress Notes (Signed)
74 y.o. right handed female with history of hypertension as well as diabetes mellitus. Independent prior to admission living with her daughter and grandchildren. Presented 12/16/2014 with nonspecific abdominal pain as well as nausea vomiting. He also had some recent weight loss. CT abdomen revealed a mass in the splenic flexure as well as nodule left adrenal gland. Abdominal pain progressed follow-up scan revealed some free fluid and air. Underwent exploratory laparotomy right transverse and partial left colectomy with en block partial gastrectomy, and ileostomy 12/17/2014 per Dr. Grandville Silos. Pathology report adenocarcinoma with metastases and await plan of care to be established as outpatient with oncology services Dr Burr Medico. Patient required ventilatory management followed by critical care postoperatively. Wound care nurse consulted for sacral decubitus with dressing change recommendations. She was extubated 12/21/2014 with slow progress. Palliative care consulted 12/27/2014. 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 Bridgett Larsson. Acute blood loss anemia 7.2-9.6 and monitored.  Subjective/Complaints: No issues overnite except felt SOB, O2 replaced  No nausea or vomiting abd incisional pain   Objective: Vital Signs: Blood pressure 142/68, pulse 84, temperature 97.9 F (36.6 C), temperature source Oral, resp. rate 18, height _0  (1.803 m), SpO2 99 %. No results found. Results for orders placed or performed during the hospital encounter of 01/01/15 (from the past 72 hour(s))  CBC     Status: Abnormal   Collection Time: 01/01/15  3:37 PM  Result Value Ref Range   WBC 14.9 (H) 4.0 - 10.5 K/uL   RBC 2.57 (L) 3.87 - 5.11 MIL/uL   Hemoglobin 6.9 (LL) 12.0 - 15.0 g/dL    Comment:  REPEATED TO VERIFY CRITICAL RESULT CALLED TO, READ BACK BY AND VERIFIED WITH: L ROSERO,RN 1703 01/01/15 D BRADLEY    HCT 21.2 (L) 36.0 - 46.0 %   MCV 82.5 78.0 - 100.0 fL   MCH 26.8 26.0 - 34.0 pg   MCHC 32.5 30.0 - 36.0 g/dL   RDW 18.1 (H) 11.5 - 15.5 %   Platelets 458 (H) 150 - 400 K/uL  Creatinine, serum     Status: Abnormal   Collection Time: 01/01/15  3:37 PM  Result Value Ref Range   Creatinine, Ser 1.02 (H) 0.44 - 1.00 mg/dL   GFR calc non Af Amer 53 (L) >60 mL/min   GFR calc Af Amer >60 >60 mL/min    Comment: (NOTE) The eGFR has been calculated using the CKD EPI equation. This calculation has not been validated in all clinical situations. eGFR's persistently <60 mL/min signify possible Chronic Kidney Disease.   Glucose, capillary     Status: Abnormal   Collection Time: 01/01/15  5:19 PM  Result Value Ref Range   Glucose-Capillary 110 (H) 65 - 99 mg/dL   Comment 1 Notify RN   CBC     Status: Abnormal   Collection Time: 01/01/15  6:32 PM  Result Value Ref Range   WBC 12.4 (H) 4.0 - 10.5 K/uL   RBC 3.30 (L) 3.87 - 5.11 MIL/uL   Hemoglobin 8.8 (L) 12.0 - 15.0 g/dL    Comment: REPEATED TO VERIFY   HCT 27.8 (L) 36.0 - 46.0 %   MCV 84.2 78.0 - 100.0 fL   MCH 26.7 26.0 - 34.0 pg   MCHC 31.7 30.0 - 36.0 g/dL   RDW 18.1 (H) 11.5 - 15.5 %  Platelets 359 150 - 400 K/uL  Glucose, capillary     Status: Abnormal   Collection Time: 01/01/15  9:05 PM  Result Value Ref Range   Glucose-Capillary 111 (H) 65 - 99 mg/dL  Glucose, capillary     Status: None   Collection Time: 01/02/15  2:26 AM  Result Value Ref Range   Glucose-Capillary 92 65 - 99 mg/dL  Glucose, capillary     Status: None   Collection Time: 01/02/15  4:45 AM  Result Value Ref Range   Glucose-Capillary 99 65 - 99 mg/dL  CBC WITH DIFFERENTIAL     Status: Abnormal   Collection Time: 01/02/15  5:39 AM  Result Value Ref Range   WBC 11.4 (H) 4.0 - 10.5 K/uL   RBC 3.11 (L) 3.87 - 5.11 MIL/uL   Hemoglobin 8.5 (L)  12.0 - 15.0 g/dL   HCT 26.6 (L) 36.0 - 46.0 %   MCV 85.5 78.0 - 100.0 fL   MCH 27.3 26.0 - 34.0 pg   MCHC 32.0 30.0 - 36.0 g/dL   RDW 18.0 (H) 11.5 - 15.5 %   Platelets 326 150 - 400 K/uL   Neutrophils Relative % 84 (H) 43 - 77 %   Neutro Abs 9.5 (H) 1.7 - 7.7 K/uL   Lymphocytes Relative 8 (L) 12 - 46 %   Lymphs Abs 0.9 0.7 - 4.0 K/uL   Monocytes Relative 8 3 - 12 %   Monocytes Absolute 0.9 0.1 - 1.0 K/uL   Eosinophils Relative 0 0 - 5 %   Eosinophils Absolute 0.1 0.0 - 0.7 K/uL   Basophils Relative 0 0 - 1 %   Basophils Absolute 0.0 0.0 - 0.1 K/uL  Comprehensive metabolic panel     Status: Abnormal   Collection Time: 01/02/15  5:39 AM  Result Value Ref Range   Sodium 138 135 - 145 mmol/L   Potassium 3.4 (L) 3.5 - 5.1 mmol/L   Chloride 103 101 - 111 mmol/L   CO2 28 22 - 32 mmol/L   Glucose, Bld 98 65 - 99 mg/dL   BUN 8 6 - 20 mg/dL   Creatinine, Ser 0.88 0.44 - 1.00 mg/dL   Calcium 7.4 (L) 8.9 - 10.3 mg/dL   Total Protein 4.7 (L) 6.5 - 8.1 g/dL   Albumin <1.0 (L) 3.5 - 5.0 g/dL   AST 20 15 - 41 U/L   ALT 13 (L) 14 - 54 U/L   Alkaline Phosphatase 112 38 - 126 U/L   Total Bilirubin 0.3 0.3 - 1.2 mg/dL   GFR calc non Af Amer >60 >60 mL/min   GFR calc Af Amer >60 >60 mL/min    Comment: (NOTE) The eGFR has been calculated using the CKD EPI equation. This calculation has not been validated in all clinical situations. eGFR's persistently <60 mL/min signify possible Chronic Kidney Disease.    Anion gap 7 5 - 15     HEENT: poor dentition Cardio: RRR and tachy Resp: CTA B/L and unlabored GI: BS positive and midline incision RLQ colostomy Extremity:  Edema Right stump Skin:   Wound C/D/I Neuro: Alert/Oriented, Anxious and Abnormal Motor I/5 bilateral deltoids, biceps, triceps, grip, 4/5 right hip flexor, 4/5 left hip flexor knee extensor ankle dorsiflexor Musc/Skel:  Other rright BKA Gen. no acute distress   Assessment/Plan: 1. Functional deficits secondary to right BKA  in a patient deconditioned with metastatic adenocarcinoma of the colon which require 3+ hours per day of interdisciplinary therapy in a comprehensive inpatient  rehab setting. Physiatrist is providing close team supervision and 24 hour management of active medical problems listed below. Physiatrist and rehab team continue to assess barriers to discharge/monitor patient progress toward functional and medical goals. FIM:                   Comprehension Comprehension Mode: Auditory Comprehension: 5-Understands complex 90% of the time/Cues < 10% of the time  Expression Expression Mode: Verbal Expression: 5-Expresses complex 90% of the time/cues < 10% of the time  Social Interaction Social Interaction: 5-Interacts appropriately 90% of the time - Needs monitoring or encouragement for participation or interaction.  Problem Solving Problem Solving: 5-Solves basic problems: With no assist  Memory Memory: 6-More than reasonable amt of time  Medical Problem List and Plan: 1. 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  DVT Prophylaxis/Anticoagulation: Subcutaneous Lovenox. Monitor platelet counts and any signs of bleeding 3. Pain Management: Neurontin 100 mg 3 times a day, Oxycodone. Monitor with increased mobility 4. Mood/depression: Prozac 20 mg daily. 5. Neuropsych: This patient is capable of making decisions on her own behalf. 6. Skin/Wound Care: Local care to right BKA site as well as abdominal wound/ostomy and deep tissue injury to buttocks and sacrum.  WOC RN following for wound care. Has had some issues with seal of ostomy 7. Fluids/Electrolytes/Nutrition: Routine I nose with follow-up chemistries 8. Acute blood loss anemia. Follow-up CBC tonight 9. Diabetes mellitus with peripheral neuropathy. Hemoglobin A1c 6.3. Sliding scale insulin.  Check blood sugars before meals and at bedtime 10. Hypertension. Norvasc 5 mg daily. 11.  Hypoxia, recheck chest x-ray continue supplemental O2, afebrile, lung exam unremarkable LOS (Days) 1 A FACE TO FACE EVALUATION WAS PERFORMED  Kathryn Knapp E 01/02/2015, 8:34 AM

## 2015-01-02 NOTE — Progress Notes (Signed)
[facility] Admission Coordinator Signed Physical Medicine and Rehabilitation PMR Pre-admission 12/29/2014 1:40 PM  Related encounter: ED to Hosp-Admission (Discharged) from 12/16/2014 in [facility] Collapse All   PMR Admission Coordinator Pre-Admission Assessment  Patient: Kathryn Knapp is an 73 y.o., female MRN: [id number] DOB: 30-Sep-1940 Height: 5\' 11"  (180.3 cm) Weight: 88.1 kg (194 lb 3.6 oz)  Insurance Information  PRIMARY: Medicare Part A only Policy#: [id number] Subscriber: self Pre-Cert#: verified in [place]  Employer: not working [employer] Date: A: 09/18/06 Deduct: $1288 Out of Pocket [id number] Life Max: unlimited CIR: 100% SNF: 100% days 1-20; 80% days 21-100 (100 days max) Outpatient: no benefit Co-Pay:  Home Health: 100% Co-Pay: none, no visit limits DME: no benefit Co-Pay:  Providers: pt's preference  Note: pt has also started the [facility] application process.  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 right handed female with history of hypertension as well as diabetes mellitus. Independent prior to admission living with her daughter and grandchildren. Presented 12/16/2014 with nonspecific abdominal pain as well as nausea vomiting. He also had some recent weight loss. CT abdomen revealed a mass in the  splenic flexure as well as nodule left adrenal gland. Abdominal pain progressed follow-up scan revealed some free fluid and air. Underwent exploratory laparotomy right transverse and partial left colectomy with en block partial gastrectomy, and ileostomy 12/17/2014 per Dr. Grandville Silos. Pathology report adenocarcinoma with metastases and await plan of care. Patient required ventilatory management followed by critical care postoperatively. Wound care nurse consulted for sacral decubitus with dressing change recommendations. She was extubated 12/21/2014 with slow progress. Palliative care consulted 12/27/2014. 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 Bridgett Larsson. Acute blood loss anemia 7.2-9.6 and monitored. Physical and occupational therapy evaluations are pending. M.D. has requested physical medicine rehabilitation consult.   Past Medical History  Past Medical History  Diagnosis Date  . Diabetes mellitus without complication   . Hypertension     Family History  family history is not on file.  Prior Rehab/Hospitalizations: none  Current Medications   Current facility-administered medications:  . 0.9 % sodium chloride infusion, , Intravenous, Continuous, Wilford Corner, MD, Last Rate: 20 mL/hr at 12/29/14 1511, 20 mL/hr at 12/29/14 1511 . acetaminophen (TYLENOL) tablet 325-650 mg, 325-650 mg, Oral, Q4H PRN **OR** acetaminophen (TYLENOL) suppository 325-650 mg, 325-650 mg, Rectal, Q4H PRN, Ulyses Amor, PA-C . albuterol (PROVENTIL) (2.5 MG/3ML) 0.083% nebulizer solution 2.5 mg, 2.5 mg, Nebulization, Q4H PRN, Marijean Heath, NP . alum & mag hydroxide-simeth (MAALOX/MYLANTA) 200-200-20 MG/5ML suspension 15-30 mL, 15-30 mL, Oral, Q2H PRN, Ulyses Amor, PA-C . amLODipine (NORVASC) tablet 5 mg, 5 mg, Oral, Daily, Langley Gauss Moding,  MD, 5 mg at 12/31/14 1016 . antiseptic oral rinse (CPC / CETYLPYRIDINIUM CHLORIDE 0.05%) solution 7 mL, 7 mL, Mouth Rinse, q12n4p, Juanito Doom, MD, 7 mL at 12/31/14 1600 . chlorhexidine (PERIDEX) 0.12 % solution 15 mL, 15 mL, Mouth Rinse, BID, Juanito Doom,  MD, 15 mL at 12/31/14 2000 . docusate sodium (COLACE) capsule 100 mg, 100 mg, Oral, Daily, Ulyses Amor, PA-C, 100 mg at 12/31/14 1016 . enoxaparin (LOVENOX) injection 130 mg, 130 mg, Subcutaneous, Q24H, Cassie L Stewart, RPH, 130 mg at 12/31/14 1021 . feeding supplement (ENSURE ENLIVE) (ENSURE ENLIVE) liquid 237 mL, 237 mL, Oral, BID BM, Mellody Memos Lamber[place], RD, 237 mL at 12/31/14 1400 . fentaNYL (SUBLIMAZE) injection 12.5-25 mcg, 12.5-25 mcg, Intravenous, Q2H PRN, Juanito Doom, MD, 25 mcg at 01/01/15 [phone] . FLUoxetine (PROZAC) 20 MG/5ML solution 20 mg, 20 mg, Oral, Daily, Allie Bossier, MD, 20 mg at 12/30/14 1100 . gabapentin (NEURONTIN) capsule 100 mg, 100 mg, Oral, TID, Langley Gauss Moding, MD, 100 mg at 12/31/14 2118 . guaiFENesin-dextromethorphan (ROBITUSSIN DM) 100-10 MG/5ML syrup 15 mL, 15 mL, Oral, Q4H PRN, Ulyses Amor, PA-C . hydrALAZINE (APRESOLINE) injection 10 mg, 10 mg, Intravenous, Q4H PRN, Langley Gauss Moding, MD . insulin aspart (novoLOG) injection 0-9 Units, 0-9 Units, Subcutaneous, 6 times per day, Juanito Doom, MD, Stopped at 12/31/14 2000 . labetalol (NORMODYNE,TRANDATE) injection 10 mg, 10 mg, Intravenous, Q10 min PRN, Ulyses Amor, PA-C . magnesium sulfate IVPB 2 g 50 mL, 2 g, Intravenous, Daily PRN, Ulyses Amor, PA-C . metoprolol (LOPRESSOR) injection 2-5 mg, 2-5 mg, Intravenous, Q2H PRN, Ulyses Amor, PA-C . ondansetron [facility]) injection 4 mg, 4 mg, Intravenous, Q6H PRN, Ulyses Amor, PA-C . oxyCODONE-acetaminophen (PERCOCET/ROXICET) 5-325 MG per tablet 1-2 tablet, 1-2 tablet, Oral, Q4H PRN, Erby Pian, NP, 2 tablet at 12/31/14 1611 . pantoprazole (PROTONIX) EC tablet  40 mg, 40 mg, Oral, Daily, Ulyses Amor, PA-C, 40 mg at 12/31/14 1016 . phenol (CHLORASEPTIC) mouth spray 1 spray, 1 spray, Mouth/Throat, PRN, Ulyses Amor, PA-C . potassium chloride SA (K-DUR,KLOR-CON) CR tablet 20-40 mEq, 20-40 mEq, Oral, Daily PRN, Ulyses Amor, PA-C . sodium chloride 0.9 % injection 10-40 mL, 10-40 mL, Intracatheter, PRN, Charlesetta Shanks, MD  Patients Current Diet: Diet Carb Modified Fluid consistency:: Thin; Room service appropriate?: Yes  Precautions / Restrictions Precautions Precautions: Fall Restrictions Weight Bearing Restrictions: No   Prior Activity Level Community (5-7x/wk): Pt was active, got out everyday and walked back and forth to the bus stop to pick up her grandchildren.    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  RLE: Unable to fully assess due to pain RLE Sensation: decreased light touch LLE Deficits / Details: AAROM WFL; hip/knee extension 3+ to 4/5    ADLs  Overall ADL's : Needs assistance/impaired Eating/Feeding: Set up, Bed level Grooming: Wash/dry hands, Wash/dry face, Set up, Brushing hair, Maximal assistance, Sitting, Bed level Upper Body Bathing: Moderate assistance, Sitting Lower  Body Bathing: Total assistance, Bed level Upper Body Dressing : Minimal assistance, Sitting Lower Body Dressing: Total assistance, Bed level Toilet Transfer Details (indicate cue type and reason): assisted to roll to R side for bedpan Toileting- Clothing Manipulation and  Hygiene: Total assistance, Bed level    Mobility  Overal bed mobility: Needs Assistance Bed Mobility: Supine to Sit, Sit to Supine Rolling: Min assist Supine to sit: Mod assist, HOB elevated, +2 for safety/equipment Sit to supine: +2 for physical assistance, Mod assist, HOB elevated General bed mobility comments: assist to raise trunk and advance hips to EOB with pad, assist for L LE and guided shoulders back to supine, +2 max to pull up in bed with pt assisting by pulling up on headboard.    Transfers  Overall transfer level: Needs assistance Equipment used: (requires lift equipment) General transfer comment: nursing using lift equipment    Ambulation / Gait / Stairs / Wheelchair Mobility   not assessed yet, anticipate needs    Posture / Balance Dynamic Sitting Balance Sitting balance - Comments: tolerated EOB x 5 minutes. Pt worked on extending right LE to full extension while sitting EOB. Also worked on flexion. Needed min guard to min assist. Leans posterior at times. Used UE support heavily at times.  Balance Overall balance assessment: Needs assistance, History of Falls Sitting-balance support: Single extremity supported (foot supported) Sitting balance-Leahy Scale: Poor Sitting balance - Comments: tolerated EOB x 5 minutes. Pt worked on extending right LE to full extension while sitting EOB. Also worked on flexion. Needed min guard to min assist. Leans posterior at times. Used UE support heavily at times.     Special needs/care consideration BiPAP/CPAP no CPM no  Continuous Drip IV no  Dialysis no  Life Vest no  Oxygen - currently on 2.5 L O2 by nasal cannula   Special Bed no  Trach Size no  Wound Vac (area) no  Skin - current R BKA incision and L colostomy  Bowel mgmt: last BM on 12-29-14 Bladder mgmt: using bedpan Diabetic mgmt - pt reports that she stopped taking her DM medications in 2003/11/19 after her son died. "It came back with a vengeance."    Previous Home Environment Living Arrangements: Children, Other relatives (3 young grandchildren) Available Help at Discharge: Family, Available 24 hours/day Type of Home: Mobile home Home Layout: One level Home Access: Stairs to enter Entrance Stairs-Rails: Right, Left, Can reach both Entrance Stairs-Number of Steps: 4 Bathroom Shower/Tub: Chiropodist: Standard Home Care Services: No Additional Comments: pt is at risk for losing her home, case management is involved (acute care social worker's note reports that pt stated her landlord is allowing her and her family to stay due to her current medical issues)  Discharge Living Setting Plans for Discharge Living Setting: Patient's home, Mobile Home Type of Home at Discharge: Mobile home Discharge Home Layout: One level Discharge Home Access: Stairs to enter Entrance Stairs-Rails: Right, Left, Can reach both Entrance Stairs-Number of Steps: 4 Discharge Bathroom Shower/Tub: Tub/shower unit Discharge Bathroom Toilet: Standard Does the patient have any problems obtaining your medications?: No  Social/Family/Support Systems Patient Roles: Parent (involved grandmother to 3 school aged children) Contact Information: see above, dtrs are primary contacts Anticipated Caregiver: pt lives with dtr named [name] and [name] is not currently working. Anticipated Caregiver's Contact Information: see above Ability/Limitations of Caregiver: no limitations Caregiver Availability: 24/7 Discharge Plan Discussed with Primary Caregiver: Yes Is Caregiver In Agreement with Plan?: Yes Does Caregiver/Family have  Issues with Lodging/Transportation while Pt is in Rehab?: No  Goals/Additional Needs Patient/Family Goal for Rehab: Supervision and Min assist with PT/OT; NA for SLP Expected length of stay: 15-23 days Cultural Considerations: none Dietary Needs: carb modified Equipment Needs: to  be determined Pt/Family Agrees to Admission and willing to participate: Yes (spoke with pt and dtr/grand-dtr on 5-13) Program Orientation Provided & Reviewed with Pt/Caregiver Including Roles & Responsibilities: Yes   Decrease burden of Care through IP rehab admission: NA  Possible need for SNF placement upon discharge: not anticipated  Patient Condition: This patient's medical and functional status has changed since the consult dated: 12-29-14 in which the Rehabilitation Physician determined and documented that the patient's condition is appropriate for intensive rehabilitative care in an inpatient rehabilitation facility. See "History of Present Illness" (above) for medical update. Functional changes are: moderate assistance of 2 for limited transfers and moderate to total assistance with self care skills. Patient's medical and functional status update has been discussed with the Rehabilitation physician and patient remains appropriate for inpatient rehabilitation. Will admit to inpatient rehab today.  Preadmission Screen Completed By: Nanetta Batty, PT, 01/01/2015 9:50 AM ______________________________________________________________________  Discussed status with Dr. Naaman Plummer on 01-01-15 at [phone] and received telephone approval for admission today.  Admission Coordinator: Nanetta Batty, PT, time0949/Date5-16-16          Cosigned by: Meredith Staggers, MD at 01/01/2015 12:00 PM  Revision History     Date/Time User Provider Type Action   01/01/2015 12:00 PM Meredith Staggers, MD Physician Cosign   01/01/2015 10:32 AM [facility] Admission Coordinator Sign   01/01/2015 10:31 AM [facility] Admission Coordinator Sign   01/01/2015 9:58 AM [facility] Admission Coordinator Sign   01/01/2015 9:54 AM [facility] Admission Coordinator Incomplete Revision   01/01/2015 9:50 AM [facility] Admission Coordinator Sign   View Details Report

## 2015-01-02 NOTE — Evaluation (Signed)
Occupational Therapy Assessment and Plan  Patient Details  Name: Kathryn Knapp MRN: [id number] Date of Birth: 1940/09/28  OT Diagnosis: abnormal posture, acute pain, muscle weakness (generalized) and edema Rehab Potential: Rehab Potential (ACUTE ONLY): Good ELOS: 17-20 days   Today's Date: 01/02/2015 OT Individual Time: 0900-1000  OT Individual Time Calculation (min): 60 min  Problem List:  Patient Active Problem List   Diagnosis Date Noted  . Amputation of right lower extremity below knee 01/01/2015  . S/P exploratory laparotomy   . S/P unilateral BKA (below knee amputation)   . Abdominal pain   . Pain in joint, lower leg   . Palliative care encounter   . Acute respiratory failure with hypoxia   . Thrombus   . Acute renal failure syndrome   . Peritonitis   . Colon cancer metastasized to multiple sites   . Acute blood loss anemia   . Melena   . Diabetes type 2, controlled   . Hypokalemia   . Hypomagnesemia   . Depression   . Noncompliance with medication regimen 12/23/2014  . S/P partial colectomy 12/17/2014  . Severe sepsis with acute organ dysfunction 12/17/2014  . Septic shock   . Bowel obstruction 12/16/2014  . Colonic mass 12/16/2014    Past Medical History:  Past Medical History  Diagnosis Date  . Diabetes mellitus without complication   . Hypertension    Past Surgical History:  Past Surgical History  Procedure Laterality Date  . Cholecystectomy    . Abdominal hysterectomy    . Laparotomy N/A 12/17/2014    Procedure: EXPLORATORY LAPAROTOMY Right, Transverse, partial left colectomy with en bloc partial gastrectomy illeostomy;  Surgeon: Georganna Skeans, MD;  Location: [place];  Service: General;  Laterality: N/A;  . Amputation Right 12/28/2014    Procedure: AMPUTATION BELOW KNEE;  Surgeon: Conrad Navassa, MD;  Location: [facility] OR;  Service: Vascular;  Laterality: Right;    Assessment & Plan Clinical Impression: Patient is a 74 y.o. right handed female with history of  hypertension as well as diabetes mellitus. Independent prior to admission living with her daughter and grandchildren. Presented 12/16/2014 with nonspecific abdominal pain as well as nausea vomiting. He also had some recent weight loss. CT abdomen revealed a mass in the splenic flexure as well as nodule left adrenal gland. Abdominal pain progressed follow-up scan revealed some free fluid and air. Underwent exploratory laparotomy right transverse and partial left colectomy with en block partial gastrectomy, and ileostomy 12/17/2014 per Dr. Grandville Silos. Pathology report adenocarcinoma with metastases and await plan of care to be established as outpatient with oncology services Dr Burr Medico. Patient required ventilatory management followed by critical care postoperatively. Wound care nurse consulted for sacral decubitus with dressing change recommendations. She was extubated 12/21/2014 with slow progress. Palliative care consulted 12/27/2014. 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 Bridgett Larsson. Acute blood loss anemia 7.2-9.6 and monitored. Physical and occupational therapy evaluations completed with recommendations of physical medicine rehabilitation consult.   Patient transferred to CIR on 01/01/2015 .    Patient currently requires total with basic self-care skills secondary to muscle weakness and muscle joint tightness, decreased cardiorespiratoy endurance and decreased oxygen support and decreased sitting balance, decreased standing balance, decreased postural control and decreased balance strategies.  Prior to hospitalization, patient could complete ADLs with independent .  Patient will  benefit from skilled intervention to decrease level of assist with basic self-care skills prior to discharge home  with care partner.  Anticipate patient will require 24 hour supervision and minimal physical assistance and follow up home health.  OT - End of Session Activity Tolerance: Tolerates 30+ min activity with multiple rests Endurance Deficit: Yes Endurance Deficit Description: Needs frequent rests. Patient becomes short of breath with activity. OT Assessment Rehab Potential (ACUTE ONLY): Good OT Patient demonstrates impairments in the following area(s): Balance;Endurance;Motor;Pain;Safety OT Basic ADL's Functional Problem(s): Grooming;Bathing;Dressing;Toileting OT Transfers Functional Problem(s): Toilet OT Additional Impairment(s): None OT Plan OT Intensity: Minimum of 1-2 x/day, 45 to 90 minutes OT Frequency: 5 out of 7 days OT Duration/Estimated Length of Stay: 17-20 days OT Treatment/Interventions: Balance/vestibular training;Discharge planning;Disease Lawyer;Functional mobility training;Pain management;Patient/family education;Psychosocial support;Self Care/advanced ADL retraining;Skin care/wound managment;Therapeutic Activities;Therapeutic Exercise;UE/LE Strength taining/ROM OT Self Feeding Anticipated Outcome(s): not a goal OT Basic Self-Care Anticipated Outcome(s): Supervision OT Toileting Anticipated Outcome(s): Min assist OT Bathroom Transfers Anticipated Outcome(s): Min assist OT Recommendation Recommendations for Other Services: Neuropsych consult Patient destination: Home Follow Up Recommendations: Home health OT;24 hour supervision/assistance Equipment Recommended: 3 in 1 bedside comode;Tub/shower bench   Skilled Therapeutic Intervention OT eval completed with discussion of rehab process, OT purpose, POC, goals, and ELOS.  Completed bed mobility and supine to sit with heavy use of bed rails and mod assist.  Pt required close supervision-min assist with sitting balance, reporting dizziness initially upon sitting but subsided <1  minute.  Pt tolerated sitting with intermittent min assist for approx 15 mins while engaging in bathing and dressing tasks.  Sit > stand with +2 (3 musketeer) to pull pants over hips.  Pt very fearful in standing, requiring rest breaks and reassurance throughout task.  Squat pivot +2 to Lt bed > w/c with pt demonstrating sequencing with lateral scoots in preparation for transfer, pt requires max multimodal cues for anterior weight shift to assist with transfer.  OT Evaluation Precautions/Restrictions  Precautions Precautions: Fall Precaution Comments: non weight bearing right lower extremity  Restrictions Weight Bearing Restrictions: Yes RLE Weight Bearing: Non weight bearing General   Vital Signs Therapy Vitals Temp: 98.1 F (36.7 C) Temp Source: Oral Pulse Rate: 86 Resp: 18 BP: (!) 129/46 mmHg (reported to nurse) Patient Position (if appropriate): Lying Oxygen Therapy SpO2: 100 % O2 Device: Not Delivered Pain Pain Assessment Pain Assessment: 0-10 Pain Score: 9  Pain Type: Acute pain Pain Location: Abdomen Pain Descriptors / Indicators: Aching;Discomfort;Shooting Pain Onset: On-going Pain Intervention(s): RN made aware Home Living/Prior Functioning Home Living Available Help at Discharge: Family Type of Home: Mobile home Home Access: Stairs to enter, Ramped entrance Technical brewer of Steps: 4 Entrance Stairs-Rails: Can reach both Home Layout: One level  Lives With: Daughter IADL History Homemaking Responsibilities: Yes Meal Prep Responsibility: Primary Laundry Responsibility: Primary Cleaning Responsibility: Primary Prior Function Level of Independence: Independent with homemaking with ambulation, Independent with basic ADLs  Able to Take Stairs?: Yes Leisure: Hobbies-yes (Comment) Comments: bowling ADL  See FIM Vision/Perception  Vision- History Baseline Vision/History: Wears glasses Wears Glasses: Reading only Patient Visual Report: No change from  baseline Vision- Assessment Vision Assessment?: No apparent visual deficits  Cognition Overall Cognitive Status: Within Functional Limits for tasks assessed Arousal/Alertness: Awake/alert Orientation Level: Oriented X4 Attention: Focused;Sustained Focused Attention: Appears intact Sustained Attention: Appears intact Memory: Appears intact Awareness: Appears intact Problem Solving: Appears intact Reasoning: Appears intact Behaviors: Poor frustration tolerance Safety/Judgment: Appears intact Sensation Sensation Light Touch: Appears Intact Proprioception:  Appears Intact (left lower extremity ) Coordination Gross Motor Movements are Fluid and Coordinated: Yes Fine Motor Movements are Fluid and Coordinated: No (contracture digits 2-4) Motor  Motor Motor: Within Functional Limits Mobility  Transfers Sit to Stand: 2: Max assist Sit to Stand Details: Tactile cues for initiation;Tactile cues for weight shifting;Tactile cues for placement;Verbal cues for technique;Verbal cues for sequencing Stand to Sit: 1: +1 Total assist Stand to Sit Details (indicate cue type and reason): Tactile cues for placement;Tactile cues for initiation;Verbal cues for sequencing;Visual cues/gestures for precautions/safety;Verbal cues for technique  Trunk/Postural Assessment  Cervical Assessment Cervical Assessment: Within Functional Limits Thoracic Assessment Thoracic Assessment: Exceptions to [facility] Thoracic AROM Thoracic Flexion: Increased thoracic kyphosis  Lumbar Assessment Lumbar Assessment: Within Functional Limits Postural Control Postural Control: Within Functional Limits  Balance Balance Balance Assessed: Yes Static Sitting Balance Static Sitting - Balance Support: Feet supported;Bilateral upper extremity supported Static Sitting - Level of Assistance: 5: Stand by assistance Static Sitting - Comment/# of Minutes:  (5 minutes edge of mat ) Dynamic Sitting Balance Dynamic Sitting - Balance  Support: Feet supported Dynamic Sitting - Level of Assistance: 3: Mod assist;4: Min assist Dynamic Sitting - Balance Activities: Lateral lean/weight shifting;Forward lean/weight shifting Static Standing Balance Static Standing - Level of Assistance: 1: +1 Total assist Static Standing - Comment/# of Minutes: 30 seconds Extremity/Trunk Assessment RUE Assessment RUE Assessment: Exceptions to WFL (ROM WFL, strength grossly 3+/5 at shoulder, 4 distal) LUE Assessment LUE Assessment: Exceptions to WFL (ROM WFL, strength grossly 3+/5 at shoulder, 4 distal)  FIM:  FIM - Grooming Grooming Steps: Wash, rinse, dry face;Wash, rinse, dry hands Grooming: 3: Patient completes 2 of 4 or 3 of 5 steps FIM - Bathing Bathing Steps Patient Completed: Chest;Right Arm;Left Arm;Abdomen;Right upper leg;Left upper leg Bathing: 3: Mod-Patient completes 5-7 23f 10 parts or 50-74% FIM - Upper Body Dressing/Undressing Upper body dressing/undressing steps patient completed: Thread/unthread right sleeve of pullover shirt/dresss;Thread/unthread left sleeve of pullover shirt/dress;Put head through opening of pull over shirt/dress;Pull shirt over trunk Upper body dressing/undressing: 5: Set-up assist to: Obtain clothing/put away FIM - Lower Body Dressing/Undressing Lower body dressing/undressing: 1: Two helpers FIM - Control and instrumentation engineer Devices: Bed rails Bed/Chair Transfer: 3: Supine > Sit: Mod A (lifting assist/Pt. 50-74%/lift 2 legs;1: Chair or W/C > Bed: Total A (helper does all/Pt. < 25%);1: Two helpers   Refer to Care Plan for Long Term Goals  Recommendations for other services: Neuropsych  Discharge Criteria: Patient will be discharged from OT if patient refuses treatment 3 consecutive times without medical reason, if treatment goals not met, if there is a change in medical status, if patient makes no progress towards goals or if patient is discharged from hospital.  The above  assessment, treatment plan, treatment alternatives and goals were discussed and mutually agreed upon: by patient  Simonne Come 01/02/2015, 3:24 PM

## 2015-01-02 NOTE — Progress Notes (Signed)
Patient information reviewed and entered into eRehab system by Omeed Osuna, RN, CRRN, PPS Coordinator.  Information including medical coding and functional independence measure will be reviewed and updated through discharge.     Per nursing patient was given "Data Collection Information Summary for Patients in Inpatient Rehabilitation Facilities with attached "Privacy Act Statement-Health Care Records" upon admission.  

## 2015-01-02 NOTE — Progress Notes (Signed)
Meredith Staggers, MD Physician Signed Physical Medicine and Rehabilitation Consult Note 12/28/2014 3:19 PM  Related encounter: ED to Hosp-Admission (Discharged) from 12/16/2014 in [place] Collapse All        Physical Medicine and Rehabilitation Consult Reason for Consult: Right BKA/bowel obstruction colonic mass Referring Physician: Internal medicine   HPI: Kathryn Knapp is a 74 y.o. right handed female with history of hypertension as well as diabetes mellitus. Independent prior to admission living with her daughter and grandchildren. Presented 12/16/2014 with nonspecific abdominal pain as well as nausea vomiting. He also had some recent weight loss. CT abdomen revealed a mass in the splenic flexure as well as nodule left adrenal gland. Abdominal pain progressed follow-up scan revealed some free fluid and air. Underwent exploratory laparotomy right transverse and partial left colectomy with en block partial gastrectomy, and ileostomy 12/17/2014 per Dr. Grandville Silos. Pathology report adenocarcinoma with metastases and await plan of care. Patient required ventilatory management followed by critical care postoperatively. Wound care nurse consulted for sacral decubitus with dressing change recommendations. She was extubated 12/21/2014 with slow progress. Palliative care consulted 12/27/2014. 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 Bridgett Larsson. Acute blood loss anemia 7.2-9.6 and monitored. Physical and occupational therapy evaluations are pending. M.D. has requested physical medicine rehabilitation consult   Review of Systems  Gastrointestinal: Positive for nausea, vomiting and abdominal pain.   Past Medical History  Diagnosis Date  . Diabetes mellitus without complication   . Hypertension    Past  Surgical History  Procedure Laterality Date  . Cholecystectomy    . Abdominal hysterectomy    . Laparotomy N/A 12/17/2014    Procedure: EXPLORATORY LAPAROTOMY Right, Transverse, partial left colectomy with en bloc partial gastrectomy illeostomy; Surgeon: Georganna Skeans, MD; Location: [place]; Service: General; Laterality: N/A;   History reviewed. No pertinent family history. Social History:  reports that she has never smoked. She does not have any smokeless tobacco history on file. She reports that she does not drink alcohol or use illicit drugs. Allergies: No Known Allergies Medications Prior to Admission  Medication Sig Dispense Refill  . ibuprofen (ADVIL,MOTRIN) 200 MG tablet Take 200 mg by mouth every 6 (six) hours as needed for mild pain or moderate pain.     Marland Kitchen HYDROcodone-acetaminophen (NORCO/VICODIN) 5-325 MG per tablet Take 1 tablet by mouth every 6 (six) hours as needed for moderate pain. (Patient not taking: Reported on 12/16/2014) 8 tablet 0  . ibuprofen (ADVIL,MOTRIN) 800 MG tablet Take 1 tablet (800 mg total) by mouth every 8 (eight) hours as needed. (Patient not taking: Reported on 12/16/2014) 21 tablet 0    Home: Home Living Family/patient expects to be discharged to:: Private residence Living Arrangements: Children, Other relatives (3 young grandchildren) Available Help at Discharge: Family, Available 24 hours/day Type of Home: Mobile home Home Access: Stairs to enter [employer] of Steps: 4 Entrance Stairs-Rails: Right, Left, Can reach both Home Layout: One level Additional Comments: pt is at risk for losing her home, case managment is involved  Functional History: Prior Function Level of Independence: Independent Functional Status:  Mobility: Bed Mobility Overal bed mobility: Needs Assistance Bed Mobility: Rolling Rolling: Min assist Supine to sit: Mod assist, HOB elevated General bed mobility comments: pt  "hooked arms" with PT to pull herself forward to long-sitting (from supported sitting in bed) Transfers Overall  transfer level: Needs assistance Equipment used: (requires lift equipment) General transfer comment: nursing using lift equipment      ADL: ADL Overall ADL's : Needs assistance/impaired Eating/Feeding: Set up, Bed level Grooming: Wash/dry hands, Wash/dry face, Set up, Brushing hair, Maximal assistance, Sitting, Bed level Upper Body Bathing: Moderate assistance, Bed level Lower Body Bathing: Total assistance, Bed level Upper Body Dressing : Minimal assistance, Bed level Lower Body Dressing: Total assistance, Bed level Toilet Transfer Details (indicate cue type and reason): assisted to roll to R side for bedpan Toileting- Clothing Manipulation and Hygiene: Total assistance, Bed level  Cognition: Cognition Overall Cognitive Status: Within Functional Limits for tasks assessed Orientation Level: Oriented X4 Cognition Arousal/Alertness: Awake/alert Behavior During Therapy: WFL for tasks assessed/performed Overall Cognitive Status: Within Functional Limits for tasks assessed  Blood pressure 158/66, pulse 83, temperature 97 F (36.1 C), temperature source Oral, resp. rate 29, height 5\' 11"  (1.803 m), weight 94.3 kg (207 lb 14.3 oz), SpO2 97 %. Physical Exam  Vitals reviewed. Constitutional: She is oriented to person, place, and time.  HENT:  Head: Normocephalic.  Eyes: EOM are normal.  Neck: Normal range of motion. Neck supple. No thyromegaly present.  Cardiovascular: Normal rate and regular rhythm.  Respiratory: Effort normal and breath sounds normal. No respiratory distress.  GI: Soft. Bowel sounds are normal. She exhibits no distension.  Neurological: She is alert and oriented to person, place, and time.  Alert and appropriate. Followed basic commands. Has reasonable insight and awareness. UE: 3+ deltoid, bicep, tricep and 4/5 wrist/hands. LLE: 2/5 HF, KE and 3+ to  4-/5 ankles. RLE: limited due to pain. Can move right leg at hip 1-2/5.  Skin:  Amputation site is dressed appropriately tender  Psychiatric: She has a normal mood and affect. Her behavior is normal. Thought content normal.     Lab Results Last 24 Hours    Results for orders placed or performed during the hospital encounter of 12/16/14 (from the past 24 hour(s))  Glucose, capillary Status: Abnormal   Collection Time: 12/27/14 4:20 PM  Result Value Ref Range   Glucose-Capillary 136 (H) 70 - 99 mg/dL  Glucose, capillary Status: Abnormal   Collection Time: 12/27/14 8:31 PM  Result Value Ref Range   Glucose-Capillary 132 (H) 70 - 99 mg/dL  Glucose, capillary Status: Abnormal   Collection Time: 12/28/14 12:21 AM  Result Value Ref Range   Glucose-Capillary 111 (H) 65 - 99 mg/dL  Glucose, capillary Status: Abnormal   Collection Time: 12/28/14 4:13 AM  Result Value Ref Range   Glucose-Capillary 110 (H) 65 - 99 mg/dL  CBC Status: Abnormal   Collection Time: 12/28/14 6:12 AM  Result Value Ref Range   WBC 21.2 (H) 4.0 - 10.5 K/uL   RBC 3.63 (L) 3.87 - 5.11 MIL/uL   Hemoglobin 9.5 (L) 12.0 - 15.0 g/dL   HCT 29.7 (L) 36.0 - 46.0 %   MCV 81.8 78.0 - 100.0 fL   MCH 26.2 26.0 - 34.0 pg   MCHC 32.0 30.0 - 36.0 g/dL   RDW 16.8 (H) 11.5 - 15.5 %   Platelets 391 150 - 400 K/uL  Protime-INR Status: Abnormal   Collection Time: 12/28/14 6:12 AM  Result Value Ref Range   Prothrombin Time 15.4 (H) 11.6 - 15.2 seconds   INR 1.21 0.00 - 7.82  Basic metabolic panel Status: Abnormal   Collection Time: 12/28/14 6:13 AM  Result Value Ref Range   Sodium 139 135 - 145 mmol/L   Potassium 3.3 (  L) 3.5 - 5.1 mmol/L   Chloride 106 101 - 111 mmol/L   CO2 27 22 - 32 mmol/L   Glucose, Bld 114 (H) 65 - 99 mg/dL   BUN 14 6 - 20 mg/dL   Creatinine,  Ser 1.06 (H) 0.44 - 1.00 mg/dL   Calcium 7.2 (L) 8.9 - 10.3 mg/dL   GFR calc non Af Amer 51 (L) >60 mL/min   GFR calc Af Amer 59 (L) >60 mL/min   Anion gap 6 5 - 15  Heparin level (unfractionated) Status: Abnormal   Collection Time: 12/28/14 6:13 AM  Result Value Ref Range   Heparin Unfractionated 0.10 (L) 0.30 - 0.70 IU/mL  Glucose, capillary Status: Abnormal   Collection Time: 12/28/14 7:29 AM  Result Value Ref Range   Glucose-Capillary 122 (H) 65 - 99 mg/dL  Glucose, capillary Status: Abnormal   Collection Time: 12/28/14 10:34 AM  Result Value Ref Range   Glucose-Capillary 110 (H) 65 - 99 mg/dL  Glucose, capillary Status: Abnormal   Collection Time: 12/28/14 2:08 PM  Result Value Ref Range   Glucose-Capillary 105 (H) 65 - 99 mg/dL   Comment 1 Notify RN    Comment 2 Document in Chart       Imaging Results (Last 48 hours)    No results found.    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 Does the need for close, 24 hr/day medical supervision in concert with the patient's rehab needs make it unreasonable for this patient to be served in a less intensive setting? Yes 2. Co-Morbidities requiring supervision/potential complications: sepsis, wound care, dm2, depression 3. Due to bladder management, bowel management, safety, skin/wound care, disease management, medication administration, pain management and patient education, does the patient require 24 hr/day rehab nursing? Yes 4. Does the patient require coordinated care of a physician, rehab nurse, PT (1-2 hrs/day, 5 days/week) and OT (1-2 hrs/day, 5 days/week) to address physical and functional deficits in the context of the above medical diagnosis(es)? Yes Addressing deficits in the following areas: balance, endurance, locomotion, strength, transferring, bowel/bladder control, bathing,  dressing, feeding, grooming, toileting and psychosocial support 5. Can the patient actively participate in an intensive therapy program of at least 3 hrs of therapy per day at least 5 days per week? Yes 6. The potential for patient to make measurable gains while on inpatient rehab is excellent 7. Anticipated functional outcomes upon discharge from inpatient rehab are supervision and min assist with PT, supervision and min assist with OT, n/a with SLP. 8. Estimated rehab length of stay to reach the above functional goals is: 15-23 days 9. Does the patient have adequate social supports and living environment to accommodate these discharge functional goals? Yes 10. Anticipated D/C setting: Home 11. Anticipated post D/C treatments: HH therapy and Outpatient therapy 12. Overall Rehab/Functional Prognosis: excellent  RECOMMENDATIONS: This patient's condition is appropriate for continued rehabilitative care in the following setting: CIR Patient has agreed to participate in recommended program. Yes Note that insurance prior authorization may be required for reimbursement for recommended care.  Comment: Rehab Admissions Coordinator to follow up.  Thanks,  Meredith Staggers, MD, [facility]     12/28/2014       Revision History     Date/Time User Provider Type Action   12/29/2014 11:06 AM Meredith Staggers, MD Physician Sign   12/29/2014 8:22 AM Cathlyn Parsons, PA-C Physician Assistant [facility] Details Report  Routing History     Date/Time From To Method   12/29/2014 11:06 AM Meredith Staggers, MD Meredith Staggers, MD In Basket   12/29/2014 11:06 AM Meredith Staggers, MD No Pcp Per Patient In Basket

## 2015-01-02 NOTE — Care Management Note (Signed)
[facility]patient [facility] Individual Statement of Services  Patient Name:  Kathryn Knapp  Date:  01/02/2015  Welcome to the [facility].  Our goal is to provide you with an individualized program based on your diagnosis and situation, designed to meet your specific needs.  With this comprehensive rehabilitation program, you will be expected to participate in at least 3 hours of rehabilitation therapies Monday-Friday, with modified therapy programming on the weekends.  Your rehabilitation program will include the following services:  Physical Therapy (PT), Occupational Therapy (OT), 24 hour per day rehabilitation nursing, Therapeutic Recreaction (TR), Neuropsychology, Case Management (Social Worker), Rehabilitation Medicine, Nutrition Services and Pharmacy Services  Weekly team conferences will be held on Wednesday to discuss your progress.  Your Social Worker will talk with you frequently to get your input and to update you on team discussions.  Team conferences with you and your family in attendance may also be held.  Expected length of stay: 2.5-3 weeks   Overall anticipated outcome: supervision/min wheelchair level  Depending on your progress and recovery, your program may change. Your Social Worker will coordinate services and will keep you informed of any changes. Your Social Worker's name and contact numbers are listed  below.  The following services may also be recommended but are not provided by the [facility] will be made to provide these services after discharge if needed.  Arrangements include referral to agencies that provide these services.  Your insurance has been verified to be:  Medicare part A only Your primary doctor is:    Pertinent information will be shared with your doctor and your insurance company.  Social  Worker:  Ovidio Kin, [place] or ([phone]  Information discussed with and copy given to patient by: [name], 01/02/2015, 10:19 AM

## 2015-01-02 NOTE — Progress Notes (Signed)
Patient complaint of shortness of breath, checked oxygen saturation it was at 85% RA. Placed on 2L oxygen via . Rechecked O2 at 95%. Repositioned patient and kept HOB elevated. Patient reports feeling better. Will monitor.

## 2015-01-02 NOTE — Consult Note (Addendum)
WOC wound follow up CCS following for assessment and plan of care to abd wound. Bilat buttocks with multiple patchy areas of moisture associated skin damage which are pink and moist, decreasing in size since last week's assessment. Foam dressing to protect and absorb moisture. 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  Pt states it has leaked in the past few days. Stomal assessment/size: Stoma red and viable, flush with skin level, 1 1/4 inches Peristomal assessment: 1 cm denuded area surrounding Output Mod amt liquid brown stool in pouch Ostomy pouching: 1 piece with barrier ring Education provided: Demonstrated pouch change using one piece pouching system with barrier ring to maintain seal. Pt assisted and asked appropriate questions. She is able to open and close velcro to empty with minimal assistance.  Will order pre-cut pouches for use at home since she has limited range of motion to right hand. Suspect pt has a pronounced skin fold when OOB which is contributing to leakage problem. Discussed use of ostomy powder to protect skin and supplies ordered to room. Discussed use of flexible convexity with belt when patient orders supplies after discharge home.  This pouch is not available in the [facility].  Applied ACE wrap as binder around pouch to help hold in place. Educational materials left at bedside, no family members present. Supplies in room for staff nurse use. Pt could benefit from home health assistance after discharge home, please order if desired. Enrolled patient in [place] Start Discharge program: Yes Julien Girt MSN, RN, [place], [place], [place]

## 2015-01-03 ENCOUNTER — Inpatient Hospital Stay (HOSPITAL_COMMUNITY): Payer: Medicare Other | Admitting: Physical Therapy

## 2015-01-03 ENCOUNTER — Inpatient Hospital Stay (HOSPITAL_COMMUNITY): Payer: Medicare Other | Admitting: Occupational Therapy

## 2015-01-03 ENCOUNTER — Telehealth: Payer: Self-pay | Admitting: Vascular Surgery

## 2015-01-03 DIAGNOSIS — E8770 Fluid overload, unspecified: Secondary | ICD-10-CM

## 2015-01-03 DIAGNOSIS — J90 Pleural effusion, not elsewhere classified: Secondary | ICD-10-CM

## 2015-01-03 DIAGNOSIS — D62 Acute posthemorrhagic anemia: Secondary | ICD-10-CM

## 2015-01-03 LAB — GLUCOSE, CAPILLARY
GLUCOSE-CAPILLARY: 103 mg/dL — AB (ref 65–99)
Glucose-Capillary: 113 mg/dL — ABNORMAL HIGH (ref 65–99)
Glucose-Capillary: 132 mg/dL — ABNORMAL HIGH (ref 65–99)
Glucose-Capillary: 141 mg/dL — ABNORMAL HIGH (ref 65–99)
Glucose-Capillary: 92 mg/dL (ref 65–99)
Glucose-Capillary: 98 mg/dL (ref 65–99)

## 2015-01-03 MED ORDER — SIMETHICONE 80 MG PO CHEW
80.0000 mg | CHEWABLE_TABLET | Freq: Four times a day (QID) | ORAL | Status: DC | PRN
  Administered 2015-01-11 – 2015-01-14 (×3): 80 mg via ORAL
  Filled 2015-01-03 (×6): qty 1

## 2015-01-03 MED ORDER — INSULIN ASPART 100 UNIT/ML ~~LOC~~ SOLN
0.0000 [IU] | Freq: Three times a day (TID) | SUBCUTANEOUS | Status: DC
  Administered 2015-01-10 – 2015-01-13 (×4): 1 [IU] via SUBCUTANEOUS
  Administered 2015-01-14: 2 [IU] via SUBCUTANEOUS
  Administered 2015-01-14 – 2015-01-18 (×5): 1 [IU] via SUBCUTANEOUS

## 2015-01-03 MED ORDER — FLUOXETINE HCL 20 MG PO CAPS
20.0000 mg | ORAL_CAPSULE | Freq: Every day | ORAL | Status: DC
  Administered 2015-01-03 – 2015-01-19 (×17): 20 mg via ORAL
  Filled 2015-01-03 (×22): qty 1

## 2015-01-03 MED ORDER — POTASSIUM CHLORIDE CRYS ER 20 MEQ PO TBCR
20.0000 meq | EXTENDED_RELEASE_TABLET | Freq: Two times a day (BID) | ORAL | Status: AC
  Administered 2015-01-03 – 2015-01-05 (×6): 20 meq via ORAL
  Filled 2015-01-03 (×6): qty 1

## 2015-01-03 MED ORDER — FUROSEMIDE 20 MG PO TABS
20.0000 mg | ORAL_TABLET | Freq: Every day | ORAL | Status: DC
  Administered 2015-01-03 – 2015-01-07 (×5): 20 mg via ORAL
  Filled 2015-01-03 (×7): qty 1

## 2015-01-03 NOTE — Telephone Encounter (Signed)
Spoke with pt, dpm °

## 2015-01-03 NOTE — Progress Notes (Signed)
Physical Therapy Session Note  Patient Details  Name: Kathryn Knapp MRN: [id number] Date of Birth: [birth year]  Today's Date: 01/03/2015 PT Individual Time: 9323-5573 PT Individual Time Calculation (min): 41 min   Short Term Goals: Week 1:  PT Short Term Goal 1 (Week 1): Patient will perform bed mobility short sit to and from supine max assist PT Short Term Goal 2 (Week 1): Patient will perfrom slide board transfer mod assist to a level surface. PT Short Term Goal 3 (Week 1): Patient will  stand with max assist and least restrictive asssistive device for one minute PT Short Term Goal 4 (Week 1): Patient will propel wheelchair 50 feet over level surfaces with stand by assistance. PT Short Term Goal 5 (Week 1): Patient will be able to educate on residual limb care with no more than minimal assistance for insepection and residual limb wrapping.   Skilled Therapeutic Interventions/Progress Updates:   Pt received in bed just following dressing change to incision.  Pt in pain but not due for pain medication for another 30 min.  Discussed with pt home set up and equipment needs at home.  Pt reports she can get a hospital bed and w/c from family member.  Discussed with pt that she would need a specialty cushion and amputee support pad and that she may require a completely new wheelchair.  Will request family to bring in w/c from home to assess fit and appropriateness.  Also discussed with pt having a peer amputee visit with her for questions and support; pt would be interested.  Pt performed bed mobility supine <> sit with mod A with focus on flexion and rolling to prevent strain on abdomen.  Performed transfers bed <> w/c with slideboard with chuck underneath buttocks due to short length of gown; required mod A with assistance to maintain head hips relationship and for balance.  In gym performed w/c propulsion and ramp negotiation training up/down x 2 reps with mod A with verbal and tactile cues for safety  and sequence.  Returned pt to room and to bed with NT present to assist with bed pan for urination.    Therapy Documentation Precautions:  Precautions Precautions: Fall Precaution Comments: non weight bearing right lower extremity  Restrictions Weight Bearing Restrictions: Yes RLE Weight Bearing: Non weight bearing Vital Signs: Therapy Vitals Temp: 97.8 F (36.6 C) Temp Source: Oral Pulse Rate: 85 Resp: 18 BP: (!) 127/55 mmHg Patient Position (if appropriate): Lying Oxygen Therapy SpO2: 97 % O2 Device: Nasal Cannula O2 Flow Rate (L/min): 2 L/min Pain: Pain Assessment Pain Assessment: 0-10 Pain Score: 6  Pain Type: Surgical pain Pain Location: Leg Pain Orientation: Right Pain Descriptors / Indicators: Aching Pain Onset: On-going Patients Stated Pain Goal: 2 Pain Intervention(s): Medication (See eMAR)  See FIM for current functional status  Therapy/Group: Individual Therapy  Raylene Everts [facility] 01/03/2015, 5:14 PM

## 2015-01-03 NOTE — Progress Notes (Signed)
Social Work Patient ID: Kathryn Knapp, female   DOB: 09/09/1940, 74 y.o.   MRN: [id number] Met with pt and spoke with daughter Rhonda via telephone to discuss team conference goals-supervision/min wheelchair level and target discharge date 6/3. Both are pleased with the date and the plan.  Pt feels better today and informed therapists can see a difference form yesterday to today.  Weaning off O2 today. Pt did apply for Medicaid today and is glad to have this started.  Daughter will be up when team needs her to be.  Will work on discharge plans. 

## 2015-01-03 NOTE — Progress Notes (Signed)
Occupational Therapy Session Note  Patient Details  Name: Kathryn Knapp MRN: [id number] Date of Birth: 1941-01-07  Today's Date: 01/03/2015 OT Individual Time: 5809-9833 and 1400-1500 OT Individual Time Calculation (min): 59 min and 60 min   Short Term Goals: Week 1:  OT Short Term Goal 1 (Week 1): Pt will complete toilet transfer to drop arm BSC with max assist of one caregiver OT Short Term Goal 2 (Week 1): Pt will complete LB dressing with max assist of one caregiver OT Short Term Goal 3 (Week 1): Pt will complete bathing with min assist OT Short Term Goal 4 (Week 1): Pt will complete toileting with max assist of one caregiver  Skilled Therapeutic Interventions/Progress Updates:    1) Engaged in ADL retraining with focus on bed mobility, lateral leans, dynamic sitting balance, and transfers.  Bathing and dressing completed seated at EOB with pt requiring mod assist supine > sit initially however throughout session pt able to complete sidelying to sit with supervision without bed rails while completing LB dressing.  Pt maintained sitting EOB without assist for sitting balance approx 30 mins this session.  Utilized lateral leans to complete perineal hygiene and clothing management.  Sit > stand with +2 to complete pulling underwear over hips after unsuccessful attempts with lateral leans.  Pt continues to be very fearful of falling with standing, reassurance provided throughout session.  Lateral leans to don pants with therapist completing clothing management.  Slide board transfer bed > w/c to Lt with max assist and multimodal cues for weight shift and sequencing.  Pt tearful during session with statements of being overwhelmed and having difficulty coping with all the changes.  Notified SWK of recommendation for neuropsych visit/consult.  2) Engaged in therapeutic activity with focus on bed mobility, lateral leans, and dynamic sitting balance.  Pt in bed upon arrival, reports having shooting pain  in head that came on suddenly and quickly resolved and with no pain at this time.  Engaged in bed mobility with focus on increased trunk control with mobility and decreased use of bed rails.  Pt supervision with rolling to Rt and min assist or use of bed rails when rolling to Lt.  Discussed pressure relief on buttocks with increased sidelying when in bed.  Mod assist sidelying to sit initially followed by supervision post lateral lean activity.  Lateral leans at EOB with focus on reaching to simulate clothing management.  Pt reports urgent need to void urine, utilized bed pan with additional focus on rolling.  Returned to sitting with pt completing reaching activity with pt reporting fear of popping ostomy bag.  Increased challenge with attempts to lift LLE to place on target, with pt unable to lift leg (complete hip flexion) but able to kick leg out forward.  Pt returned to supine in bed with min assist to lift LLE into bed and mod cues for improved technique.  Therapy Documentation Precautions:  Precautions Precautions: Fall Precaution Comments: non weight bearing right lower extremity  Restrictions Weight Bearing Restrictions: Yes RLE Weight Bearing: Non weight bearing Pain:  Pt reports pain of 6/10, premedicated  See FIM for current functional status  Therapy/Group: Individual Therapy  Simonne Come 01/03/2015, 11:02 AM

## 2015-01-03 NOTE — Telephone Encounter (Signed)
-----   Message from Mena Goes, RN sent at 01/01/2015  4:25 PM EDT ----- Regarding: Scheduling    ----- Message -----    From: Conrad [place], MD    Sent: 01/01/2015   9:12 AM      To: [facility] Charge [address]  Kathryn Knapp [id number] 11/01/1940  Needs follow-up appointment in 4 weeks for staple removal (will need different attending as [place])

## 2015-01-03 NOTE — Progress Notes (Signed)
Physical Therapy Session Note  Patient Details  Name: Kathryn Knapp MRN: [id number] Date of Birth: 09/08/1940  Today's Date: 01/03/2015 PT Individual Time: 0929-1030 PT Individual Time Calculation (min): 61 min   Short Term Goals: Week 1:  PT Short Term Goal 1 (Week 1): Patient will perform bed mobility short sit to and from supine max assist PT Short Term Goal 2 (Week 1): Patient will perfrom slide board transfer mod assist to a level surface. PT Short Term Goal 3 (Week 1): Patient will  stand with max assist and least restrictive asssistive device for one minute PT Short Term Goal 4 (Week 1): Patient will propel wheelchair 50 feet over level surfaces with stand by assistance. PT Short Term Goal 5 (Week 1): Patient will be able to educate on residual limb care with no more than minimal assistance for insepection and residual limb wrapping.     Therapy Documentation Precautions:  Precautions Precautions: Fall Precaution Comments: non weight bearing right lower extremity  Restrictions Weight Bearing Restrictions: Yes RLE Weight Bearing: Non weight bearing Pain: Patient denies any pain     Patient propelled wheelchair with bilateral upper extremities 50 and 40 feet close supervision and minimal assist for turns.   Sit to and from stand transfer total assistx1. Patient stood for approximately 15 seconds with left lower extremity, right lower extremity non weight bearing total assist.    Seated ther ex: B elbow flex 3x10 with 5 pound wand B chest press 3x10 with 5 pound wand. Quad sets 10x right lower extremity with a 5 second hold.  Patient performed slide board transfer to and from mat max assistx1. Patient utilized push up blocks on mat with min assist for force production 10x.  Short sit to and from supine on mat min assist Supine ther ex: Bridging with left lower extremity 30x with min assist.  Residual limb inspection; staple appear intact mild redness noted. Residual  limb is bulbous in shape. Minimal serosanguinous drainage noted at end of session. Dressing and ACE wrap removed at end of session RN Audrea Muscat notified and aware. Patient educated and observed figure eight wrapping of residual. 8x10 abdominal pad applied with kerlix and two ACE wrap in figure 8 pattern applied for shaping of residual limb. Patient was without discomfort at end of session.     Patient educated to performing ankle pumps left lower extremity throughout the day in order to promote circulation and decrease swelling. Patient remained seated upright in wheelchair at end of session with all needs met and call bell within reach. Patient remained off supplemental O2 throughout session and O2 never dropped below 92% on room air. Vitals monitored and remained stable throughout session and responded appropriately with activity. Patient educated not to be up without assistance. Patient verbalized understanding.  Therapy/Group: Individual Therapy  [name] 01/03/2015, 10:39 AM

## 2015-01-03 NOTE — Patient Care Conference (Signed)
Inpatient RehabilitationTeam Conference and Plan of Care Update Date: 01/03/2015   Time: 10;50 AM    Patient Name: Kathryn Knapp      Medical Record Number: [id number]  Date of Birth: June 15, 1941 Sex: Female         Room/Bed: 4W12C/4W12C-01 Payor Info: Payor: MEDICARE / Plan: MEDICARE PART A / Product Type: *No Product type* /    Admitting Diagnosis: R BKA AND RECENT EXPL LAP COLOSTOMY  Admit Date/Time:  01/01/2015  2:07 PM Admission Comments: No comment available   Primary Diagnosis:  Amputation of right lower extremity below knee Principal Problem: Amputation of right lower extremity below knee  Patient Active Problem List   Diagnosis Date Noted  . Amputation of right lower extremity below knee 01/01/2015  . S/P exploratory laparotomy   . S/P unilateral BKA (below knee amputation)   . Abdominal pain   . Pain in joint, lower leg   . Palliative care encounter   . Acute respiratory failure with hypoxia   . Thrombus   . Acute renal failure syndrome   . Peritonitis   . Colon cancer metastasized to multiple sites   . Acute blood loss anemia   . Melena   . Diabetes type 2, controlled   . Hypokalemia   . Hypomagnesemia   . Depression   . Noncompliance with medication regimen 12/23/2014  . S/P partial colectomy 12/17/2014  . Severe sepsis with acute organ dysfunction 12/17/2014  . Septic shock   . Bowel obstruction 12/16/2014  . Colonic mass 12/16/2014    Expected Discharge Date: Expected Discharge Date: 01/19/15  Team Members Present: Physician leading conference: Dr. Alysia Penna Social Worker Present: Ovidio Kin, LCSW Nurse Present: Heather Roberts, RN PT Present: Jorge Mandril, Renaye Rakers, PT OT Present: Simonne Come, Dorothyann Gibbs, OT SLP Present: Windell Moulding, SLP PPS Coordinator present : Daiva Nakayama, RN, CRRN     Current Status/Progress Goal Weekly Team Focus  Medical   poor endurance , hypoxia, depression  maintain medical stability for home d/c  build  endurance   Bowel/Bladder   Continent urine; Illeostomy patent  Manage illeostomy min assist  Educate, moniotr   Swallow/Nutrition/ Hydration             ADL's   +2 for toileting and LB dressing at sit > stand level, max assist slide board transfer, min assist dynamic sitting balance  supervision overall, min assist transfers  transfers, residual limb care, dynamic sitting balance   Mobility   Max asssit for slide board transfer, total assist for residual limb care and standing, min assist wheelchair mobility.   SUpervision to min assist overall   Transfers, wheelchair management/mobility, residual limb care    Communication     na        Safety/Cognition/ Behavioral Observations    no unsafe behaviors        Pain   Phantom pain rt. BKA;  Abd. pain, 2 percocet 2-3 times day, effective.  Managed at goal  Offer PRNs, monitor.  Educatet on phantom pain, methods for relief.   Skin   Abd. incision wet to dry dressing; yellow tissue at base, moderate amount yellowish drainage, no odor.  Shearing injury lt buttocks, resolving MASD; Rt. BKA with stables CDI, mild redness at line.  Healng process at D/C.  Monitor, reposition      *See Care Plan and progress notes for long and short-term goals.  Barriers to Discharge: se above    Possible Resolutions to Barriers:  cont rehab    Discharge Planning/Teaching Needs:  Home with daughter and grand children-family very committed to pt and will do whatever is needed for her      Team Discussion:  Goals-supervision/min wheelchair level-building up endurance.  Learning ostomy care.  Off O2 today. Chest x-ray-volume overload-staring lasix today, but will watch potassium level. Neuro-psych to see for coping.  Doing better today than yesterday.  Revisions to Treatment Plan:  None   Continued Need for Acute Rehabilitation Level of Care: The patient requires daily medical management by a physician with specialized training in physical medicine and  rehabilitation for the following conditions: Daily medical management of patient stability for increased activity during participation in an intensive rehabilitation regime.: Yes Daily analysis of laboratory values and/or radiology reports with any subsequent need for medication adjustment of medical intervention for : Post surgical problems;Other  [name] 01/04/2015, 8:34 AM

## 2015-01-03 NOTE — Progress Notes (Signed)
74 y.o. right handed female with history of hypertension as well as diabetes mellitus. Independent prior to admission living with her daughter and grandchildren. Presented 12/16/2014 with nonspecific abdominal pain as well as nausea vomiting. He also had some recent weight loss. CT abdomen revealed a mass in the splenic flexure as well as nodule left adrenal gland. Abdominal pain progressed follow-up scan revealed some free fluid and air. Underwent exploratory laparotomy right transverse and partial left colectomy with en block partial gastrectomy, and ileostomy 12/17/2014 per Dr. Grandville Silos. Pathology report adenocarcinoma with metastases and await plan of care to be established as outpatient with oncology services Dr Burr Medico. Patient required ventilatory management followed by critical care postoperatively. Wound care nurse consulted for sacral decubitus with dressing change recommendations. She was extubated 12/21/2014 with slow progress. Palliative care consulted 12/27/2014. 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 Bridgett Larsson. Acute blood loss anemia 7.2-9.6 and monitored.  Subjective/Complaints: O2 is off, pt breathing comfortably in PT No issues reported overnite PT reports increased swelling in LLE No nausea or vomiting abd incisional pain   Objective: Vital Signs: Blood pressure 148/64, pulse 92, temperature 97.7 F (36.5 C), temperature source Oral, resp. rate 19, height _0  (1.803 m), SpO2 98 %. Dg Chest 2 View  01/02/2015   CLINICAL DATA:  Shortness of Breath  EXAM: CHEST  2 VIEW  COMPARISON:  12/21/2014  FINDINGS: Cardiac shadow is enlarged. Bilateral pleural effusions are noted which have increased in the interval from the prior exam. The left effusion is larger than  the right. Mild vascular congestion is seen. No acute bony abnormality is noted.  IMPRESSION: Increasing bilateral pleural effusions left greater than right.   Electronically Signed   By: [name] Catalina M.D.   On: 01/02/2015 16:05   Results for orders placed or performed during the hospital encounter of 01/01/15 (from the past 72 hour(s))  CBC     Status: Abnormal   Collection Time: 01/01/15  3:37 PM  Result Value Ref Range   WBC 14.9 (H) 4.0 - 10.5 K/uL   RBC 2.57 (L) 3.87 - 5.11 MIL/uL   Hemoglobin 6.9 (LL) 12.0 - 15.0 g/dL    Comment: REPEATED TO VERIFY CRITICAL RESULT CALLED TO, READ BACK BY AND VERIFIED WITH: L ROSERO,RN 1703 01/01/15 D BRADLEY    HCT 21.2 (L) 36.0 - 46.0 %   MCV 82.5 78.0 - 100.0 fL   MCH 26.8 26.0 - 34.0 pg   MCHC 32.5 30.0 - 36.0 g/dL   RDW 18.1 (H) 11.5 - 15.5 %   Platelets 458 (H) 150 - 400 K/uL  Creatinine, serum     Status: Abnormal   Collection Time: 01/01/15  3:37 PM  Result Value Ref Range   Creatinine, Ser 1.02 (H) 0.44 - 1.00 mg/dL   GFR calc non Af Amer 53 (L) >60 mL/min   GFR calc Af Amer >60 >60 mL/min    Comment: (NOTE) The eGFR has been calculated using the CKD EPI equation. This calculation has not been validated in all clinical situations. eGFR's persistently <60 mL/min signify possible Chronic Kidney Disease.   Glucose, capillary     Status: Abnormal   Collection Time: 01/01/15  5:19 PM  Result Value Ref Range   Glucose-Capillary 110 (H) 65 - 99 mg/dL   Comment 1 Notify RN  CBC     Status: Abnormal   Collection Time: 01/01/15  6:32 PM  Result Value Ref Range   WBC 12.4 (H) 4.0 - 10.5 K/uL   RBC 3.30 (L) 3.87 - 5.11 MIL/uL   Hemoglobin 8.8 (L) 12.0 - 15.0 g/dL    Comment: REPEATED TO VERIFY   HCT 27.8 (L) 36.0 - 46.0 %   MCV 84.2 78.0 - 100.0 fL   MCH 26.7 26.0 - 34.0 pg   MCHC 31.7 30.0 - 36.0 g/dL   RDW 18.1 (H) 11.5 - 15.5 %   Platelets 359 150 - 400 K/uL  Glucose, capillary     Status: Abnormal   Collection Time: 01/01/15   9:05 PM  Result Value Ref Range   Glucose-Capillary 111 (H) 65 - 99 mg/dL  Glucose, capillary     Status: None   Collection Time: 01/02/15  2:26 AM  Result Value Ref Range   Glucose-Capillary 92 65 - 99 mg/dL  Glucose, capillary     Status: None   Collection Time: 01/02/15  4:45 AM  Result Value Ref Range   Glucose-Capillary 99 65 - 99 mg/dL  CBC WITH DIFFERENTIAL     Status: Abnormal   Collection Time: 01/02/15  5:39 AM  Result Value Ref Range   WBC 11.4 (H) 4.0 - 10.5 K/uL   RBC 3.11 (L) 3.87 - 5.11 MIL/uL   Hemoglobin 8.5 (L) 12.0 - 15.0 g/dL   HCT 26.6 (L) 36.0 - 46.0 %   MCV 85.5 78.0 - 100.0 fL   MCH 27.3 26.0 - 34.0 pg   MCHC 32.0 30.0 - 36.0 g/dL   RDW 18.0 (H) 11.5 - 15.5 %   Platelets 326 150 - 400 K/uL   Neutrophils Relative % 84 (H) 43 - 77 %   Neutro Abs 9.5 (H) 1.7 - 7.7 K/uL   Lymphocytes Relative 8 (L) 12 - 46 %   Lymphs Abs 0.9 0.7 - 4.0 K/uL   Monocytes Relative 8 3 - 12 %   Monocytes Absolute 0.9 0.1 - 1.0 K/uL   Eosinophils Relative 0 0 - 5 %   Eosinophils Absolute 0.1 0.0 - 0.7 K/uL   Basophils Relative 0 0 - 1 %   Basophils Absolute 0.0 0.0 - 0.1 K/uL  Comprehensive metabolic panel     Status: Abnormal   Collection Time: 01/02/15  5:39 AM  Result Value Ref Range   Sodium 138 135 - 145 mmol/L   Potassium 3.4 (L) 3.5 - 5.1 mmol/L   Chloride 103 101 - 111 mmol/L   CO2 28 22 - 32 mmol/L   Glucose, Bld 98 65 - 99 mg/dL   BUN 8 6 - 20 mg/dL   Creatinine, Ser 0.88 0.44 - 1.00 mg/dL   Calcium 7.4 (L) 8.9 - 10.3 mg/dL   Total Protein 4.7 (L) 6.5 - 8.1 g/dL   Albumin <1.0 (L) 3.5 - 5.0 g/dL   AST 20 15 - 41 U/L   ALT 13 (L) 14 - 54 U/L   Alkaline Phosphatase 112 38 - 126 U/L   Total Bilirubin 0.3 0.3 - 1.2 mg/dL   GFR calc non Af Amer >60 >60 mL/min   GFR calc Af Amer >60 >60 mL/min    Comment: (NOTE) The eGFR has been calculated using the CKD EPI equation. This calculation has not been validated in all clinical situations. eGFR's persistently <60  mL/min signify possible Chronic Kidney Disease.    Anion gap 7 5 - 15  Glucose,  capillary     Status: Abnormal   Collection Time: 01/02/15 10:06 AM  Result Value Ref Range   Glucose-Capillary 118 (H) 65 - 99 mg/dL  Glucose, capillary     Status: Abnormal   Collection Time: 01/02/15 12:07 PM  Result Value Ref Range   Glucose-Capillary 121 (H) 65 - 99 mg/dL  Glucose, capillary     Status: Abnormal   Collection Time: 01/02/15  4:34 PM  Result Value Ref Range   Glucose-Capillary 132 (H) 65 - 99 mg/dL  Glucose, capillary     Status: Abnormal   Collection Time: 01/02/15  8:35 PM  Result Value Ref Range   Glucose-Capillary 114 (H) 65 - 99 mg/dL  Glucose, capillary     Status: None   Collection Time: 01/03/15 12:53 AM  Result Value Ref Range   Glucose-Capillary 98 65 - 99 mg/dL  Glucose, capillary     Status: None   Collection Time: 01/03/15  4:06 AM  Result Value Ref Range   Glucose-Capillary 92 65 - 99 mg/dL  Glucose, capillary     Status: Abnormal   Collection Time: 01/03/15  8:27 AM  Result Value Ref Range   Glucose-Capillary 113 (H) 65 - 99 mg/dL     HEENT: poor dentition Cardio: RRR and tachy Resp: CTA B/L and unlabored GI: BS positive and midline incision RLQ colostomy Extremity:  Edema Right stump Skin:   Wound C/D/I Neuro: Alert/Oriented, Anxious and Abnormal Motor I/5 bilateral deltoids, biceps, triceps, grip, 4/5 right hip flexor, 4/5 left hip flexor knee extensor ankle dorsiflexor Musc/Skel:  Other right BKA Gen. no acute distress   Assessment/Plan: 1. 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 Physiatrist is providing close team supervision and 24 hour management of active medical problems listed below. Physiatrist and rehab team continue to assess barriers to discharge/monitor patient  progress toward functional and medical goals. Team conference today please see physician documentation under team conference tab, met with team face-to-face to discuss problems,progress, and goals. Formulized individual treatment plan based on medical history, underlying problem and comorbidities. FIM: FIM - Bathing Bathing Steps Patient Completed: Chest, Right Arm, Left Arm, Abdomen, Right upper leg, Left upper leg Bathing: 3: Mod-Patient completes 5-7 20f10 parts or 50-74%  FIM - Upper Body Dressing/Undressing Upper body dressing/undressing steps patient completed: Thread/unthread right sleeve of pullover shirt/dresss, Thread/unthread left sleeve of pullover shirt/dress, Put head through opening of pull over shirt/dress, Pull shirt over trunk Upper body dressing/undressing: 5: Set-up assist to: Obtain clothing/put away FIM - Lower Body Dressing/Undressing Lower body dressing/undressing: 1: Two helpers  FIM - Toileting Toileting: 1: Two helpers  FIM - TRadio producerDevices: BEngineer, civil (consulting) SEngineer, miningTransfers: 1-Two helpers  FIM - BControl and instrumentation engineerDevices: Bed rails, Sliding board Bed/Chair Transfer: 3: Supine > Sit: Mod A (lifting assist/Pt. 50-74%/lift 2 legs, 3: Sit > Supine: Mod A (lifting assist/Pt. 50-74%/lift 2 legs), 2: Bed > Chair or W/C: Max A (lift and lower assist), 2: Chair or W/C > Bed: Max A (lift and lower assist)  FIM - Locomotion: Wheelchair Distance: 25 feet  Locomotion: Wheelchair: 1: Travels less than 50 ft with minimal assistance (Pt.>75%) FIM - Locomotion: Ambulation Ambulation/Gait Assistance: Not tested (comment) (Unsafe to attempt at this time secondary to total assist for static standing balance 30 seconds ) Locomotion: Ambulation:  0: Activity did not occur  Comprehension Comprehension Mode: Auditory Comprehension: 5-Follows basic conversation/direction: With no  assist  Expression Expression Mode: Verbal Expression: 5-Expresses basic needs/ideas: With extra time/assistive device  Social Interaction Social Interaction: 5-Interacts appropriately 90% of the time - Needs monitoring or encouragement for participation or interaction.  Problem Solving Problem Solving: 5-Solves basic 90% of the time/requires cueing < 10% of the time  Memory Memory: 4-Recognizes or recalls 75 - 89% of the time/requires cueing 10 - 24% of the time  Medical Problem List and Plan: 1. 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  DVT Prophylaxis/Anticoagulation: Subcutaneous Lovenox. Monitor platelet counts and any signs of bleeding 3. Pain Management: Neurontin 100 mg 3 times a day, Oxycodone. Monitor with increased mobility 4. Mood/depression: Prozac 20 mg daily. 5. Neuropsych: This patient is capable of making decisions on her own behalf. 6. Skin/Wound Care: Local care to right BKA site as well as abdominal wound/ostomy and deep tissue injury to buttocks and sacrum.  WOC RN following for wound care. Has had some issues with seal of ostomy 7. Fluids/Electrolytes/Nutrition: Routine I nose with follow-up chemistries, prob fluid overload will gently diurese 8. Acute blood loss anemia. Follow-up CBC tonight 9. Diabetes mellitus with peripheral neuropathy. Hemoglobin A1c 6.3. Sliding scale insulin. Check blood sugars before meals and at bedtime 10. Hypertension. Norvasc 5 mg daily. 11.  Hypoxia improved, recheck chest x-ray shows pleural effusion, continue prn supplemental O2, afebrile, lung exam with reduced BS, will diurese 12.  HypoK mild will supplement and watch during diuresis LOS (Days) 2 A FACE TO FACE EVALUATION WAS PERFORMED  Camrin Lapre E 01/03/2015, 10:00 AM

## 2015-01-04 ENCOUNTER — Inpatient Hospital Stay (HOSPITAL_COMMUNITY): Payer: Medicare Other | Admitting: Physical Therapy

## 2015-01-04 ENCOUNTER — Inpatient Hospital Stay (HOSPITAL_COMMUNITY): Payer: Medicare Other | Admitting: Occupational Therapy

## 2015-01-04 LAB — CBC
HCT: 27.7 % — ABNORMAL LOW (ref 36.0–46.0)
HEMOGLOBIN: 8.5 g/dL — AB (ref 12.0–15.0)
MCH: 26.1 pg (ref 26.0–34.0)
MCHC: 30.7 g/dL (ref 30.0–36.0)
MCV: 85 fL (ref 78.0–100.0)
PLATELETS: 345 10*3/uL (ref 150–400)
RBC: 3.26 MIL/uL — AB (ref 3.87–5.11)
RDW: 17.8 % — ABNORMAL HIGH (ref 11.5–15.5)
WBC: 8.9 10*3/uL (ref 4.0–10.5)

## 2015-01-04 LAB — GLUCOSE, CAPILLARY
GLUCOSE-CAPILLARY: 94 mg/dL (ref 65–99)
Glucose-Capillary: 91 mg/dL (ref 65–99)
Glucose-Capillary: 91 mg/dL (ref 65–99)
Glucose-Capillary: 107 mg/dL — ABNORMAL HIGH (ref 65–99)

## 2015-01-04 NOTE — Progress Notes (Signed)
Social Work [name], LCSW Social Worker Signed  Patient Care Conference 01/03/2015 12:46 PM    Expand All Collapse All   Inpatient RehabilitationTeam Conference and Plan of Care Update Date: 01/03/2015   Time: 10;50 AM     Patient Name: Kathryn Knapp       Medical Record Number: [id number]  Date of Birth: 21-Jul-1941 Sex: Female         Room/Bed: 4W12C/4W12C-01 Payor Info: Payor: MEDICARE / Plan: MEDICARE PART A / Product Type: *No Product type* /    Admitting Diagnosis: 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  Amputation of right lower extremity below knee  01/01/2015   .  S/P exploratory laparotomy     .  S/P unilateral BKA (below knee amputation)     .  Abdominal pain     .  Pain in joint, lower leg     .  Palliative care encounter     .  Acute respiratory failure with hypoxia     .  Thrombus     .  Acute renal failure syndrome     .  Peritonitis     .  Colon cancer metastasized to multiple sites     .  Acute blood loss anemia     .  Melena     .  Diabetes type 2, controlled     .  Hypokalemia     .  Hypomagnesemia     .  Depression     .  Noncompliance with medication regimen  12/23/2014   .  S/P partial colectomy  12/17/2014   .  Severe sepsis with acute organ dysfunction  12/17/2014   .  Septic shock     .  Bowel obstruction  12/16/2014   .  Colonic mass  12/16/2014     Expected Discharge Date: Expected Discharge Date: 01/19/15  Team Members Present: Physician leading conference: Dr. Alysia Penna Social Worker Present: Ovidio Kin, LCSW Nurse Present: Heather Roberts, RN PT Present: Jorge Mandril, Renaye Rakers, PT OT Present: Simonne Come, Dorothyann Gibbs, OT SLP Present:  Windell Moulding, SLP PPS Coordinator present : Daiva Nakayama, RN, CRRN        Current Status/Progress  Goal  Weekly Team Focus   Medical     poor endurance , hypoxia, depression   maintain medical stability for home d/c   build endurance   Bowel/Bladder     Continent urine; Illeostomy patent   Manage illeostomy min assist  Educate, moniotr   Swallow/Nutrition/ Hydration               ADL's     +2 for toileting and LB dressing at sit > stand level, max assist slide board transfer, min assist dynamic sitting balance   supervision overall, min assist transfers   transfers, residual limb care, dynamic sitting balance    Mobility     Max asssit for slide board transfer, total assist for residual limb care and standing, min assist wheelchair mobility.   SUpervision to min assist overall    Transfers, wheelchair management/mobility, residual limb care     Communication       na  Safety/Cognition/ Behavioral Observations      no unsafe behaviors         Pain     Phantom pain rt. BKA;  Abd. pain, 2 percocet 2-3 times day, effective.  Managed at goal  Offer PRNs, monitor.  Educatet on phantom pain, methods for relief.   Skin     Abd. incision wet to dry dressing; yellow tissue at base, moderate amount yellowish drainage, no odor. Shearing injury lt buttocks, resolving MASD; Rt. BKA with stables CDI, mild redness at line.   Healng process at D/C.  Monitor, reposition      *See Care Plan and progress notes for long and short-term goals.    Barriers to Discharge:  se above     Possible Resolutions to Barriers:   cont rehab     Discharge Planning/Teaching Needs:   Home with daughter and grand children-family very committed to pt and will do whatever is needed for her       Team Discussion:    Goals-supervision/min wheelchair level-building up endurance.  Learning ostomy care.  Off O2 today. Chest x-ray-volume overload-staring lasix today, but will watch potassium level. Neuro-psych to  see for coping.  Doing better today than yesterday.   Revisions to Treatment Plan:    None    Continued Need for Acute Rehabilitation Level of Care: The patient requires daily medical management by a physician with specialized training in physical medicine and rehabilitation for the following conditions: Daily medical management of patient stability for increased activity during participation in an intensive rehabilitation regime.: Yes Daily analysis of laboratory values and/or radiology reports with any subsequent need for medication adjustment of medical intervention for : Post surgical problems;Other  [name] 01/04/2015, 8:34 AM                  Patient ID: Kathryn Knapp, female   DOB: 19-Apr-1941, 74 y.o.   MRN: [id number]

## 2015-01-04 NOTE — Progress Notes (Addendum)
Occupational Therapy Session Note  Patient Details  Name: Kathryn Knapp MRN: [id number] Date of Birth: 12/12/1940  Today's Date: 01/04/2015 OT Individual Time: 0354-6568 and 1300-1430 OT Individual Time Calculation (min): 35 min and 90 min OT Missed Time: 25 Minutes Missed Time Reason: Patient fatigue;Patient unwilling/refused to participate without medical reason   Short Term Goals: Week 1:  OT Short Term Goal 1 (Week 1): Pt will complete toilet transfer to drop arm BSC with max assist of one caregiver OT Short Term Goal 2 (Week 1): Pt will complete LB dressing with max assist of one caregiver OT Short Term Goal 3 (Week 1): Pt will complete bathing with min assist OT Short Term Goal 4 (Week 1): Pt will complete toileting with max assist of one caregiver  Skilled Therapeutic Interventions/Progress Updates:    1) Engaged in ADL retraining with focus on bed mobility and rolling while completing LB dressing.  Pt in bed upon arrival, reports feeling overwhelmed and not wanting to get OOB this session.  Pt requesting to remain in bed and turn the lights out, not wanting to participate.  Discussed stages of loss and purpose of therapy with pt reporting understanding and desire to get better but feeling the need to rest.  Completed LB dressing at bed level with pt able to roll Rt and Lt with use of bed rails and supervision.  Pt tearful throughout session and constantly reporting overwhelmed and this is "too much".  Pt left in bed, notified PT of current mood.  2) Engaged in therapeutic activity with focus on transfer, dynamic sitting balance, BUE strengthening, and lateral leans.  Performed multiple slide board transfers throughout session with pt demonstrating carryover of head hip relationship and improved participation with each transfer progressing from max to mod by end of the session.  Seated on edge of mat, engaged in BUE strengthening and dynamic sitting balance while completing ball batting  activity with pt using 2# dowel rod in BUE to bump ball back to therapist.  Lateral leans with placing discs under buttocks and leaning to retrieve to simulate LB clothing management as with dressing or toileting.  Pt continues to demonstrate difficulty with lifting LLE off floor with hip flexion.  Educated pt on use of reacher and sock aid to assist with LB dressing tasks.  Therapy Documentation Precautions:  Precautions Precautions: Fall Precaution Comments: non weight bearing right lower extremity  Restrictions Weight Bearing Restrictions: Yes RLE Weight Bearing: Non weight bearing General: General OT Amount of Missed Time: 25 Minutes Vital Signs: Therapy Vitals Pulse Rate: 89 BP: (!) 145/63 mmHg Pain: Pt wit no c/o pain  See FIM for current functional status  Therapy/Group: Individual Therapy  Simonne Come 01/04/2015, 9:18 AM

## 2015-01-04 NOTE — Plan of Care (Signed)
Problem: RH SKIN INTEGRITY Goal: RH STG ABLE TO PERFORM INCISION/WOUND CARE W/ASSISTANCE STG Able To Perform Incision/Wound Care With Mod. Assistance.  Outcome: Not Progressing Requires total assist at this time. Patient not wanting to manage colostomy bag

## 2015-01-04 NOTE — Progress Notes (Signed)
Patient now on air overlay mattress. Patient demonstrates turning self in bed for pressure relief. Continue with plan of care.

## 2015-01-04 NOTE — Progress Notes (Signed)
Physical Therapy Session Note  Patient Details  Name: Kathryn Knapp MRN: [id number] Date of Birth: 03-06-1941  Today's Date: 01/04/2015 PT Individual Time: 2080-2233 PT Individual Time Calculation (min): 62 min   Short Term Goals: Week 1:  PT Short Term Goal 1 (Week 1): Patient will perform bed mobility short sit to and from supine max assist PT Short Term Goal 2 (Week 1): Patient will perfrom slide board transfer mod assist to a level surface. PT Short Term Goal 3 (Week 1): Patient will  stand with max assist and least restrictive asssistive device for one minute PT Short Term Goal 4 (Week 1): Patient will propel wheelchair 50 feet over level surfaces with stand by assistance. PT Short Term Goal 5 (Week 1): Patient will be able to educate on residual limb care with no more than minimal assistance for insepection and residual limb wrapping.   Therapy Documentation Precautions:  Precautions Precautions: Fall Precaution Comments: non weight bearing right lower extremity  Restrictions Weight Bearing Restrictions: Yes RLE Weight Bearing: Non weight bearing Vital Signs: Therapy Vitals Pulse Rate: 89 BP: (!) 145/63 mmHg Pain: Pain Assessment Pain Assessment: No/denies pain  Patient received in bed at beginning of session. Patient self reports feeling frustrated and upset with current health status. Patient educated on importance of therapy and weight shifting secondary to pressure ulcer on sacrum. Patient verbalized understanding and in agreement with recommendations.   Short sit to and from supine in bed moderate assistance.  Slide board transfer to and from bed and wheelchair mod assist. Patient demonstrates improved carry-over with sequence and technique.   Wheelchair modifications made to wheelchair added a ROHO agility back in order to decrease sacral sitting and posterior pelvic tilt.  Patient propelled wheelchair with bilateral upper extremities 65 and 75 feet close  supervision on smooth level surfaces. Patient required one episode of min assist secondary to obstacle negotiation.  Patient performed wheelchair push-ups 10x with min assistance.  Patient performed arm bike for 3 minutes in one minute increments secondary to decreased cardiovascular endurance.   supine ther ex: Right hip flexion 30x Right hip abduction 30x AAROM Left hip abduction 30x Quad sets 30x bilateral lower extremity with a 5 second hold.   Patient educated to perform quad sets and hip abduction and flexion with right and left lower extremity throughout the day. Patient returned to bed at end of session with all needs met and call bell within reach. Patient remained off supplemental O2 throughout session and O2 never dropped below 91% on room air. Vitals monitored and remained stable throughout session and responded appropriately with activity. Patient emotionally labile throughout session patient was reassured and provided further education on progress and goals. Patient educated not to be up without assistance. Patient verbalized understanding.   Therapy/Group: Individual Therapy  [name] 01/04/2015, 10:25 AM

## 2015-01-04 NOTE — IPOC Note (Signed)
Overall Plan of Care [facility]) Patient Details Name: Kathryn Knapp MRN: [id number] DOB: 29-Nov-1940  Admitting Diagnosis: R [facility] Problems: Principal Problem:   Amputation of right lower extremity below knee Active Problems:   S/P partial colectomy   Colon cancer metastasized to multiple sites   Acute blood loss anemia   Diabetes type 2, controlled     Functional Problem List: Nursing Bowel, Endurance, Motor, Pain, Safety, Skin Integrity  PT Balance, Pain, Safety, Skin Integrity, Motor, Endurance, Edema  OT Balance, Endurance, Motor, Pain, Safety  SLP    TR         Basic ADL's: OT Grooming, Bathing, Dressing, Toileting     Advanced  ADL's: OT       Transfers: PT Bed Mobility, Bed to Chair, Car, Chief Operating Officer: PT Wheelchair Mobility, Ambulation, Other (comment) (ramp )     Additional Impairments: OT None  SLP        TR      Anticipated Outcomes Item Anticipated Outcome  Self Feeding not a goal  Swallowing      Basic self-care  Supervision  Toileting  Min assist   Bathroom Transfers Min assist  Bowel/Bladder  Continent to bladder.Ileostomy with the normal function.  Transfers  min assist   Locomotion  Wheelchair level supervision   Communication     Cognition     Pain  Less than 3,on scale 1 to 10.  Safety/Judgment  Pt. will be free from falls during her stay in rehab.   Therapy Plan: PT Intensity: Minimum of 1-2 x/day ,45 to 90 minutes PT Frequency: 5 out of 7 days PT Duration Estimated Length of Stay: 17-21 days  OT Intensity: Minimum of 1-2 x/day, 45 to 90 minutes OT Frequency: 5 out of 7 days OT Duration/Estimated Length of Stay: 17-20 days         Team Interventions: Nursing Interventions Patient/Family Education, Bowel Management, Pain Management, Skin Care/Wound Management  PT interventions Discharge planning, DME/adaptive equipment instruction, Functional mobility training,  Pain management, Therapeutic Activities, UE/LE Strength taining/ROM, Wheelchair propulsion/positioning, Therapeutic Exercise, Skin care/wound management, Patient/family education, Neuromuscular re-education, Community reintegration, Training and development officer  OT Interventions Training and development officer, Discharge planning, Disease mangement/prevention, DME/adaptive equipment instruction, Functional mobility training, Pain management, Patient/family education, Psychosocial support, Self Care/advanced ADL retraining, Skin care/wound managment, Therapeutic Activities, Therapeutic Exercise, UE/LE Strength taining/ROM  SLP Interventions    TR Interventions    SW/CM Interventions Discharge Planning, Psychosocial Support, Patient/Family Education    Team Discharge Planning: Destination: PT-Home ,OT- Home , SLP-  Projected Follow-up: PT-Home health PT, 24 hour supervision/assistance, OT-  Home health OT, 24 hour supervision/assistance, SLP-  Projected Equipment Needs: PT-Wheelchair (measurements), Wheelchair cushion (measurements), To be determined, Sliding board, OT- 3 in 1 bedside comode, Tub/shower bench, SLP-  Equipment Details: PT-will continue to assess durable medical equipment needs as paitnet progresses , OT-  Patient/family involved in discharge planning: PT- Patient,  OT-Patient, SLP-   MD ELOS: 11-16d Medical Rehab Prognosis:  Fair Assessment: 74 y.o. right handed female with history of hypertension as well as diabetes mellitus. Independent prior to admission living with her daughter and grandchildren. Presented 12/16/2014 with nonspecific abdominal pain as well as nausea vomiting. He also had some recent weight loss. CT abdomen revealed a mass in the splenic flexure as well as nodule left adrenal gland. Abdominal pain progressed follow-up scan revealed some free fluid and air. Underwent exploratory laparotomy right transverse and  partial left colectomy with en block partial gastrectomy, and  ileostomy 12/17/2014 per Dr. Grandville Silos. Pathology report adenocarcinoma with metastases and await plan of care to be established as outpatient with oncology services Dr Burr Medico. Patient required ventilatory management followed by critical care postoperatively. Wound care nurse consulted for sacral decubitus with dressing change recommendations. She was extubated 12/21/2014 with slow progress. Palliative care consulted 12/27/2014. 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 Bridgett Larsson. Acute blood loss anemia 7.2-9.6 and monitored   Now requiring 24/7 Rehab RN,MD, as well as CIR level PT, OT   Treatment team will focus on ADLs and mobility with goals set at sup/minA  See Team Conference Notes for weekly updates to the plan of care

## 2015-01-04 NOTE — Progress Notes (Signed)
Subjective/Complaints: O2 is off, pt breathing comfortably in Room Discussed wound care with nursing Patient without any severe pains Discussed discharge date with patient Diuresis noted per patient ROS  Negative except for midline abdominal pain, no phantom pain   Objective: Vital Signs: Blood pressure 145/63, pulse 89, temperature 98.5 F (36.9 C), temperature source Oral, resp. rate 18, height _0  (1.803 m), weight 82.2 kg (181 lb 3.5 oz), SpO2 95 %. Dg Chest 2 View  01/02/2015   CLINICAL DATA:  Shortness of Breath  EXAM: CHEST  2 VIEW  COMPARISON:  12/21/2014  FINDINGS: Cardiac shadow is enlarged. Bilateral pleural effusions are noted which have increased in the interval from the prior exam. The left effusion is larger than the right. Mild vascular congestion is seen. No acute bony abnormality is noted.  IMPRESSION: Increasing bilateral pleural effusions left greater than right.   Electronically Signed   By: [name] Catalina M.D.   On: 01/02/2015 16:05   Results for orders placed or performed during the hospital encounter of 01/01/15 (from the past 72 hour(s))  CBC     Status: Abnormal   Collection Time: 01/01/15  3:37 PM  Result Value Ref Range   WBC 14.9 (H) 4.0 - 10.5 K/uL   RBC 2.57 (L) 3.87 - 5.11 MIL/uL   Hemoglobin 6.9 (LL) 12.0 - 15.0 g/dL    Comment: REPEATED TO VERIFY CRITICAL RESULT CALLED TO, READ BACK BY AND VERIFIED WITH: L ROSERO,RN 1703 01/01/15 D BRADLEY    HCT 21.2 (L) 36.0 - 46.0 %   MCV 82.5 78.0 - 100.0 fL   MCH 26.8 26.0 - 34.0 pg   MCHC 32.5 30.0 - 36.0 g/dL   RDW 18.1 (H) 11.5 - 15.5 %   Platelets 458 (H) 150 - 400 K/uL  Creatinine, serum     Status: Abnormal   Collection Time: 01/01/15  3:37 PM  Result Value Ref Range   Creatinine, Ser 1.02 (H) 0.44 - 1.00 mg/dL   GFR calc non Af Amer 53 (L) >60 mL/min   GFR calc Af Amer >60 >60 mL/min    Comment: (NOTE) The eGFR has been calculated using the CKD EPI equation. This calculation has not been  validated in all clinical situations. eGFR's persistently <60 mL/min signify possible Chronic Kidney Disease.   Glucose, capillary     Status: Abnormal   Collection Time: 01/01/15  5:19 PM  Result Value Ref Range   Glucose-Capillary 110 (H) 65 - 99 mg/dL   Comment 1 Notify RN   CBC     Status: Abnormal   Collection Time: 01/01/15  6:32 PM  Result Value Ref Range   WBC 12.4 (H) 4.0 - 10.5 K/uL   RBC 3.30 (L) 3.87 - 5.11 MIL/uL   Hemoglobin 8.8 (L) 12.0 - 15.0 g/dL    Comment: REPEATED TO VERIFY   HCT 27.8 (L) 36.0 - 46.0 %   MCV 84.2 78.0 - 100.0 fL   MCH 26.7 26.0 - 34.0 pg   MCHC 31.7 30.0 - 36.0 g/dL   RDW 18.1 (H) 11.5 - 15.5 %   Platelets 359 150 - 400 K/uL  Glucose, capillary     Status: Abnormal   Collection Time: 01/01/15  9:05 PM  Result Value Ref Range   Glucose-Capillary 111 (H) 65 - 99 mg/dL  Glucose, capillary     Status: None   Collection Time: 01/02/15  2:26 AM  Result Value Ref Range   Glucose-Capillary 92 65 - 99  mg/dL  Glucose, capillary     Status: None   Collection Time: 01/02/15  4:45 AM  Result Value Ref Range   Glucose-Capillary 99 65 - 99 mg/dL  CBC WITH DIFFERENTIAL     Status: Abnormal   Collection Time: 01/02/15  5:39 AM  Result Value Ref Range   WBC 11.4 (H) 4.0 - 10.5 K/uL   RBC 3.11 (L) 3.87 - 5.11 MIL/uL   Hemoglobin 8.5 (L) 12.0 - 15.0 g/dL   HCT 26.6 (L) 36.0 - 46.0 %   MCV 85.5 78.0 - 100.0 fL   MCH 27.3 26.0 - 34.0 pg   MCHC 32.0 30.0 - 36.0 g/dL   RDW 18.0 (H) 11.5 - 15.5 %   Platelets 326 150 - 400 K/uL   Neutrophils Relative % 84 (H) 43 - 77 %   Neutro Abs 9.5 (H) 1.7 - 7.7 K/uL   Lymphocytes Relative 8 (L) 12 - 46 %   Lymphs Abs 0.9 0.7 - 4.0 K/uL   Monocytes Relative 8 3 - 12 %   Monocytes Absolute 0.9 0.1 - 1.0 K/uL   Eosinophils Relative 0 0 - 5 %   Eosinophils Absolute 0.1 0.0 - 0.7 K/uL   Basophils Relative 0 0 - 1 %   Basophils Absolute 0.0 0.0 - 0.1 K/uL  Comprehensive metabolic panel     Status: Abnormal    Collection Time: 01/02/15  5:39 AM  Result Value Ref Range   Sodium 138 135 - 145 mmol/L   Potassium 3.4 (L) 3.5 - 5.1 mmol/L   Chloride 103 101 - 111 mmol/L   CO2 28 22 - 32 mmol/L   Glucose, Bld 98 65 - 99 mg/dL   BUN 8 6 - 20 mg/dL   Creatinine, Ser 0.88 0.44 - 1.00 mg/dL   Calcium 7.4 (L) 8.9 - 10.3 mg/dL   Total Protein 4.7 (L) 6.5 - 8.1 g/dL   Albumin <1.0 (L) 3.5 - 5.0 g/dL   AST 20 15 - 41 U/L   ALT 13 (L) 14 - 54 U/L   Alkaline Phosphatase 112 38 - 126 U/L   Total Bilirubin 0.3 0.3 - 1.2 mg/dL   GFR calc non Af Amer >60 >60 mL/min   GFR calc Af Amer >60 >60 mL/min    Comment: (NOTE) The eGFR has been calculated using the CKD EPI equation. This calculation has not been validated in all clinical situations. eGFR's persistently <60 mL/min signify possible Chronic Kidney Disease.    Anion gap 7 5 - 15  Glucose, capillary     Status: Abnormal   Collection Time: 01/02/15 10:06 AM  Result Value Ref Range   Glucose-Capillary 118 (H) 65 - 99 mg/dL  Glucose, capillary     Status: Abnormal   Collection Time: 01/02/15 12:07 PM  Result Value Ref Range   Glucose-Capillary 121 (H) 65 - 99 mg/dL  Glucose, capillary     Status: Abnormal   Collection Time: 01/02/15  4:34 PM  Result Value Ref Range   Glucose-Capillary 132 (H) 65 - 99 mg/dL  Glucose, capillary     Status: Abnormal   Collection Time: 01/02/15  8:35 PM  Result Value Ref Range   Glucose-Capillary 114 (H) 65 - 99 mg/dL  Glucose, capillary     Status: None   Collection Time: 01/03/15 12:53 AM  Result Value Ref Range   Glucose-Capillary 98 65 - 99 mg/dL  Glucose, capillary     Status: None   Collection Time: 01/03/15  4:06  AM  Result Value Ref Range   Glucose-Capillary 92 65 - 99 mg/dL  Glucose, capillary     Status: Abnormal   Collection Time: 01/03/15  8:27 AM  Result Value Ref Range   Glucose-Capillary 113 (H) 65 - 99 mg/dL  Glucose, capillary     Status: Abnormal   Collection Time: 01/03/15 11:59 AM  Result  Value Ref Range   Glucose-Capillary 141 (H) 65 - 99 mg/dL  Glucose, capillary     Status: Abnormal   Collection Time: 01/03/15  4:59 PM  Result Value Ref Range   Glucose-Capillary 132 (H) 65 - 99 mg/dL  Glucose, capillary     Status: Abnormal   Collection Time: 01/03/15  8:55 PM  Result Value Ref Range   Glucose-Capillary 103 (H) 65 - 99 mg/dL  CBC     Status: Abnormal   Collection Time: 01/04/15  6:58 AM  Result Value Ref Range   WBC 8.9 4.0 - 10.5 K/uL   RBC 3.26 (L) 3.87 - 5.11 MIL/uL   Hemoglobin 8.5 (L) 12.0 - 15.0 g/dL   HCT 27.7 (L) 36.0 - 46.0 %   MCV 85.0 78.0 - 100.0 fL   MCH 26.1 26.0 - 34.0 pg   MCHC 30.7 30.0 - 36.0 g/dL   RDW 17.8 (H) 11.5 - 15.5 %   Platelets 345 150 - 400 K/uL  Glucose, capillary     Status: None   Collection Time: 01/04/15  7:02 AM  Result Value Ref Range   Glucose-Capillary 91 65 - 99 mg/dL     HEENT: poor dentition Cardio: RRR and tachy Resp: CTA B/L and unlabored GI: BS positive and midline incision, good granulation tissue,, wet to dry dressingsRLQ colostomy Extremity:  Edema Right stump Skin:   Wound C/D/I Neuro: Alert/Oriented, Anxious and Abnormal Motor 4/5 bilateral deltoids, biceps, triceps, grip, 4/5 right hip flexor, 4/5 left hip flexor knee extensor ankle dorsiflexor Musc/Skel:  Other right BKA, healing well, staples intact Gen. no acute distress   Assessment/Plan: 1. 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 Physiatrist is providing close team supervision and 24 hour management of active medical problems listed below. Physiatrist and rehab team continue to assess barriers to discharge/monitor patient progress toward functional and medical goals. We'll need 24 7 supervision post discharge, plan discharge 59 with family assistance FIM: FIM -  Bathing Bathing Steps Patient Completed: Chest, Right Arm, Left Arm, Abdomen, Right upper leg, Left upper leg, Front perineal area Bathing: 3: Mod-Patient completes 5-7 [id number] parts or 50-74%  FIM - Upper Body Dressing/Undressing Upper body dressing/undressing steps patient completed: Thread/unthread right sleeve of pullover shirt/dresss, Thread/unthread left sleeve of pullover shirt/dress, Put head through opening of pull over shirt/dress, Pull shirt over trunk Upper body dressing/undressing: 5: Set-up assist to: Obtain clothing/put away FIM - Lower Body Dressing/Undressing Lower body dressing/undressing: 1: Two helpers  FIM - Toileting Toileting: 1: Two helpers  FIM - TRadio producerDevices: BEngineer, civil (consulting) SEngineer, miningTransfers: 1-Two helpers  FIM - BControl and instrumentation engineerDevices: Bed rails, Sliding board Bed/Chair Transfer: 3: Supine > Sit: Mod A (lifting assist/Pt. 50-74%/lift 2 legs, 2: Bed > Chair or W/C: Max A (lift and lower assist)  FIM - Locomotion: Wheelchair Distance: 40 feet Locomotion: Wheelchair: 1: Travels less than 50 ft with minimal assistance (Pt.>75%) FIM - Locomotion:  Ambulation Ambulation/Gait Assistance: Not tested (comment) (Unsafe to attempt at this time secondary to total assist for static standing balance 30 seconds ) Locomotion: Ambulation: 0: Activity did not occur (secondary to safety and total assist to stand 15 seconds)  Comprehension Comprehension Mode: Auditory Comprehension: 5-Follows basic conversation/direction: With no assist  Expression Expression Mode: Verbal Expression: 5-Expresses basic needs/ideas: With extra time/assistive device  Social Interaction Social Interaction: 5-Interacts appropriately 90% of the time - Needs monitoring or encouragement for participation or interaction.  Problem Solving Problem Solving: 5-Solves basic 90% of the time/requires cueing < 10% of the  time  Memory Memory: 4-Recognizes or recalls 75 - 89% of the time/requires cueing 10 - 24% of the time  Medical Problem List and Plan: 1. 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  DVT Prophylaxis/Anticoagulation: Subcutaneous Lovenox. Monitor platelet counts and any signs of bleeding 3. Pain Management: Neurontin 100 mg 3 times a day, Oxycodone. Monitor with increased mobility 4. Mood/depression: Prozac 20 mg daily. 5. Neuropsych: This patient is capable of making decisions on her own behalf. 6. Skin/Wound Care: Local care to right BKA site as well as abdominal wound/ostomy and deep tissue injury to buttocks and sacrum.  WOC RN following for wound care. Has had some issues with seal of ostomy 7. Fluids/Electrolytes/Nutrition: Routine I nose with follow-up chemistries, prob fluid overload will gently diurese 8. Acute blood loss anemia. Follow-up CBC tonight 9. Diabetes mellitus with peripheral neuropathy. Hemoglobin A1c 6.3. Sliding scale insulin. Check blood sugars before meals and at bedtime 10. Hypertension. Norvasc 5 mg daily. 11.  Hypoxia improved, recheck chest x-ray shows pleural effusion, continue prn supplemental O2, afebrile, lung exam with reduced BS, will diurese 12.  HypoK mild will supplement and watch during diuresis LOS (Days) 3 A FACE TO FACE EVALUATION WAS PERFORMED  KIRSTEINS,ANDREW E 01/04/2015, 8:46 AM

## 2015-01-04 NOTE — Progress Notes (Signed)
ANTICOAGULATION CONSULT NOTE - Follow Up Consult  Pharmacy Consult for Enoxaparin Indication: atrial fibrillation and DVT  No Known Allergies  Patient Measurements: Height: 5\' 11"  (180.3 cm) Weight: 181 lb 3.5 oz (82.2 kg) IBW/kg (Calculated) : 70.8 Heparin Dosing Weight:   Vital Signs: Temp: 98.5 F (36.9 C) (05/19 0459) Temp Source: Oral (05/19 0459) BP: 145/63 mmHg (05/19 0838) Pulse Rate: 89 (05/19 0838)  Labs:  Recent Labs  01/01/15 1537 01/01/15 1832 01/02/15 0539 01/04/15 0658  HGB 6.9* 8.8* 8.5* 8.5*  HCT 21.2* 27.8* 26.6* 27.7*  PLT 458* 359 326 345  CREATININE 1.02*  --  0.88  --     Estimated Creatinine Clearance: 63.6 mL/min (by C-G formula based on Cr of 0.88).   Medications:  Scheduled:  . amLODipine  5 mg Oral Daily  . docusate sodium  100 mg Oral Daily  . enoxaparin (LOVENOX) injection  130 mg Subcutaneous Q24H  . feeding supplement (ENSURE ENLIVE)  237 mL Oral BID BM  . FLUoxetine  20 mg Oral Daily  . furosemide  20 mg Oral Daily  . gabapentin  100 mg Oral TID  . insulin aspart  0-9 Units Subcutaneous TID WC & HS  . pantoprazole  40 mg Oral Daily  . potassium chloride  20 mEq Oral BID    Assessment: 74yo female with recent DVT and hx AFib, on indefinite anticoagulation 2nd colon cancer.  Hg 8.5- stable and pltc wnl.  No bleeding noted.  Goal of Therapy:  Anti-Xa level 0.6-1 units/ml 4hrs after LMWH dose given Monitor platelets by anticoagulation protocol: Yes   Plan:  Continue Lovenox 1.5mg /kg SQ q24 Watch for s/s of bleeding Monitor H/H  Gracy Bruins, PharmD Clinical Pharmacist [facility]

## 2015-01-05 ENCOUNTER — Inpatient Hospital Stay (HOSPITAL_COMMUNITY): Payer: Medicare Other | Admitting: Occupational Therapy

## 2015-01-05 ENCOUNTER — Inpatient Hospital Stay (HOSPITAL_COMMUNITY): Payer: Medicare Other | Admitting: Physical Therapy

## 2015-01-05 ENCOUNTER — Telehealth: Payer: Self-pay | Admitting: *Deleted

## 2015-01-05 DIAGNOSIS — I480 Paroxysmal atrial fibrillation: Secondary | ICD-10-CM

## 2015-01-05 LAB — BASIC METABOLIC PANEL
Anion gap: 7 (ref 5–15)
BUN: 7 mg/dL (ref 6–20)
CHLORIDE: 101 mmol/L (ref 101–111)
CO2: 31 mmol/L (ref 22–32)
Calcium: 7.7 mg/dL — ABNORMAL LOW (ref 8.9–10.3)
Creatinine, Ser: 0.87 mg/dL (ref 0.44–1.00)
GFR calc non Af Amer: >60 mL/min (ref >60)
Glucose, Bld: 121 mg/dL — ABNORMAL HIGH (ref 65–99)
POTASSIUM: 4.5 mmol/L (ref 3.5–5.1)
Sodium: 139 mmol/L (ref 135–145)

## 2015-01-05 LAB — GLUCOSE, CAPILLARY
GLUCOSE-CAPILLARY: 114 mg/dL — AB (ref 65–99)
GLUCOSE-CAPILLARY: 111 mg/dL — AB (ref 65–99)
Glucose-Capillary: 80 mg/dL (ref 65–99)
Glucose-Capillary: 112 mg/dL — ABNORMAL HIGH (ref 65–99)

## 2015-01-05 MED ORDER — ENOXAPARIN SODIUM 120 MG/0.8ML ~~LOC~~ SOLN
120.0000 mg | SUBCUTANEOUS | Status: DC
  Administered 2015-01-05 – 2015-01-18 (×14): 120 mg via SUBCUTANEOUS
  Filled 2015-01-05 (×15): qty 0.8

## 2015-01-05 NOTE — Progress Notes (Signed)
Subjective/Complaints: No SOB, no leg pain or abd pain this am ROS  Negative except for midline abdominal pain, no phantom pain   Objective: Vital Signs: Blood pressure 145/55, pulse 91, temperature 97.7 F (36.5 C), temperature source Oral, resp. rate 18, height 5\' 11"  (1.803 m), weight 82.2 kg (181 lb 3.5 oz), SpO2 95 %. No results found. Results for orders placed or performed during the hospital encounter of 01/01/15 (from the past 72 hour(s))  Glucose, capillary     Status: Abnormal   Collection Time: 01/02/15 10:06 AM  Result Value Ref Range   Glucose-Capillary 118 (H) 65 - 99 mg/dL  Glucose, capillary     Status: Abnormal   Collection Time: 01/02/15 12:07 PM  Result Value Ref Range   Glucose-Capillary 121 (H) 65 - 99 mg/dL  Glucose, capillary     Status: Abnormal   Collection Time: 01/02/15  4:34 PM  Result Value Ref Range   Glucose-Capillary 132 (H) 65 - 99 mg/dL  Glucose, capillary     Status: Abnormal   Collection Time: 01/02/15  8:35 PM  Result Value Ref Range   Glucose-Capillary 114 (H) 65 - 99 mg/dL  Glucose, capillary     Status: None   Collection Time: 01/03/15 12:53 AM  Result Value Ref Range   Glucose-Capillary 98 65 - 99 mg/dL  Glucose, capillary     Status: None   Collection Time: 01/03/15  4:06 AM  Result Value Ref Range   Glucose-Capillary 92 65 - 99 mg/dL  Glucose, capillary     Status: Abnormal   Collection Time: 01/03/15  8:27 AM  Result Value Ref Range   Glucose-Capillary 113 (H) 65 - 99 mg/dL  Glucose, capillary     Status: Abnormal   Collection Time: 01/03/15 11:59 AM  Result Value Ref Range   Glucose-Capillary 141 (H) 65 - 99 mg/dL  Glucose, capillary     Status: Abnormal   Collection Time: 01/03/15  4:59 PM  Result Value Ref Range   Glucose-Capillary 132 (H) 65 - 99 mg/dL  Glucose, capillary     Status: Abnormal   Collection Time: 01/03/15  8:55 PM  Result Value Ref Range   Glucose-Capillary 103 (H) 65 - 99 mg/dL  CBC     Status:  Abnormal   Collection Time: 01/04/15  6:58 AM  Result Value Ref Range   WBC 8.9 4.0 - 10.5 K/uL   RBC 3.26 (L) 3.87 - 5.11 MIL/uL   Hemoglobin 8.5 (L) 12.0 - 15.0 g/dL   HCT 27.7 (L) 36.0 - 46.0 %   MCV 85.0 78.0 - 100.0 fL   MCH 26.1 26.0 - 34.0 pg   MCHC 30.7 30.0 - 36.0 g/dL   RDW 17.8 (H) 11.5 - 15.5 %   Platelets 345 150 - 400 K/uL  Glucose, capillary     Status: None   Collection Time: 01/04/15  7:02 AM  Result Value Ref Range   Glucose-Capillary 91 65 - 99 mg/dL  Glucose, capillary     Status: None   Collection Time: 01/04/15 11:30 AM  Result Value Ref Range   Glucose-Capillary 94 65 - 99 mg/dL  Glucose, capillary     Status: None   Collection Time: 01/04/15  4:56 PM  Result Value Ref Range   Glucose-Capillary 91 65 - 99 mg/dL  Glucose, capillary     Status: Abnormal   Collection Time: 01/04/15  9:19 PM  Result Value Ref Range   Glucose-Capillary 107 (H) 65 -  99 mg/dL   Comment 1 Notify RN   Glucose, capillary     Status: None   Collection Time: 01/05/15  6:50 AM  Result Value Ref Range   Glucose-Capillary 80 65 - 99 mg/dL     HEENT: poor dentition Cardio: RRR and tachy Resp: CTA B/L and unlabored GI: BS positive and midline incision, good granulation tissue,, wet to dry dressingsRLQ colostomy Extremity:  Edema Right stump Skin:   Wound C/D/I Neuro: Alert/Oriented, Anxious and Abnormal Motor 4/5 bilateral deltoids, biceps, triceps, grip, 4/5 right hip flexor, 4/5 left hip flexor knee extensor ankle dorsiflexor Musc/Skel:  Other right BKA, healing well, staples intact Gen. no acute distress   Assessment/Plan: 1. 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 Physiatrist is providing close team supervision and 24 hour management of active medical problems listed below. Physiatrist and rehab team  continue to assess barriers to discharge/monitor patient progress toward functional and medical goals.  FIM: FIM - Bathing Bathing Steps Patient Completed: Chest, Right Arm, Left Arm, Abdomen, Right upper leg, Left upper leg, Front perineal area Bathing: 3: Mod-Patient completes 5-7 52f 10 parts or 50-74%  FIM - Upper Body Dressing/Undressing Upper body dressing/undressing steps patient completed: Thread/unthread right sleeve of pullover shirt/dresss, Thread/unthread left sleeve of pullover shirt/dress, Put head through opening of pull over shirt/dress, Pull shirt over trunk Upper body dressing/undressing: 5: Set-up assist to: Obtain clothing/put away FIM - Lower Body Dressing/Undressing Lower body dressing/undressing: 1: Total-Patient completed less than 25% of tasks (bed level)  FIM - Toileting Toileting: 1: Two helpers  FIM - Radio producer Devices: Bedside commode, Sliding board Toilet Transfers: 1-Two helpers  FIM - Control and instrumentation engineer Devices: Sliding board Bed/Chair Transfer: 3: Sit > Supine: Mod A (lifting assist/Pt. 50-74%/lift 2 legs)  FIM - Locomotion: Wheelchair Distance: 65 feet  Locomotion: Wheelchair: 2: Travels 50 - 149 ft with minimal assistance (Pt.>75%) FIM - Locomotion: Ambulation Ambulation/Gait Assistance: Not tested (comment) (Unsafe to attempt at this time secondary to total assist for static standing balance 30 seconds ) Locomotion: Ambulation: 0: Activity did not occur (secondary to safety and total assist to stand 15 seconds)  Comprehension Comprehension Mode: Auditory Comprehension: 5-Follows basic conversation/direction: With no assist  Expression Expression Mode: Verbal Expression: 5-Expresses basic needs/ideas: With no assist  Social Interaction Social Interaction: 5-Interacts appropriately 90% of the time - Needs monitoring or encouragement for participation or interaction.  Problem  Solving Problem Solving: 5-Solves basic 90% of the time/requires cueing < 10% of the time  Memory Memory: 4-Recognizes or recalls 75 - 89% of the time/requires cueing 10 - 24% of the time  Medical Problem List and Plan: 1. 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  DVT Prophylaxis/Anticoagulation: Subcutaneous Lovenox. Monitor platelet counts and any signs of bleeding,discussed with hematology Dr Darnell Level who recommends full dose anticoag with lovenox without conversion to warfarin given active metastatic CA, for at least 6 mo, given wt loss (?BKA) will reduce dose to 120mg  q24h                3. Pain Management: Neurontin 100 mg 3 times a day, Oxycodone. Monitor with increased mobility 4. Mood/depression: Prozac 20 mg daily. 5. Neuropsych: This  patient is capable of making decisions on her own behalf. 6. Skin/Wound Care: Local care to right BKA site as well as abdominal wound/ostomy and deep tissue injury to buttocks and sacrum.  WOC RN following for wound care. Has had some issues with seal of ostomy 7. Fluids/Electrolytes/Nutrition: Routine I nose with follow-up chemistries, prob fluid overload will gently diurese 8. Acute blood loss anemia. Follow-up CBC tonight 9. Diabetes mellitus with peripheral neuropathy. Hemoglobin A1c 6.3. Sliding scale insulin. Check blood sugars before meals and at bedtime 10. Hypertension. Norvasc 5 mg daily. 11.  Hypoxia improved, recheck chest x-ray shows pleural effusion, continue prn supplemental O2, afebrile, lung exam with reduced BS, will diurese 12.  HypoK mild will supplement and watch during diuresis, check BMET LOS (Days) [place] E 01/05/2015, 8:18 AM

## 2015-01-05 NOTE — Telephone Encounter (Signed)
Kathryn Knapp,  Please schedule her to see me some time in the next two weeks. I saw her in house, 30 mins for follow up. Diagnosis: metastatic colon cancer  [name], this is the same pt I just sent a message to you, you don't have to call her then.  Truitt Merle  01/05/2015

## 2015-01-05 NOTE — Progress Notes (Signed)
Physical Therapy Session Note  Patient Details  Name: Kathryn Knapp MRN: [id number] Date of Birth: 18-Oct-1940  Today's Date: 01/05/2015 PT Individual Time: 0900-1000 PT Individual Time Calculation (min): 60 min   Short Term Goals: Week 1:  PT Short Term Goal 1 (Week 1): Patient will perform bed mobility short sit to and from supine max assist PT Short Term Goal 2 (Week 1): Patient will perfrom slide board transfer mod assist to a level surface. PT Short Term Goal 3 (Week 1): Patient will  stand with max assist and least restrictive asssistive device for one minute PT Short Term Goal 4 (Week 1): Patient will propel wheelchair 50 feet over level surfaces with stand by assistance. PT Short Term Goal 5 (Week 1): Patient will be able to educate on residual limb care with no more than minimal assistance for insepection and residual limb wrapping.   Skilled Therapeutic Interventions/Progress Updates:   Session focused on bed mobility, transfer training with patient directing care, pressure relief, and activity tolerance. Patient received in bed, requesting to get off bed pan. Patient performed rolling to R and L using rail with S-min A and assisted with hygiene. Patient requested to empty colostomy bag before getting OOB, RN notified. Supine therex: L ankle pumps, glute sets and R/L quad sets with 5 sec hold x 15-20 each exercise, patient verbalized she could perform exercises throughout day. Patient transferred supine > sit with use of rail and mod A overall, min verbal cues for sequencing. Patient performed slide board transfers bed > wheelchair and wheelchair <> mat table with mod-max A overall and +2 assist to stabilize wheelchair. Patient required mod > min verbal cues for head hips relationship and forward lean for improved hip clearance and decreased shearing. Patient propelled wheelchair using BUE x 150 ft with 3-4 rest breaks due to fatigue and SOB. Patient performed wheelchair pushups x 5 but  demonstrated inadequate pressure relief, therefore instructed in forward/lateral leans using arm chair and mat for support. Patient c/o L chest and ribcage pain with movement with increase in anxiety regarding pain throughout session, instructed in pursed lip breathing and relaxation techniques, RN and OT notified. Patient left sitting in wheelchair to return to room with rehab tech for OT session.   Therapy Documentation Precautions:  Precautions Precautions: Fall Precaution Comments: non weight bearing right lower extremity  Restrictions Weight Bearing Restrictions: Yes RLE Weight Bearing: Non weight bearing Vital Signs: Therapy Vitals Temp: 97.7 F (36.5 C) Temp Source: Oral Pulse Rate: (!) 104 Resp: 18 BP: (!) 142/63 mmHg Patient Position (if appropriate): Sitting Oxygen Therapy SpO2: 90 % O2 Device: Not Delivered Pain: Pain Assessment Pain Assessment: Faces Faces Pain Scale: Hurts even more Pain Type: Acute pain Pain Location: Chest Pain Orientation: Left;Anterior;Upper Pain Descriptors / Indicators:  (pulling) Pain Onset: With Activity Pain Intervention(s): RN made aware;Repositioned;Rest  See FIM for current functional status  Therapy/Group: Individual Therapy  [name] 01/05/2015, 10:11 AM

## 2015-01-05 NOTE — Plan of Care (Signed)
Problem: RH PAIN MANAGEMENT Goal: RH STG PAIN MANAGED AT OR BELOW PT'S PAIN GOAL Less than 3,on scale 1 to 10.  Outcome: Not Progressing Rates pain 8/10

## 2015-01-05 NOTE — Progress Notes (Signed)
Occupational Therapy Session Note  Patient Details  Name: Kathryn Knapp MRN: [id number] Date of Birth: Sep 18, 1940  Today's Date: 01/05/2015 OT Individual Time: 1000-1100 and 1300-1430 OT Individual Time Calculation (min): 60 min and 90 min   Short Term Goals: Week 1:  OT Short Term Goal 1 (Week 1): Pt will complete toilet transfer to drop arm BSC with max assist of one caregiver OT Short Term Goal 2 (Week 1): Pt will complete LB dressing with max assist of one caregiver OT Short Term Goal 3 (Week 1): Pt will complete bathing with min assist OT Short Term Goal 4 (Week 1): Pt will complete toileting with max assist of one caregiver  Skilled Therapeutic Interventions/Progress Updates:    1) Engaged in ADL retraining with focus on transfers, lateral leans, and use of AE to complete self-care tasks.  Pt in w/c having just returned from PT session reporting pain in Lt ribcage/chest with movement.  Completed bathing and dressing tasks at sink.  Pt reports needing to toilet, completed slide board transfer w/c > drop arm BSC with max assist with 2nd person present for safety.  Lateral leans to complete clothing management with pt requiring assist from therapist for clothing while pt utilized BUE to complete leans.  Completed LB bathing and dressing from [facility] with pt demonstrating use of reacher and sockaid.  Pt transferred back to bed at end of session with slide board and mod-max assist with increased carryover of head hip relationship.  2) Engaged in therapeutic activity with focus on activity tolerance, bed mobility, lateral leans, and dynamic sitting balance.  Pt in bed upon arrival, reports urgently needing to void therefore assisted pt with bedpan.  Slide board transfer OOB with max assist, mod assist to/from therapy mat, and then min assist from w/c to bed at end of session.  Completed anterior and posterior scooting on mat and lateral scoots with focus on dissociation of hips and trunk as well as BUE  use with lateral leans to increase lifting with slide board transfers.  Forward reaching to complete ring toss with pt reporting some mild discomfort in abdomen but decreased from previous sessions.  Completed UE arm bike for 10 mins with 5 mins forward and 5 backwards and pt taking a rest break every 1-2 mins.  Pt returned to bed at end of session with slide board requiring only setup and min-mod assist for steadying but no lifting.  Therapy Documentation Precautions:  Precautions Precautions: Fall Precaution Comments: non weight bearing right lower extremity  Restrictions Weight Bearing Restrictions: Yes RLE Weight Bearing: Non weight bearing General:   Vital Signs: Therapy Vitals Pulse Rate: (!) 104 BP: (!) 142/63 mmHg Patient Position (if appropriate): Sitting Oxygen Therapy SpO2: 90 % O2 Device: Not Delivered Pain: Pain Assessment Pain Assessment: Faces Faces Pain Scale: Hurts even more Pain Type: Acute pain Pain Location: Chest Pain Orientation: Left;Anterior;Upper Pain Descriptors / Indicators:  (pulling) Pain Onset: With Activity Pain Intervention(s): RN made aware;Repositioned;Rest  See FIM for current functional status  Therapy/Group: Individual Therapy  Simonne Come 01/05/2015, 12:18 PM

## 2015-01-05 NOTE — Telephone Encounter (Signed)
VM message from daughter, Laurina Bustle, asking when can her mother start chemotherapy. Pt. is currently inpatient @ [facility] in rm # [phone]  Call back # for daughter is  [phone]

## 2015-01-06 ENCOUNTER — Inpatient Hospital Stay (HOSPITAL_COMMUNITY): Payer: Medicare Other | Admitting: *Deleted

## 2015-01-06 LAB — GLUCOSE, CAPILLARY
GLUCOSE-CAPILLARY: 111 mg/dL — AB (ref 65–99)
GLUCOSE-CAPILLARY: 111 mg/dL — AB (ref 65–99)
Glucose-Capillary: 120 mg/dL — ABNORMAL HIGH (ref 65–99)
Glucose-Capillary: 100 mg/dL — ABNORMAL HIGH (ref 65–99)

## 2015-01-06 NOTE — Progress Notes (Signed)
Patient ID: Kathryn Knapp, female   DOB: 11-Jan-1941, 74 y.o.   MRN: [id number]    01/06/15.  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 Hospital course complicated by sepsis syndrome with acute respiratory failure and acute renal failure syndrome and acute blood loss anemia.  Past Medical History  Diagnosis Date  . Diabetes mellitus without complication   . Hypertension      Subjective/Complaints: No SOB, no leg pain or abd pain this am ROS  Negative except for midline abdominal pain, no phantom pain   Objective: Vital Signs: Blood pressure 135/55, pulse 82, temperature 97.8 F (36.6 C), temperature source Oral, resp. rate 19, height _0  (1.803 m), weight 181 lb 3.5 oz (82.2 kg), SpO2 96 %. No results found. Results for orders placed or performed during the hospital encounter of 01/01/15 (from the past 72 hour(s))  Glucose, capillary     Status: Abnormal   Collection Time: 01/03/15 11:59 AM  Result Value Ref Range   Glucose-Capillary 141 (H) 65 - 99 mg/dL  Glucose, capillary     Status: Abnormal   Collection Time: 01/03/15  4:59 PM  Result Value Ref Range   Glucose-Capillary 132 (H) 65 - 99 mg/dL  Glucose, capillary     Status: Abnormal   Collection Time: 01/03/15  8:55 PM  Result Value Ref Range   Glucose-Capillary 103 (H) 65 - 99 mg/dL  CBC     Status: Abnormal   Collection Time: 01/04/15  6:58 AM  Result Value Ref Range   WBC 8.9 4.0 - 10.5 K/uL   RBC 3.26 (L) 3.87 - 5.11 MIL/uL   Hemoglobin 8.5 (L) 12.0 - 15.0 g/dL   HCT 27.7 (L) 36.0 - 46.0 %   MCV 85.0 78.0 - 100.0 fL   MCH 26.1 26.0 - 34.0 pg   MCHC 30.7 30.0 - 36.0 g/dL   RDW 17.8 (H) 11.5 - 15.5 %   Platelets 345 150 - 400 K/uL  Glucose, capillary     Status: None   Collection Time:  01/04/15  7:02 AM  Result Value Ref Range   Glucose-Capillary 91 65 - 99 mg/dL  Glucose, capillary     Status: None   Collection Time: 01/04/15 11:30 AM  Result Value Ref Range   Glucose-Capillary 94 65 - 99 mg/dL  Glucose, capillary     Status: None   Collection Time: 01/04/15  4:56 PM  Result Value Ref Range   Glucose-Capillary 91 65 - 99 mg/dL  Glucose, capillary     Status: Abnormal   Collection Time: 01/04/15  9:19 PM  Result Value Ref Range   Glucose-Capillary 107 (H) 65 - 99 mg/dL   Comment 1 Notify RN   Glucose, capillary     Status: None   Collection Time: 01/05/15  6:50 AM  Result Value Ref Range   Glucose-Capillary 80 65 - 99 mg/dL  Basic metabolic panel     Status: Abnormal   Collection Time: 01/05/15 11:52 AM  Result Value Ref Range   Sodium 139 135 - 145 mmol/L   Potassium 4.5 3.5 - 5.1 mmol/L   Chloride 101 101 - 111 mmol/L   CO2 31 22 - 32 mmol/L   Glucose, Bld 121 (H) 65 - 99 mg/dL   BUN 7 6 - 20 mg/dL   Creatinine, Ser  0.87 0.44 - 1.00 mg/dL   Calcium 7.7 (L) 8.9 - 10.3 mg/dL   GFR calc non Af Amer >60 >60 mL/min   GFR calc Af Amer >60 >60 mL/min    Comment: (NOTE) The eGFR has been calculated using the CKD EPI equation. This calculation has not been validated in all clinical situations. eGFR's persistently <60 mL/min signify possible Chronic Kidney Disease.    Anion gap 7 5 - 15  Glucose, capillary     Status: Abnormal   Collection Time: 01/05/15 11:58 AM  Result Value Ref Range   Glucose-Capillary 112 (H) 65 - 99 mg/dL  Glucose, capillary     Status: Abnormal   Collection Time: 01/05/15  5:01 PM  Result Value Ref Range   Glucose-Capillary 114 (H) 65 - 99 mg/dL  Glucose, capillary     Status: Abnormal   Collection Time: 01/05/15  9:18 PM  Result Value Ref Range   Glucose-Capillary 111 (H) 65 - 99 mg/dL  Glucose, capillary     Status: Abnormal   Collection Time: 01/06/15  6:39 AM  Result Value Ref Range   Glucose-Capillary 120 (H) 65 - 99 mg/dL      Patient Vitals for the past 24 hrs:  BP Temp Temp src Pulse Resp SpO2  01/06/15 0603 (!) 135/55 mmHg 97.8 F (36.6 C) Oral 82 19 96 %  01/05/15 1700 (!) 145/60 mmHg 97.5 F (36.4 C) Oral 98 18 97 %  01/05/15 1003 (!) 142/63 mmHg - - (!) 104 - 90 %     Intake/Output Summary (Last 24 hours) at 01/06/15 0849 Last data filed at 01/06/15 0020  Gross per 24 hour  Intake    480 ml  Output   1200 ml  Net   -720 ml     HEENT: poor dentition-dentures in place Cardio: RRR  , grade 2/6 systolic murmur Resp: CTA B/L and unlabored GI: BS positive and midline incision, good granulation tissue,, wet to dry dressingsRLQ colostomy Extremity:   Right stump bandaged Skin:   Wound C/D/I; left-sided SCD Neuro: Alert/Oriented, Musc/Skel:  Other right BKA, healing well Gen. no acute distress   Assessment/Plan: 1. Functional deficits secondary to right BKA in a patient deconditioned with metastatic adenocarcinoma of the colon 2.  DVT Prophylaxis/Anticoagulation: Subcutaneous Lovenox. Monitor platelet counts and any signs of bleeding,discussed with hematology Dr Darnell Level who recommends full dose anticoag with lovenox without conversion to warfarin given active metastatic CA, for at least 6 mo, given wt loss (?BKA) will reduce dose to 124m q24h                 3. Pain Management: Neurontin 100 mg 3 times a day, Oxycodone. Monitor with increased mobility 4. Mood/depression: Prozac 20 mg daily.  5.  Skin/Wound Care: Local care to right BKA site as well as abdominal wound/ostomy and deep tissue injury to buttocks and sacrum.  WOC RN following for wound care. Has had some issues with seal of ostomy  6. Acute blood loss anemia. Follow-up CBC 7.  Diabetes mellitus with peripheral neuropathy. Hemoglobin A1c 6.3. Sliding scale insulin. Check blood sugars before meals and at bedtime 8. Hypertension. Norvasc 5 mg daily.  LOS (Days) 5 A FACE TO FACE EVALUATION WAS PERFORMED  KNyoka Cowden5/21/2016, 8:46 AM

## 2015-01-06 NOTE — Progress Notes (Signed)
Physical Therapy Session Note  Patient Details  Name: Kathryn Knapp MRN: [id number] Date of Birth: 07-09-1941  Today's Date: 01/06/2015 PT Individual Time: 1400-1500 PT Individual Time Calculation (min): 60 min   Short Term Goals: Week 1:  PT Short Term Goal 1 (Week 1): Patient will perform bed mobility short sit to and from supine max assist PT Short Term Goal 2 (Week 1): Patient will perfrom slide board transfer mod assist to a level surface. PT Short Term Goal 3 (Week 1): Patient will  stand with max assist and least restrictive asssistive device for one minute PT Short Term Goal 4 (Week 1): Patient will propel wheelchair 50 feet over level surfaces with stand by assistance. PT Short Term Goal 5 (Week 1): Patient will be able to educate on residual limb care with no more than minimal assistance for insepection and residual limb wrapping.   Skilled Therapeutic Interventions/Progress Updates:    Patient received supine in bed. Session focused on functional transfers and wheelchair mobility skills. Patient performed rolling to B sides with use of bedrail and supervision/verbal cues for sequencing in order for therapist to assist with pulling pants up. Supine>sit with modA for trunk elevation, modA for scooting to EOB in preparation for transfer, modA slide board transfer bed>wheelchair with min cues for head/hips ratio and modA for lifting to clear glutes to decrease shearing.  Wheelchair mobility >150' x2 with B UE and supervision/cues for increased efficiency. Wheelchair obstacle course weaving in and out of cones and continued practice with sharp turns, sequencing with B UEs, turning L and R, etc. Patient with improved problem solving with wheelchair skills. Patient returned to room and left sitting in wheelchair with all needs within reach.  Therapy Documentation Precautions:  Precautions Precautions: Fall Precaution Comments: NWB R LE Restrictions Weight Bearing Restrictions: Yes RLE  Weight Bearing: Non weight bearing Pain: Pain Assessment Pain Assessment: No/denies pain Pain Score: 0-No pain Locomotion : Ambulation Ambulation/Gait Assistance: Not tested (comment) (not safe at this time) Wheelchair Mobility Distance: 150   See FIM for current functional status  Therapy/Group: Individual Therapy  Lillia Abed. Cara Aguino, PT, DPT 01/06/2015, 2:27 PM

## 2015-01-07 ENCOUNTER — Encounter (HOSPITAL_COMMUNITY): Payer: Medicare Other | Admitting: Occupational Therapy

## 2015-01-07 ENCOUNTER — Inpatient Hospital Stay (HOSPITAL_COMMUNITY): Payer: Medicare Other

## 2015-01-07 ENCOUNTER — Inpatient Hospital Stay (HOSPITAL_COMMUNITY): Payer: Medicare Other | Admitting: Occupational Therapy

## 2015-01-07 LAB — GLUCOSE, CAPILLARY
Glucose-Capillary: 95 mg/dL (ref 65–99)
Glucose-Capillary: 100 mg/dL — ABNORMAL HIGH (ref 65–99)
Glucose-Capillary: 108 mg/dL — ABNORMAL HIGH (ref 65–99)
Glucose-Capillary: 108 mg/dL — ABNORMAL HIGH (ref 65–99)

## 2015-01-07 LAB — CBC
HEMATOCRIT: 26.2 % — AB (ref 36.0–46.0)
Hemoglobin: 8.1 g/dL — ABNORMAL LOW (ref 12.0–15.0)
MCH: 26.3 pg (ref 26.0–34.0)
MCHC: 30.9 g/dL (ref 30.0–36.0)
MCV: 85.1 fL (ref 78.0–100.0)
PLATELETS: 466 10*3/uL — AB (ref 150–400)
RBC: 3.08 MIL/uL — AB (ref 3.87–5.11)
RDW: 18 % — ABNORMAL HIGH (ref 11.5–15.5)
WBC: 8.5 10*3/uL (ref 4.0–10.5)

## 2015-01-07 NOTE — Progress Notes (Signed)
Occupational Therapy Session Note  Patient Details  Name: Kathryn Knapp MRN: [id number] Date of Birth: 1941/04/30  Today's Date: 01/07/2015 OT Individual Time: 8119-1478 and 2956-2130 OT Individual Time Calculation (min): 60 min and 40 min   Short Term Goals: Week 1:  OT Short Term Goal 1 (Week 1): Pt will complete toilet transfer to drop arm BSC with max assist of one caregiver OT Short Term Goal 2 (Week 1): Pt will complete LB dressing with max assist of one caregiver OT Short Term Goal 3 (Week 1): Pt will complete bathing with min assist OT Short Term Goal 4 (Week 1): Pt will complete toileting with max assist of one caregiver  Skilled Therapeutic Interventions/Progress Updates:    1) Engaged in ADL retraining with focus on lateral leans, use of AE to complete self-care tasks, and transfers.  Pt in bed upon arrival with daughter and granddaughter present.  Completed bathing and dressing seated at EOB with focus on educating daughter about goals and purpose of OT and current progress.  Pt completed lateral leans for hygiene and to assist with LB dressing, however still required assist to fully pull pants over hips.  Utilized reacher and sockaid to complete LB dressing.  Provided pt with inspection mirror and reiterated need for desensitization and frequent checks of skin.  Slideboard transfer bed > w/c with assist to place board and then mod assist with transfer to promote liftoff of buttocks and decrease shearing.  2) Engaged in therapeutic activity with focus on transfers, anterior weight shift, lateral leans, and BUE strengthening.  Pt received upright in w/c, reporting having been OOB since her first session of the day.  Propelled w/c to therapy gym with 1 rest break.  In therapy gym, transferred to mat with slide board and mod assist to promote liftoff of buttocks.  Seated on edge of mat, engaged in BUE strengthening and dynamic sitting balance while completing ball batting activity with pt  using 2# dowel rod in BUE to bump ball back to therapist.  Attempted to complete tricep pushups with pt with difficulty lifting buttocks from mat.  Utilized arm chair in front of pt to promote anterior weight shift and pt completed 2 reps of 5 with sit > partial stand with mod-max assist to achieve clearance from mat.  Transferred back to w/c with slide board and left seated upright in w/c until next therapy session.  Therapy Documentation Precautions:  Precautions Precautions: Fall Precaution Comments: NWB R LE Restrictions Weight Bearing Restrictions: Yes RLE Weight Bearing: Non weight bearing Pain: Pain Assessment Pain Assessment: 0-10 Pain Score: 6 - Reports not wanting pain meds at this time  See FIM for current functional status  Therapy/Group: Individual Therapy  Simonne Come 01/07/2015, 12:07 PM

## 2015-01-07 NOTE — Progress Notes (Signed)
ANTICOAGULATION CONSULT NOTE - Follow Up Consult  Pharmacy Consult for Enoxaparin Indication: atrial fibrillation and DVT  No Known Allergies  Patient Measurements: Height: 5\' 11"  (180.3 cm) Weight: 181 lb 3.5 oz (82.2 kg) IBW/kg (Calculated) : 70.8    Vital Signs: Temp: 98.1 F (36.7 C) (05/22 0557) Temp Source: Oral (05/22 0557) BP: 133/55 mmHg (05/22 0557) Pulse Rate: 77 (05/22 0557)  Labs:  Recent Labs  01/05/15 1152 01/07/15 0538  HGB  --  8.1*  HCT  --  26.2*  PLT  --  466*  CREATININE 0.87  --     Estimated Creatinine Clearance: 64.4 mL/min (by C-G formula based on Cr of 0.87).  Assessment: 74yo female with recent DVT and hx AFib, on indefinite anticoagulation 2nd colon cancer. H/H is stable and platelets are elevated. No bleeding noted. Dose remains appropriate (1.5mg /kg/day).   Goal of Therapy:  Anti-Xa level 0.6-1 units/ml 4hrs after LMWH dose given Monitor platelets by anticoagulation protocol: Yes   Plan:  - Continue lovenox 120mg  SQ Q24H - F/u CBC Q72H, S&S of bleeding, renal fxn  Salome Arnt, PharmD, BCPS Pager # [phone] 01/07/2015 10:08 AM

## 2015-01-07 NOTE — Progress Notes (Signed)
Physical Therapy Session Note  Patient Details  Name: Kathryn Knapp MRN: [id number] Date of Birth: 03/28/1941  Today's Date: 01/07/2015 PT Individual Time: 1445-1530 PT Individual Time Calculation (min): 45 min   Short Term Goals: Week 1:  PT Short Term Goal 1 (Week 1): Patient will perform bed mobility short sit to and from supine max assist PT Short Term Goal 2 (Week 1): Patient will perfrom slide board transfer mod assist to a level surface. PT Short Term Goal 3 (Week 1): Patient will  stand with max assist and least restrictive asssistive device for one minute PT Short Term Goal 4 (Week 1): Patient will propel wheelchair 50 feet over level surfaces with stand by assistance. PT Short Term Goal 5 (Week 1): Patient will be able to educate on residual limb care with no more than minimal assistance for insepection and residual limb wrapping.   Skilled Therapeutic Interventions/Progress Updates:    Session focused on functional transfers with slideboard with emphasis on technique and increasing independence with transfers (steady A overall for transfer with A to place and remove slideboard with cues for encouragement),  overall activity tolerance and endurance, and w/c propulsion on carpeted surfaces and outside over uneven surfaces (S to min A needed) for community mobility training. [name]   Therapy Documentation Precautions:  Precautions Precautions: Fall Precaution Comments: NWB R LE Restrictions Weight Bearing Restrictions: Yes RLE Weight Bearing: Non weight bearing  Pain: C/o being cold and some discomfort at colostomy site. Declines intervention at this time.   See FIM for current functional status  Therapy/Group: Individual Therapy  [name] Ivory Broad, PT, DPT  01/07/2015, 3:43 PM

## 2015-01-07 NOTE — Progress Notes (Signed)
Physical Therapy Session Note  Patient Details  Name: Kathryn Knapp MRN: [id number] Date of Birth: Nov 26, 1940  Today's Date: 01/07/2015 PT Individual Time: 1100-1200 PT Individual Time Calculation (min): 60 min   Short Term Goals: Week 1:  PT Short Term Goal 1 (Week 1): Patient will perform bed mobility short sit to and from supine max assist PT Short Term Goal 2 (Week 1): Patient will perfrom slide board transfer mod assist to a level surface. PT Short Term Goal 3 (Week 1): Patient will  stand with max assist and least restrictive asssistive device for one minute PT Short Term Goal 4 (Week 1): Patient will propel wheelchair 50 feet over level surfaces with stand by assistance. PT Short Term Goal 5 (Week 1): Patient will be able to educate on residual limb care with no more than minimal assistance for insepection and residual limb wrapping.   Skilled Therapeutic Interventions/Progress Updates:   Session focused on overall activity tolerance/endurance, w/c parts management especially for setting up for transfers, w/c mobility for general UE strengthening and endurance training to and from therapy, functional bed mobility and slideboard transfers, and part task training to attempt sit to stands (partial sit to lift as pt too fearful to attempt sit to stand). At start of session pt request to use bedpan (declined transfer to toilet) and performed rolling at S level for daughter to place and remove bedpan. Pt performed all transfers with slideboard with min A for balance and assist to place and remove board 85% of the time with cues for set-up of w/c parts and min A to manage. Educated on importance of pt performing as much of the transfer for mobility as possible as pt's daughter quick to provide assistance. On EOM, worked on partial sit to lift up bottom with anterior weightshift to prepare for sit to stand training in future session. Pt unable to attempt sit to stand due to fear. Educated pt and pt's  daughter on prosthetic process as they had questions in regards to if she would be fitted for one before discharge. Pt able to perform lateral propped on elbow to sit x 5 reps each side to aid with supine <-> sit in the bed. End of session left up in w/c with daughter present assisting with set-up for lunch. Pt excited stating this is the first time she would be sitting up in the w/c for a meal. Educated on importance of OOB for meals and encouraged pt to ask staff for help to transfer OOB for meals if she would like. Both verbalized understanding.   Therapy Documentation Precautions:  Precautions Precautions: Fall Precaution Comments: NWB R LE Restrictions Weight Bearing Restrictions: Yes RLE Weight Bearing: Non weight bearing  Pain: Denies pain.  See FIM for current functional status  Therapy/Group: Individual Therapy  [name] [facility] 01/07/2015, 12:06 PM

## 2015-01-07 NOTE — Progress Notes (Signed)
Patient ID: Kathryn Knapp, female   DOB: [birth year], 74 y.o.   MRN: [id number]   Patient ID: Kathryn Knapp, female   DOB: 07/15/1941, 74 y.o.   MRN: [id number]    01/07/15.  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 Hospital course complicated by sepsis syndrome with acute respiratory failure and acute renal failure syndrome and acute blood loss anemia.  Hemoglobin trending down and 8.1 grams percent today  Past Medical History  Diagnosis Date  . Diabetes mellitus without complication   . Hypertension      Subjective/Complaints: No SOB, no leg pain or abd pain this am.  On complaint today is nocturia that interferes with sleep ROS  Negative except for midline abdominal pain, no phantom pain   Objective: Vital Signs: Blood pressure 133/55, pulse 77, temperature 98.1 F (36.7 C), temperature source Oral, resp. rate 19, height $RemoveBe'5\' 11"'GphBsWuyQ$  (1.803 m), weight 181 lb 3.5 oz (82.2 kg), SpO2 96 %. No results found. Results for orders placed or performed during the hospital encounter of 01/01/15 (from the past 72 hour(s))  Glucose, capillary     Status: None   Collection Time: 01/04/15 11:30 AM  Result Value Ref Range   Glucose-Capillary 94 65 - 99 mg/dL  Glucose, capillary     Status: None   Collection Time: 01/04/15  4:56 PM  Result Value Ref Range   Glucose-Capillary 91 65 - 99 mg/dL  Glucose, capillary     Status: Abnormal   Collection Time: 01/04/15  9:19 PM  Result Value Ref Range   Glucose-Capillary 107 (H) 65 - 99 mg/dL   Comment 1 Notify RN   Glucose, capillary     Status: None   Collection Time: 01/05/15  6:50 AM  Result Value Ref Range   Glucose-Capillary 80 65 - 99 mg/dL  Basic metabolic panel     Status: Abnormal   Collection Time: 01/05/15 11:52 AM   Result Value Ref Range   Sodium 139 135 - 145 mmol/L   Potassium 4.5 3.5 - 5.1 mmol/L   Chloride 101 101 - 111 mmol/L   CO2 31 22 - 32 mmol/L   Glucose, Bld 121 (H) 65 - 99 mg/dL   BUN 7 6 - 20 mg/dL   Creatinine, Ser 0.87 0.44 - 1.00 mg/dL   Calcium 7.7 (L) 8.9 - 10.3 mg/dL   GFR calc non Af Amer >60 >60 mL/min   GFR calc Af Amer >60 >60 mL/min    Comment: (NOTE) The eGFR has been calculated using the CKD EPI equation. This calculation has not been validated in all clinical situations. eGFR's persistently <60 mL/min signify possible Chronic Kidney Disease.    Anion gap 7 5 - 15  Glucose, capillary     Status: Abnormal   Collection Time: 01/05/15 11:58 AM  Result Value Ref Range   Glucose-Capillary 112 (H) 65 - 99 mg/dL  Glucose, capillary     Status: Abnormal   Collection Time: 01/05/15  5:01 PM  Result Value Ref Range   Glucose-Capillary 114 (H) 65 - 99 mg/dL  Glucose, capillary     Status: Abnormal   Collection Time: 01/05/15  9:18 PM  Result Value Ref Range   Glucose-Capillary 111 (H) 65 - 99 mg/dL  Glucose, capillary     Status: Abnormal   Collection Time: 01/06/15  6:39 AM  Result Value Ref Range   Glucose-Capillary 120 (H) 65 - 99 mg/dL  Glucose, capillary     Status: Abnormal   Collection Time: 01/06/15 11:18 AM  Result Value Ref Range   Glucose-Capillary 100 (H) 65 - 99 mg/dL   Comment 1 Notify RN   Glucose, capillary     Status: Abnormal   Collection Time: 01/06/15  4:28 PM  Result Value Ref Range   Glucose-Capillary 111 (H) 65 - 99 mg/dL   Comment 1 Notify RN   Glucose, capillary     Status: Abnormal   Collection Time: 01/06/15  9:14 PM  Result Value Ref Range   Glucose-Capillary 111 (H) 65 - 99 mg/dL  CBC     Status: Abnormal   Collection Time: 01/07/15  5:38 AM  Result Value Ref Range   WBC 8.5 4.0 - 10.5 K/uL   RBC 3.08 (L) 3.87 - 5.11 MIL/uL   Hemoglobin 8.1 (L) 12.0 - 15.0 g/dL   HCT 26.2 (L) 36.0 - 46.0 %   MCV 85.1 78.0 - 100.0 fL   MCH 26.3  26.0 - 34.0 pg   MCHC 30.9 30.0 - 36.0 g/dL   RDW 18.0 (H) 11.5 - 15.5 %   Platelets 466 (H) 150 - 400 K/uL  Glucose, capillary     Status: None   Collection Time: 01/07/15  6:48 AM  Result Value Ref Range   Glucose-Capillary 95 65 - 99 mg/dL     Patient Vitals for the past 24 hrs:  BP Temp Temp src Pulse Resp SpO2  01/07/15 0557 (!) 133/55 mmHg 98.1 F (36.7 C) Oral 77 19 96 %  01/06/15 1500 (!) 119/51 mmHg 97.7 F (36.5 C) Oral 93 18 98 %     Intake/Output Summary (Last 24 hours) at 01/07/15 0818 Last data filed at 01/06/15 2354  Gross per 24 hour  Intake    720 ml  Output    600 ml  Net    120 ml     HEENT: poor dentition-dentures in place Cardio: RRR  , grade 2/6 systolic murmur Resp: CTA B/L and unlabored GI: BS positive and midline incision, good granulation tissue,, wet to dry dressingsRLQ colostomy Extremity:   Right stump bandaged Skin:   Wound C/D/I; left-sided SCD Neuro: Alert/Oriented, Musc/Skel:  Other right BKA, healing well Gen. no acute distress   Assessment/Plan: 1. Functional deficits secondary to right BKA in a patient deconditioned with metastatic adenocarcinoma of the colon 2.  DVT Prophylaxis/Anticoagulation: Subcutaneous Lovenox. Monitor platelet counts and any signs of bleeding,discussed with hematology Dr Darnell Level who recommends full dose anticoag with lovenox without conversion to warfarin given active metastatic CA, for at least 6 mo, given wt loss (?BKA) will reduce dose to $Remov'120mg'BvAcmk$  q24h                 3. Pain Management: Neurontin 100 mg 3 times a day, Oxycodone. Monitor with increased mobility 4. Mood/depression: Prozac 20 mg daily.  5.  Skin/Wound Care: Local care to right BKA site as well as abdominal wound/ostomy and deep tissue injury to buttocks and sacrum.  WOC RN following for wound care. Has had some issues with seal of ostomy  6. Acute blood loss anemia. Follow-up CBC 7.  Diabetes mellitus with peripheral neuropathy. Hemoglobin A1c 6.3.  Sliding scale insulin. Check blood sugars before meals and at bedtime 8. Hypertension. Norvasc 5 mg daily. 9.  Nocturia.  Will give a trial off furosemide  LOS (Days) 6  A FACE TO FACE EVALUATION WAS PERFORMED  [name] 01/07/2015, 8:18 AM

## 2015-01-08 ENCOUNTER — Inpatient Hospital Stay (HOSPITAL_COMMUNITY): Payer: Medicare Other | Admitting: Occupational Therapy

## 2015-01-08 ENCOUNTER — Encounter (HOSPITAL_COMMUNITY): Payer: Medicare Other

## 2015-01-08 ENCOUNTER — Inpatient Hospital Stay (HOSPITAL_COMMUNITY): Payer: Medicare Other

## 2015-01-08 ENCOUNTER — Inpatient Hospital Stay (HOSPITAL_COMMUNITY): Payer: Medicare Other | Admitting: Physical Therapy

## 2015-01-08 LAB — CBC
HEMATOCRIT: 26.6 % — AB (ref 36.0–46.0)
HEMOGLOBIN: 8.1 g/dL — AB (ref 12.0–15.0)
MCH: 26 pg (ref 26.0–34.0)
MCHC: 30.5 g/dL (ref 30.0–36.0)
MCV: 85.5 fL (ref 78.0–100.0)
Platelets: 518 10*3/uL — ABNORMAL HIGH (ref 150–400)
RBC: 3.11 MIL/uL — ABNORMAL LOW (ref 3.87–5.11)
RDW: 18 % — ABNORMAL HIGH (ref 11.5–15.5)
WBC: 8.5 10*3/uL (ref 4.0–10.5)

## 2015-01-08 LAB — GLUCOSE, CAPILLARY
GLUCOSE-CAPILLARY: 118 mg/dL — AB (ref 65–99)
Glucose-Capillary: 85 mg/dL (ref 65–99)
Glucose-Capillary: 108 mg/dL — ABNORMAL HIGH (ref 65–99)
Glucose-Capillary: 101 mg/dL — ABNORMAL HIGH (ref 65–99)

## 2015-01-08 LAB — CREATININE, SERUM
Creatinine, Ser: 0.78 mg/dL (ref 0.44–1.00)
GFR calc non Af Amer: >60 mL/min (ref >60)

## 2015-01-08 NOTE — Progress Notes (Signed)
Subjective/Complaints: Patient had therapy this morning, no issues. Has mild pain at BK incision but no phantom limb pain. Patient asking whether colostomy is permanent ROS  Negative except for midline abdominal pain,   Objective: Vital Signs: Blood pressure 133/59, pulse 79, temperature 97.8 F (36.6 C), temperature source Oral, resp. rate 19, height 5\' 11"  (1.803 m), weight 82.2 kg (181 lb 3.5 oz), SpO2 93 %. No results found. Results for orders placed or performed during the hospital encounter of 01/01/15 (from the past 72 hour(s))  Basic metabolic panel     Status: Abnormal   Collection Time: 01/05/15 11:52 AM  Result Value Ref Range   Sodium 139 135 - 145 mmol/L   Potassium 4.5 3.5 - 5.1 mmol/L   Chloride 101 101 - 111 mmol/L   CO2 31 22 - 32 mmol/L   Glucose, Bld 121 (H) 65 - 99 mg/dL   BUN 7 6 - 20 mg/dL   Creatinine, Ser 01/07/15 0.44 - 1.00 mg/dL   Calcium 7.7 (L) 8.9 - 10.3 mg/dL   GFR calc non Af Amer >60 >60 mL/min   GFR calc Af Amer >60 >60 mL/min    Comment: (NOTE) The eGFR has been calculated using the CKD EPI equation. This calculation has not been validated in all clinical situations. eGFR's persistently <60 mL/min signify possible Chronic Kidney Disease.    Anion gap 7 5 - 15  Glucose, capillary     Status: Abnormal   Collection Time: 01/05/15 11:58 AM  Result Value Ref Range   Glucose-Capillary 112 (H) 65 - 99 mg/dL  Glucose, capillary     Status: Abnormal   Collection Time: 01/05/15  5:01 PM  Result Value Ref Range   Glucose-Capillary 114 (H) 65 - 99 mg/dL  Glucose, capillary     Status: Abnormal   Collection Time: 01/05/15  9:18 PM  Result Value Ref Range   Glucose-Capillary 111 (H) 65 - 99 mg/dL  Glucose, capillary     Status: Abnormal   Collection Time: 01/06/15  6:39 AM  Result Value Ref Range   Glucose-Capillary 120 (H) 65 - 99 mg/dL  Glucose, capillary     Status: Abnormal   Collection Time: 01/06/15 11:18 AM  Result Value Ref Range   Glucose-Capillary 100 (H) 65 - 99 mg/dL   Comment 1 Notify RN   Glucose, capillary     Status: Abnormal   Collection Time: 01/06/15  4:28 PM  Result Value Ref Range   Glucose-Capillary 111 (H) 65 - 99 mg/dL   Comment 1 Notify RN   Glucose, capillary     Status: Abnormal   Collection Time: 01/06/15  9:14 PM  Result Value Ref Range   Glucose-Capillary 111 (H) 65 - 99 mg/dL  CBC     Status: Abnormal   Collection Time: 01/07/15  5:38 AM  Result Value Ref Range   WBC 8.5 4.0 - 10.5 K/uL   RBC 3.08 (L) 3.87 - 5.11 MIL/uL   Hemoglobin 8.1 (L) 12.0 - 15.0 g/dL   HCT 01/09/15 (L) 44.8 - 30.1 %   MCV 85.1 78.0 - 100.0 fL   MCH 26.3 26.0 - 34.0 pg   MCHC 30.9 30.0 - 36.0 g/dL   RDW 59.9 (H) 68.9 - 57.0 %   Platelets 466 (H) 150 - 400 K/uL  Glucose, capillary     Status: None   Collection Time: 01/07/15  6:48 AM  Result Value Ref Range   Glucose-Capillary 95 65 - 99 mg/dL  Glucose, capillary     Status: Abnormal   Collection Time: 01/07/15 12:10 PM  Result Value Ref Range   Glucose-Capillary 100 (H) 65 - 99 mg/dL  Glucose, capillary     Status: Abnormal   Collection Time: 01/07/15  4:49 PM  Result Value Ref Range   Glucose-Capillary 108 (H) 65 - 99 mg/dL  Glucose, capillary     Status: Abnormal   Collection Time: 01/07/15  8:58 PM  Result Value Ref Range   Glucose-Capillary 108 (H) 65 - 99 mg/dL  Creatinine, serum     Status: None   Collection Time: 01/08/15  6:18 AM  Result Value Ref Range   Creatinine, Ser 0.78 0.44 - 1.00 mg/dL   GFR calc non Af Amer >60 >60 mL/min   GFR calc Af Amer >60 >60 mL/min    Comment: (NOTE) The eGFR has been calculated using the CKD EPI equation. This calculation has not been validated in all clinical situations. eGFR's persistently <60 mL/min signify possible Chronic Kidney Disease.   CBC     Status: Abnormal   Collection Time: 01/08/15  6:18 AM  Result Value Ref Range   WBC 8.5 4.0 - 10.5 K/uL   RBC 3.11 (L) 3.87 - 5.11 MIL/uL   Hemoglobin 8.1  (L) 12.0 - 15.0 g/dL   HCT 26.6 (L) 36.0 - 46.0 %   MCV 85.5 78.0 - 100.0 fL   MCH 26.0 26.0 - 34.0 pg   MCHC 30.5 30.0 - 36.0 g/dL   RDW 18.0 (H) 11.5 - 15.5 %   Platelets 518 (H) 150 - 400 K/uL  Glucose, capillary     Status: None   Collection Time: 01/08/15  7:03 AM  Result Value Ref Range   Glucose-Capillary 85 65 - 99 mg/dL     HEENT: poor dentition Cardio: RRR and tachy Resp: CTA B/L and unlabored GI: BS positive and midline incision, good granulation tissue,, wet to dry dressingsRLQ colostomy Extremity:  Edema Right stump Skin:   Wound C/D/I Neuro: Alert/Oriented, Anxious and Abnormal Motor 4/5 bilateral deltoids, biceps, triceps, grip, 4/5 right hip flexor, 4/5 left hip flexor knee extensor ankle dorsiflexor Musc/Skel:  Other right BKA, healing well, staples intact Gen. no acute distress   Assessment/Plan: 1. 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 Physiatrist is providing close team supervision and 24 hour management of active medical problems listed below. Physiatrist and rehab team continue to assess barriers to discharge/monitor patient progress toward functional and medical goals.  FIM: FIM - Bathing Bathing Steps Patient Completed: Chest, Right Arm, Left Arm, Abdomen, Right upper leg, Left upper leg, Front perineal area Bathing: 3: Mod-Patient completes 5-7 54f 10 parts or 50-74%  FIM - Upper Body Dressing/Undressing Upper body dressing/undressing steps patient completed: Thread/unthread right sleeve of pullover shirt/dresss, Thread/unthread left sleeve of pullover shirt/dress, Put head through opening of pull over shirt/dress, Pull shirt over trunk Upper body dressing/undressing: 5: Supervision: Safety issues/verbal cues FIM - Lower Body Dressing/Undressing Lower body dressing/undressing  steps patient completed: Thread/unthread right pants leg, Thread/unthread left pants leg, Don/Doff left sock Lower body dressing/undressing: 4: Min-Patient completed 75 plus % of tasks  FIM - Toileting Toileting steps completed by patient: Performs perineal hygiene Toileting: 2: Max-Patient completed 1 of 3 steps  FIM - Radio producer Devices: Bedside commode, Sliding board Toilet Transfers: 2-To toilet/BSC: Max  A (lift and lower assist), 2-From toilet/BSC: Max A (lift and lower assist), 1-Two helpers  FIM - Control and instrumentation engineer Devices: Sliding board, Arm rests, Bed rails, HOB elevated Bed/Chair Transfer: 5: Supine > Sit: Supervision (verbal cues/safety issues), 4: Sit > Supine: Min A (steadying pt. > 75%/lift 1 leg), 4: Bed > Chair or W/C: Min A (steadying Pt. > 75%), 4: Chair or W/C > Bed: Min A (steadying Pt. > 75%)  FIM - Locomotion: Wheelchair Distance: 150 Locomotion: Wheelchair: 5: Travels 150 ft or more: maneuvers on rugs and over door sills with supervision, cueing or coaxing FIM - Locomotion: Ambulation Ambulation/Gait Assistance: Not tested (comment) (not safe at this time) Locomotion: Ambulation: 0: Activity did not occur  Comprehension Comprehension Mode: Auditory Comprehension: 6-Follows complex conversation/direction: With extra time/assistive device  Expression Expression Mode: Verbal Expression: 6-Expresses complex ideas: With extra time/assistive device  Social Interaction Social Interaction: 6-Interacts appropriately with others with medication or extra time (anti-anxiety, antidepressant).  Problem Solving Problem Solving: 5-Solves complex 90% of the time/cues < 10% of the time  Memory Memory: 5-Recognizes or recalls 90% of the time/requires cueing < 10% of the time  Medical Problem List and Plan: 1. 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  DVT Prophylaxis/Anticoagulation: Subcutaneous Lovenox. Monitor platelet counts and any signs of bleeding,discussed with hematology.  Hgb stable Dr Beryle Beams who recommends full dose anticoag with lovenox without conversion to warfarin given active metastatic CA, for at least 6 mo, given wt loss (?BKA) will reduce dose to $Remov'120mg'KsTZqF$  q24h                3. Pain Management: Neurontin 100 mg 3 times a day, Oxycodone. Monitor with increased mobility 4. Mood/depression: Prozac 20 mg daily. 5. Neuropsych: This patient is capable of making decisions on her own behalf. 6. Skin/Wound Care: Local care to right BKA site as well as abdominal wound/ostomy and deep tissue injury to buttocks and sacrum.  WOC RN following for wound care. Has had some issues with seal of ostomy 7. Fluids/Electrolytes/Nutrition: Routine I nose with follow-up chemistries, prob fluid overload will gently diurese 8. Acute blood loss anemia. Follow-up CBC tonight 9. Diabetes mellitus with peripheral neuropathy. Hemoglobin A1c 6.3. Sliding scale insulin. Check blood sugars before meals and at bedtime 10. Hypertension. Norvasc 5 mg daily. 11.  Hypoxia improved, recheck chest x-ray shows pleural effusion, continue prn supplemental O2, afebrile, lung exam with reduced BS, will diurese 12.  HypoK mild will supplement and watch during diuresis, check BMET LOS (Days) Vinton E 01/08/2015, 8:45 AM

## 2015-01-08 NOTE — Telephone Encounter (Signed)
This has been scheduled.  Message left on daughter's vm with appt time.

## 2015-01-08 NOTE — Progress Notes (Signed)
Physical Therapy Session Note  Patient Details  Name: Kathryn Knapp MRN: [id number] Date of Birth: 02/12/1941  Today's Date: 01/08/2015  PT Individual Time: 1435-1530 PT Individual Time Calculation (min): 55 min   Short Term Goals: Week 1:  PT Short Term Goal 1 (Week 1): Patient will perform bed mobility short sit to and from supine max assist PT Short Term Goal 2 (Week 1): Patient will perfrom slide board transfer mod assist to a level surface. PT Short Term Goal 3 (Week 1): Patient will  stand with max assist and least restrictive asssistive device for one minute PT Short Term Goal 4 (Week 1): Patient will propel wheelchair 50 feet over level surfaces with stand by assistance. PT Short Term Goal 5 (Week 1): Patient will be able to educate on residual limb care with no more than minimal assistance for insepection and residual limb wrapping.   Skilled Therapeutic Interventions/Progress Updates:   Session focused on transfer training and strengthening. Patient received on UBE with handoff from OT, finished last 2-3 min with one rest break to reach goal of 10 min total. Patient set up wheelchair for transfer with supervision and directed therapist to remove R amputee pad due to decreased AROM/sensation in R hand and performed slide board transfers with assist for board placement and steady A, able to recall head hips relationship but required verbal cues to maintain forward weight shift in order to clear hips. Patient tended to start in forward lean but then rocked backward when actually scooting. 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 Sit > supine with supervision and supine > sit with min A on mat. Supine therex for RLE strengthening/ROM: SLR, hip abduction, bridging over bolster, SAQ over bolster, 2 x 10 each exercise. Supine R hamstring stretch 2 x 60 sec. Patient propelled wheelchair back to room with rest breaks as needed and supervision.  Patient returned to bed and RN notified of colostomy bag leaking. Patient left semi reclined in bed with bed alarm on and needs within reach.     Therapy Documentation Precautions:  Precautions Precautions: Fall Precaution Comments: NWB R LE Restrictions Weight Bearing Restrictions: Yes RLE Weight Bearing: Non weight bearing Pain: Pain Assessment Pain Assessment: 0-10 Pain Score: 6  Pain Type: Surgical pain Pain Location: Abdomen Pain Orientation: Right;Left Pain Descriptors / Indicators: Aching  See FIM for current functional status  Therapy/Group: Individual Therapy  [name] 01/08/2015, 4:01 PM

## 2015-01-08 NOTE — Progress Notes (Signed)
Physical Therapy Session Note  Patient Details  Name: Kathryn Knapp MRN: [id number] Date of Birth: March 11, 1941  Today's Date: 01/08/2015 PT Individual Time: 0800-0900 PT Individual Time Calculation (min): 60 min   Short Term Goals: Week 1:  PT Short Term Goal 1 (Week 1): Patient will perform bed mobility short sit to and from supine max assist PT Short Term Goal 2 (Week 1): Patient will perfrom slide board transfer mod assist to a level surface. PT Short Term Goal 3 (Week 1): Patient will  stand with max assist and least restrictive asssistive device for one minute PT Short Term Goal 4 (Week 1): Patient will propel wheelchair 50 feet over level surfaces with stand by assistance. PT Short Term Goal 5 (Week 1): Patient will be able to educate on residual limb care with no more than minimal assistance for insepection and residual limb wrapping.   Skilled Therapeutic Interventions/Progress Updates:   Session focused on functional transfer training with slideboard with emphasis on technique (cues for head hips relationship and steady A for balance), bed mobility training on flat surface with min A and cues for technique, LE therex for strengthening and ROM to RLE, education on skin inspection and purpose of ACE wrapping (demonstrated ACE wrap technique and handout given), and w/c mobility training in controlled environment. Therex included sidelying R hip abduction and extension, supine SLR, hip abduction, and knee flex/extension x 10 reps x 2 sets each with cues for technique. Returned to room end of session with RN notified to check pt colostomy. Pt up in w/c with RN present.   Therapy Documentation Precautions:  Precautions Precautions: Fall Precaution Comments: NWB R LE Restrictions Weight Bearing Restrictions: Yes RLE Weight Bearing: Non weight bearing  Pain:  Denies pain.  See FIM for current functional status  Therapy/Group: Individual Therapy  [name] Ivory Broad, PT, DPT  01/08/2015, 10:41 AM

## 2015-01-08 NOTE — Progress Notes (Signed)
Occupational Therapy Session Note  Patient Details  Name: Kathryn Knapp MRN: [id number] Date of Birth: 1941-05-14  Today's Date: 01/08/2015 OT Individual Time: 1000-1100 and 1405-1435 OT Individual Time Calculation (min): 60 min and 30 min   Short Term Goals: Week 1:  OT Short Term Goal 1 (Week 1): Pt will complete toilet transfer to drop arm BSC with max assist of one caregiver OT Short Term Goal 2 (Week 1): Pt will complete LB dressing with max assist of one caregiver OT Short Term Goal 3 (Week 1): Pt will complete bathing with min assist OT Short Term Goal 4 (Week 1): Pt will complete toileting with max assist of one caregiver  Skilled Therapeutic Interventions/Progress Updates:    1) Engaged in therapeutic activity with focus on transfers, activity tolerance, and w/c propulsion and maneuvering in home environment.  Upon arrival, pt reports sensation that ostomy bag was leaking and requested RN to change bag.  Slide board transfer w/c > bed with assist to place board and then mod faded to min assist with transfer with tactile cues to remain forward weight shift.  Engaged in discussion of home setup during RN care.  Slide board transfer back to w/c as above.  W/c propulsion on unit to ADL apt with pt requiring 3 rest breaks (approx 150 feet in total).  W/c maneuvering in simulated home environment with pt problem solving accessibility of items in kitchen and bedroom.  Educated on w/c placement to side of desired object to promote reaching to side instead of forward which pt able to carryover into obtaining items from dresser and closet with min cues.    2) Engaged in therapeutic activity with focus on activity tolerance and endurance.  Pt in bed upon arrival reporting that she had been having extreme pain in stomach but with rest and meds that had decreased.  Slideboard transfer to w/c with assist to position and min-mod assist to promote anterior weight shift and appropriate lifting.  Pt  propelled w/c to therapy gym ~100 feet with 1 rest break.  Completed UE arm bike for 10 mins with 5 mins forward and 5 backwards and pt taking a rest break every 1-2 mins.Pt reports feeling that she is getting stronger but reports frustration with lack of endurance.  Passed pt off to PT while completing last 2 min of UE arm bike.  Therapy Documentation Precautions:  Precautions Precautions: Fall Precaution Comments: NWB R LE Restrictions Weight Bearing Restrictions: Yes RLE Weight Bearing: Non weight bearing Pain: Pain Assessment Pain Assessment: 0-10 Pain Score: 10-Worst pain ever Faces Pain Scale: Hurts whole lot Pain Type: Surgical pain Pain Location: Abdomen Pain Orientation: Right;Left Pain Descriptors / Indicators: Aching Pain Frequency: Intermittent Pain Onset: Gradual Patients Stated Pain Goal: 2 Pain Intervention(s): Medication (See eMAR);Repositioned Multiple Pain Sites: Yes 2nd Pain Site Pain Score: 10 Pain Type: Surgical pain Pain Location: Leg Pain Orientation: Right Pain Descriptors / Indicators: Throbbing Pain Frequency: Intermittent Pain Onset: Gradual Patient's Stated Pain Goal: 2 Pain Intervention(s): Medication (See eMAR);Repositioned  See FIM for current functional status  Therapy/Group: Individual Therapy  Simonne Come 01/08/2015, 12:43 PM

## 2015-01-09 ENCOUNTER — Inpatient Hospital Stay (HOSPITAL_COMMUNITY): Payer: Medicare Other

## 2015-01-09 ENCOUNTER — Inpatient Hospital Stay (HOSPITAL_COMMUNITY): Payer: Medicare Other | Admitting: Occupational Therapy

## 2015-01-09 ENCOUNTER — Other Ambulatory Visit: Payer: Self-pay | Admitting: *Deleted

## 2015-01-09 ENCOUNTER — Inpatient Hospital Stay (HOSPITAL_COMMUNITY): Payer: Medicare Other | Admitting: Physical Therapy

## 2015-01-09 ENCOUNTER — Telehealth: Payer: Self-pay | Admitting: Hematology

## 2015-01-09 ENCOUNTER — Telehealth: Payer: Self-pay | Admitting: *Deleted

## 2015-01-09 LAB — GLUCOSE, CAPILLARY
Glucose-Capillary: 93 mg/dL (ref 65–99)
Glucose-Capillary: 112 mg/dL — ABNORMAL HIGH (ref 65–99)
Glucose-Capillary: 99 mg/dL (ref 65–99)
Glucose-Capillary: 111 mg/dL — ABNORMAL HIGH (ref 65–99)

## 2015-01-09 NOTE — Progress Notes (Signed)
Physical Therapy Session Note  Patient Details  Name: Kathryn Knapp MRN: [id number] Date of Birth: 19-Jun-1941  Today's Date: 01/09/2015 PT Individual Time: 1000-1030 PT Individual Time Calculation (min): 30 min   Short Term Goals: Week 1:  PT Short Term Goal 1 (Week 1): Patient will perform bed mobility short sit to and from supine max assist PT Short Term Goal 1 - Progress (Week 1): Met PT Short Term Goal 2 (Week 1): Patient will perfrom slide board transfer mod assist to a level surface. PT Short Term Goal 2 - Progress (Week 1): Met PT Short Term Goal 3 (Week 1): Patient will  stand with max assist and least restrictive asssistive device for one minute PT Short Term Goal 3 - Progress (Week 1): Met PT Short Term Goal 4 (Week 1): Patient will propel wheelchair 50 feet over level surfaces with stand by assistance. PT Short Term Goal 4 - Progress (Week 1): Met PT Short Term Goal 5 (Week 1): Patient will be able to educate on residual limb care with no more than minimal assistance for insepection and residual limb wrapping.  PT Short Term Goal 5 - Progress (Week 1): Progressing toward goal  Skilled Therapeutic Interventions/Progress Updates:  Session focused on wheelchair mobility and LE exercise. Patient propelled wheelchair 150 feet on level tile surfaces with supervision. Patient propelled up 5% ramp with min assist. Patient propelled up and down 3% grade ramp x 2 with supervision. Patient performed LE exercises for strengthening and ROM in sitting including: knee extension, hip flexion, hip adduction squeezes x 20 reps each. Patient returned to room and left in wheelchair with all items in reach.  Therapy Documentation Precautions:  Precautions Precautions: Fall Precaution Comments: NWB R LE Restrictions Weight Bearing Restrictions: Yes RLE Weight Bearing: Non weight bearing   Pain: Pain Assessment Pain Assessment: 0-10 Pain Score: 8  Pain Type: Surgical pain Pain Location:  Abdomen Pain Descriptors / Indicators: Aching;Pressure;Sore Mobility:   Locomotion : Wheelchair Mobility Distance: 150   See FIM for current functional status  Therapy/Group: Individual Therapy  Sanjuana Letters 01/09/2015, 12:20 PM

## 2015-01-09 NOTE — Telephone Encounter (Signed)
R/S appt to 06/09 @ 1:15 w/Dr. Burr Medico

## 2015-01-09 NOTE — Telephone Encounter (Signed)
Notified pt's daughter of new appt time for 01/25/15.

## 2015-01-09 NOTE — Progress Notes (Signed)
Physical Therapy Session Note  Patient Details  Name: Kathryn Knapp MRN: [id number] Date of Birth: 08-24-1940  Today's Date: 01/09/2015 PT Individual Time: 1300-1330 PT Individual Time Calculation (min): 30 min   Short Term Goals: Week 2:  PT Short Term Goal 1 (Week 2): Patient will perform bed mobility with stand by assistance  PT Short Term Goal 2 (Week 2): Patient will perform slide board transfer with min assist to a level surface PT Short Term Goal 3 (Week 2): Patient will stand with mod assist for 2 minutes with least restrictive assistive device.  PT Short Term Goal 4 (Week 2): Patient will propel wheelchair 150 feet over level surfaces with close supervision  PT Short Term Goal 5 (Week 2): Patient will be able to educate and perform residual limb care with no more than minimal assistance for insepection and residual limb wrapping.       Therapy Documentation Precautions:  Precautions Precautions: Fall Precaution Comments: NWB R LE Restrictions Weight Bearing Restrictions: Yes RLE Weight Bearing: Non weight bearing  Pain:  Patient denies any pain.   Patient received in bed min assist for bed mobility with use of handrail. Patient sat edge on bed independently to perform residual limb care, inspection and shaping with use of ACE wrap. Review education, process and technique for residual limb inspection with long handled mirror. Mild redness noted along incision. Patient provided non-adherent dressing, kerlix, and two ACE wraps to perform residual limb wrapping and shaping. Patient required mod cues and assistance for proper technique. Two trials were performed in order to assist with decreasing sensitivity and improve carry-over of technique.   Patient returned to bed at end of session with all needs met. Patient was without complaints and denied any pain.     Therapy/Group: Individual Therapy  [name] 01/09/2015, 3:26 PM

## 2015-01-09 NOTE — Progress Notes (Signed)
Physical Therapy Weekly Progress Note  Patient Details  Name: Kathryn Knapp MRN: [id number] Date of Birth: Dec 28, 1940  Beginning of progress report period: Jan 02, 2015 End of progress report period: Jan 09, 2015  Today's Date: 01/09/2015 PT Individual Time: 0815 am- 0930 am 75 minutes    Patient has met 5 of 5 short term goals.   Patient continues to demonstrate the following deficits: decreased cardiovascular endurance, strength, dynamic balance,  and therefore will continue to benefit from skilled PT intervention to enhance overall performance with activity tolerance, balance, postural control, attention, awareness, knowledge of precautions and residual limb care, and wheelchair management and mobility.   Patient progressing toward long term goals.  Continue plan of care.  Patient has made consistent progress in all areas of her physical therapy. Patient continues to self-limit with progress secondary to anxiety during new tasks. Patient able to begin to direct transfers and functional mobility.  Patient requires decreased rest breaks during session. Anticipated family training to begin end of this week to early next week.    PT Short Term Goals Week 2:  PT Short Term Goal 1 (Week 2): Patient will perform bed mobility with stand by assistance  PT Short Term Goal 2 (Week 2): Patient will perform slide board transfer with min assist to a level surface PT Short Term Goal 3 (Week 2): Patient will stand with mod assist for 2 minutes with least restrictive assistive device.  PT Short Term Goal 4 (Week 2): Patient will propel wheelchair 150 feet over level surfaces with close supervision  PT Short Term Goal 5 (Week 2): Patient will be able to educate and perform residual limb care with no more than minimal assistance for insepection and residual limb wrapping.   Skilled Therapeutic Interventions/Progress Updates:   Patient performed bed mobility with minimal assistance and use of bed rail.    Patient transferred from bed to wheelchair mod assist for set-up and min assist for transfer.   Patient transferred to and from mat to wheelchair mod assist for set-up and min assist for transfer.   Patient performed car transfer with slide board mod assist.   Patient requires verbal and tactile cues during all slide board transfers for proper positioning, sequence and technique.   Residual limb inspection and figure 8 ACE wrapping performed. Patient required min assist for limb inspection. Patient continued to be educated and help direct figure ACE wrapping for shaping of residual limb.   Residual limb is inspection revealed erythema along inspection and staples. Dr. Letta Pate  Present for residual limb inspection.   Patient propelled wheelchair 100 feetx3 supervision over level tile surface verbal cues for negotiating turns and tight spaces.   UBE 4 minutes  Patient returned to room at end of session resting in wheelchair with all needs met and call bell and phone within reach. Patient demonstrated improved endurance, upper extremity strength and transfer technique. Grandson present in room at end of session.   Therapy Documentation Precautions:  Precautions Precautions: Fall Precaution Comments: NWB R LE Restrictions Weight Bearing Restrictions: Yes RLE Weight Bearing: Non weight bearing    Pain: Pain Assessment Pain Assessment: 0-10 Pain Score: 8  Pain Location: Abdomen  See FIM for current functional status  Therapy/Group: Individual Therapy  [name] 01/09/2015, 3:21 PM

## 2015-01-09 NOTE — Progress Notes (Signed)
Subjective/Complaints: Working with PT, learning about stump wrapping, has phantom limb sensation but not phantom limb pain on the right ROS  Negative except for midline abdominal pain,   Objective: Vital Signs: Blood pressure 126/52, pulse 81, temperature 98.1 F (36.7 C), temperature source Oral, resp. rate 17, height 5' 11" (1.803 m), weight 82.2 kg (181 lb 3.5 oz), SpO2 96 %. No results found. Results for orders placed or performed during the hospital encounter of 01/01/15 (from the past 72 hour(s))  Glucose, capillary     Status: Abnormal   Collection Time: 01/06/15 11:18 AM  Result Value Ref Range   Glucose-Capillary 100 (H) 65 - 99 mg/dL   Comment 1 Notify RN   Glucose, capillary     Status: Abnormal   Collection Time: 01/06/15  4:28 PM  Result Value Ref Range   Glucose-Capillary 111 (H) 65 - 99 mg/dL   Comment 1 Notify RN   Glucose, capillary     Status: Abnormal   Collection Time: 01/06/15  9:14 PM  Result Value Ref Range   Glucose-Capillary 111 (H) 65 - 99 mg/dL  CBC     Status: Abnormal   Collection Time: 01/07/15  5:38 AM  Result Value Ref Range   WBC 8.5 4.0 - 10.5 K/uL   RBC 3.08 (L) 3.87 - 5.11 MIL/uL   Hemoglobin 8.1 (L) 12.0 - 15.0 g/dL   HCT 26.2 (L) 36.0 - 46.0 %   MCV 85.1 78.0 - 100.0 fL   MCH 26.3 26.0 - 34.0 pg   MCHC 30.9 30.0 - 36.0 g/dL   RDW 18.0 (H) 11.5 - 15.5 %   Platelets 466 (H) 150 - 400 K/uL  Glucose, capillary     Status: None   Collection Time: 01/07/15  6:48 AM  Result Value Ref Range   Glucose-Capillary 95 65 - 99 mg/dL  Glucose, capillary     Status: Abnormal   Collection Time: 01/07/15 12:10 PM  Result Value Ref Range   Glucose-Capillary 100 (H) 65 - 99 mg/dL  Glucose, capillary     Status: Abnormal   Collection Time: 01/07/15  4:49 PM  Result Value Ref Range   Glucose-Capillary 108 (H) 65 - 99 mg/dL  Glucose, capillary     Status: Abnormal   Collection Time: 01/07/15  8:58 PM  Result Value Ref Range   Glucose-Capillary  108 (H) 65 - 99 mg/dL  Creatinine, serum     Status: None   Collection Time: 01/08/15  6:18 AM  Result Value Ref Range   Creatinine, Ser 0.78 0.44 - 1.00 mg/dL   GFR calc non Af Amer >60 >60 mL/min   GFR calc Af Amer >60 >60 mL/min    Comment: (NOTE) The eGFR has been calculated using the CKD EPI equation. This calculation has not been validated in all clinical situations. eGFR's persistently <60 mL/min signify possible Chronic Kidney Disease.   CBC     Status: Abnormal   Collection Time: 01/08/15  6:18 AM  Result Value Ref Range   WBC 8.5 4.0 - 10.5 K/uL   RBC 3.11 (L) 3.87 - 5.11 MIL/uL   Hemoglobin 8.1 (L) 12.0 - 15.0 g/dL   HCT 26.6 (L) 36.0 - 46.0 %   MCV 85.5 78.0 - 100.0 fL   MCH 26.0 26.0 - 34.0 pg   MCHC 30.5 30.0 - 36.0 g/dL   RDW 18.0 (H) 11.5 - 15.5 %   Platelets 518 (H) 150 - 400 K/uL  Glucose, capillary  Status: None   Collection Time: 01/08/15  7:03 AM  Result Value Ref Range   Glucose-Capillary 85 65 - 99 mg/dL  Glucose, capillary     Status: Abnormal   Collection Time: 01/08/15 11:15 AM  Result Value Ref Range   Glucose-Capillary 118 (H) 65 - 99 mg/dL  Glucose, capillary     Status: Abnormal   Collection Time: 01/08/15  4:38 PM  Result Value Ref Range   Glucose-Capillary 108 (H) 65 - 99 mg/dL  Glucose, capillary     Status: Abnormal   Collection Time: 01/08/15  9:29 PM  Result Value Ref Range   Glucose-Capillary 101 (H) 65 - 99 mg/dL  Glucose, capillary     Status: None   Collection Time: 01/09/15  6:57 AM  Result Value Ref Range   Glucose-Capillary 93 65 - 99 mg/dL     HEENT: poor dentition Cardio: RRR and tachy Resp: CTA B/L and unlabored GI: BS positive and midline incision, good granulation tissue,, wet to dry dressingsRLQ colostomy Extremity:  Edema Right stump Skin:   Wound C/D/I Neuro: Alert/Oriented, Anxious and Abnormal Motor 4/5 bilateral deltoids, biceps, triceps, grip, 4/5 right hip flexor, 4/5 left hip flexor knee extensor ankle  dorsiflexor Musc/Skel:  Other right BKA, healing well, staples intact, mild erythema around the incision site, no drainage Gen. no acute distress   Assessment/Plan: 1. 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 Physiatrist is providing close team supervision and 24 hour management of active medical problems listed below. Physiatrist and rehab team continue to assess barriers to discharge/monitor patient progress toward functional and medical goals.  FIM: FIM - Bathing Bathing Steps Patient Completed: Chest, Right Arm, Left Arm, Abdomen, Right upper leg, Left upper leg, Front perineal area Bathing: 3: Mod-Patient completes 5-7 0f 10 parts or 50-74%  FIM - Upper Body Dressing/Undressing Upper body dressing/undressing steps patient completed: Thread/unthread right sleeve of pullover shirt/dresss, Thread/unthread left sleeve of pullover shirt/dress, Put head through opening of pull over shirt/dress, Pull shirt over trunk Upper body dressing/undressing: 5: Supervision: Safety issues/verbal cues FIM - Lower Body Dressing/Undressing Lower body dressing/undressing steps patient completed: Thread/unthread right pants leg, Thread/unthread left pants leg, Don/Doff left sock Lower body dressing/undressing: 4: Min-Patient completed 75 plus % of tasks  FIM - Toileting Toileting steps completed by patient: Performs perineal hygiene Toileting: 2: Max-Patient completed 1 of 3 steps  FIM - Toilet Transfers Toilet Transfers Assistive Devices: Bedside commode, Sliding board Toilet Transfers: 2-To toilet/BSC: Max A (lift and lower assist), 2-From toilet/BSC: Max A (lift and lower assist), 1-Two helpers  FIM - Bed/Chair Transfer Bed/Chair Transfer Assistive Devices: Sliding board, Arm rests Bed/Chair Transfer: 4: Supine > Sit: Min A  (steadying Pt. > 75%/lift 1 leg), 5: Sit > Supine: Supervision (verbal cues/safety issues), 4: Chair or W/C > Bed: Min A (steadying Pt. > 75%), 4: Bed > Chair or W/C: Min A (steadying Pt. > 75%)  FIM - Locomotion: Wheelchair Distance: 150 Locomotion: Wheelchair: 2: Travels 50 - 149 ft with supervision, cueing or coaxing FIM - Locomotion: Ambulation Ambulation/Gait Assistance: Not tested (comment) (not safe at this time) Locomotion: Ambulation: 0: Activity did not occur  Comprehension Comprehension Mode: Auditory Comprehension: 6-Follows complex conversation/direction: With extra time/assistive device  Expression Expression Mode: Verbal Expression: 6-Expresses complex ideas: With extra time/assistive device  Social Interaction Social Interaction: 6-Interacts appropriately with others with   medication or extra time (anti-anxiety, antidepressant).  Problem Solving Problem Solving: 5-Solves complex 90% of the time/cues < 10% of the time  Memory Memory: 5-Recognizes or recalls 90% of the time/requires cueing < 10% of the time  Medical Problem List and Plan: 1. 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  DVT Prophylaxis/Anticoagulation: Subcutaneous Lovenox. Monitor platelet counts and any signs of bleeding,discussed with hematology.  Hgb stable Dr Granfortuna who recommends full dose anticoag with lovenox without conversion to warfarin given active metastatic CA, for at least 6 mo, given wt loss (?BKA) will reduce dose to 120mg q24h                3. Pain Management: Neurontin 100 mg 3 times a day, Oxycodone. Monitor with increased mobility 4. Mood/depression: Prozac 20 mg daily. 5. Neuropsych: This patient is capable of making decisions on her own behalf. 6. Skin/Wound Care: Local care to right BKA site as well  as abdominal wound/ostomy and deep tissue injury to buttocks and sacrum.  WOC RN following for wound care. Has had some issues with seal of ostomy 7. Fluids/Electrolytes/Nutrition: Routine I nose with follow-up chemistries, prob fluid overload will gently diurese 8. Acute blood loss anemia. Follow-up CBC tonight 9. Diabetes mellitus with peripheral neuropathy. Hemoglobin A1c 6.3. Sliding scale insulin. Check blood sugars before meals and at bedtime 10. Hypertension. Norvasc 5 mg daily. 11.  Hypoxia improved, recheck chest x-ray shows pleural effusion, continue prn supplemental O2, afebrile, lung exam with reduced BS, will diurese 12.  HypoK mild will supplement and watch during diuresis, check BMET LOS (Days) 8 A FACE TO FACE EVALUATION WAS PERFORMED  KIRSTEINS,ANDREW E 01/09/2015, 8:51 AM    

## 2015-01-09 NOTE — Progress Notes (Signed)
Occupational Therapy Weekly Progress Note  Patient Details  Name: Kathryn Knapp MRN: [id number] Date of Birth: October 10, 1940  Beginning of progress report period: Jan 02, 2015 End of progress report period: Jan 09, 2015  Today's Date: 01/09/2015 OT Individual Time: 1030-1130 OT Individual Time Calculation (min): 60 min    Patient has met 4 of 4 short term goals.  Pt is making steady progress towards goals. Pt currently requires mod assist with transfers with use of slide board and has demonstrated carryover of use of lateral leans and AE to assist with LB dressing.   Patient continues to demonstrate the following deficits: decreased cardiovascular endurance, activity tolerance, strength, dynamic sitting balance and therefore will continue to benefit from skilled OT intervention to enhance overall performance with BADL and Reduce care partner burden.  Patient progressing toward long term goals..  Continue plan of care.  OT Short Term Goals Week 1:  OT Short Term Goal 1 (Week 1): Pt will complete toilet transfer to drop arm BSC with max assist of one caregiver OT Short Term Goal 1 - Progress (Week 1): Met OT Short Term Goal 2 (Week 1): Pt will complete LB dressing with max assist of one caregiver OT Short Term Goal 2 - Progress (Week 1): Met OT Short Term Goal 3 (Week 1): Pt will complete bathing with min assist OT Short Term Goal 3 - Progress (Week 1): Met OT Short Term Goal 4 (Week 1): Pt will complete toileting with max assist of one caregiver OT Short Term Goal 4 - Progress (Week 1): Met Week 2:  OT Short Term Goal 1 (Week 2): Pt will complete toilet transfer to drop arm BSC with slide board with min assist OT Short Term Goal 2 (Week 2): Pt will complete toileting tasks with lateral leans with min assist  OT Short Term Goal 3 (Week 2): Pt will complete bathing with lateral leans with supervision OT Short Term Goal 4 (Week 2): Pt will complete LB dressing with lateral leans with min  assist  Skilled Therapeutic Interventions/Progress Updates:    Engaged in therapeutic activity with focus on w/c mobility in home environment and progressing to unlevel transfers with slide board.  Pt propelled w/c to ADL apt >150 feet with 2 rest breaks.  Pt maneuvered w/c in bedroom of apt with focus on negotiating furniture and proper positioning next to bed in preparation for transfer.  Discussed bed height and unlevel surface of transfer.  Pt managed w/c parts and directed therapist through placement of slide board.  During transfer from w/c to bed pt slid forward on slide board requiring max-total assist from therapist to push hips back onto slide board and get pt securely onto bed.  Pt reports may have hit residual limb on floor during transfer.  Notified RN who removed wrapping and inspected limb with no bleeding or drainage, just some redness no more than during AM inspection.  Limb rewrapped and secured.  Reiterated goals of min assist with bed <> w/c and toilet transfers, recommending hands on during transfer and plan for significant family education as transfers can vary depending on surface and level of fatigue.  Pt reports understanding and continued anxiety about transfers.  Performed slide board transfer bed > w/c (uphill) with mod assist and improved sequencing and weight shift from pt.  Upon return to room, pt requested to return to supine in bed.  Again mod progressing to min assist with slide board transfer.  Pt left semi-reclined in bed with  all needs in reach.  Therapy Documentation Precautions:  Precautions Precautions: Fall Precaution Comments: NWB R LE Restrictions Weight Bearing Restrictions: Yes RLE Weight Bearing: Non weight bearing General:   Vital Signs: Therapy Vitals Temp: 98.5 F (36.9 C) Temp Source: Oral Pulse Rate: 80 Resp: (!) 22 BP: (!) 129/51 mmHg Patient Position (if appropriate): Lying Oxygen Therapy SpO2: 96 % O2 Device: Not Delivered Pain: Pain  Assessment Pain Assessment: 0-10 Pain Score: 8  Pain Location: Abdomen  See FIM for current functional status  Therapy/Group: Individual Therapy  Simonne Come 01/09/2015, 4:01 PM

## 2015-01-10 ENCOUNTER — Inpatient Hospital Stay (HOSPITAL_COMMUNITY): Payer: Medicare Other | Admitting: Physical Therapy

## 2015-01-10 ENCOUNTER — Inpatient Hospital Stay (HOSPITAL_COMMUNITY): Payer: Medicare Other

## 2015-01-10 ENCOUNTER — Inpatient Hospital Stay (HOSPITAL_COMMUNITY): Payer: Medicare Other | Admitting: Occupational Therapy

## 2015-01-10 LAB — CBC
HEMATOCRIT: 26.5 % — AB (ref 36.0–46.0)
Hemoglobin: 8.2 g/dL — ABNORMAL LOW (ref 12.0–15.0)
MCH: 26.6 pg (ref 26.0–34.0)
MCHC: 30.9 g/dL (ref 30.0–36.0)
MCV: 86 fL (ref 78.0–100.0)
PLATELETS: 711 10*3/uL — AB (ref 150–400)
RBC: 3.08 MIL/uL — ABNORMAL LOW (ref 3.87–5.11)
RDW: 18 % — ABNORMAL HIGH (ref 11.5–15.5)
WBC: 10 10*3/uL (ref 4.0–10.5)

## 2015-01-10 LAB — GLUCOSE, CAPILLARY
GLUCOSE-CAPILLARY: 146 mg/dL — AB (ref 65–99)
Glucose-Capillary: 110 mg/dL — ABNORMAL HIGH (ref 65–99)
Glucose-Capillary: 115 mg/dL — ABNORMAL HIGH (ref 65–99)
Glucose-Capillary: 91 mg/dL (ref 65–99)

## 2015-01-10 NOTE — Patient Care Conference (Signed)
Inpatient RehabilitationTeam Conference and Plan of Care Update Date: 01/10/2015   Time: 11;10 am    Patient Name: Kathryn Knapp      Medical Record Number: [id number]  Date of Birth: [birth year] Sex: Female         Room/Bed: 4W12C/4W12C-01 Payor Info: Payor: MEDICARE / Plan: MEDICARE PART A / Product Type: *No Product type* /    Admitting Diagnosis: R BKA AND RECENT EXPL LAP COLOSTOMY  Admit Date/Time:  01/01/2015  2:07 PM Admission Comments: No comment available   Primary Diagnosis:  Amputation of right lower extremity below knee Principal Problem: Amputation of right lower extremity below knee  Patient Active Problem List   Diagnosis Date Noted  . Severe muscle deconditioning 01/11/2015  . Amputation of right lower extremity below knee 01/01/2015  . S/P exploratory laparotomy   . S/P unilateral BKA (below knee amputation)   . Abdominal pain   . Pain in joint, lower leg   . Palliative care encounter   . Acute respiratory failure with hypoxia   . Thrombus   . Acute renal failure syndrome   . Peritonitis   . Colon cancer metastasized to multiple sites   . Acute blood loss anemia   . Melena   . Diabetes type 2, controlled   . Hypokalemia   . Hypomagnesemia   . Depression   . Noncompliance with medication regimen 12/23/2014  . S/P partial colectomy 12/17/2014  . Severe sepsis with acute organ dysfunction 12/17/2014  . Septic shock   . Bowel obstruction 12/16/2014  . Colonic mass 12/16/2014    Expected Discharge Date: Expected Discharge Date: 01/19/15  Team Members Present: Physician leading conference: Dr. Alysia Penna Social Worker Present: Ovidio Kin, LCSW Nurse Present: Heather Roberts, RN PT Present: Raylene Everts, PT;Rodney Annie Main, PT OT Present: Meriel Pica, OT;Sarah Hoxie, OT;Jennifer Tamala Julian, OT SLP Present: Windell Moulding, SLP PPS Coordinator present : Daiva Nakayama, RN, CRRN     Current Status/Progress Goal Weekly Team Focus  Medical   Poor  endurance, hypoxia resolved, adjustment disorder, not depression after evaluation from neuropsych  Maintain medical stability for home discharge  Increase ability to self manage BKA stump   Bowel/Bladder   Continent urine, Illeostomy patent  Manage illeostomy with min. assist.  Educate re: care.   Swallow/Nutrition/ Hydration     NA        ADL's   mod assist transfers with slide board, max assist toileting, mod assist LB dressing.  Focus on lateral leans for LB self-care tasks at this time  supervision overall, min assist transfers  transfers, residual limb care, dynamic sitting balance, lateral leans   Mobility   min to mod assist with slide board transfers, supervision wheelchair mobility over level surfaces. mod assist residual llimb care.   supervisiion to min assist  transfers, resdiual limb care, wheelchair management and mobility, standing balance   Communication     NA        Safety/Cognition/ Behavioral Observations  No unsafe behaviors, anticipates needs; mild anxiety at times.  No falls, injury this admission.  Calls, anticipates needs  Monitor   Pain   Left shoulder pain, right BKA incisional pain, phantom pain; Percocet, 2 tabs. 3-4/day.  managed at goal 2/10  Offer prns, continue education r/t alternante methods of pain control.   Skin   Abd. incision with pink tissue, yellow at base. minimal yellowish drainage, no odor. SHearing injuy buttocks healing; BKA incision C.D.I.. with staples.  Healing process at discharge.  No s/s infection.  Monitor, continue current plan of care      *See Care Plan and progress notes for long and short-term goals.  Barriers to Discharge: Multiple medical issues, decreased endurance    Possible Resolutions to Barriers:  Continue rehabilitation,  continue education    Discharge Planning/Teaching Needs:  Home with daughter and grnad-children have been here to observe in therapies. Pt beginning to learn colostomy care      Team Discussion:   Making progress in therapies-shoulder pain-x-ray ordered. Urgency form lasix using bedpan takes too long to use bedside commode. Learning stump wrapping and ileostomy care.  Decreasing lasix does. Phantom pain being managed.  Need for family education next week  Revisions to Treatment Plan:  None   Continued Need for Acute Rehabilitation Level of Care: The patient requires daily medical management by a physician with specialized training in physical medicine and rehabilitation for the following conditions: Daily direction of a multidisciplinary physical rehabilitation program to ensure safe treatment while eliciting the highest outcome that is of practical value to the patient.: Yes Daily medical management of patient stability for increased activity during participation in an intensive rehabilitation regime.: Yes Daily analysis of laboratory values and/or radiology reports with any subsequent need for medication adjustment of medical intervention for : Other;Pulmonary problems  Edger Husain, Gardiner Rhyme 01/12/2015, 8:56 AM

## 2015-01-10 NOTE — Progress Notes (Signed)
Subjective/Complaints: Left shoulder pain, no trauma, points to left clavicle. Also has some left-sided hip and flank pain. No burning with urination ROS  Negative except for midline abdominal pain,   Objective: Vital Signs: Blood pressure 144/68, pulse 88, temperature 97.7 F (36.5 C), temperature source Oral, resp. rate 16, height $RemoveBe'5\' 11"'SZRXbKxEO$  (1.803 m), weight 82.2 kg (181 lb 3.5 oz), SpO2 95 %. No results found. Results for orders placed or performed during the hospital encounter of 01/01/15 (from the past 72 hour(s))  Glucose, capillary     Status: Abnormal   Collection Time: 01/07/15 12:10 PM  Result Value Ref Range   Glucose-Capillary 100 (H) 65 - 99 mg/dL  Glucose, capillary     Status: Abnormal   Collection Time: 01/07/15  4:49 PM  Result Value Ref Range   Glucose-Capillary 108 (H) 65 - 99 mg/dL  Glucose, capillary     Status: Abnormal   Collection Time: 01/07/15  8:58 PM  Result Value Ref Range   Glucose-Capillary 108 (H) 65 - 99 mg/dL  Creatinine, serum     Status: None   Collection Time: 01/08/15  6:18 AM  Result Value Ref Range   Creatinine, Ser 0.78 0.44 - 1.00 mg/dL   GFR calc non Af Amer >60 >60 mL/min   GFR calc Af Amer >60 >60 mL/min    Comment: (NOTE) The eGFR has been calculated using the CKD EPI equation. This calculation has not been validated in all clinical situations. eGFR's persistently <60 mL/min signify possible Chronic Kidney Disease.   CBC     Status: Abnormal   Collection Time: 01/08/15  6:18 AM  Result Value Ref Range   WBC 8.5 4.0 - 10.5 K/uL   RBC 3.11 (L) 3.87 - 5.11 MIL/uL   Hemoglobin 8.1 (L) 12.0 - 15.0 g/dL   HCT 26.6 (L) 36.0 - 46.0 %   MCV 85.5 78.0 - 100.0 fL   MCH 26.0 26.0 - 34.0 pg   MCHC 30.5 30.0 - 36.0 g/dL   RDW 18.0 (H) 11.5 - 15.5 %   Platelets 518 (H) 150 - 400 K/uL  Glucose, capillary     Status: None   Collection Time: 01/08/15  7:03 AM  Result Value Ref Range   Glucose-Capillary 85 65 - 99 mg/dL  Glucose,  capillary     Status: Abnormal   Collection Time: 01/08/15 11:15 AM  Result Value Ref Range   Glucose-Capillary 118 (H) 65 - 99 mg/dL  Glucose, capillary     Status: Abnormal   Collection Time: 01/08/15  4:38 PM  Result Value Ref Range   Glucose-Capillary 108 (H) 65 - 99 mg/dL  Glucose, capillary     Status: Abnormal   Collection Time: 01/08/15  9:29 PM  Result Value Ref Range   Glucose-Capillary 101 (H) 65 - 99 mg/dL  Glucose, capillary     Status: None   Collection Time: 01/09/15  6:57 AM  Result Value Ref Range   Glucose-Capillary 93 65 - 99 mg/dL  Glucose, capillary     Status: Abnormal   Collection Time: 01/09/15 11:49 AM  Result Value Ref Range   Glucose-Capillary 112 (H) 65 - 99 mg/dL   Comment 1 Notify RN   Glucose, capillary     Status: None   Collection Time: 01/09/15  4:32 PM  Result Value Ref Range   Glucose-Capillary 99 65 - 99 mg/dL   Comment 1 Notify RN   Glucose, capillary     Status: Abnormal  Collection Time: 01/09/15  8:58 PM  Result Value Ref Range   Glucose-Capillary 111 (H) 65 - 99 mg/dL  CBC     Status: Abnormal   Collection Time: 01/10/15  5:23 AM  Result Value Ref Range   WBC 10.0 4.0 - 10.5 K/uL   RBC 3.08 (L) 3.87 - 5.11 MIL/uL   Hemoglobin 8.2 (L) 12.0 - 15.0 g/dL   HCT 26.5 (L) 36.0 - 46.0 %   MCV 86.0 78.0 - 100.0 fL   MCH 26.6 26.0 - 34.0 pg   MCHC 30.9 30.0 - 36.0 g/dL   RDW 18.0 (H) 11.5 - 15.5 %   Platelets 711 (H) 150 - 400 K/uL  Glucose, capillary     Status: Abnormal   Collection Time: 01/10/15  6:42 AM  Result Value Ref Range   Glucose-Capillary 110 (H) 65 - 99 mg/dL     HEENT: poor dentition Cardio: RRR and tachy Resp: CTA B/L and unlabored GI: BS positive and midline incision, good granulation tissue,, wet to dry dressingsRLQ colostomy Extremity:  Edema Right stump Skin:   Wound C/D/I Neuro: Alert/Oriented, Anxious and Abnormal Motor 4/5 bilateral deltoids, biceps, triceps, grip, 4/5 right hip flexor, 4/5 left hip flexor  knee extensor ankle dorsiflexor Musc/Skel: mild tenderness left clavicle and AC joint area, no pain with hip range of motion on the left side and no tenderness palpation of left greater trochanter Gen. no acute distress   Assessment/Plan: 1. 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 Physiatrist is providing close team supervision and 24 hour management of active medical problems listed below. Physiatrist and rehab team continue to assess barriers to discharge/monitor patient progress toward functional and medical goals. Team conference today please see physician documentation under team conference tab, met with team face-to-face to discuss problems,progress, and goals. Formulized individual treatment plan based on medical history, underlying problem and comorbidities. FIM: FIM - Bathing Bathing Steps Patient Completed: Chest, Right Arm, Left Arm, Abdomen, Right upper leg, Left upper leg, Front perineal area Bathing: 3: Mod-Patient completes 5-7 41f 10 parts or 50-74%  FIM - Upper Body Dressing/Undressing Upper body dressing/undressing steps patient completed: Thread/unthread right sleeve of pullover shirt/dresss, Thread/unthread left sleeve of pullover shirt/dress, Put head through opening of pull over shirt/dress, Pull shirt over trunk Upper body dressing/undressing: 5: Set-up assist to: Obtain clothing/put away FIM - Lower Body Dressing/Undressing Lower body dressing/undressing steps patient completed: Thread/unthread right pants leg, Thread/unthread left pants leg, Pull pants up/down, Don/Doff left sock Lower body dressing/undressing: 4: Steadying Assist  FIM - Toileting Toileting steps completed by patient: Performs perineal hygiene Toileting: 2: Max-Patient completed 1 of 3 steps  FIM - Sport and exercise psychologist Devices: Bedside commode, Sliding board Toilet Transfers: 2-To toilet/BSC: Max A (lift and lower assist), 2-From toilet/BSC: Max A (lift and lower assist), 1-Two helpers  FIM - Control and instrumentation engineer Devices: Sliding board, Arm rests Bed/Chair Transfer: 5: Supine > Sit: Supervision (verbal cues/safety issues), 5: Sit > Supine: Supervision (verbal cues/safety issues), 3: Bed > Chair or W/C: Mod A (lift or lower assist)  FIM - Locomotion: Wheelchair Distance: 150 Locomotion: Wheelchair: 5: Travels 150 ft or more: maneuvers on rugs and over door sills with supervision, cueing or coaxing FIM - Locomotion: Ambulation Ambulation/Gait Assistance: Not tested (comment) (not safe at this time) Locomotion: Ambulation: 0: Activity did not  occur  Comprehension Comprehension Mode: Auditory Comprehension: 6-Follows complex conversation/direction: With extra time/assistive device  Expression Expression Mode: Verbal Expression: 6-Expresses complex ideas: With extra time/assistive device  Social Interaction Social Interaction: 6-Interacts appropriately with others with medication or extra time (anti-anxiety, antidepressant).  Problem Solving Problem Solving: 5-Solves complex 90% of the time/cues < 10% of the time  Memory Memory: 5-Recognizes or recalls 90% of the time/requires cueing < 10% of the time  Medical Problem List and Plan: 1. 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  DVT Prophylaxis/Anticoagulation: Subcutaneous Lovenox. Monitor platelet counts and any signs of bleeding,discussed with hematology.  Hgb stable Dr Beryle Beams who recommends full dose anticoag with lovenox without conversion to warfarin given active metastatic CA, for at least 6 mo, given wt loss (?BKA)  will reduce dose to $Remov'120mg'ZXhzDg$  q24h                3. Pain Management: Neurontin 100 mg 3 times a day, Oxycodone. Monitor with increased mobility,left clavicular/shoulder pain with some tenderness, we'll check x-ray given history of metastatic adenocarcinoma 4. Mood/depression: Prozac 20 mg daily. 5. Neuropsych: This patient is capable of making decisions on her own behalf. 6. Skin/Wound Care: Local care to right BKA site as well as abdominal wound/ostomy and deep tissue injury to buttocks and sacrum.  WOC RN following for wound care. Has had some issues with seal of ostomy 7. Fluids/Electrolytes/Nutrition: Routine  I/Os  with follow-up chemistries, prob fluid overload will gently diurese 8. Acute blood loss anemia. Follow-up CBC tonight 9. Diabetes mellitus with peripheral neuropathy. Hemoglobin A1c 6.3. Sliding scale insulin. Check blood sugars before meals and at bedtime 10. Hypertension. Norvasc 5 mg daily. 11.  Hypoxia improved, recheck chest x-ray shows pleural effusion, continue prn supplemental O2, afebrile, lung exam with reduced BS, will diurese 12.  HypoK mild will supplement and watch during diuresis, check BMET LOS (Days) Coleman E 01/10/2015, 9:28 AM

## 2015-01-10 NOTE — Consult Note (Signed)
  INITIAL DIAGNOSTIC EVALUATION - [name]   MEDICAL NECESSITY:  [name] was seen on the [facility] for an initial diagnostic evaluation owing to the patient's diagnosis of right BKA.   According to medical records, 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 right handed female with history of hypertension as well as diabetes mellitus. Independent prior to admission living with her daughter and grandchildren. Presented 12/16/2014 with nonspecific abdominal pain as well as nausea vomiting. He also had some recent weight loss. CT abdomen revealed a mass in the splenic flexure as well as nodule left adrenal gland.follow-up scan revealed some free fluid and air. Underwent exploratory laparotomy right transverse and partial left colectomy with en block partial gastrectomy, and ileostomy 12/17/2014 per Dr. Grandville Silos. Pathology report adenocarcinoma with metastases and await plan of care to be established as outpatient with oncology services."   During today's visit, [name] denied suffering from any cognitive difficulties. She has felt discouraged at times regarding her present situation, though she has generally been in good spirits. There is no history of mental health issues or treatment. No adjustment issues endorsed. No barriers to therapy identified. Suicidal/homicidal ideation, plan or intent was denied. No manic or hypomanic episodes were reported. The patient denied ever experiencing any auditory/visual hallucinations. No major behavioral or personality changes were endorsed.    [name] feels that she has made progress in therapy. No barriers to therapy identified. She described the rehab  staff as "very good." She has ample support via friends, family and the community. She lives with her daughter and her daughter's four children who can reportedly assist with her transition home.   PROCEDURES ADMINISTERED: [1 unit 90791] Diagnostic clinical interview  Review of available records   SUMMARY & IMPRESSION: Overall, [name] is reportedly adjusting as well as would be expected given her unfortunate medical situation. There are no reported (or observed) signs of cognitive impairment or significant clinical psychopathology. At this time, no formal follow-up with neuropsychology is warranted. However, [name] was encouraged to call upon our service if she notes worsening depression/anxiety or new onset cognitive difficulty.    Rutha Bouchard, Psy.D.  Clinical Neuropsychologist

## 2015-01-10 NOTE — Progress Notes (Signed)
ANTICOAGULATION CONSULT NOTE - Follow Up Consult  Pharmacy Consult for lovenox Indication: atrial fibrillation  No Known Allergies  Patient Measurements: Height: 5\' 11"  (180.3 cm) Weight: 181 lb 3.5 oz (82.2 kg) IBW/kg (Calculated) : 70.8 Heparin Dosing Weight:   Vital Signs: Temp: 97.7 F (36.5 C) (05/25 0430) Temp Source: Oral (05/25 0430) BP: 144/68 mmHg (05/25 0430) Pulse Rate: 88 (05/25 0430)  Labs:  Recent Labs  01/08/15 0618 01/10/15 0523  HGB 8.1* 8.2*  HCT 26.6* 26.5*  PLT 518* 711*  CREATININE 0.78  --     Estimated Creatinine Clearance: 70 mL/min (by C-G formula based on Cr of 0.78).   Medications:  Scheduled:  . amLODipine  5 mg Oral Daily  . docusate sodium  100 mg Oral Daily  . enoxaparin (LOVENOX) injection  120 mg Subcutaneous Q24H  . feeding supplement (ENSURE ENLIVE)  237 mL Oral BID BM  . FLUoxetine  20 mg Oral Daily  . gabapentin  100 mg Oral TID  . insulin aspart  0-9 Units Subcutaneous TID WC & HS  . pantoprazole  40 mg Oral Daily   Infusions:    Assessment: 74 yo female with hx of afib and malignancy is currently on treatment dose of lovenox.  Hgb 8.2 and Plt 711 K stable.  Goal of Therapy:  Anti-Xa level 0.6-1 units/ml 4hrs after LMWH dose given Monitor platelets by anticoagulation protocol: Yes   Plan:  - continue lovenox 120 mg sq q24h - CBC every 72 hours  Eron Goble, Tsz-Yin 01/10/2015,8:17 AM

## 2015-01-10 NOTE — Progress Notes (Signed)
Physical Therapy Session Note  Patient Details  Name: Kathryn Knapp MRN: [id number] Date of Birth: 06/18/1941  Today's Date: 01/10/2015 PT Individual Time: 0903-1031 PT Individual Time Calculation (min): 88 min   Short Term Goals: Week 2:  PT Short Term Goal 1 (Week 2): Patient will perform bed mobility with stand by assistance  PT Short Term Goal 2 (Week 2): Patient will perform slide board transfer with min assist to a level surface PT Short Term Goal 3 (Week 2): Patient will stand with mod assist for 2 minutes with least restrictive assistive device.  PT Short Term Goal 4 (Week 2): Patient will propel wheelchair 150 feet over level surfaces with close supervision  PT Short Term Goal 5 (Week 2): Patient will be able to educate and perform residual limb care with no more than minimal assistance for insepection and residual limb wrapping.   Skilled Therapeutic Interventions/Progress Updates:      Therapy Documentation Precautions:  Precautions Precautions: Fall Precaution Comments: NWB R LE Restrictions Weight Bearing Restrictions: Yes RLE Weight Bearing: Non weight bearing    Pain: Pain Assessment Pain Score: 6  Pain Type: Acute pain Pain Location: Shoulder Pain Orientation: Left Pain Descriptors / Indicators: Burning;Cramping;Sore  Patient propelled wheelchair 150 feetx2 mod I over level tile surface in a controlled environment with minimal distractions.  Patient propelled wheelchair 100 feetx3 over outdoor uneven and unlevel surfaces close supervision and multiple instances of min assist  for negotiating obstacles, curb cut outs and inclines. Patient with one episode of total assist when negotiating a decline when patient became anxious and took both hands off of the rims of the wheelchair. Patient educated on safety, proper technique for ascending and descending inclines and declines. Patient verbalized understanding.   Patient transferred to and from mat to wheelchair mod  assist for set-up and min assist for transfer.   Patient performed right residual limb care, inspection and shaping with use of ACE wrap. Review education, process and technique for residual limb inspection with long handled mirror. Patient provided ACE wraps to perform residual limb wrapping and shaping. Patient required mod cues and assistance for proper technique. Two trials were performed in order to assist with decreasing sensitivity and improve carry-over of technique.   Sit to and from stand transfer max assist  Patient stood for 90 seconds with min to mod assist for balance and upright posture NWB right lower extremity.   Patient returned to room at end of session resting in wheelchair with all needs met and call bell and phone within reach. Vitals monitored and remained stable throughout session and responded appropriately with activity. Session focused on residual limb care and wheelchair management and mobility.   Therapy/Group: Individual Therapy  [name] 01/10/2015, 3:54 PM

## 2015-01-10 NOTE — Progress Notes (Signed)
Occupational Therapy Session Note  Patient Details  Name: Kathryn Knapp MRN: [id number] Date of Birth: 07/04/1941  Today's Date: 01/10/2015 OT Individual Time: 2330-0762 OT Individual Time Calculation (min): 60 min    Short Term Goals: Week 2:  OT Short Term Goal 1 (Week 2): Pt will complete toilet transfer to drop arm BSC with slide board with min assist OT Short Term Goal 2 (Week 2): Pt will complete toileting tasks with lateral leans with min assist  OT Short Term Goal 3 (Week 2): Pt will complete bathing with lateral leans with supervision OT Short Term Goal 4 (Week 2): Pt will complete LB dressing with lateral leans with min assist  Skilled Therapeutic Interventions/Progress Updates:    1) Engaged in ADL retraining with focus on bed mobility, lateral leans, limb wrapping, and transfers.  Pt performed bed mobility  And up to EOB with use of bed rail with supervision.  Completed bathing and dressing seated at EOB with focus on carryover of education with lateral leans with pt able to complete LB bathing and dressing without use of AE this session.  Reiterated use of skin inspection mirror and had pt remove wrapping on residual limb, complete inspection, and then direct limb wrapping. Therapist completed figure 8 ACE wrapping according to pt direction and had pt assess wrapping.  Completed slide board transfer bed > w/c with mod assist for initial weight shift followed by min assist for rest of transfer.  Set pt up with breakfast tray and encouraged to sit up in w/c till next session.  2) Engaged in therapeutic activity with focus on anterior weight shift as needed for transfers and sit > stand.  Engaged in slide board transfer to drop arm BSC with cues for pt to direct board placement.  Tactile cues to promote forward weight shift while maintaining proper positioning on slide board.  Lateral leans to adjust skirt in preparation for toileting, pt able to complete all 3 steps of toileting with  encouragement and steady assist.  Discussed setup of equipment in home environment to problem solve efficiency of transfers.  Amputee from peer support group arrived during session and attended session with encouragement and discussion of her process and encouraged questions from pt.  W/c propulsion with focus on symmetrical propulsion and setup of w/c parts in preparation for transfer.  Sit > partial stand with focus on anterior weight shift as needed for clothing management, transfers, and eventual sit > stand.  Slide board transfer w/c <> therapy mat with min-mod assist.  Encouraged pt to remain up in w/c to visit with family member and peer amputee.  Therapy Documentation Precautions:  Precautions Precautions: Fall Precaution Comments: NWB R LE Restrictions Weight Bearing Restrictions: Yes RLE Weight Bearing: Non weight bearing Pain: Pain Assessment Pain Score: Asleep  See FIM for current functional status  Therapy/Group: Individual Therapy  Simonne Come 01/10/2015, 9:05 AM

## 2015-01-11 ENCOUNTER — Inpatient Hospital Stay (HOSPITAL_COMMUNITY): Payer: Medicare Other | Admitting: Occupational Therapy

## 2015-01-11 ENCOUNTER — Encounter (HOSPITAL_COMMUNITY): Payer: Self-pay

## 2015-01-11 ENCOUNTER — Inpatient Hospital Stay (HOSPITAL_COMMUNITY): Payer: Medicare Other | Admitting: Physical Therapy

## 2015-01-11 DIAGNOSIS — R29898 Other symptoms and signs involving the musculoskeletal system: Secondary | ICD-10-CM

## 2015-01-11 LAB — GLUCOSE, CAPILLARY
GLUCOSE-CAPILLARY: 82 mg/dL (ref 65–99)
GLUCOSE-CAPILLARY: 93 mg/dL (ref 65–99)
GLUCOSE-CAPILLARY: 106 mg/dL — AB (ref 65–99)
Glucose-Capillary: 112 mg/dL — ABNORMAL HIGH (ref 65–99)

## 2015-01-11 NOTE — Progress Notes (Signed)
Occupational Therapy Session Note  Patient Details  Name: CAMYA HAYDON MRN: [id number] Date of Birth: 03-11-1941  Today's Date: 01/11/2015 OT Individual Time: 0835-1000 and 1400-1500 OT Individual Time Calculation (min): 85 min and 60 min   Short Term Goals: Week 2:  OT Short Term Goal 1 (Week 2): Pt will complete toilet transfer to drop arm BSC with slide board with min assist OT Short Term Goal 2 (Week 2): Pt will complete toileting tasks with lateral leans with min assist  OT Short Term Goal 3 (Week 2): Pt will complete bathing with lateral leans with supervision OT Short Term Goal 4 (Week 2): Pt will complete LB dressing with lateral leans with min assist  Skilled Therapeutic Interventions/Progress Updates:    1) Engaged in ADL retraining with focus on lateral leans for self-care tasks, toilet transfers, and LB dressing.  Educated on wrapping of residual limb.  Pt reports pain in stomach and nausea but willing to engage in session as able.  Completed bathing at bed level progressing to EOB, with pt washing perineal area at bed level and rest of body while seated EOB.  Donned shorts seated at EOB and pulled over hips with combination lateral lean and rolling in bed.  Transfer to drop arm BSC with slide board with mod assist for lifting initially then progressing to min/steady assist.  Pt required assist to doff and don pants with lateral leans from [facility].  Returned to EOB and engaged in education on limb wrapping.  Completed wrapping x3 with mod cues and assist for proper technique.  Transfer bed > w/c with slide board with min assist and assist for proper positioning.  W/c mobility in room with focus on positioning and parts management.  Encouraged to remain seated upright until next therapy session.  2) Engaged in therapeutic activity with focus on bed mobility, transfers, and functional mobility in home environment.  Pt in bed upon arrival reports urgent need to urinate therefore completing  rolling at bed level to adjust shorts in preparation for use of bedpan.  Pt able to complete clothing management prior to voiding but then required assist to pull shorts up after void at bed level.  Slide board transfer bed > w/c with assist to position slide board and then mod assist to w/c.  In hospital solarium, engaged in w/c mobility and obstacle negotiation with discussion of mobility in home environment with maneuvering between furniture, turning around in tight spaces, and backing up when unable to turn around.  Pt returned to bed at end of session via slide board transfer with min assist.  Therapy Documentation Precautions:  Precautions Precautions: Fall Precaution Comments: NWB R LE Restrictions Weight Bearing Restrictions: Yes RLE Weight Bearing: Non weight bearing Pain:  Pt with c/o pain in Lt shoulder and stomach, RN aware and providing meds upon arrival.  See FIM for current functional status  Therapy/Group: Individual Therapy  Simonne Come 01/11/2015, 11:10 AM

## 2015-01-11 NOTE — Progress Notes (Signed)
Subjective/Complaints: Still with left shoulder pain however today's pointing more toward the trapezius area, no new trauma. X-rays reviewed  ROS  Negative except for midline abdominal pain,   Objective: Vital Signs: Blood pressure 127/56, pulse 80, temperature 98.8 F (37.1 C), temperature source Oral, resp. rate 18, height 5\' 11"  (1.803 m), weight 81 kg (178 lb 9.2 oz), SpO2 97 %. Dg Shoulder Left  01/10/2015   CLINICAL DATA:  Nontraumatic left shoulder pain, initial encounter  EXAM: LEFT SHOULDER - 2+ VIEW  COMPARISON:  Chest radiograph 01/02/2015, chest CT 12/17/2014  FINDINGS: There is no evidence of fracture or dislocation. There is no evidence of arthropathy or other focal bone abnormality. Soft tissues are unremarkable. Aortic ectasia and soft tissue left suprahilar fullness reidentified, incompletely imaged, but likely corresponding to vascular pedicle as seen on the dedicated prior exam 12/17/2014.  IMPRESSION: Negative.   Electronically Signed   By: Conchita Paris M.D.   On: 01/10/2015 12:18   Results for orders placed or performed during the hospital encounter of 01/01/15 (from the past 72 hour(s))  Glucose, capillary     Status: Abnormal   Collection Time: 01/08/15  4:38 PM  Result Value Ref Range   Glucose-Capillary 108 (H) 65 - 99 mg/dL  Glucose, capillary     Status: Abnormal   Collection Time: 01/08/15  9:29 PM  Result Value Ref Range   Glucose-Capillary 101 (H) 65 - 99 mg/dL  Glucose, capillary     Status: None   Collection Time: 01/09/15  6:57 AM  Result Value Ref Range   Glucose-Capillary 93 65 - 99 mg/dL  Glucose, capillary     Status: Abnormal   Collection Time: 01/09/15 11:49 AM  Result Value Ref Range   Glucose-Capillary 112 (H) 65 - 99 mg/dL   Comment 1 Notify RN   Glucose, capillary     Status: None   Collection Time: 01/09/15  4:32 PM  Result Value Ref Range   Glucose-Capillary 99 65 - 99 mg/dL   Comment 1 Notify RN   Glucose, capillary      Status: Abnormal   Collection Time: 01/09/15  8:58 PM  Result Value Ref Range   Glucose-Capillary 111 (H) 65 - 99 mg/dL  CBC     Status: Abnormal   Collection Time: 01/10/15  5:23 AM  Result Value Ref Range   WBC 10.0 4.0 - 10.5 K/uL   RBC 3.08 (L) 3.87 - 5.11 MIL/uL   Hemoglobin 8.2 (L) 12.0 - 15.0 g/dL   HCT 26.5 (L) 36.0 - 46.0 %   MCV 86.0 78.0 - 100.0 fL   MCH 26.6 26.0 - 34.0 pg   MCHC 30.9 30.0 - 36.0 g/dL   RDW 18.0 (H) 11.5 - 15.5 %   Platelets 711 (H) 150 - 400 K/uL  Glucose, capillary     Status: Abnormal   Collection Time: 01/10/15  6:42 AM  Result Value Ref Range   Glucose-Capillary 110 (H) 65 - 99 mg/dL  Glucose, capillary     Status: Abnormal   Collection Time: 01/10/15 11:54 AM  Result Value Ref Range   Glucose-Capillary 115 (H) 65 - 99 mg/dL  Glucose, capillary     Status: Abnormal   Collection Time: 01/10/15  4:28 PM  Result Value Ref Range   Glucose-Capillary 146 (H) 65 - 99 mg/dL   Comment 1 Notify RN   Glucose, capillary     Status: None   Collection Time: 01/10/15  9:18 PM  Result  Value Ref Range   Glucose-Capillary 91 65 - 99 mg/dL  Glucose, capillary     Status: None   Collection Time: 01/11/15  6:48 AM  Result Value Ref Range   Glucose-Capillary 82 65 - 99 mg/dL  Glucose, capillary     Status: Abnormal   Collection Time: 01/11/15 12:13 PM  Result Value Ref Range   Glucose-Capillary 112 (H) 65 - 99 mg/dL   Comment 1 Notify RN      HEENT: poor dentition Cardio: RRR and tachy Resp: CTA B/L and unlabored GI: BS positive and midline incision, good granulation tissue,, wet to dry dressingsRLQ colostomy Extremity:  Edema Right stump Skin:   Wound C/D/I Neuro: Alert/Oriented, Anxious and Abnormal Motor 4/5 bilateral deltoids, biceps, triceps, grip, 4/5 right hip flexor, 4/5 left hip flexor knee extensor ankle dorsiflexor Musc/Skel: mild tenderness left clavicle and AC joint area, no pain with hip range of motion on the left side and no tenderness  palpation of left greater trochanter Gen. no acute distress   Assessment/Plan: 1. 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 Physiatrist is providing close team supervision and 24 hour management of active medical problems listed below. Physiatrist and rehab team continue to assess barriers to discharge/monitor patient progress toward functional and medical goals. Recheck bmet monitor K post diuresis FIM: FIM - Bathing Bathing Steps Patient Completed: Chest, Right Arm, Left Arm, Abdomen, Right upper leg, Left upper leg, Front perineal area Bathing: 3: Mod-Patient completes 5-7 65f 10 parts or 50-74%  FIM - Upper Body Dressing/Undressing Upper body dressing/undressing steps patient completed: Thread/unthread right sleeve of pullover shirt/dresss, Thread/unthread left sleeve of pullover shirt/dress, Put head through opening of pull over shirt/dress, Pull shirt over trunk Upper body dressing/undressing: 5: Set-up assist to: Obtain clothing/put away FIM - Lower Body Dressing/Undressing Lower body dressing/undressing steps patient completed: Thread/unthread right pants leg, Thread/unthread left pants leg, Pull pants up/down, Don/Doff left sock Lower body dressing/undressing: 4: Steadying Assist  FIM - Toileting Toileting steps completed by patient: Performs perineal hygiene Toileting: 2: Max-Patient completed 1 of 3 steps  FIM - Radio producer Devices: Bedside commode, Sliding board Toilet Transfers: 3-To toilet/BSC: Mod A (lift or lower assist), 3-From toilet/BSC: Mod A (lift or lower assist)  FIM - Control and instrumentation engineer Devices: Sliding board, Arm rests Bed/Chair Transfer: 6: Supine > Sit: No assist, 5: Sit > Supine: Supervision (verbal cues/safety issues), 3: Bed  > Chair or W/C: Mod A (lift or lower assist), 4: Chair or W/C > Bed: Min A (steadying Pt. > 75%)  FIM - Locomotion: Wheelchair Distance: 150 Locomotion: Wheelchair: 6: Travels 150 ft or more, turns around, maneuvers to table, bed or toilet, negotiates 3% grade: maneuvers on rugs and over door sills independently FIM - Locomotion: Ambulation Ambulation/Gait Assistance: Not tested (comment) (not safe at this time) Locomotion: Ambulation: 0: Activity did not occur  Comprehension Comprehension Mode: Auditory Comprehension: 6-Follows complex conversation/direction: With extra time/assistive device  Expression Expression Mode: Verbal Expression: 6-Expresses complex ideas: With extra time/assistive device  Social Interaction Social Interaction: 6-Interacts appropriately with others with medication or extra time (anti-anxiety, antidepressant).  Problem Solving Problem Solving: 5-Solves complex 90% of the time/cues < 10% of the time  Memory Memory: 5-Recognizes or recalls 90% of the time/requires cueing < 10% of the time  Medical Problem List and Plan: 1.  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  DVT Prophylaxis/Anticoagulation: Subcutaneous Lovenox. Monitor platelet counts and any signs of bleeding,discussed with hematology.  Hgb stable Dr Beryle Beams who recommends full dose anticoag with lovenox without conversion to warfarin given active metastatic CA, for at least 6 mo, given wt loss (?BKA) will reduce dose to 120mg  q24h                3. Pain Management: Neurontin 100 mg 3 times a day, Oxycodone. Monitor with increased mobility,left clavicular/shoulder pain with some tenderness, we'll check x-ray given history of metastatic adenocarcinoma 4. Mood/depression: Prozac 20 mg daily. 5. Neuropsych: This patient is  capable of making decisions on her own behalf. 6. Skin/Wound Care: Local care to right BKA site as well as abdominal wound/ostomy and deep tissue injury to buttocks and sacrum.  WOC RN following for wound care. Has had some issues with seal of ostomy 7. Fluids/Electrolytes/Nutrition: Routine  I/Os  with follow-up chemistries, prob fluid overload will gently diurese 8. Acute blood loss anemia. Follow-up CBC tonight 9. Diabetes mellitus with peripheral neuropathy. Hemoglobin A1c 6.3. Sliding scale insulin. Check blood sugars before meals and at bedtime 10. Hypertension. Norvasc 5 mg daily. 11.  Hypoxia improved,monitor her for desaturation in therapy  LOS (Days) 10 A FACE TO FACE EVALUATION WAS PERFORMED  Kathryn Knapp E 01/11/2015, 4:21 PM

## 2015-01-11 NOTE — Progress Notes (Signed)
Physical Therapy Session Note  Patient Details  Name: Kathryn Knapp MRN: [id number] Date of Birth: 30-Aug-1940  Today's Date: 01/11/2015 PT Individual Time: 1100-1200 PT Individual Time Calculation (min): 60 min   Short Term Goals: Week 2:  PT Short Term Goal 1 (Week 2): Patient will perform bed mobility with stand by assistance  PT Short Term Goal 2 (Week 2): Patient will perform slide board transfer with min assist to a level surface PT Short Term Goal 3 (Week 2): Patient will stand with mod assist for 2 minutes with least restrictive assistive device.  PT Short Term Goal 4 (Week 2): Patient will propel wheelchair 150 feet over level surfaces with close supervision  PT Short Term Goal 5 (Week 2): Patient will be able to educate and perform residual limb care with no more than minimal assistance for insepection and residual limb wrapping.   Skilled Therapeutic Interventions/Progress Updates:      Therapy Documentation Precautions:  Precautions Precautions: Fall Precaution Comments: NWB R LE Restrictions Weight Bearing Restrictions: Yes RLE Weight Bearing: Non weight bearing  Patient propelled wheelchair 150 feetx2 mod I over level tile surface in a controlled environment with minimal distractions.  Patient performed car transfer with slide board mod assist. Patient with increased anxiety performing car transfer and increased time was required. Patient was able to be re-directed and reassured during transfer.   Patient performed right residual limb care, inspection and shaping with use of ACE wrap. Review education, process and technique for residual limb inspection with long handled mirror. Patient provided ACE wraps to perform residual limb wrapping and shaping. Patient required mod cues and assistance for proper technique. Patient continues to demonstrate difficulty with residual limb distally.   Seated there ex: Right quad sets 40x with a 5 second hold Left hip flexion 40x with  a 3 pound cuff weight Left LAQ 40x with a 3 pound cuff weight   Sit to and from stand transfer mod assist  Patient stood for 60 seconds with min  assist for balance and upright posture NWB right lower extremity.   Patient transferred from wheelchair to bed min assist for set-up and min assist for transfer. Patient demonstrates improvements with technique and sequence.   Short sit to supine in bed close supervision with use of bedrail.  Vitals monitored and remained stable throughout session and responded appropriately with activity. Patient resting in bed at end of session with call bell and phone in reach and all needs met.    Therapy/Group: Individual Therapy  [name] 01/11/2015, 4:21 PM

## 2015-01-12 ENCOUNTER — Inpatient Hospital Stay (HOSPITAL_COMMUNITY): Payer: Medicare Other | Admitting: Occupational Therapy

## 2015-01-12 ENCOUNTER — Ambulatory Visit: Payer: Medicare Other

## 2015-01-12 ENCOUNTER — Inpatient Hospital Stay (HOSPITAL_COMMUNITY): Payer: Medicare Other | Admitting: Physical Therapy

## 2015-01-12 ENCOUNTER — Inpatient Hospital Stay: Payer: Medicare Other | Admitting: Hematology

## 2015-01-12 LAB — GLUCOSE, CAPILLARY
GLUCOSE-CAPILLARY: 124 mg/dL — AB (ref 65–99)
Glucose-Capillary: 84 mg/dL (ref 65–99)
Glucose-Capillary: 113 mg/dL — ABNORMAL HIGH (ref 65–99)
Glucose-Capillary: 102 mg/dL — ABNORMAL HIGH (ref 65–99)

## 2015-01-12 MED ORDER — MUSCLE RUB 10-15 % EX CREA
TOPICAL_CREAM | CUTANEOUS | Status: DC | PRN
  Administered 2015-01-15 (×2): via TOPICAL
  Administered 2015-01-16: 1 via TOPICAL
  Filled 2015-01-12: qty 85

## 2015-01-12 NOTE — Progress Notes (Signed)
Social Work Patient ID: Kathryn Knapp, female   DOB: April 17, 1941, 74 y.o.   MRN: [id number] Met with pt and spoke with daughter to discuss team conference progression toward goals and discharge still 6/3. Discussed family education and daughter's need to come in next week. She will try to work out a day she can be here but will need to be before kids get home from school.  Pt is trying to learn her wrapping her stump and ilestomy care. Working on medicine coverage  At discharge.

## 2015-01-12 NOTE — Progress Notes (Signed)
Physical Therapy Session Note  Patient Details  Name: Kathryn Knapp MRN: [id number] Date of Birth: 07-28-1941   Today's Date: 01/12/2015 PT Concurrent Time: 0931-1101 PT Concurrent Time Calculation (min): 90 min  Short Term Goals: Week 2:  PT Short Term Goal 1 (Week 2): Patient will perform bed mobility with stand by assistance  PT Short Term Goal 2 (Week 2): Patient will perform slide board transfer with min assist to a level surface PT Short Term Goal 3 (Week 2): Patient will stand with mod assist for 2 minutes with least restrictive assistive device.  PT Short Term Goal 4 (Week 2): Patient will propel wheelchair 150 feet over level surfaces with close supervision  PT Short Term Goal 5 (Week 2): Patient will be able to educate and perform residual limb care with no more than minimal assistance for insepection and residual limb wrapping.      Therapy Documentation Precautions:  Precautions Precautions: Fall Precaution Comments: NWB R LE Restrictions Weight Bearing Restrictions: Yes RLE Weight Bearing: Non weight bearing  Patient propelled wheelchair 150 feetx2 mod I over level tile surface negotiated environmental obstacles and turns without difficulty.  Seated there ex: Right quad sets 50x with a 5 second hold Left hip flexion 50x with a 4 pound cuff weight Left LAQ 50x with a 4 pound cuff weight   Sit to and from stand transfer mod assist  Patient stood for 90 seconds with min assist for balance and upright posture NWB right lower extremity.   Patient attempted to stand with rolling walker and max assist patient reports sharp pain left flank. Patient reports 10/10 pain. Patient unable to come to full stand with max assist to return to seated position in wheelchair. Patient examined and RN Audrea Muscat notified and aware, ice applied. No obvious signs of bruising or swelling. Patient provided ice and a rest break. Patient able to continue with session without difficulty.     Patient performed right residual limb care, inspection and shaping with use of ACE wrap. Patient provided ACE wrap to perform residual limb wrapping and shaping. Patient required min cues and assistance for proper technique. Patient demonstrates significant improvement with residual limb wrapping.  Patient transferred from wheelchair to mat with slide board min assist for set-up and min assist for transfer. Patient continues to demonstrate improved carry-over with technique and sequence.   Short sit to and from supine on mat close supervision  Supine ther ex: B hip abduction 5x10 Right hip flexion 5x10 Left hip flexion AAROM 5x10  Patient transferred from wheelchair to bed with slide board min assist for set-up and min assist for transfer.   Short sit to supine in bed close supervision with use of bedrail.  Vitals monitored and remained stable throughout session and responded appropriately with activity. Patient resting in bed at end of session with call bell and phone in reach and all needs met. Patient denied any pain as session progressed and report she did not have a pain anywhere after session. Patient able to participate in session without difficulty after attempted stand with RW, demonstrating improvement with residual limb care and transferas well as overall strength and endurance.  See FIM for current functional status  Therapy/Group: concurrent   [name] J 01/12/2015, 4:13 PM

## 2015-01-12 NOTE — Progress Notes (Signed)
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  Severe muscle deconditioning  01/11/2015   .  Amputation of right lower extremity below knee  01/01/2015   .  S/P exploratory laparotomy     .  S/P unilateral BKA (below knee amputation)     .  Abdominal pain     .  Pain in joint, lower leg     .  Palliative care encounter     .  Acute respiratory failure with hypoxia     .  Thrombus     .  Acute renal failure syndrome     .  Peritonitis     .  Colon cancer metastasized to multiple sites     .  Acute blood loss anemia     .  Melena     .  Diabetes type 2, controlled     .  Hypokalemia     .  Hypomagnesemia     .  Depression     .  Noncompliance with medication regimen  12/23/2014   .  S/P partial colectomy  12/17/2014   .  Severe sepsis with acute organ dysfunction  12/17/2014   .  Septic shock     .  Bowel obstruction  12/16/2014   .  Colonic mass  12/16/2014     Expected Discharge Date: Expected Discharge Date: 01/19/15  Team Members Present: Physician leading conference: Dr. Alysia Penna Social Worker Present: Ovidio Kin, LCSW Nurse Present: Heather Roberts, RN PT Present: Raylene Everts, PT;Rodney Annie Main, PT OT  Present: Meriel Pica, OT;Sarah Hoxie, OT;Jennifer Tamala Julian, OT SLP Present: Windell Moulding, SLP PPS Coordinator present : Daiva Nakayama, RN, CRRN        Current Status/Progress  Goal  Weekly Team Focus   Medical     Poor endurance, hypoxia resolved, adjustment disorder, not depression after evaluation from neuropsych  Maintain medical stability for home discharge   Increase ability to self manage BKA stump    Bowel/Bladder     Continent urine, Illeostomy patent   Manage illeostomy with min. assist.   Educate re: care.   Swallow/Nutrition/ Hydration       NA         ADL's     mod assist transfers with slide board, max assist toileting, mod assist LB dressing.  Focus on lateral leans for LB self-care tasks at this time  supervision overall, min assist transfers   transfers, residual limb care, dynamic sitting balance, lateral leans   Mobility     min to mod assist with slide board transfers, supervision wheelchair mobility over level  surfaces. mod assist residual llimb care.    supervisiion to min assist  transfers, resdiual limb care, wheelchair management and mobility, standing balance   Communication       NA         Safety/Cognition/ Behavioral Observations    No unsafe behaviors, anticipates needs; mild anxiety at times.   No falls, injury this admission.  Calls, anticipates needs  Monitor   Pain     Left shoulder pain, right BKA incisional pain, phantom pain; Percocet, 2 tabs. 3-4/day.  managed at goal 2/10  Offer prns, continue education r/t alternante methods of pain control.   Skin     Abd. incision with pink tissue, yellow at base. minimal yellowish drainage, no odor. SHearing injuy buttocks healing; BKA incision C.D.I.. with staples.  Healing process at discharge. No s/s infection.   Monitor, continue current plan of care       *See Care Plan and progress notes for long and short-term goals.    Barriers to Discharge:  Multiple medical issues, decreased endurance      Possible  Resolutions to Barriers:   Continue rehabilitation,  continue education      Discharge Planning/Teaching Needs:   Home with daughter and grnad-children have been here to observe in therapies. Pt beginning to learn colostomy care       Team Discussion:    Making progress in therapies-shoulder pain-x-ray ordered. Urgency form lasix using bedpan takes too long to use bedside commode. Learning stump wrapping and ileostomy care.  Decreasing lasix does. Phantom pain being managed.  Need for family education next week   Revisions to Treatment Plan:    None    Continued Need for Acute Rehabilitation Level of Care: The patient requires daily medical management by a physician with specialized training in physical medicine and rehabilitation for the following conditions: Daily direction of a multidisciplinary physical rehabilitation program to ensure safe treatment while eliciting the highest outcome that is of practical value to the patient.: Yes Daily medical management of patient stability for increased activity during participation in an intensive rehabilitation regime.: Yes Daily analysis of laboratory values and/or radiology reports with any subsequent need for medication adjustment of medical intervention for : Other;Pulmonary problems  [name] 01/12/2015, 8:56 AM                 [name], LCSW Social Worker Signed  Patient Care Conference 01/03/2015 12:46 PM    Expand All Collapse All   Inpatient RehabilitationTeam Conference and Plan of Care Update Date: 01/03/2015   Time: 10;50 AM     Patient Name: Kathryn Knapp       Medical Record Number: [id number]  Date of Birth: 1940-12-02 Sex: Female         Room/Bed: 4W12C/4W12C-01 Payor Info: Payor: MEDICARE / Plan: MEDICARE PART A / Product Type: *No Product type* /    Admitting Diagnosis: 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  Amputation of right lower extremity below knee  01/01/2015   .  S/P exploratory laparotomy     .  S/P unilateral BKA (below knee amputation)     .  Abdominal pain     .  Pain in joint, lower leg     .  Palliative care encounter     .  Acute respiratory failure with hypoxia     .  Thrombus     .  Acute renal failure syndrome     .  Peritonitis     .  Colon cancer metastasized to multiple sites     .  Acute blood loss anemia     .  Melena     .  Diabetes type 2, controlled     .  Hypokalemia     .  Hypomagnesemia     .  Depression     .  Noncompliance with medication regimen  12/23/2014   .  S/P partial colectomy  12/17/2014   .  Severe sepsis with acute organ dysfunction  12/17/2014   .  Septic shock     .  Bowel obstruction  12/16/2014   .  Colonic mass  12/16/2014     Expected Discharge Date: Expected Discharge Date: 01/19/15  Team Members Present: Physician leading conference: Dr. Alysia Penna Social Worker Present: Ovidio Kin, LCSW Nurse Present: Heather Roberts, RN PT Present: Jorge Mandril, Renaye Rakers, PT OT Present: Simonne Come, Dorothyann Gibbs, OT SLP Present: Windell Moulding, SLP PPS Coordinator present : Daiva Nakayama, RN, CRRN        Current Status/Progress  Goal  Weekly Team Focus   Medical     poor endurance , hypoxia, depression   maintain medical stability for home d/c   build endurance   Bowel/Bladder     Continent urine; Illeostomy patent   Manage illeostomy min assist  Educate, moniotr   Swallow/Nutrition/ Hydration               ADL's     +2 for toileting and LB dressing at sit > stand level, max assist slide board transfer, min assist dynamic sitting balance   supervision overall, min assist transfers   transfers, residual limb care, dynamic sitting balance     Mobility     Max asssit for slide board transfer, total assist for residual limb care and standing, min assist wheelchair mobility.   SUpervision to min assist overall    Transfers, wheelchair management/mobility, residual limb care     Communication       na         Safety/Cognition/ Behavioral Observations      no unsafe behaviors         Pain     Phantom pain rt. BKA;  Abd. pain, 2 percocet 2-3 times day, effective.  Managed at goal  Offer PRNs, monitor.  Educatet on phantom pain, methods for relief.   Skin     Abd. incision wet to dry dressing; yellow tissue at base, moderate amount yellowish drainage, no odor. Shearing injury lt buttocks, resolving MASD; Rt. BKA with stables CDI, mild redness at line.   Healng process at D/C.  Monitor, reposition      *See Care Plan and progress notes for long and short-term goals.    Barriers to Discharge:  se above     Possible Resolutions to Barriers:   cont rehab     Discharge Planning/Teaching Needs:   Home with daughter and grand children-family very committed to pt and will do whatever is needed for her       Team Discussion:    Goals-supervision/min wheelchair level-building up endurance.  Learning ostomy care.  Off O2 today. Chest x-ray-volume overload-staring lasix today,  but will watch potassium level. Neuro-psych to see for coping.  Doing better today than yesterday.   Revisions to Treatment Plan:    None    Continued Need for Acute Rehabilitation Level of Care: The patient requires daily medical management by a physician with specialized training in physical medicine and rehabilitation for the following conditions: Daily medical management of patient stability for increased activity during participation in an intensive rehabilitation regime.: Yes Daily analysis of laboratory values and/or radiology reports with any subsequent need for medication adjustment of medical intervention for : Post surgical problems;Other  [name] 01/04/2015, 8:34 AM                  Patient ID: Nonah Mattes, female   DOB: 15-Sep-1940, 75 y.o.   MRN: [id number]

## 2015-01-12 NOTE — Progress Notes (Signed)
Occupational Therapy Session Note  Patient Details  Name: Kathryn Knapp MRN: [id number] Date of Birth: May 09, 1941  Today's Date: 01/12/2015 OT Individual Time: 3154-0086 and 1302-1402 OT Individual Time Calculation (min): 60 min and 60 min   Short Term Goals: Week 2:  OT Short Term Goal 1 (Week 2): Pt will complete toilet transfer to drop arm BSC with slide board with min assist OT Short Term Goal 2 (Week 2): Pt will complete toileting tasks with lateral leans with min assist  OT Short Term Goal 3 (Week 2): Pt will complete bathing with lateral leans with supervision OT Short Term Goal 4 (Week 2): Pt will complete LB dressing with lateral leans with min assist  Skilled Therapeutic Interventions/Progress Updates:    1) Engaged in ADL retraining with focus on lateral leans with self-care tasks, transfers, and problem solving setup for bathing at home.  Pt completed bathing and dressing at EOB with lateral leans for LB bathing and dressing requiring steady assist when donning shorts.  Discussed pt preference to engage in bathing in bathroom upon d/c home, therefore needing to further address lateral leans in smaller spaces, ie w/c or BSC, as these pose much more difficulty.  Educated on wrapping of residual limb with pt performing x2 with decreased ability to wrap fully, requiring mod cues and assist for proper technique with pt checking behind therapist to ensure proper fit.  Transferred bed > w/c with slide board with initial mod assist progressing to min assist during transfer with continued tactile cues for forward weight shift.  Pt left up in w/c with all needs in reach.  2) Engaged in functional transfer training and lateral leans with clothing management.  Bed <> drop arm BSC transfers with slide board with mod assist when transferring to Rt due to decreased RUE strength and ability to maintain pressure on slide board.  On BSC completed toileting with pt completing lateral leans to adjust skirt  to complete toileting tasks.  Additional lateral leans to pull skirt down over hips to simulate doffing shorts/pants and pulling back up with pt requiring min/steady assist when donning shorts over hips due to instability and fearfulness in sitting.  Transferred bed <> w/c with slide board with mod assist to Rt and min to Lt.  In w/c again completed lateral leans for clothing management as pt reports desire to bathe at sink from w/c level.  Pt with increased difficulty with getting clothes up over hips in w/c due to narrow width.  Discussed possibility of washing at sink level from [facility] or bathing at sink level and then getting dressed at EOB.  Plan to further address lateral leans and LB dressing in various scenarios.  Pt left seated in w/c with all needs in reach.  Therapy Documentation Precautions:  Precautions Precautions: Fall Precaution Comments: NWB R LE Restrictions Weight Bearing Restrictions: Yes RLE Weight Bearing: Non weight bearing General:   Vital Signs: Therapy Vitals Temp: 97.9 F (36.6 C) Temp Source: Oral Pulse Rate: 78 Resp: 16 BP: 133/60 mmHg Patient Position (if appropriate): Lying Oxygen Therapy SpO2: 96 % O2 Device: Not Delivered Pain:  Pt with c/o pain in stomach, RN aware.  See FIM for current functional status  Therapy/Group: Individual Therapy  Simonne Come 01/12/2015, 8:37 AM

## 2015-01-12 NOTE — Plan of Care (Signed)
Problem: RH Balance Goal: LTG Patient will maintain dynamic standing with ADLs (OT) LTG: Patient will maintain dynamic standing balance with assist during activities of daily living (OT)  Outcome: Not Applicable Date Met:  93/40/68 D/C due to recommendation of lateral leans with all self-care tasks at this time

## 2015-01-13 ENCOUNTER — Inpatient Hospital Stay (HOSPITAL_COMMUNITY): Payer: Medicare Other | Admitting: Physical Therapy

## 2015-01-13 LAB — CBC
HCT: 27.4 % — ABNORMAL LOW (ref 36.0–46.0)
Hemoglobin: 8.4 g/dL — ABNORMAL LOW (ref 12.0–15.0)
MCH: 26.2 pg (ref 26.0–34.0)
MCHC: 30.7 g/dL (ref 30.0–36.0)
MCV: 85.4 fL (ref 78.0–100.0)
PLATELETS: 747 10*3/uL — AB (ref 150–400)
RBC: 3.21 MIL/uL — ABNORMAL LOW (ref 3.87–5.11)
RDW: 17.9 % — ABNORMAL HIGH (ref 11.5–15.5)
WBC: 8 10*3/uL (ref 4.0–10.5)

## 2015-01-13 LAB — GLUCOSE, CAPILLARY
GLUCOSE-CAPILLARY: 121 mg/dL — AB (ref 65–99)
Glucose-Capillary: 97 mg/dL (ref 65–99)
Glucose-Capillary: 123 mg/dL — ABNORMAL HIGH (ref 65–99)
Glucose-Capillary: 124 mg/dL — ABNORMAL HIGH (ref 65–99)

## 2015-01-13 NOTE — Progress Notes (Signed)
Physical Therapy Session Note  Patient Details  Name: Kathryn Knapp MRN: [id number] Date of Birth: 02-May-1941  Today's Date: 01/13/2015 PT Concurrent Time: 3557-3220 PT Concurrent Time Calculation (min): 80 min  Short Term Goals: Week 2:  PT Short Term Goal 1 (Week 2): Patient will perform bed mobility with stand by assistance  PT Short Term Goal 2 (Week 2): Patient will perform slide board transfer with min assist to a level surface PT Short Term Goal 3 (Week 2): Patient will stand with mod assist for 2 minutes with least restrictive assistive device.  PT Short Term Goal 4 (Week 2): Patient will propel wheelchair 150 feet over level surfaces with close supervision  PT Short Term Goal 5 (Week 2): Patient will be able to educate and perform residual limb care with no more than minimal assistance for insepection and residual limb wrapping.   Therapy Documentation Precautions:  Precautions Precautions: Fall Precaution Comments: NWB R LE Restrictions Weight Bearing Restrictions: Yes RLE Weight Bearing: Non weight bearing   Pain: Pain Assessment Pain Assessment: 0-10 Pain Score: 8  Pain Type: Acute pain Pain Location: Abdomen Pain Descriptors / Indicators: Sore Pain Onset: On-going Pain Intervention(s): Rest;Relaxation  Supine to sort sit in bed close supervision with use of bed rail Stand pivot transfer max assist from bed to wheelchair   Patient propelled wheelchair 150 feetx2 mod I over level tile surface negotiated environmental obstacles and turns without difficulty.  UBE 10 minutes  Rest after 5 minutes; 5  Minutes forward, 5 minutes backwards  Seated there ex: Right quad sets 50x with a 5 second hold  Sit to and from stand transfer mod assist  Patient stood for 90 seconds with min assist for balance and upright posture NWB right lower extremity.   Patient performed right residual limb care, inspection and shaping with use of ACE wrap. Patient provided ACE wrap to  perform residual limb wrapping and shaping. Patient required min cues and assistance for proper technique. Patient benefits from mirror in front when performing residual limb wrapping.   Patient transferred from wheelchair to mat with slide board x4 trials min assist for set-up and min assist for transfer. Patient continues to demonstrate improved carry-over with technique and sequence.   Vitals monitored and remained stable throughout session and responded appropriately with activity. Patient resting in wheelchair at end of session with call bell and phone in reach and all needs met.  Therapy/Group: concurrent  [name] 01/13/2015, 12:52 PM

## 2015-01-13 NOTE — Progress Notes (Signed)
2 (+) pitting edema to left foot and ankle. Assisting with emptying ostomy. Kathryn Knapp A

## 2015-01-13 NOTE — Progress Notes (Signed)
ANTICOAGULATION CONSULT NOTE - Follow Up Consult  Pharmacy Consult for lovenox Indication: atrial fibrillation  No Known Allergies  Patient Measurements: Height: 5\' 11"  (180.3 cm) Weight: 178 lb 9.2 oz (81 kg) IBW/kg (Calculated) : 70.8 Heparin Dosing Weight:   Vital Signs: Temp: 98 F (36.7 C) (05/28 0646) Temp Source: Oral (05/28 0646) BP: 136/61 mmHg (05/28 0646) Pulse Rate: 85 (05/28 0646)  Labs:  Recent Labs  01/13/15 0515  HGB 8.4*  HCT 27.4*  PLT 747*    Estimated Creatinine Clearance: 70 mL/min (by C-G formula based on Cr of 0.78).   Medications:  Scheduled:  . amLODipine  5 mg Oral Daily  . docusate sodium  100 mg Oral Daily  . enoxaparin (LOVENOX) injection  120 mg Subcutaneous Q24H  . feeding supplement (ENSURE ENLIVE)  237 mL Oral BID BM  . FLUoxetine  20 mg Oral Daily  . gabapentin  100 mg Oral TID  . insulin aspart  0-9 Units Subcutaneous TID WC & HS  . pantoprazole  40 mg Oral Daily   Infusions:    Assessment: 74 yo female with hx of afib and malignancy is currently on treatment dose of lovenox.  Hgb 8.4 and Plt 747 K stable.  Goal of Therapy:  Anti-Xa level 0.6-1 units/ml 4hrs after LMWH dose given Monitor platelets by anticoagulation protocol: Yes   Plan:  - continue lovenox 120 mg sq q24h - CBC every 72 hours  Candie Mile 01/13/2015,10:49 AM

## 2015-01-13 NOTE — Progress Notes (Signed)
Patient ID: Kathryn Knapp, female   DOB: 05-13-1941, 74 y.o.   MRN: [id number]   Patient ID: Kathryn Knapp, female   DOB: 1941-01-21, 74 y.o.   MRN: [id number]   Patient ID: Kathryn Knapp, female   DOB: [birth year], 74 y.o.   MRN: [id number]    01/13/15.  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 Hospital course complicated by sepsis syndrome with acute respiratory failure and acute renal failure syndrome and acute blood loss anemia.  Hemoglobin  8.4 grams percent today  Past Medical History  Diagnosis Date  . Diabetes mellitus without complication   . Hypertension      Subjective/Complaints: No SOB, no leg pain or abd pain this am.   ROS  Negative except for midline abdominal pain, no phantom pain    Objective: Vital Signs: Blood pressure 136/61, pulse 85, temperature 98 F (36.7 C), temperature source Oral, resp. rate 18, height 5\' 11"  (1.803 m), weight 178 lb 9.2 oz (81 kg), SpO2 100 %. No results found. Results for orders placed or performed during the hospital encounter of 01/01/15 (from the past 72 hour(s))  Glucose, capillary     Status: Abnormal   Collection Time: 01/10/15 11:54 AM  Result Value Ref Range   Glucose-Capillary 115 (H) 65 - 99 mg/dL  Glucose, capillary     Status: Abnormal   Collection Time: 01/10/15  4:28 PM  Result Value Ref Range   Glucose-Capillary 146 (H) 65 - 99 mg/dL   Comment 1 Notify RN   Glucose, capillary     Status: None   Collection Time: 01/10/15  9:18 PM  Result Value Ref Range   Glucose-Capillary 91 65 - 99 mg/dL  Glucose, capillary     Status: None   Collection Time: 01/11/15  6:48 AM  Result Value Ref Range   Glucose-Capillary 82 65 - 99 mg/dL  Glucose, capillary     Status: Abnormal   Collection Time: 01/11/15  12:13 PM  Result Value Ref Range   Glucose-Capillary 112 (H) 65 - 99 mg/dL   Comment 1 Notify RN   Glucose, capillary     Status: None   Collection Time: 01/11/15  4:59 PM  Result Value Ref Range   Glucose-Capillary 93 65 - 99 mg/dL   Comment 1 Notify RN   Glucose, capillary     Status: Abnormal   Collection Time: 01/11/15  9:26 PM  Result Value Ref Range   Glucose-Capillary 106 (H) 65 - 99 mg/dL  Glucose, capillary     Status: None   Collection Time: 01/12/15  6:50 AM  Result Value Ref Range   Glucose-Capillary 84 65 - 99 mg/dL  Glucose, capillary     Status: Abnormal   Collection Time: 01/12/15 11:31 AM  Result Value Ref Range   Glucose-Capillary 113 (H) 65 - 99 mg/dL   Comment 1 Notify RN   Glucose, capillary     Status: Abnormal   Collection Time: 01/12/15  4:39 PM  Result Value Ref Range   Glucose-Capillary 102 (H) 65 - 99 mg/dL   Comment 1 Notify RN   Glucose, capillary     Status: Abnormal   Collection Time: 01/12/15  9:17 PM  Result Value Ref Range   Glucose-Capillary 124 (H) 65 - 99 mg/dL  CBC     Status:  Abnormal   Collection Time: 01/13/15  5:15 AM  Result Value Ref Range   WBC 8.0 4.0 - 10.5 K/uL   RBC 3.21 (L) 3.87 - 5.11 MIL/uL   Hemoglobin 8.4 (L) 12.0 - 15.0 g/dL   HCT 27.4 (L) 36.0 - 46.0 %   MCV 85.4 78.0 - 100.0 fL   MCH 26.2 26.0 - 34.0 pg   MCHC 30.7 30.0 - 36.0 g/dL   RDW 17.9 (H) 11.5 - 15.5 %   Platelets 747 (H) 150 - 400 K/uL  Glucose, capillary     Status: None   Collection Time: 01/13/15  6:35 AM  Result Value Ref Range   Glucose-Capillary 97 65 - 99 mg/dL     Patient Vitals for the past 24 hrs:  BP Temp Temp src Pulse Resp SpO2  01/13/15 0646 136/61 mmHg 98 F (36.7 C) Oral 85 18 100 %  01/12/15 1607 127/60 mmHg 97.7 F (36.5 C) Oral 92 18 98 %   Lab Results  Component Value Date   HGBA1C 6.3* 12/24/2014    Intake/Output Summary (Last 24 hours) at 01/13/15 0932 Last data filed at 01/13/15 0700  Gross per 24 hour  Intake     600 ml  Output    900 ml  Net   -300 ml     HEENT: poor dentition-dentures in place Cardio: RRR  , grade 2/6 systolic murmur Resp: CTA B/L and unlabored GI: BS positive and midline incision, good granulation tissue,, wet to dry dressings RLQ colostomy Extremity:   Right stump bandaged  +2 edema L leg Skin:   Wound C/D/I; left-sided SCD Neuro: Alert/Oriented, Musc/Skel:  Other right BKA, healing well Gen. no acute distress   Assessment/Plan: 1. Functional deficits secondary to right BKA in a patient deconditioned with metastatic adenocarcinoma of the colon 2.  DVT Prophylaxis/Anticoagulation: Subcutaneous Lovenox. Monitor platelet counts and any signs of bleeding; discussed with hematology Dr Darnell Level who recommends full dose anticoag with lovenox without conversion to warfarin given active metastatic CA, for at least 6 mo, given wt loss (?BKA) will reduce dose to 120mg  q24h                 3. Pain Management: Neurontin 100 mg 3 times a day, Oxycodone. Monitor with increased mobility 4. Mood/depression: Prozac 20 mg daily.  5.  Skin/Wound Care: Local care to right BKA site as well as abdominal wound/ostomy and deep tissue injury to buttocks and sacrum.  WOC RN following for wound care. Has had some issues with seal of ostomy  6. Acute blood loss anemia. Follow-up CBCs 7.  Diabetes mellitus with peripheral neuropathy. Hemoglobin A1c 6.3. Sliding scale insulin. Check blood sugars before meals and at bedtime 8. Hypertension. Norvasc 5 mg daily.   LOS (Days) 12 A FACE TO FACE EVALUATION WAS PERFORMED  [name] 01/13/2015, 9:32 AM

## 2015-01-14 ENCOUNTER — Inpatient Hospital Stay (HOSPITAL_COMMUNITY): Payer: Medicare Other | Admitting: Occupational Therapy

## 2015-01-14 LAB — GLUCOSE, CAPILLARY
GLUCOSE-CAPILLARY: 170 mg/dL — AB (ref 65–99)
Glucose-Capillary: 95 mg/dL (ref 65–99)
Glucose-Capillary: 141 mg/dL — ABNORMAL HIGH (ref 65–99)
Glucose-Capillary: 109 mg/dL — ABNORMAL HIGH (ref 65–99)

## 2015-01-14 NOTE — Progress Notes (Signed)
Patient ID: Kathryn Knapp, female   DOB: 16-Jul-1941, 74 y.o.   MRN: [id number]    01/13/15.  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 Hospital course complicated by sepsis syndrome with acute respiratory failure and acute renal failure syndrome and acute blood loss anemia.  Hemoglobin  8.4 grams percent 01/13/15.  Past Medical History  Diagnosis Date  . Diabetes mellitus without complication   . Hypertension      Subjective/Complaints: Developed L sided chest wall pain during PT yesterday and still having some discomfort.   Improved with heating pad ROS  Negative except for midline abdominal pain, no phantom pain    Objective: Vital Signs: Blood pressure 136/55, pulse 73, temperature 97.5 F (36.4 C), temperature source Oral, resp. rate 20, height 5\' 11"  (1.803 m), weight 178 lb 9.2 oz (81 kg), SpO2 97 %. No results found. Results for orders placed or performed during the hospital encounter of 01/01/15 (from the past 72 hour(s))  Glucose, capillary     Status: Abnormal   Collection Time: 01/11/15 12:13 PM  Result Value Ref Range   Glucose-Capillary 112 (H) 65 - 99 mg/dL   Comment 1 Notify RN   Glucose, capillary     Status: None   Collection Time: 01/11/15  4:59 PM  Result Value Ref Range   Glucose-Capillary 93 65 - 99 mg/dL   Comment 1 Notify RN   Glucose, capillary     Status: Abnormal   Collection Time: 01/11/15  9:26 PM  Result Value Ref Range   Glucose-Capillary 106 (H) 65 - 99 mg/dL  Glucose, capillary     Status: None   Collection Time: 01/12/15  6:50 AM  Result Value Ref Range   Glucose-Capillary 84 65 - 99 mg/dL  Glucose, capillary     Status: Abnormal   Collection Time: 01/12/15 11:31 AM  Result Value Ref Range   Glucose-Capillary 113  (H) 65 - 99 mg/dL   Comment 1 Notify RN   Glucose, capillary     Status: Abnormal   Collection Time: 01/12/15  4:39 PM  Result Value Ref Range   Glucose-Capillary 102 (H) 65 - 99 mg/dL   Comment 1 Notify RN   Glucose, capillary     Status: Abnormal   Collection Time: 01/12/15  9:17 PM  Result Value Ref Range   Glucose-Capillary 124 (H) 65 - 99 mg/dL  CBC     Status: Abnormal   Collection Time: 01/13/15  5:15 AM  Result Value Ref Range   WBC 8.0 4.0 - 10.5 K/uL   RBC 3.21 (L) 3.87 - 5.11 MIL/uL   Hemoglobin 8.4 (L) 12.0 - 15.0 g/dL   HCT 27.4 (L) 36.0 - 46.0 %   MCV 85.4 78.0 - 100.0 fL   MCH 26.2 26.0 - 34.0 pg   MCHC 30.7 30.0 - 36.0 g/dL   RDW 17.9 (H) 11.5 - 15.5 %   Platelets 747 (H) 150 - 400 K/uL  Glucose, capillary     Status: None   Collection Time: 01/13/15  6:35 AM  Result Value Ref Range   Glucose-Capillary 97 65 - 99 mg/dL  Glucose, capillary     Status: Abnormal   Collection Time: 01/13/15 12:00 PM  Result Value Ref Range   Glucose-Capillary 123 (H) 65 - 99 mg/dL   Comment 1  Notify RN   Glucose, capillary     Status: Abnormal   Collection Time: 01/13/15  4:51 PM  Result Value Ref Range   Glucose-Capillary 121 (H) 65 - 99 mg/dL   Comment 1 Notify RN   Glucose, capillary     Status: Abnormal   Collection Time: 01/13/15  9:05 PM  Result Value Ref Range   Glucose-Capillary 124 (H) 65 - 99 mg/dL  Glucose, capillary     Status: None   Collection Time: 01/14/15  6:55 AM  Result Value Ref Range   Glucose-Capillary 95 65 - 99 mg/dL     Patient Vitals for the past 24 hrs:  BP Temp Temp src Pulse Resp SpO2  01/14/15 0534 (!) 136/55 mmHg 97.5 F (36.4 C) Oral 73 20 97 %  01/13/15 1500 (!) 130/53 mmHg 98.1 F (36.7 C) Oral 77 (!) 26 98 %   Lab Results  Component Value Date   HGBA1C 6.3* 12/24/2014    Intake/Output Summary (Last 24 hours) at 01/14/15 0816 Last data filed at 01/13/15 2354  Gross per 24 hour  Intake    600 ml  Output    425 ml  Net    175  ml     HEENT: poor dentition-dentures in place Cardio: RRR  , grade 2/6 systolic murmur Resp: CTA B/L and unlabored GI: BS positive and midline incision, good granulation tissue,, wet to dry dressings RLQ colostomy Extremity:   Right stump bandaged  +2 edema L leg Skin:   Wound C/D/I; left-sided SCD Neuro: Alert/Oriented, Musc/Skel:  Other right BKA, healing well;  Tenderness over L anterolateral chest wall; heating pad in place Gen. no acute distress   Assessment/Plan: 1. Functional deficits secondary to right BKA in a patient deconditioned with metastatic adenocarcinoma of the colon 2.  DVT Prophylaxis/Anticoagulation: Subcutaneous Lovenox. Monitor platelet counts and any signs of bleeding; discussed with hematology Dr Darnell Level who recommends full dose anticoag with lovenox without conversion to warfarin given active metastatic CA, for at least 6 mo, given wt loss (?BKA) will reduce dose to 120mg  q24h                 3. Pain Management: Neurontin 100 mg 3 times a day, Oxycodone. Monitor with increased mobility 4. Mood/depression: Prozac 20 mg daily.  5.  Skin/Wound Care: Local care to right BKA site as well as abdominal wound/ostomy and deep tissue injury to buttocks and sacrum.  WOC RN following for wound care. Has had some issues with seal of ostomy  6. Acute blood loss anemia. Follow-up CBCs 7.  Diabetes mellitus with peripheral neuropathy. Hemoglobin A1c 6.3. Sliding scale insulin. Check blood sugars before meals and at bedtime 8. Hypertension. Norvasc 5 mg daily. 9.  L chest wall pain;  Will review Xray- r/o pathologic fracture   LOS (Days) 13 A FACE TO FACE EVALUATION WAS PERFORMED  Kathryn Knapp 01/14/2015, 8:16 AM

## 2015-01-14 NOTE — Progress Notes (Signed)
Occupational Therapy Session Note  Patient Details  Name: Kathryn Knapp MRN: [id number] Date of Birth: [birth year]  Today's Date: 01/14/2015 OT Individual Time:  -   1630-1700  (45 min)      Short Term Goals: Week 1:  OT Short Term Goal 1 (Week 1): Pt will complete toilet transfer to drop arm BSC with max assist of one caregiver OT Short Term Goal 1 - Progress (Week 1): Met OT Short Term Goal 2 (Week 1): Pt will complete LB dressing with max assist of one caregiver OT Short Term Goal 2 - Progress (Week 1): Met OT Short Term Goal 3 (Week 1): Pt will complete bathing with min assist OT Short Term Goal 3 - Progress (Week 1): Met OT Short Term Goal 4 (Week 1): Pt will complete toileting with max assist of one caregiver OT Short Term Goal 4 - Progress (Week 1): Met  Skilled Therapeutic Interventions/Progress Updates:    Pt. Stated her pain was 0/10 but 2 hours earlier it was 10/10 in her ribs.  Pt. Lying in bed.  Was SBA with supine to EOB.  Transferred from bed to wc with min assist using sliding board.    Propelled wc to day room.  Performed wc pushups with RUE and LLE and LE AROM in all planes.  Pt. Returned to room and left with family present and all needs in reach.    Therapy Documentation Precautions:  Precautions Precautions: Fall Precaution Comments: NWB R LE Restrictions Weight Bearing Restrictions: Yes RLE Weight Bearing: Non weight bearing      Pain: Pain Assessment Pain Assessment: 0-10 Pain Score: 0-No pain Pain Type: Acute pain Pain Location: Abdomen Pain Orientation: Left Pain Descriptors / Indicators: Cramping;Sharp Pain Frequency: Intermittent Pain Intervention(s): Medication (See eMAR)           See FIM for current functional status  Therapy/Group: Individual Therapy  Kathryn Knapp 01/14/2015, 6:17 PM

## 2015-01-15 ENCOUNTER — Encounter (HOSPITAL_COMMUNITY): Payer: Medicare Other

## 2015-01-15 ENCOUNTER — Inpatient Hospital Stay (HOSPITAL_COMMUNITY): Payer: Medicare Other | Admitting: Physical Therapy

## 2015-01-15 ENCOUNTER — Inpatient Hospital Stay (HOSPITAL_COMMUNITY): Payer: Medicare Other | Admitting: Occupational Therapy

## 2015-01-15 LAB — CREATININE, SERUM
Creatinine, Ser: 0.67 mg/dL (ref 0.44–1.00)
GFR calc Af Amer: >60 mL/min (ref >60)

## 2015-01-15 LAB — GLUCOSE, CAPILLARY
GLUCOSE-CAPILLARY: 115 mg/dL — AB (ref 65–99)
GLUCOSE-CAPILLARY: 106 mg/dL — AB (ref 65–99)
GLUCOSE-CAPILLARY: 121 mg/dL — AB (ref 65–99)
Glucose-Capillary: 124 mg/dL — ABNORMAL HIGH (ref 65–99)

## 2015-01-15 MED ORDER — METHOCARBAMOL 500 MG PO TABS
500.0000 mg | ORAL_TABLET | Freq: Four times a day (QID) | ORAL | Status: DC | PRN
  Administered 2015-01-15 – 2015-01-18 (×4): 500 mg via ORAL
  Filled 2015-01-15 (×4): qty 1

## 2015-01-15 NOTE — Progress Notes (Signed)
notified Marlowe Sh[place], PA with exceeding acetaminophen dose limit with PRN pain medications. No new orders at this time. Continue plan of care with MD to reassess.

## 2015-01-15 NOTE — Progress Notes (Signed)
Occupational Therapy Session Note  Patient Details  Name: Kathryn Knapp MRN: [id number] Date of Birth: 08-Mar-1941  Today's Date: 01/15/2015 OT Individual Time: 0730-0900 OT Individual Time Calculation (min): 90 min   Short Term Goals: Week 1:  OT Short Term Goal 1 (Week 1): Pt will complete toilet transfer to drop arm BSC with max assist of one caregiver OT Short Term Goal 1 - Progress (Week 1): Met OT Short Term Goal 2 (Week 1): Pt will complete LB dressing with max assist of one caregiver OT Short Term Goal 2 - Progress (Week 1): Met OT Short Term Goal 3 (Week 1): Pt will complete bathing with min assist OT Short Term Goal 3 - Progress (Week 1): Met OT Short Term Goal 4 (Week 1): Pt will complete toileting with max assist of one caregiver OT Short Term Goal 4 - Progress (Week 1): Met   Week 2:  OT Short Term Goal 1 (Week 2): Pt will complete toilet transfer to drop arm BSC with slide board with min assist OT Short Term Goal 2 (Week 2): Pt will complete toileting tasks with lateral leans with min assist  OT Short Term Goal 3 (Week 2): Pt will complete bathing with lateral leans with supervision OT Short Term Goal 4 (Week 2): Pt will complete LB dressing with lateral leans with min assist  Skilled Therapeutic Interventions/Progress Updates:  Patient received supine in bed with no complaints of pain except during transitional movements (pt with 7/10 pain in left side/rib area that would go away statically). Pt engaged in bed mobility and sat EOB to eat breakfast. During breakfast, discussed d/c planning with pt and her daughter (main caregiver post discharge). After breakfast, pt performed compression wrapping > right residual limb with mod assist provided from therapist; pt with good carryover from previous education sessions on compression wrapping. Education provided to pt's daughter regarding slide board transfers into w/c, steady assist needed for transfer and min cues needed for  education > daughter. Pt then propelled self > sink for ADL and grooming tasks. UB B/D only secondary to pt stated she had an accident earlier this am and was cleaned up in bed. Pt then engaged in [facility] transfer using slide board from w/c level; min verbal cues for education and supervision for actual transfer. Pt then propelled self from room > therapy gym. Pt transferred w/c>therapy mat (scoot transfer without using slide board). Seated edge of mat, pt engaged in therapeutic exercise using pushup blocks (2 sets of 5). Then pt used 4lb weighted bar for 3 BUE strengthening exercises (2 sets of 10). Pt transferred back to w/c and propelled self back to room. At end of session, left patient seated in w/c with family present and all needs within reach.   Encouraged pt to continue to use slide board, even though we practiced without, for overall safety and from a pain management standpoint; pt with increased pain during scoot transfer > left side/rib cage area.   Precautions:  Precautions Precautions: Fall Precaution Comments: NWB R LE Restrictions Weight Bearing Restrictions: Yes RLE Weight Bearing: Non weight bearing  See FIM for current functional status  Therapy/Group: Individual Therapy  Madysin Crisp , MS, OTR/L, CLT Pager: [phone]  01/15/2015, 9:01 AM

## 2015-01-15 NOTE — Progress Notes (Addendum)
Physical Therapy Session Note  Patient Details  Name: Kathryn Knapp MRN: [id number] Date of Birth: 1940/10/19  Today's Date: 01/15/2015 PT Individual Time: 1100-1200 PT Individual Time Calculation (min): 60 min   Short Term Goals: Week 2:  PT Short Term Goal 1 (Week 2): Patient will perform bed mobility with stand by assistance  PT Short Term Goal 2 (Week 2): Patient will perform slide board transfer with min assist to a level surface PT Short Term Goal 3 (Week 2): Patient will stand with mod assist for 2 minutes with least restrictive assistive device.  PT Short Term Goal 4 (Week 2): Patient will propel wheelchair 150 feet over level surfaces with close supervision  PT Short Term Goal 5 (Week 2): Patient will be able to educate and perform residual limb care with no more than minimal assistance for insepection and residual limb wrapping.   Therapy Documentation Precautions:  Precautions Precautions: Fall Precaution Comments: NWB R LE Restrictions Weight Bearing Restrictions: Yes RLE Weight Bearing: Non weight bearing  Patient received supine in bed at beginning of session. Daughter Faythe Dingwall present for family training and observation. Daughter educated on current functional status, precautions, goals and progress in therapy and need for 24/7 supervision and assistance. Daughter verbalized understanding and in agreement with recommendations.   Short sit to supine in bed with use of bed rail close supervision.   Daughter educated and provided demonstration for slide board transfer from bed to wheelchair with min  assist being provided. Daughter educated on set -up, proper body mechanics, positioning, hand placement, and safety in order to successfully complete transfer with patient. Daughter and patient educated patient is not to slide on board and is push up to clear her bottom as to not create any sheering on her bottom. Patient and daughter verbalized understanding.  Patient propelled  wheelchair with bilateral upper extremities 250 feet mod I.  Daughter returned demonstration of slide board transfer technique from wheelchair to bed in ADL apartment. Daughter and patient did not need any hands on assistance however required mod verbal cues for technique and sequencing from wheelchair to the bed and then only min verbal cues from bed to wheelchair. Daughter demonstrated fair carry-over of technique.   Daughter provided education on wheelchair parts management and was able to successfully return demonstration.   Patient propelled outdoor over uneven and unlevel surfaces 200 feetx2 with close supervision. Verbal cues need for obstacle negotiation and environmental awareness. Recommendation made for daughter to not allow patient to propel wheelchair outside without assistance and some present with patient at all times. Patient and daughter verbalized understanding and in agreement with recommendation.  Daughter educated on how to mange wheelchair on curb cut outs and up and down inclines and declines.    Sit to and from stand transfer min assist   Patient stood two minutes min assist  Patient remained seated in wheelchair at end of session with all needs met. Reinforced education to daughter and patient. Recommendation made for an additional family training daughter and patient aware and in agreement with recommendation. All needs met prior to exit and patient resting comfortably without pain.  Patient did experience one episode of 8/10 pain during transfer to and from bed with daughter that subsided after approximately 5 minutes.   See FIM for current functional status  Therapy/Group: Individual Therapy  [name] 01/15/2015, 2:31 PM

## 2015-01-15 NOTE — Progress Notes (Signed)
Subjective/Complaints: Still with left subcostal pain anterior, no fall, twisted in therapy, pain comes and goes mainly with activity, no pain with breathingROS  Negative except for midline abdominal pain,which is improving   Objective: Vital Signs: Blood pressure 128/55, pulse 75, temperature 98 F (36.7 C), temperature source Oral, resp. rate 18, height 5\' 11"  (1.803 m), weight 81 kg (178 lb 9.2 oz), SpO2 99 %. No results found. Results for orders placed or performed during the hospital encounter of 01/01/15 (from the past 72 hour(s))  Glucose, capillary     Status: Abnormal   Collection Time: 01/12/15 11:31 AM  Result Value Ref Range   Glucose-Capillary 113 (H) 65 - 99 mg/dL   Comment 1 Notify RN   Glucose, capillary     Status: Abnormal   Collection Time: 01/12/15  4:39 PM  Result Value Ref Range   Glucose-Capillary 102 (H) 65 - 99 mg/dL   Comment 1 Notify RN   Glucose, capillary     Status: Abnormal   Collection Time: 01/12/15  9:17 PM  Result Value Ref Range   Glucose-Capillary 124 (H) 65 - 99 mg/dL  CBC     Status: Abnormal   Collection Time: 01/13/15  5:15 AM  Result Value Ref Range   WBC 8.0 4.0 - 10.5 K/uL   RBC 3.21 (L) 3.87 - 5.11 MIL/uL   Hemoglobin 8.4 (L) 12.0 - 15.0 g/dL   HCT 01/15/15 (L) 61.8 - 05.4 %   MCV 85.4 78.0 - 100.0 fL   MCH 26.2 26.0 - 34.0 pg   MCHC 30.7 30.0 - 36.0 g/dL   RDW 57.0 (H) 85.5 - 11.0 %   Platelets 747 (H) 150 - 400 K/uL  Glucose, capillary     Status: None   Collection Time: 01/13/15  6:35 AM  Result Value Ref Range   Glucose-Capillary 97 65 - 99 mg/dL  Glucose, capillary     Status: Abnormal   Collection Time: 01/13/15 12:00 PM  Result Value Ref Range   Glucose-Capillary 123 (H) 65 - 99 mg/dL   Comment 1 Notify RN   Glucose, capillary     Status: Abnormal   Collection Time: 01/13/15  4:51 PM  Result Value Ref Range   Glucose-Capillary 121 (H) 65 - 99 mg/dL   Comment 1 Notify RN   Glucose, capillary     Status: Abnormal    Collection Time: 01/13/15  9:05 PM  Result Value Ref Range   Glucose-Capillary 124 (H) 65 - 99 mg/dL  Glucose, capillary     Status: None   Collection Time: 01/14/15  6:55 AM  Result Value Ref Range   Glucose-Capillary 95 65 - 99 mg/dL  Glucose, capillary     Status: Abnormal   Collection Time: 01/14/15 11:49 AM  Result Value Ref Range   Glucose-Capillary 170 (H) 65 - 99 mg/dL  Glucose, capillary     Status: Abnormal   Collection Time: 01/14/15  4:29 PM  Result Value Ref Range   Glucose-Capillary 141 (H) 65 - 99 mg/dL  Glucose, capillary     Status: Abnormal   Collection Time: 01/14/15  9:03 PM  Result Value Ref Range   Glucose-Capillary 109 (H) 65 - 99 mg/dL   Comment 1 Notify RN   Creatinine, serum     Status: None   Collection Time: 01/15/15  6:21 AM  Result Value Ref Range   Creatinine, Ser 0.67 0.44 - 1.00 mg/dL   GFR calc non Af Amer >60 >60  mL/min   GFR calc Af Amer >60 >60 mL/min    Comment: (NOTE) The eGFR has been calculated using the CKD EPI equation. This calculation has not been validated in all clinical situations. eGFR's persistently <60 mL/min signify possible Chronic Kidney Disease.   Glucose, capillary     Status: Abnormal   Collection Time: 01/15/15  6:45 AM  Result Value Ref Range   Glucose-Capillary 115 (H) 65 - 99 mg/dL   Comment 1 Notify RN      HEENT: poor dentition Cardio: RRR and tachy Resp: CTA B/L and unlabored GI: BS positive and midline incision, good granulation tissue,, wet to dry dressingsRLQ colostomy Extremity:  Edema Right stump Skin:   Wound C/D/I Neuro: Alert/Oriented, Anxious and Abnormal Motor 4/5 bilateral deltoids, biceps, triceps, grip, 4/5 right hip flexor, 4/5 left hip flexor knee extensor ankle dorsiflexor Musc/Skel: mild tendernessat the left costal margin anteriorly Gen. no acute distress   Assessment/Plan: 1. Functional deficits secondary to right BKA in a patient deconditioned with metastatic adenocarcinoma of the  colon which require 3+ hours per day of interdisciplinary therapy in a comprehensive inpatient rehab setting. Physiatrist is providing close team supervision and 24 hour management of active medical problems listed below. Physiatrist and rehab team continue to assess barriers to discharge/monitor patient progress toward functional and medical goals.  FIM: FIM - Bathing Bathing Steps Patient Completed: Chest, Right Arm, Left Arm, Abdomen Bathing: 5: Set-up assist to: Obtain items (UB bathing only)  FIM - Upper Body Dressing/Undressing Upper body dressing/undressing steps patient completed: Thread/unthread right sleeve of pullover shirt/dresss, Thread/unthread left sleeve of pullover shirt/dress, Put head through opening of pull over shirt/dress, Pull shirt over trunk Upper body dressing/undressing: 5: Supervision: Safety issues/verbal cues FIM - Lower Body Dressing/Undressing Lower body dressing/undressing steps patient completed: Thread/unthread right pants leg, Thread/unthread left pants leg, Pull pants up/down, Don/Doff left sock Lower body dressing/undressing: 0: Activity did not occur  FIM - Toileting Toileting steps completed by patient: Performs perineal hygiene Toileting: 0: Activity did not occur  FIM - Radio producer Devices: Bedside commode, Sliding board Toilet Transfers: 5-To toilet/BSC: Supervision (verbal cues/safety issues), 5-From toilet/BSC: Supervision (verbal cues/safety issues)  FIM - Control and instrumentation engineer Devices: Sliding board Bed/Chair Transfer: 5: Supine > Sit: Supervision (verbal cues/safety issues), 4: Bed > Chair or W/C: Min A (steadying Pt. > 75%)  FIM - Locomotion: Wheelchair Distance: 150 Locomotion: Wheelchair: 6: Travels 150 ft or more, turns around, maneuvers to table, bed or toilet, negotiates 3% grade: maneuvers on rugs and over door sills independently FIM - Locomotion:  Ambulation Ambulation/Gait Assistance: Not tested (comment) (not safe at this time) Locomotion: Ambulation: 0: Activity did not occur  Comprehension Comprehension Mode: Auditory Comprehension: 6-Follows complex conversation/direction: With extra time/assistive device  Expression Expression Mode: Verbal Expression: 6-Expresses complex ideas: With extra time/assistive device  Social Interaction Social Interaction: 6-Interacts appropriately with others with medication or extra time (anti-anxiety, antidepressant).  Problem Solving Problem Solving: 4-Solves basic 75 - 89% of the time/requires cueing 10 - 24% of the time (set-up for transfers)  Memory Memory: 5-Recognizes or recalls 90% of the time/requires cueing < 10% of the time  Medical Problem List and Plan: 1. 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  DVT Prophylaxis/Anticoagulation: Subcutaneous Lovenox. Monitor platelet counts and any signs of bleeding,discussed with hematology.  Hgb stable  Dr Beryle Beams who recommends full dose anticoag with lovenox without conversion to warfarin given active metastatic CA, for at least 6 mo, given wt loss (?BKA) will reduce dose to $Remov'120mg'thpgPQ$  q24h                3. Pain Management: Neurontin 100 mg 3 times a day, Oxycodone. Monitor with increased mobility,left clavicular/shoulder pain with some tenderness, we'll check x-ray given history of metastatic adenocarcinoma 4. Mood/depression: Prozac 20 mg daily. 5. Neuropsych: This patient is capable of making decisions on her own behalf. 6. Skin/Wound Care: Local care to right BKA site as well as abdominal wound/ostomy and deep tissue injury to buttocks and sacrum.  WOC RN following for wound care. Has had some issues with seal of ostomy 7. Fluids/Electrolytes/Nutrition: Routine   I/Os  with follow-up chemistries, prob fluid overload will gently diurese 8. Acute blood loss anemia. Follow-up CBC stablelikely has some anemia of chronic disease as well 9. Diabetes mellitus with peripheral neuropathy. Hemoglobin A1c 6.3. Sliding scale insulin. Check blood sugars before meals and at bedtime 10. Hypertension. Norvasc 5 mg daily. 11.  Hypoxia improved,monitor her for desaturation in therapy  LOS (Days) 14 A FACE TO FACE EVALUATION WAS PERFORMED  Aurea Aronov E 01/15/2015, 10:27 AM

## 2015-01-15 NOTE — Progress Notes (Signed)
Physical Therapy Session Note  Patient Details  Name: Kathryn Knapp MRN: [id number] Date of Birth: 22-Nov-1940  Today's Date: 01/15/2015 PT Individual Time: 1300-1330  PT Individual Time Calculation (min): 30 min   Short Term Goals: Week 2:  PT Short Term Goal 1 (Week 2): Patient will perform bed mobility with stand by assistance  PT Short Term Goal 2 (Week 2): Patient will perform slide board transfer with min assist to a level surface PT Short Term Goal 3 (Week 2): Patient will stand with mod assist for 2 minutes with least restrictive assistive device.  PT Short Term Goal 4 (Week 2): Patient will propel wheelchair 150 feet over level surfaces with close supervision  PT Short Term Goal 5 (Week 2): Patient will be able to educate and perform residual limb care with no more than minimal assistance for insepection and residual limb wrapping.   Skilled Therapeutic Interventions/Progress Updates:   Session focused on functional mobility training and endurance. Patient propelled wheelchair using BUE x 130 ft with mod I and increased time. Performed UBE from wheelchair at level 1.0 for total of 8 min, alternating propelling forwards and backwards every 2 minutes with brief rest break x 4. Performed slide board transfer training wheelchair <> mat table with min A overall, assist for SB placement and verbal cues for forward weight shift and hip clearance as patient demonstrated shearing across board. Patient with increasing L subcostal pain throughout session which improved with patient applying pressure, denied need for pain medication. Patient left sitting in wheelchair with all needs within reach, family in room.    Therapy Documentation Precautions:  Precautions Precautions: Fall Precaution Comments: NWB R LE Restrictions Weight Bearing Restrictions: Yes RLE Weight Bearing: Non weight bearing Pain: Pain Assessment Pain Score: 8  Pain Type: Acute pain Pain Location: Rib cage Pain  Orientation: Left Pain Descriptors / Indicators: Aching Pain Onset: On-going Pain Intervention(s): Pressure, Repositioned  See FIM for current functional status  Therapy/Group: Individual Therapy  [name] 01/15/2015, 3:44 PM

## 2015-01-15 NOTE — Progress Notes (Signed)
Occupational Therapy Session Note  Patient Details  Name: Kathryn Knapp MRN: [id number] Date of Birth: 1941/02/27  Today's Date: 01/15/2015 OT Individual Time: 1415-1445 OT Individual Time Calculation (min): 30 min   Skilled Therapeutic Interventions/Progress Updates:    Session with focus on family education for sliding board transfers and scoot pivot transfers without the sliding board.  She was able to complete sliding board transfers with min assist and mod instructional cueing for technique.  She demonstrates decreased ability to perform forward trunk flexion and utilize the LUE efficiently to assist with the transfer.  Pt's daughter present for session and able to assist pt with multiple transfers using the sliding board with min guard to min assist as well as for scoot pivot transfers for simulated toilet transfers with min assist.  Educated daughter on proper body mechanics as well as to allow the pt to demonstrate forward weightshift.  Recommend continued practice of scoot pivot transfers with drop arm commode.    Therapy Documentation Precautions:  Precautions Precautions: Fall Precaution Comments: NWB R LE Restrictions Weight Bearing Restrictions: Yes RLE Weight Bearing: Non weight bearing  Pain: Pain Assessment Pain Assessment: Faces Pain Score: 8  Pain Type: Acute pain Pain Location: Rib cage Pain Orientation: Left Pain Descriptors / Indicators: Aching Pain Onset: On-going Pain Intervention(s): Medication (See eMAR);Repositioned ADL: See FIM for current functional status  Therapy/Group: Individual Therapy  [name] OTR/L 01/15/2015, 3:36 PM

## 2015-01-16 ENCOUNTER — Inpatient Hospital Stay (HOSPITAL_COMMUNITY): Payer: Medicare Other | Admitting: Physical Therapy

## 2015-01-16 ENCOUNTER — Inpatient Hospital Stay (HOSPITAL_COMMUNITY): Payer: Medicare Other | Admitting: Occupational Therapy

## 2015-01-16 LAB — CBC
HCT: 28.4 % — ABNORMAL LOW (ref 36.0–46.0)
Hemoglobin: 8.5 g/dL — ABNORMAL LOW (ref 12.0–15.0)
MCH: 25.8 pg — ABNORMAL LOW (ref 26.0–34.0)
MCHC: 29.9 g/dL — ABNORMAL LOW (ref 30.0–36.0)
MCV: 86.1 fL (ref 78.0–100.0)
Platelets: 721 10*3/uL — ABNORMAL HIGH (ref 150–400)
RBC: 3.3 MIL/uL — AB (ref 3.87–5.11)
RDW: 18.1 % — ABNORMAL HIGH (ref 11.5–15.5)
WBC: 8.9 10*3/uL (ref 4.0–10.5)

## 2015-01-16 LAB — GLUCOSE, CAPILLARY
GLUCOSE-CAPILLARY: 102 mg/dL — AB (ref 65–99)
GLUCOSE-CAPILLARY: 94 mg/dL (ref 65–99)
GLUCOSE-CAPILLARY: 93 mg/dL (ref 65–99)
Glucose-Capillary: 110 mg/dL — ABNORMAL HIGH (ref 65–99)

## 2015-01-16 NOTE — Progress Notes (Signed)
Social Work Patient ID: Kathryn Knapp, female   DOB: 12-08-1940, 74 y.o.   MRN: [id number] Met with pt to discuss discharge needs and trying to get medication assistance for one month by then her medicaid will be approved. Family coming in daily for education, different daughters' different days. Have contacted SW-Lay=uryn at [facility] to see what resources they have To assist pt.

## 2015-01-16 NOTE — Consult Note (Signed)
  NEUROPSYCHOLOGY NOTE - [facility]   MEDICAL NECESSITY:  Kathryn Knapp was seen on the [facility] for an initial diagnostic evaluation owing to the patient's diagnosis of right BKA.   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 right handed female with history of hypertension as well as diabetes mellitus. Independent prior to admission living with her daughter and grandchildren. Presented 12/16/2014 with nonspecific abdominal pain as well as nausea vomiting. He also had some recent weight loss. CT abdomen revealed a mass in the splenic flexure as well as nodule left adrenal gland.follow-up scan revealed some free fluid and air. Underwent exploratory laparotomy right transverse and partial left colectomy with en block partial gastrectomy, and ileostomy 12/17/2014 per Dr. Grandville Silos. Pathology report adenocarcinoma with metastases and await plan of care to be established as outpatient with oncology services."   During today's visit, Kathryn Knapp continues to deny any cognitive or emotional symptoms. She is adjusting well to her stay. Suicidal/homicidal ideation, plan or intent denied. No hallucinations. No behavioral disturbance. No barriers to therapy identified. She was accompanied by multiple family members who have been able to provide ample support throughout this endeavor. Patient is discharging this Friday and is happy to be doing so. She has made good progress in therapy. I see no reason for neuropsychology to follow-up anymore during this admission. She was encouraged to call upon our service as needed.   Rutha Bouchard, Psy.D.  Clinical Neuropsychologist

## 2015-01-16 NOTE — Progress Notes (Signed)
  Subjective/Complaints: Still with left subcostal pain anterior, no fall, twisted in therapy, pain comes and goes mainly with activity, no pain with breathing     Overall this is improving m analgesic creme helps, Heat > ice   ROS  Negative except for midline abdominal pain,which is improving   Objective: Vital Signs: Blood pressure 136/62, pulse 78, temperature 97.6 F (36.4 C), temperature source Oral, resp. rate 18, height 5' 11" (1.803 m), weight 81 kg (178 lb 9.2 oz), SpO2 97 %. No results found. Results for orders placed or performed during the hospital encounter of 01/01/15 (from the past 72 hour(s))  Glucose, capillary     Status: Abnormal   Collection Time: 01/13/15 12:00 PM  Result Value Ref Range   Glucose-Capillary 123 (H) 65 - 99 mg/dL   Comment 1 Notify RN   Glucose, capillary     Status: Abnormal   Collection Time: 01/13/15  4:51 PM  Result Value Ref Range   Glucose-Capillary 121 (H) 65 - 99 mg/dL   Comment 1 Notify RN   Glucose, capillary     Status: Abnormal   Collection Time: 01/13/15  9:05 PM  Result Value Ref Range   Glucose-Capillary 124 (H) 65 - 99 mg/dL  Glucose, capillary     Status: None   Collection Time: 01/14/15  6:55 AM  Result Value Ref Range   Glucose-Capillary 95 65 - 99 mg/dL  Glucose, capillary     Status: Abnormal   Collection Time: 01/14/15 11:49 AM  Result Value Ref Range   Glucose-Capillary 170 (H) 65 - 99 mg/dL  Glucose, capillary     Status: Abnormal   Collection Time: 01/14/15  4:29 PM  Result Value Ref Range   Glucose-Capillary 141 (H) 65 - 99 mg/dL  Glucose, capillary     Status: Abnormal   Collection Time: 01/14/15  9:03 PM  Result Value Ref Range   Glucose-Capillary 109 (H) 65 - 99 mg/dL   Comment 1 Notify RN   Creatinine, serum     Status: None   Collection Time: 01/15/15  6:21 AM  Result Value Ref Range   Creatinine, Ser 0.67 0.44 - 1.00 mg/dL   GFR calc non Af Amer >60 >60 mL/min   GFR calc Af Amer >60 >60 mL/min   Comment: (NOTE) The eGFR has been calculated using the CKD EPI equation. This calculation has not been validated in all clinical situations. eGFR's persistently <60 mL/min signify possible Chronic Kidney Disease.   Glucose, capillary     Status: Abnormal   Collection Time: 01/15/15  6:45 AM  Result Value Ref Range   Glucose-Capillary 115 (H) 65 - 99 mg/dL   Comment 1 Notify RN   Glucose, capillary     Status: Abnormal   Collection Time: 01/15/15 12:07 PM  Result Value Ref Range   Glucose-Capillary 124 (H) 65 - 99 mg/dL  Glucose, capillary     Status: Abnormal   Collection Time: 01/15/15  4:23 PM  Result Value Ref Range   Glucose-Capillary 106 (H) 65 - 99 mg/dL  Glucose, capillary     Status: Abnormal   Collection Time: 01/15/15  9:04 PM  Result Value Ref Range   Glucose-Capillary 121 (H) 65 - 99 mg/dL  CBC     Status: Abnormal   Collection Time: 01/16/15  5:24 AM  Result Value Ref Range   WBC 8.9 4.0 - 10.5 K/uL   RBC 3.30 (L) 3.87 - 5.11 MIL/uL   Hemoglobin 8.5 (L)   12.0 - 15.0 g/dL   HCT 28.4 (L) 36.0 - 46.0 %   MCV 86.1 78.0 - 100.0 fL   MCH 25.8 (L) 26.0 - 34.0 pg   MCHC 29.9 (L) 30.0 - 36.0 g/dL   RDW 18.1 (H) 11.5 - 15.5 %   Platelets 721 (H) 150 - 400 K/uL  Glucose, capillary     Status: Abnormal   Collection Time: 01/16/15  6:46 AM  Result Value Ref Range   Glucose-Capillary 102 (H) 65 - 99 mg/dL     HEENT: poor dentition Cardio: RRR and tachy Resp: CTA B/L and unlabored GI: BS positive and midline incision, good granulation tissue,, wet to dry dressingsRLQ colostomy Extremity:  Edema Right stump Skin:   Wound C/D/I Neuro: Alert/Oriented, Anxious and Abnormal Motor 4/5 bilateral deltoids, biceps, triceps, grip, 4/5 right hip flexor, 4/5 left hip flexor knee extensor ankle dorsiflexor Musc/Skel: mild tendernessat the left costal margin anteriorly Gen. no acute distress   Assessment/Plan: 1. Functional deficits secondary to right BKA in a patient  deconditioned with metastatic adenocarcinoma of the colon which require 3+ hours per day of interdisciplinary therapy in a comprehensive inpatient rehab setting. Physiatrist is providing close team supervision and 24 hour management of active medical problems listed below. Physiatrist and rehab team continue to assess barriers to discharge/monitor patient progress toward functional and medical goals.  FIM: FIM - Bathing Bathing Steps Patient Completed: Chest, Right Arm, Left Arm, Abdomen Bathing: 5: Set-up assist to: Obtain items (UB bathing only)  FIM - Upper Body Dressing/Undressing Upper body dressing/undressing steps patient completed: Thread/unthread right sleeve of pullover shirt/dresss, Thread/unthread left sleeve of pullover shirt/dress, Put head through opening of pull over shirt/dress, Pull shirt over trunk Upper body dressing/undressing: 5: Supervision: Safety issues/verbal cues FIM - Lower Body Dressing/Undressing Lower body dressing/undressing steps patient completed: Thread/unthread right pants leg, Thread/unthread left pants leg, Pull pants up/down, Don/Doff left sock Lower body dressing/undressing: 0: Activity did not occur  FIM - Toileting Toileting steps completed by patient: Performs perineal hygiene Toileting: 2: Max-Patient completed 1 of 3 steps  FIM - Radio producer Devices: Bedside commode, Sliding board Toilet Transfers: 5-To toilet/BSC: Supervision (verbal cues/safety issues), 5-From toilet/BSC: Supervision (verbal cues/safety issues)  FIM - Control and instrumentation engineer Devices: Sliding board Bed/Chair Transfer: 5: Supine > Sit: Supervision (verbal cues/safety issues), 5: Bed > Chair or W/C: Supervision (verbal cues/safety issues)  FIM - Locomotion: Wheelchair Distance: 200 Locomotion: Wheelchair: 6: Travels 150 ft or more, turns around, maneuvers to table, bed or toilet, negotiates 3% grade: maneuvers on rugs and  over door sills independently FIM - Locomotion: Ambulation Ambulation/Gait Assistance: Not tested (comment) (not safe at this time) Locomotion: Ambulation: 0: Activity did not occur  Comprehension Comprehension Mode: Auditory Comprehension: 6-Follows complex conversation/direction: With extra time/assistive device  Expression Expression Mode: Verbal Expression: 6-Expresses complex ideas: With extra time/assistive device  Social Interaction Social Interaction: 6-Interacts appropriately with others with medication or extra time (anti-anxiety, antidepressant).  Problem Solving Problem Solving: 5-Solves complex 90% of the time/cues < 10% of the time  Memory Memory: 5-Recognizes or recalls 90% of the time/requires cueing < 10% of the time  Medical Problem List and Plan: 1. 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  DVT Prophylaxis/Anticoagulation: Subcutaneous Lovenox. Monitor platelet counts and any signs of bleeding,discussed with hematology.  Hgb stable Dr Beryle Beams  who recommends full dose anticoag with lovenox without conversion to warfarin given active metastatic CA, for at least 6 mo, given wt loss (?BKA) will reduce dose to 120mg q24h                3. Pain Management: Neurontin 100 mg 3 times a day, Oxycodone. Monitor with increased mobility,left clavicular/shoulder pain with some tenderness, we'll check x-ray given history of metastatic adenocarcinoma 4. Mood/depression: Prozac 20 mg daily. 5. Neuropsych: This patient is capable of making decisions on her own behalf. 6. Skin/Wound Care: Local care to right BKA site as well as abdominal wound/ostomy and deep tissue injury to buttocks and sacrum.  WOC RN following for wound care. Has had some issues with seal of ostomy 7. Fluids/Electrolytes/Nutrition:  Routine  I/Os  with follow-up chemistries, prob fluid overload will gently diurese 8. Acute blood loss anemia. Follow-up CBC stablelikely has some anemia of chronic disease as well 9. Diabetes mellitus with peripheral neuropathy. Hemoglobin A1c 6.3. Sliding scale insulin. Check blood sugars before meals and at bedtime 10. Hypertension. Norvasc 5 mg daily. 11.  Hypoxia improved,monitor her for desaturation in therapy  LOS (Days) 15 A FACE TO FACE EVALUATION WAS PERFORMED  KIRSTEINS,ANDREW E 01/16/2015, 9:31 AM    

## 2015-01-16 NOTE — Progress Notes (Signed)
Occupational Therapy Session Note  Patient Details  Name: Kathryn Knapp MRN: [id number] Date of Birth: 17-Mar-1941  Today's Date: 01/16/2015 OT Individual Time: 4128-7867 and 1100-1200 OT Individual Time Calculation (min): 60 min and 60 min   Short Term Goals: Week 2:  OT Short Term Goal 1 (Week 2): Pt will complete toilet transfer to drop arm BSC with slide board with min assist OT Short Term Goal 2 (Week 2): Pt will complete toileting tasks with lateral leans with min assist  OT Short Term Goal 3 (Week 2): Pt will complete bathing with lateral leans with supervision OT Short Term Goal 4 (Week 2): Pt will complete LB dressing with lateral leans with min assist  Skilled Therapeutic Interventions/Progress Updates:    Session One: Pt seen for ADL session focusing on functional mobility and transfers Pr in supine upon arrival, agreeable to tx, and inquiring if breakfast was on the unit. Pt transferred supine> EOB with VCs for technique. She sat on EOB and attempted to eat breakfast, hwover, voiced increased pain in L ribs and having no appetite. Pt completed grooming task seated in w/c at the sink, declining bathing and dressing. Pt self propelled w/c towards ADL apartment, she voiced need for toileting task, taken remainder of way with total A for time management. Pt completed sliding board transfer to standard toilet with 3-1 placed over toilet with assist for set-up. Pt requires cues for proper set up of w/c and board prior to transfer. Pt completed toileting task with max A for clothing management and using lateral leans to complete hygiene. Pt returned to w/c and completed simulated w/c management in the kitchen, accessing refrigerator and cabinets to obtain items, and fixed drink at the table top level. Pt educated regarding use of reacher in the kitchen, and voiced understanding and that she had a reacher at home. Pt self propelled to therapy gym and transferred onto mat with same assist as  mentioned above. Pt sat unsupported on mat and completed ball toss activity, required to weight shift to catch ball in order to increase functional dynamic sitting balance. Pt left sitting on mat at end of session with hand off to PT.  Pt educated regarding use of reacher during functional transfers, w/c set-up, use of sliding board, DME, need to plan for good and bad days, and d/c planning.     Session Two: Pt seen for OT session focusing on w/c management, functional activity tolerance, and functional transfers. Pt in w/c upon arrival, fatigued but agreeable to tx. Pt self propelled w/c throughout unit supervision-mod I for UE strengthening and activity tolerance, to ADL apartment. Pt educated regarding tub transfer bench use and function. Pt completed simulated tub shower transfer with assist for set-up of w/c and sliding board. Pt then voiced desire to practice w/c management going up ramp as she has a ramped entrance into her home, and decling further practice with transfers due to pain and fatigue. Pt completed w/c mobility up 3 degree incline on hospital unit with supervision and rest break following. Pt then negoitated practice ramp in gym, requiring increased assist to get over lip of ramp. Pt negotiated ramp x4 trials, requiring assist for all. Pt then completed w/c negotiation around cones, required to weave in and out, able to do so with supervision-mod I demonstrating good safety awareness and management of w/c. Trials completed on tile and carpeted surfaces. Pt completed transfer onto therapy mat using sliding board. Pt voiced increased pain in L rib cage upon  completing functional transfers. Pain disipates with rest. Pt completed functional reaching task seated unsupported on EOM, demonstrating functional sitting balance. Pt returned to room at end of session, left sitting in w/c with all needs in reach.    HEP using theraband reviewed with pt, pt able to recall and demonstrate exercises taught  in previous session. Min VCs provided for proper form.   Therapy Documentation Precautions:  Precautions Precautions: Fall Precaution Comments: NWB R LE Restrictions Weight Bearing Restrictions: Yes RLE Weight Bearing: Non weight bearing Pain: Pain Assessment Pain Assessment: 0-10 Pain Score: 8  Faces Pain Scale: Hurts whole lot Pain Type: Acute pain Pain Location: Rib cage Pain Orientation: Left Pain Descriptors / Indicators: Aching Pain Frequency: Constant Pain Onset: On-going Patients Stated Pain Goal: 3 Pain Intervention(s): Repositioned;Ambulation/increased activity Multiple Pain Sites: No  See FIM for current functional status  Therapy/Group: Individual Therapy  Lewis, Airi Copado C 01/16/2015, 7:20 AM

## 2015-01-16 NOTE — Progress Notes (Signed)
ANTICOAGULATION CONSULT NOTE - Follow Up Consult  Pharmacy Consult for lovenox Indication: atrial fibrillation  No Known Allergies  Patient Measurements: Height: 5\' 11"  (180.3 cm) Weight: 178 lb 9.2 oz (81 kg) IBW/kg (Calculated) : 70.8 Heparin Dosing Weight:   Vital Signs: Temp: 97.6 F (36.4 C) (05/31 0544) Temp Source: Oral (05/31 0544) BP: 136/62 mmHg (05/31 0544) Pulse Rate: 78 (05/31 0544)  Labs:  Recent Labs  01/15/15 0621 01/16/15 0524  HGB  --  8.5*  HCT  --  28.4*  PLT  --  721*  CREATININE 0.67  --     Estimated Creatinine Clearance: 70 mL/min (by C-G formula based on Cr of 0.67).   Medications:  Scheduled:  . amLODipine  5 mg Oral Daily  . docusate sodium  100 mg Oral Daily  . enoxaparin (LOVENOX) injection  120 mg Subcutaneous Q24H  . feeding supplement (ENSURE ENLIVE)  237 mL Oral BID BM  . FLUoxetine  20 mg Oral Daily  . gabapentin  100 mg Oral TID  . insulin aspart  0-9 Units Subcutaneous TID WC & HS  . pantoprazole  40 mg Oral Daily   Infusions:    Assessment: 74 yo female with hx of afib and malignancy is currently on treatment dose of lovenox.  Hgb 8.5 low stable, Plt 721 K. Scr 0.67, est. crcl ~ 70 ml/min.  Goal of Therapy:  Anti-Xa level 0.6-1 units/ml 4hrs after LMWH dose given Monitor platelets by anticoagulation protocol: Yes   Plan:  - Continue lovenox 120 mg sq q24h - CBC every 72 hours  Maryanna Shape, PharmD, BCPS  Clinical Pharmacist  Pager: [phone]   01/16/2015,12:57 PM

## 2015-01-16 NOTE — Consult Note (Addendum)
WOC ostomy follow up [facility] was previously following for assessment and plan of care to abd wound. Bedside nurse states the ostomy pouch was changed last night and is intact with a good seal this AM.  Supplies ordered to room for staff nurse use.  Pt has participated in several pouch change demonstrations.  Recommend home health assistance after discharge.  Will continue to follow for further teaching sessions while in the hospital. Julien Girt MSN, RN, [place], [place], [place]

## 2015-01-16 NOTE — Progress Notes (Signed)
Physical Therapy Weekly Progress Note  Patient Details  Name: CANESHA TESFAYE MRN: [id number] Date of Birth: 1941/06/18  Beginning of progress report period: Jan 09, 2015 End of progress report period: Jan 16, 2015  Today's Date: 01/16/2015 PT Individual Time: 0830-0945 PT Individual Time Calculation (min): 75 min   Patient has met 5 of 5 short term goals.   Patient continues to demonstrate the following deficits: decreased cardiovascular endurance, strength, dynamic balance, and therefore will continue to benefit from skilled PT intervention to enhance overall performance with activity tolerance, balance, postural control, attention, awareness, knowledge of precautions and residual limb care, and wheelchair management and mobility.   Patient progressing toward long term goals. Continue plan of care.  Patient has made significant progress in all areas of her physical therapy. Patient able to begin to direct transfers and functional mobility.Patient has made significant improvements with residual limb care and transfers. Patient also presents with decreased anxiety. Patient requires decreased rest breaks during session. Family training has been initiated however more hands-on training will be required prior to discharge.  Patient progressing toward long term goals..  Continue plan of care.  PT Short Term Goals Week 3:  PT Short Term Goal 1 (Week 3): STG equal LTG   Therapy Documentation Precautions:  Precautions Precautions: Fall Precaution Comments: NWB R LE Restrictions Weight Bearing Restrictions: Yes RLE Weight Bearing: Non weight bearing Pain: Pain Assessment Pain Assessment: 0-10 Pain Score: 8  Faces Pain Scale: Hurts whole lot Pain Type: Acute pain Pain Location: Rib cage Pain Orientation: Left Pain Descriptors / Indicators: Aching Pain Frequency: Constant Pain Onset: On-going Patients Stated Pain Goal: 3 Pain Intervention(s): Repositioned;Ambulation/increased  activity Multiple Pain Sites: No  Patient received from OT Amy seated edge of mat. Patient performed lateral scooting on mat with close supervision and verbal cues for technique.   Short sit to and from supine on mat close supervision.  Supine there ex: B Hip flexion 4x10 B hip abduction 4x10 B quad sets 40x with a 5 second hold  Patient propelled wheelchair 250 feetx2 mod I over level tile surface, carpet and 3% grade.   Patient performed car transfer with slide board min assist to the car and mod assist from car to wheelchair. Patient able to recall set-up and technique with increased time and minimal cues.  Patient performed  transfer with slide board to and from couch and wheelchair min assist. Patient able to recall set-up and technique with increased time and minimal cues.  Patient performed right residual limb care, inspection and shaping with use of ACE wrap. Review education, process and technique for residual limb inspection with long handled mirror. Patient provided ACE wraps to perform residual limb wrapping and shaping. Patient required min cues for proper technique. Patient continues to demonstrate improved residual limb care.   Education provided for additional family training with daughters, patient states daughters should be here tomorrow. Reviewed education and plan of care, patient in agreement with progress and discharge recommendations as well as need for further family training.   Vitals monitored and remained stable throughout session and responded appropriately with activity. Patient resting in wheelchair at end of session with call bell and phone in reach and all needs met.    See FIM for current functional status  Therapy/Group: Individual Therapy  [name] 01/16/2015, 8:47 AM

## 2015-01-17 ENCOUNTER — Inpatient Hospital Stay (HOSPITAL_COMMUNITY): Payer: Medicare Other | Admitting: Occupational Therapy

## 2015-01-17 ENCOUNTER — Inpatient Hospital Stay (HOSPITAL_COMMUNITY): Payer: Medicare Other | Admitting: Physical Therapy

## 2015-01-17 ENCOUNTER — Inpatient Hospital Stay (HOSPITAL_COMMUNITY): Payer: Medicare Other

## 2015-01-17 LAB — GLUCOSE, CAPILLARY
GLUCOSE-CAPILLARY: 95 mg/dL (ref 65–99)
GLUCOSE-CAPILLARY: 128 mg/dL — AB (ref 65–99)
Glucose-Capillary: 117 mg/dL — ABNORMAL HIGH (ref 65–99)
Glucose-Capillary: 135 mg/dL — ABNORMAL HIGH (ref 65–99)

## 2015-01-17 NOTE — Progress Notes (Signed)
Occupational Therapy Note  Patient Details  Name: Kathryn Knapp MRN: [id number] Date of Birth: [birth year]  Today's Date: 01/17/2015 OT Individual Time: 0930-1030 OT Individual Time Calculation (min): 60 min   Pt denied pain initially; 8/10 pain in right rib cage with activity; repostioned, massage Individual Therapy  Pt resting in w/c upon arrival and agreeable to therapy.  Pt propelled to ADL apartment and practiced w/c<>drop arm BSC transfers with sliding board X 3.  Pt also practiced simulated donning/doffing pants while seated on BSC X 2. Pt required assistance steadying the sliding board during transfer.  Pt experienced significant discomfort in right rib cage with transfers and clothing management but was able to reposition and perform self massage which relieved discomfort.  Pt required multiple rest breaks for pain management.  Focus on increased independence with BSC transfers and clothing management.    Leotis Shames [facility] 01/17/2015, 10:34 AM

## 2015-01-17 NOTE — Consult Note (Signed)
WOC ostomy follow up Pt denies further discomfort with moisture-associated buttock wounds.Mod amt liquid brown stool in pouch. Stoma red and viable when visualized through pouch. Offered to perform pouch change demonstration again, but patient states she feels that she is independent with this and has applied pouch and barrier rings twice during the hospital stay.  She also states she has been able to empty by herself.  Discussed pouching routines and ordering supplies.  She denies further questions.  Free samples have been sent to her house, and 4 more each of barrier rings and one piece pouches are at the bedside with educational materials.  She denies further questions regarding ostomy care at this time.  Please re-consult if further assistance is needed.  Thank-you,  Julien Girt MSN, [place], [place], [place], [place]

## 2015-01-17 NOTE — Progress Notes (Signed)
Left side pain Subjective/Complaints:  3-4 day history of left sided pain initially when she twisted. No pain with breathing. No shortness of breath. No falls. She gets relief with oral pain medications as well as muscle relaxers as well as heat greater than ice. In addition the analgesic cream has been of great benefit as well  ROS  Negative except for midline abdominal pain,which is improving   Objective: Vital Signs: Blood pressure 139/58, pulse 82, temperature 98.2 F (36.8 C), temperature source Oral, resp. rate 20, height $RemoveBe'5\' 11"'oeVczYjTG$  (1.803 m), weight 56.518 kg (124 lb 9.6 oz), SpO2 98 %. No results found. Results for orders placed or performed during the hospital encounter of 01/01/15 (from the past 72 hour(s))  Glucose, capillary     Status: Abnormal   Collection Time: 01/14/15 11:49 AM  Result Value Ref Range   Glucose-Capillary 170 (H) 65 - 99 mg/dL  Glucose, capillary     Status: Abnormal   Collection Time: 01/14/15  4:29 PM  Result Value Ref Range   Glucose-Capillary 141 (H) 65 - 99 mg/dL  Glucose, capillary     Status: Abnormal   Collection Time: 01/14/15  9:03 PM  Result Value Ref Range   Glucose-Capillary 109 (H) 65 - 99 mg/dL   Comment 1 Notify RN   Creatinine, serum     Status: None   Collection Time: 01/15/15  6:21 AM  Result Value Ref Range   Creatinine, Ser 0.67 0.44 - 1.00 mg/dL   GFR calc non Af Amer >60 >60 mL/min   GFR calc Af Amer >60 >60 mL/min    Comment: (NOTE) The eGFR has been calculated using the CKD EPI equation. This calculation has not been validated in all clinical situations. eGFR's persistently <60 mL/min signify possible Chronic Kidney Disease.   Glucose, capillary     Status: Abnormal   Collection Time: 01/15/15  6:45 AM  Result Value Ref Range   Glucose-Capillary 115 (H) 65 - 99 mg/dL   Comment 1 Notify RN   Glucose, capillary     Status: Abnormal   Collection Time: 01/15/15 12:07 PM  Result Value Ref Range   Glucose-Capillary 124  (H) 65 - 99 mg/dL  Glucose, capillary     Status: Abnormal   Collection Time: 01/15/15  4:23 PM  Result Value Ref Range   Glucose-Capillary 106 (H) 65 - 99 mg/dL  Glucose, capillary     Status: Abnormal   Collection Time: 01/15/15  9:04 PM  Result Value Ref Range   Glucose-Capillary 121 (H) 65 - 99 mg/dL  CBC     Status: Abnormal   Collection Time: 01/16/15  5:24 AM  Result Value Ref Range   WBC 8.9 4.0 - 10.5 K/uL   RBC 3.30 (L) 3.87 - 5.11 MIL/uL   Hemoglobin 8.5 (L) 12.0 - 15.0 g/dL   HCT 28.4 (L) 36.0 - 46.0 %   MCV 86.1 78.0 - 100.0 fL   MCH 25.8 (L) 26.0 - 34.0 pg   MCHC 29.9 (L) 30.0 - 36.0 g/dL   RDW 18.1 (H) 11.5 - 15.5 %   Platelets 721 (H) 150 - 400 K/uL  Glucose, capillary     Status: Abnormal   Collection Time: 01/16/15  6:46 AM  Result Value Ref Range   Glucose-Capillary 102 (H) 65 - 99 mg/dL  Glucose, capillary     Status: Abnormal   Collection Time: 01/16/15 12:27 PM  Result Value Ref Range   Glucose-Capillary 110 (H) 65 - 99  mg/dL  Glucose, capillary     Status: None   Collection Time: 01/16/15  4:25 PM  Result Value Ref Range   Glucose-Capillary 94 65 - 99 mg/dL  Glucose, capillary     Status: None   Collection Time: 01/16/15  9:20 PM  Result Value Ref Range   Glucose-Capillary 93 65 - 99 mg/dL   Comment 1 Notify RN   Glucose, capillary     Status: None   Collection Time: 01/17/15  6:52 AM  Result Value Ref Range   Glucose-Capillary 95 65 - 99 mg/dL     HEENT: poor dentition Cardio: RRR and tachy Resp: CTA B/L and unlabored GI: BS positive and midline incision, good granulation tissue,, wet to dry dressingsRLQ colostomy Extremity:  Edema Right stump Skin:   Wound C/D/I Neuro: Alert/Oriented, Anxious and Abnormal Motor 4/5 bilateral deltoids, biceps, triceps, grip, 4/5 right hip flexor, 4/5 left hip flexor knee extensor ankle dorsiflexor Musc/Skel: mild tenderness at the left costal margin anteriorly Gen. no acute distress   Assessment/Plan: 1.  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 Physiatrist is providing close team supervision and 24 hour management of active medical problems listed below. Physiatrist and rehab team continue to assess barriers to discharge/monitor patient progress toward functional and medical goals.  Team conference today please see physician documentation under team conference tab, met with team face-to-face to discuss problems,progress, and goals. Formulized individual treatment plan based on medical history, underlying problem and comorbidities. FIM: FIM - Bathing Bathing Steps Patient Completed: Chest, Right Arm, Left Arm, Abdomen, Front perineal area, Buttocks, Right upper leg, Left upper leg, Left lower leg (including foot) Bathing: 5: Set-up assist to: Obtain items  FIM - Upper Body Dressing/Undressing Upper body dressing/undressing steps patient completed: Thread/unthread right sleeve of pullover shirt/dresss, Thread/unthread left sleeve of pullover shirt/dress, Put head through opening of pull over shirt/dress, Pull shirt over trunk Upper body dressing/undressing: 5: Set-up assist to: Obtain clothing/put away FIM - Lower Body Dressing/Undressing Lower body dressing/undressing steps patient completed: Thread/unthread right pants leg, Thread/unthread left pants leg, Pull pants up/down, Don/Doff left sock Lower body dressing/undressing: 5: Set-up assist to: Obtain clothing  FIM - Toileting Toileting steps completed by patient: Performs perineal hygiene Toileting: 2: Max-Patient completed 1 of 3 steps  FIM - Radio producer Devices: Bedside commode, Sliding board Toilet Transfers: 5-To toilet/BSC: Supervision (verbal cues/safety issues), 5-From toilet/BSC: Supervision (verbal cues/safety issues)  FIM -  Control and instrumentation engineer Devices: Sliding board Bed/Chair Transfer: 6: Supine > Sit: No assist, 6: Sit > Supine: No assist, 4: Bed > Chair or W/C: Min A (steadying Pt. > 75%)  FIM - Locomotion: Wheelchair Distance: 200 Locomotion: Wheelchair: 6: Travels 150 ft or more, turns around, maneuvers to table, bed or toilet, negotiates 3% grade: maneuvers on rugs and over door sills independently FIM - Locomotion: Ambulation Ambulation/Gait Assistance: Not tested (comment) (not safe at this time) Locomotion: Ambulation: 0: Activity did not occur  Comprehension Comprehension Mode: Auditory Comprehension: 6-Follows complex conversation/direction: With extra time/assistive device  Expression Expression Mode: Verbal Expression: 6-Expresses complex ideas: With extra time/assistive device  Social Interaction Social Interaction: 6-Interacts appropriately with others with medication or extra time (anti-anxiety, antidepressant).  Problem Solving Problem Solving: 5-Solves complex 90% of the time/cues < 10% of the time  Memory Memory: 5-Recognizes or recalls 90% of the  time/requires cueing < 10% of the time  Medical Problem List and Plan: 1. 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  DVT Prophylaxis/Anticoagulation: Subcutaneous Lovenox. Monitor platelet counts and any signs of bleeding,discussed with hematology.  Hgb stable Dr Beryle Beams who recommends full dose anticoag with lovenox without conversion to warfarin given active metastatic CA, for at least 6 mo, given wt loss (?BKA) will reduce dose to $Remov'120mg'ZuDiot$  q24h                3. Pain Management: Neurontin 100 mg 3 times a day, Oxycodone. Monitor with increased mobility,left clavicular/shoulder pain with some tenderness, we'll check x-ray given history  of metastatic adenocarcinoma 4. Mood/depression: Prozac 20 mg daily. 5. Neuropsych: This patient is capable of making decisions on her own behalf. 6. Skin/Wound Care: Local care to right BKA site as well as abdominal wound/ostomy and deep tissue injury to buttocks and sacrum.  WOC RN following for wound care. Has had some issues with seal of ostomy 7. Fluids/Electrolytes/Nutrition: Routine  I/Os  with follow-up chemistries, prob fluid overload will gently diurese 8. Acute blood loss anemia. Follow-up CBC stablelikely has some anemia of chronic disease as well 9. Diabetes mellitus with peripheral neuropathy. Hemoglobin A1c 6.3. Sliding scale insulin. Check blood sugars before meals and at bedtime 10. Hypertension. Norvasc 5 mg daily. 11.  Hypoxia improved,monitor her for desaturation in therapy 12. Abdominal oblique muscle strainEmma continue counter irritant cream, continue heat as well as muscle relaxers. Expect improvement over the next several days LOS (Days) [place] E 01/17/2015, 10:27 AM

## 2015-01-17 NOTE — Progress Notes (Signed)
Occupational Therapy Weekly Progress Note  Patient Details  Name: Kathryn Knapp MRN: [id number] Date of Birth: [birth year]  Beginning of progress report period: Jan 09, 2015 End of progress report period: January 17, 2015  Today's Date: 01/17/2015 OT Individual Time: 6270-3500 and 1300-1330 OT Individual Time Calculation (min): 60 min and 30 min   Patient has met 3 of 4 short term goals.  Pt is making steady progress towards goals.  She currently requires min/steady assist for transfers with use of slide board and assist to position slide board appropriately.  Pt fluctuates with ability to complete clothing management in the context of toileting requiring between min to max assist depending on clothing choice and level of fatigue and pain.  Family education has begun with daughter, pt and daughter would benefit from additional education prior to d/c.  Patient continues to demonstrate the following deficits: decreased cardiovascular endurance, activity tolerance, strength, dynamic sitting balance and therefore will continue to benefit from skilled OT intervention to enhance overall performance with BADL and Reduce care partner burden.  Patient progressing toward long term goals..  Continue plan of care.  OT Short Term Goals Week 2:  OT Short Term Goal 1 (Week 2): Pt will complete toilet transfer to drop arm BSC with slide board with min assist OT Short Term Goal 1 - Progress (Week 2): Met OT Short Term Goal 2 (Week 2): Pt will complete toileting tasks with lateral leans with min assist  OT Short Term Goal 2 - Progress (Week 2): Progressing toward goal OT Short Term Goal 3 (Week 2): Pt will complete bathing with lateral leans with supervision OT Short Term Goal 3 - Progress (Week 2): Met OT Short Term Goal 4 (Week 2): Pt will complete LB dressing with lateral leans with min assist OT Short Term Goal 4 - Progress (Week 2): Met Week 3:  OT Short Term Goal 1 (Week 3): STG = LTGs due to remaining  LOS  Skilled Therapeutic Interventions/Progress Updates:    1) Engaged in ADL retraining with focus on lateral leans, transfers, residual limb wrapping, and increased carryover of education with self-care tasks.  Pt in bed upon arrival reporting RN planning to change abdomen dressing after bath, therefore requesting to bathe and dress at EOB.  Pt completed all tasks with setup of supplies.  Educated on residual limb wrapping with pt requiring mod cues and assist for proper technique.  Transferred bed > w/c with use of slide board with assist to properly position board and then steady assist during transfer with tactile cues for forward weight shift.  Engaged in w/c mobility on unit with pt opening doors and maneuvering w/c around furniture and obstacles on tile and carpeted surfaces.  Family not present this session for scheduled family education.  2) Engaged in therapeutic activity with focus on functional transfers and dynamic sitting balance.  Pt completed transfer bed > w/c with slide board with steady assist with setup for proper positioning.  Pt propelled w/c to therapy gym approx 150 feet with no rest breaks.  Squat pivot w/c > therapy mat with mod assist.  Engaged in dynamic sitting balance with ball bouncing and tossing while seated at edge of mat to further challenge sitting balance as needed for self-care tasks.  Pt with one instance of shooting pain Lt side with lateral leans, diminished with pressure just under ribcage.  Pt passed off to PT, notified PT of pain in side.  Therapy Documentation Precautions:  Precautions Precautions: Fall Precaution  Comments: NWB R LE Restrictions Weight Bearing Restrictions: Yes RLE Weight Bearing: Non weight bearing Pain: Pain Assessment Pain Assessment: 0-10 Pain Score: 3  Pain Type: Acute pain Pain Location: Abdomen Pain Orientation: Left Pain Descriptors / Indicators: Aching  See FIM for current functional status  Therapy/Group: Individual  Therapy  Simonne Come 01/17/2015, 9:12 AM

## 2015-01-17 NOTE — Progress Notes (Signed)
[facility] dropped off ordered DME for patient, including wheelchair, cushion, and leg extender board.  Patient signed for equipment.  Brita Romp, RN

## 2015-01-17 NOTE — Patient Care Conference (Signed)
Inpatient RehabilitationTeam Conference and Plan of Care Update Date: 01/17/2015   Time: 10;40 AM    Patient Name: Kathryn Knapp      Medical Record Number: [id number]  Date of Birth: March 13, 1941 Sex: Female         Room/Bed: 4W12C/4W12C-01 Payor Info: Payor: MEDICARE / Plan: MEDICARE PART A / Product Type: *No Product type* /    Admitting Diagnosis: R BKA AND RECENT EXPL LAP COLOSTOMY  Admit Date/Time:  01/01/2015  2:07 PM Admission Comments: No comment available   Primary Diagnosis:  Amputation of right lower extremity below knee Principal Problem: Amputation of right lower extremity below knee  Patient Active Problem List   Diagnosis Date Noted  . Severe muscle deconditioning 01/11/2015  . Amputation of right lower extremity below knee 01/01/2015  . S/P exploratory laparotomy   . S/P unilateral BKA (below knee amputation)   . Abdominal pain   . Pain in joint, lower leg   . Palliative care encounter   . Acute respiratory failure with hypoxia   . Thrombus   . Acute renal failure syndrome   . Peritonitis   . Colon cancer metastasized to multiple sites   . Acute blood loss anemia   . Melena   . Diabetes type 2, controlled   . Hypokalemia   . Hypomagnesemia   . Depression   . Noncompliance with medication regimen 12/23/2014  . S/P partial colectomy 12/17/2014  . Severe sepsis with acute organ dysfunction 12/17/2014  . Septic shock   . Bowel obstruction 12/16/2014  . Colonic mass 12/16/2014    Expected Discharge Date: Expected Discharge Date: 01/19/15  Team Members Present: Physician leading conference: Dr. Alysia Penna Social Worker Present: Ovidio Kin, LCSW Nurse Present: Heather Roberts, RN PT Present: Raylene Everts, PT;Caroline Lacinda Axon, PT OT Present: Simonne Come, Dorothyann Gibbs, OT SLP Present: Windell Moulding, SLP PPS Coordinator present : Daiva Nakayama, RN, CRRN     Current Status/Progress Goal Weekly Team Focus  Medical   no pain c/o,   home with family assist  pt /  family teaching   Bowel/Bladder   Continent of bladder; new ileostomy- leaks primarily at night- watery stool  Min assist  Educate on ileostomy care- have patient perform as much care to ostomy as possible   Swallow/Nutrition/ Hydration     na        ADL's   min assist transfers with slide board, max-min assist toileting, supervision bathing and dressing.  Focus on lateral leans for LB self-care tasks and carryover of placement and position of slide board and w/c  supervision overall, min assist transfers  lateral leans, transfers, residual limb care, family education   Mobility   min assist transfers with slide, min assist residual limb care, mod I wheelchair mobility level surfaces  supervision to min assist  transfers, wheelchair management, car transfers, residual limb care, family training    Communication     na        Safety/Cognition/ Behavioral Observations    no unsafe behaviors        Pain   C/o left abdomen pain- muscle rub cream helps; pain in stump- percocet 2 tabs q4h prn; neurontin 100mg   tid  < 4  Assess and treat for pain q shift and prn   Skin   Midline incision with pink tissue, thick yellow slough.  Performing wet to dry dressing changes daily; staples to stump; redness to sacrum from old healing pressure ulcer healing  mod assist  Assess skin q shift and prn; perform dressing changes as ordered.      *See Care Plan and progress notes for long and short-term goals.  Barriers to Discharge: family with transportation difficulties    Possible Resolutions to Barriers:  family training    Discharge Planning/Teaching Needs:  Family education on-going this week.  Trying to find medication assistance due to pt has no insurance coverage and medicaid still pending.       Team Discussion:  Learning ileostomy care and stump wrapping. Family education this week. Working on medication assist. Low hemoglobin-MD reports fine. Medically stable for discharge Friday. Lucianne Lei broke  down trying to get fixed for this week  Revisions to Treatment Plan:  None   Continued Need for Acute Rehabilitation Level of Care: The patient requires daily medical management by a physician with specialized training in physical medicine and rehabilitation for the following conditions: Daily direction of a multidisciplinary physical rehabilitation program to ensure safe treatment while eliciting the highest outcome that is of practical value to the patient.: Yes Daily medical management of patient stability for increased activity during participation in an intensive rehabilitation regime.: Yes Daily analysis of laboratory values and/or radiology reports with any subsequent need for medication adjustment of medical intervention for : Neurological problems  Jabez Molner, Gardiner Rhyme 01/18/2015, 8:53 AM

## 2015-01-17 NOTE — Progress Notes (Signed)
Physical Therapy Session Note  Patient Details  Name: Kathryn Knapp MRN: [id number] Date of Birth: October 06, 1940  Today's Date: 01/17/2015 PT Individual Time: 1330-1430 PT Individual Time Calculation (min): 60 min   Short Term Goals: Week 3:  PT Short Term Goal 1 (Week 3): STG equal LTG  Skilled Therapeutic Interventions/Progress Updates:   Session focused on transfer training and residual limb care. Patient sitting edge of mat, handoff from OT. Patient reporting fear of pain in L trunk, educated on musculoskeletal nature of pain as it is provoked with movement and relieved with pressure or rest.   Performed seated pushups with BUE on yoga blocks and therapist blocking LLE to prevent sliding, 2 x 10 with seated rest in between. Performed lateral scooting to R and L x 2 in each direction along edge of mat with supervision. Patient required min verbal cues for forward lean to facilitate bottom clearance and pushing through LLE.   Sit <> stand from slightly raised mat using RW with max A and able to maintain standing x 1.5 min with min A overall.   Reviewed key objectives of residual limb wrapping and shaping and provided patient with handout. Patient performed R residual limb wrapping with mod cues for technique and sequencing.   Performed slide board transfer mat > wheelchair > bed with patient able to direct care and setup assist for slide board placement. Patient demonstrated good carryover of bottom clearance and able to manage wheelchair arm rest and doff/don amputee pad with supervision. Patient propelled wheelchair back to room with mod I and left semi reclined in bed with all needs within reach, NT present.   Therapy Documentation Precautions Precautions: Fall Precaution Comments: NWB R LE Restrictions Weight Bearing Restrictions: Yes RLE Weight Bearing: Non weight bearing Pain: Pain Assessment Pain Assessment: No/denies pain  See FIM for current functional status  Therapy/Group:  Individual Therapy  [name] 01/17/2015, 2:23 PM

## 2015-01-18 ENCOUNTER — Inpatient Hospital Stay (HOSPITAL_COMMUNITY): Payer: Medicare Other

## 2015-01-18 ENCOUNTER — Inpatient Hospital Stay (HOSPITAL_COMMUNITY): Payer: Medicare Other | Admitting: Occupational Therapy

## 2015-01-18 ENCOUNTER — Inpatient Hospital Stay (HOSPITAL_COMMUNITY): Payer: Medicare Other | Admitting: Physical Therapy

## 2015-01-18 DIAGNOSIS — M542 Cervicalgia: Secondary | ICD-10-CM

## 2015-01-18 LAB — GLUCOSE, CAPILLARY
GLUCOSE-CAPILLARY: 119 mg/dL — AB (ref 65–99)
GLUCOSE-CAPILLARY: 102 mg/dL — AB (ref 65–99)
Glucose-Capillary: 104 mg/dL — ABNORMAL HIGH (ref 65–99)
Glucose-Capillary: 133 mg/dL — ABNORMAL HIGH (ref 65–99)

## 2015-01-18 NOTE — Discharge Summary (Signed)
Kathryn Knapp, DEMONT NO.:  [id number]  MEDICAL RECORD NO.:  [id number]  LOCATION:  [id number]                        FACILITY:  [facility]  PHYSICIAN:  Charlett Blake, M.D.DATE OF BIRTH:  10/20/1940  DATE OF ADMISSION:  01/01/2015 DATE OF DISCHARGE:  01/19/2015                              DISCHARGE SUMMARY   DISCHARGE DIAGNOSES: 1. 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 2. Pathology report, adenocarcinoma with metastasis. 3. Subcutaneous Lovenox, chronic. 4. Pain management. 5. Acute blood loss anemia. 6. Diabetes mellitus, peripheral neuropathy. 7. Hypertension. 8. Hypoxia.  A 74 year old right-handed female, history of hypertension, diabetes mellitus, was independent prior to admission, living with her daughter and grandchildren, who presented on December 16, 2014, with nonspecific abdominal pain as well as nausea, vomiting.  She had also had recent weight loss.  CT abdomen revealed a mass in the splenic flexure as well as nodule, left adrenal gland.  Abdominal pain progressed.  Followup scan revealed some free fluid and air.  Underwent exploratory laparotomy, right transverse and partial left colectomy with gastrectomy and ileostomy per Dr. Georganna Skeans.  Pathology report adenocarcinoma with metastasis and plan was to follow up outpatient with Oncology Service, Dr. Annamaria Boots.  The patient required ventilatory management followed by Critical Care Medicine.  Wound care followup for sacral decubitus. Extubated on Dec 21, 2014, with slow progress.  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 Bridgett Larsson.  Acute blood loss anemia 7.2 to 9.6, monitored.  Placed on Lovenox 120 mg daily in light of suspect cardiac embolism leading to right below-knee amputation, not a Coumadin candidate due to active metastatic cancer.  The patient was admitted for comprehensive rehab program.  PAST MEDICAL HISTORY:  See discharge diagnoses.  SOCIAL HISTORY:  Lives with family, independent prior to admission.  Functional status upon admission to rehab services was +2 supine to sit; max assist, sit to supine; mod max assist, activities of daily living.  PHYSICAL EXAMINATION:  VITAL SIGNS:  Blood pressure 136/55, pulse 88, temperature 97, respirations 18. GENERAL:  This was an alert female, flat affect. LUNGS:  Clear to auscultation. CARDIAC:  Regular rate and rhythm. ABDOMEN:  Soft, nontender.  Good bowel sounds. WOUND EXAM:  Amputation site intact, mild serosanguineous drainage. Large abdominal incision clean, granulating, large dressing in place. Ostomy right lower quadrant sealed and intact.  Sacral and buttocks wound dressed.  REHABILITATION HOSPITAL COURSE:  Patient was admitted to inpatient rehab services with therapies initiated on a 3-hour daily basis consisting of physical therapy, occupational therapy, and rehabilitation nursing.  The following issues were addressed during the patient's rehabilitation stay.  Pertaining to [name] right below-knee amputation thought to be due to cardiac embolism after bowel obstruction of which she had undergone exploratory laparotomy, colectomy, ileostomy with a pathology report adenocarcinoma with metastasis.  She would follow up  with Vascular Surgery in relation to her right below-knee amputation.  She would follow up with Dr. Burr Medico of Oncology Services in respect to adenocarcinoma and discuss plan of care.  She was maintained on subcutaneous Lovenox 120 mg daily, monitoring of platelet counts, Dr. Beryle Beams, recommended full-dose  anticoagulation with Lovenox without conversion to Coumadin, given active metastatic cancer for at least 6 months.  This could be addressed with Oncology Services an outpatient. Pain management ongoing with the use of Neurontin and oxycodone with good results.  She continued on Prozac for depression with emotional support provided.  Diabetes mellitus, peripheral neuropathy.  Hemoglobin A1c 6.3 and monitored.  Blood pressure remained well controlled.  All issues in regard to skin care ongoing with wound care nurse for her ostomy as well as sacral decubitus.  The patient received weekly collaborative interdisciplinary team conferences to discuss estimated length of stay, family teaching, and any barriers to her discharge. Sessions focused on transfer training and residual limb care.  Patient sitting edge of bed, performed lateral scooting to the right and left with supervision, sit-to-stand from a slightly raised mat with a rolling walker max assist.  Performed sliding board transfers, mat to wheelchair to bed with patient able to direct her care and set up assist for sliding board placement.  Activities of daily living and homemaking, she could propel her wheelchair to the ADL apartment, practice wheelchair, drop-arm bedside, commode transfers with sliding board.  Patient also practised simulated donning and doffing her pants while seated.  Ongoing followup by wound care nurse for ostomy care as well as sacral decubitus.  Full family teaching was completed and plan discharge to home.  DISCHARGE MEDICATIONS:  Included: 1. Norvasc 5 mg p.o. daily. 2. Colace 100 mg p.o. daily. 3. Lovenox 90 mg subcutaneous daily. 4. Prozac 20 mg p.o. daily. 5. Neurontin 100 mg p.o. t.i.d. 6. Robaxin 500 mg p.o. every 6 hours as needed. 7. Oxycodone 1-2 tablets every 4 as needed pain, dispense of 90     tablets. 8. Protonix 40 mg p.o. daily. 9. Mylicon as needed.  DIET:  Diabetic diet.  Wound care  was wet-to-dry dressings.  Midline abdominal wound with ABD pad to incision.  Stump sock or tubular gauze to hold ABD in place. Foam dressing to buttocks change every 5 days as needed for soiling, routine ostomy care.  The patient would follow up Dr. Alysia Penna at the outpatient rehab service office on February 27, 2015, Dr. Georganna Skeans, Surgery call for appointment; Dr. Burr Medico, Oncology Services to discuss pathology report; Dr. Adele Barthel 2 weeks call for appointment.     Lauraine Rinne, P.A.   ______________________________ Charlett Blake, M.D.    DA/MEDQ  D:  01/18/2015  T:  01/18/2015  Job:  [id number]  cc:   Dr. Jamelle Rushing, MD [place]. Grandville Silos, M.D.

## 2015-01-18 NOTE — Discharge Instructions (Signed)
Inpatient Rehab Discharge Instructions  Kathryn Knapp Discharge date and time: No discharge date for patient encounter.   Activities/Precautions/ Functional Status: Activity: activity as tolerated Diet: diabetic diet Wound Care: keep wound clean and dry Functional status:  ___ No restrictions     ___ Walk up steps independently ___ 24/7 supervision/assistance   ___ Walk up steps with assistance ___ Intermittent supervision/assistance  ___ Bathe/dress independently ___ Walk with walker     ___ Bathe/dress with assistance ___ Walk Independently    ___ Shower independently __x_ Walk with assistance    ___ Shower with assistance ___ No alcohol     ___ Return to work/school ________  Special Instructions:     COMMUNITY REFERRALS UPON DISCHARGE:    Home Health:   PT, OT, RN, [place]   Date of last service:01/19/2015   Medical Equipment/Items Ordered:WHEELCHAIR, DROP-ARM BEDSIDE COMMODE, [place] MEDICAID-DAUGHTER TO CONTINUE TO WORK ON THIS TO GO TO [place] [phone] TO FILL OUT PAPERWORK TO OBTAIN PCP  GENERAL COMMUNITY RESOURCES FOR PATIENT/FAMILY: Support [place]  My questions have been answered and I understand these instructions. I will adhere to these goals and the provided educational materials after my discharge from the hospital.  Patient/Caregiver Signature _______________________________ Date __________  Clinician Signature _______________________________________ Date __________  Please bring this form and your medication list with you to all your follow-up doctor's appointments.

## 2015-01-18 NOTE — Plan of Care (Signed)
Problem: RH PAIN MANAGEMENT Goal: RH STG PAIN MANAGED AT OR BELOW PT'S PAIN GOAL Less than 3,on scale 1 to 10.  Outcome: Not Progressing Pain 10/10 and has been persistent throughout day per report.

## 2015-01-18 NOTE — Progress Notes (Signed)
Physical Therapy Session Note  Patient Details  Name: Kathryn Knapp MRN: [id number] Date of Birth: 06-01-1941  Today's Date: 01/18/2015 PT Individual Time: 0933-1028 PT Individual Time Calculation (min): 55 min   Short Term Goals: Week 3:  PT Short Term Goal 1 (Week 3): STG equal LTG  Skilled Therapeutic Interventions/Progress Updates:      Therapy Documentation Precautions:  Precautions Precautions: Fall Precaution Comments: NWB R LE Restrictions Weight Bearing Restrictions: Yes RLE Weight Bearing: Non weight bearing (RLE)   Patient denies any pain.   Patient propelled wheelchair 250 feetx2 mod I over level tile surface and carpet.   Patient performed car transfer with slide board min assist for transfer and set-up. Patient able to recall set-up and technique with increased time and minimal cues.  Patient performed transfer with slide board to and from bed and wheelchair min assist. Patient able to recall set-up and technique with increased time.  Short sit to and from supine on bed mod I for increased time to complete.  Patient performed right residual limb care, inspection and shaping with use of ACE wrap. Review education, process and technique for residual limb inspection with long handled mirror. Patient provided ACE wraps to perform residual limb wrapping and shaping. Patient required min cues for proper technique. Patient continues to demonstrate improved residual limb care.   Sit to and from stand transfer min assist.  Patient stood min assist for two minutes. Therapist positioned in front of patient.  Vitals monitored and remained stable throughout session and responded appropriately with activity. Patient resting in wheelchair at end of session with call bell and phone in reach and all needs met.   See FIM for current functional status  Therapy/Group: Individual Therapy  [name] 01/18/2015, 10:37 AM

## 2015-01-18 NOTE — Progress Notes (Signed)
Occupational Therapy Note  Patient Details  Name: Kathryn Knapp MRN: [id number] Date of Birth: 1940/09/16  Today's Date: 01/18/2015 OT Individual Time: 1400-1430 OT Individual Time Calculation (min): 30 min   Pt c/o sharp pain in lower left rib cage; 5-10/10 pain; ongoing; repositioning Individual Therapy  Pt propelled to therapy gym and engaged in BUE therex on [employer] to increase endurance and independence with BADLs and w/c mobility.  Pt completed 15 mins at random (work load 2) with 4 rest breaks.  Pt propelled back to room and remained in w/c with all needs within reach.     Leotis Shames [facility] 01/18/2015, 2:53 PM

## 2015-01-18 NOTE — Discharge Summary (Signed)
Discharge summary job [id number]

## 2015-01-18 NOTE — Progress Notes (Signed)
Occupational Therapy Discharge Summary  Patient Details  Name: Kathryn Knapp MRN: [id number] Date of Birth: 1941-03-16  Patient has met 6 of 6 long term goals due to improved activity tolerance, improved balance, postural control, ability to compensate for deficits and improved awareness.  Patient to discharge at [facility] Assist level with transfers and toileting, supervision with self-care tasks.  Patient's care partner is independent to provide the necessary physical assistance at discharge.  Hands on family education completed with patient's two daughters with focus on self-care tasks, transfers with use of slide board, and limb wrapping.  Reasons goals not met: NA  Recommendation:  Patient will benefit from ongoing skilled OT services in home health setting to continue to advance functional skills in the area of BADL and Reduce care partner burden.  Equipment: drop arm BSC, w/c  Reasons for discharge: treatment goals met and discharge from hospital  Patient/family agrees with progress made and goals achieved: Yes  OT Discharge Precautions/Restrictions  Precautions Precautions: Fall Precaution Comments: NWB R LE Restrictions Weight Bearing Restrictions: Yes RLE Weight Bearing: Non weight bearing Vital Signs Therapy Vitals Temp: 98 F (36.7 C) Temp Source: Oral Pulse Rate: 79 Resp: 18 BP: 123/63 mmHg Patient Position (if appropriate): Lying Oxygen Therapy SpO2: 100 % O2 Device: Not Delivered Pain Pain Assessment Pain Assessment: 0-10 Pain Score: Asleep Pain Type: Acute pain Pain Location: Rib cage Pain Orientation: Left Pain Descriptors / Indicators: Aching Pain Onset: On-going Pain Intervention(s): Medication (See eMAR) Multiple Pain Sites: No ADL  See FIM Vision/Perception  Vision- History Baseline Vision/History: Wears glasses Wears Glasses: Reading only Patient Visual Report: No change from baseline Vision- Assessment Vision Assessment?: No apparent  visual deficits  Cognition Overall Cognitive Status: Within Functional Limits for tasks assessed Arousal/Alertness: Awake/alert Orientation Level: Oriented to person;Oriented to place;Oriented to time;Oriented to situation Attention: Sustained Sustained Attention: Appears intact Memory: Appears intact Awareness: Appears intact Problem Solving: Appears intact Reasoning: Appears intact Behaviors: Poor frustration tolerance Safety/Judgment: Appears intact Sensation Sensation Light Touch: Appears Intact Stereognosis: Not tested Hot/Cold: Appears Intact Proprioception: Appears Intact Coordination Gross Motor Movements are Fluid and Coordinated: Yes Fine Motor Movements are Fluid and Coordinated: No (contracture Rt digits 2-4) Motor  Motor Motor: Within Functional Limits Extremity/Trunk Assessment RUE Assessment RUE Assessment: Exceptions to [facility] (ROM WFL, strength grossly 4/5) LUE Assessment LUE Assessment: Exceptions to [facility] (ROM WFL, strength grossly 4/5)  See FIM for current functional status  [name], [facility] 01/18/2015, 4:01 PM

## 2015-01-18 NOTE — Progress Notes (Signed)
Occupational Therapy Session Note  Patient Details  Name: Kathryn Knapp MRN: [id number] Date of Birth: 05/27/1941  Today's Date: 01/18/2015 OT Individual Time: 5462-7035 and 0093-8182 OT Individual Time Calculation (min): 45 min and 65 min   Short Term Goals: Week 2:  OT Short Term Goal 1 (Week 2): Pt will complete toilet transfer to drop arm BSC with slide board with min assist OT Short Term Goal 1 - Progress (Week 2): Met OT Short Term Goal 2 (Week 2): Pt will complete toileting tasks with lateral leans with min assist  OT Short Term Goal 2 - Progress (Week 2): Progressing toward goal OT Short Term Goal 3 (Week 2): Pt will complete bathing with lateral leans with supervision OT Short Term Goal 3 - Progress (Week 2): Met OT Short Term Goal 4 (Week 2): Pt will complete LB dressing with lateral leans with min assist OT Short Term Goal 4 - Progress (Week 2): Met  Skilled Therapeutic Interventions/Progress Updates:    1) Engaged in ADL retraining with pt completing self-care tasks of bathing and dressing at EOB at overall supervision level with use of lateral leans for LB dressing.  Pt required increased time with shorts this session but able to complete.  Educated on residual limb wrapping with pt requiring min cues and assist for proper technique with much improved carryover of education.  Demonstration of limb wrapping with pt completing approx 80% of 2nd attempt.  Utilized Corporate treasurer to inspect residual limb at surgical site and notified RN of small red scab.  Pt passed off to PT still seated at EOB.  Pt missed 15 mins secondary to therapist obtaining proper drop arm BSC in preparation for d/c home and speaking with SWK and primary PT regarding no family present for family education and change in drop arm BSC.  2) Engaged in hands on family education with pt's two daughters and granddaughter.  Primary focus on transfers to/from drop arm BSC and education on residual limb wrapping with  daughter, [name] (primary caregiver).  Pt's daughter demonstrated carryover of education of basic slide board transfers with appropriate placement of board and body positioning in preparation for transfer to drop arm BSC.  Educated on positioning and parts management, daughter return demonstrated transfers x2 on and off BSC.  Pt's other daughter, demonstrated transfer on/off BSC with initial cues for hand and board placement but with appropriate return demonstration on transfer back to w/c.  Granddaughter completed slide board transfer w/c > bed with initial cues for board placement but then no further cues for technique.  All educated on proper limb wrapping technique and provided with hand outs and [name] demonstrated appropriate limb wrapping with min cues for technique.  Pt reports pain in Lt side but able to complete all transfers at min assist level with rest breaks to allow pain to subside while completing education with family.  Therapy Documentation Precautions:  Precautions Precautions: Fall Precaution Comments: NWB R LE Restrictions Weight Bearing Restrictions: Yes RLE Weight Bearing: Non weight bearing (RLE) General: General OT Amount of Missed Time: 15 Minutes Vital Signs:  Pain: Pain Assessment Pain Assessment: 0-10 Pain Score: 10-Worst pain ever Pain Type: Acute pain Pain Location: Abdomen Pain Orientation: Left Pain Descriptors / Indicators: Aching Pain Onset: On-going Pain Intervention(s): Medication (See eMAR)  See FIM for current functional status  Therapy/Group: Individual Therapy  Simonne Come 01/18/2015, 10:12 AM

## 2015-01-18 NOTE — Progress Notes (Signed)
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  Severe muscle deconditioning  01/11/2015   .  Amputation of right lower extremity below knee  01/01/2015   .  S/P exploratory laparotomy     .  S/P unilateral BKA (below knee amputation)     .  Abdominal pain     .  Pain in joint, lower leg     .  Palliative care encounter     .  Acute respiratory failure with hypoxia     .  Thrombus     .  Acute renal failure syndrome     .  Peritonitis     .  Colon cancer metastasized to multiple sites     .  Acute blood loss anemia     .  Melena     .  Diabetes type 2, controlled     .  Hypokalemia     .  Hypomagnesemia     .  Depression     .  Noncompliance with medication regimen  12/23/2014   .  S/P partial colectomy  12/17/2014   .  Severe sepsis with acute organ dysfunction  12/17/2014   .  Septic shock     .  Bowel obstruction  12/16/2014   .  Colonic mass  12/16/2014     Expected Discharge Date: Expected Discharge Date: 01/19/15  Team Members Present: Physician leading conference: Dr. Alysia Penna Social Worker Present: Ovidio Kin, LCSW Nurse Present: Heather Roberts, RN PT Present: Raylene Everts, PT;Caroline Lacinda Axon, PT OT Present: Simonne Come,  Dorothyann Gibbs, OT SLP Present: Windell Moulding, SLP PPS Coordinator present : Daiva Nakayama, RN, CRRN        Current Status/Progress  Goal  Weekly Team Focus   Medical     no pain c/o,   home with family assist  pt / family teaching   Bowel/Bladder     Continent of bladder; new ileostomy- leaks primarily at night- watery stool  Min assist  Educate on ileostomy care- have patient perform as much care to ostomy as possible   Swallow/Nutrition/ Hydration       na         ADL's     min assist transfers with slide board, max-min assist toileting, supervision bathing and dressing.  Focus on lateral leans for LB self-care tasks and carryover of placement and position of slide board and w/c  supervision overall, min assist transfers   lateral leans, transfers, residual limb care, family education    Mobility     min assist transfers with slide, min assist residual limb care, mod  I wheelchair mobility level surfaces  supervision to min assist  transfers, wheelchair management, car transfers, residual limb care, family training    Communication       na         Safety/Cognition/ Behavioral Observations      no unsafe behaviors         Pain     C/o left abdomen pain- muscle rub cream helps; pain in stump- percocet 2 tabs q4h prn; neurontin 100mg   tid  < 4  Assess and treat for pain q shift and prn   Skin     Midline incision with pink tissue, thick yellow slough.  Performing wet to dry dressing changes daily; staples to stump; redness to sacrum from old healing pressure ulcer healing  mod assist  Assess skin q shift and prn; perform dressing changes as ordered.      *See Care Plan and progress notes for long and short-term goals.    Barriers to Discharge:  family with transportation difficulties      Possible Resolutions to Barriers:   family training     Discharge Planning/Teaching Needs:   Family education on-going this week.  Trying to find medication assistance due to pt has no  insurance coverage and medicaid still pending.        Team Discussion:    Learning ileostomy care and stump wrapping. Family education this week. Working on medication assist. Low hemoglobin-MD reports fine. Medically stable for discharge Friday. Lucianne Lei broke down trying to get fixed for this week   Revisions to Treatment Plan:    None    Continued Need for Acute Rehabilitation Level of Care: The patient requires daily medical management by a physician with specialized training in physical medicine and rehabilitation for the following conditions: Daily direction of a multidisciplinary physical rehabilitation program to ensure safe treatment while eliciting the highest outcome that is of practical value to the patient.: Yes Daily medical management of patient stability for increased activity during participation in an intensive rehabilitation regime.: Yes Daily analysis of laboratory values and/or radiology reports with any subsequent need for medication adjustment of medical intervention for : Neurological problems  [name] 01/18/2015, 8:53 AM                 [name], LCSW Social Worker Signed  Patient Care Conference 01/10/2015  1:38 PM    Expand All Collapse All   Inpatient RehabilitationTeam Conference and Plan of Care Update Date: 01/10/2015   Time: 11;10 am     Patient Name: Kathryn Knapp       Medical Record Number: [id number]  Date of Birth: July 10, 1941 Sex: Female         Room/Bed: 4W12C/4W12C-01 Payor Info: Payor: MEDICARE / Plan: MEDICARE PART A / Product Type: *No Product type* /    Admitting Diagnosis: 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  Severe muscle deconditioning  01/11/2015   .   Amputation of right lower extremity below knee  01/01/2015   .  S/P exploratory laparotomy     .  S/P unilateral BKA (below knee amputation)     .  Abdominal pain     .  Pain in joint, lower leg     .  Palliative care encounter     .  Acute respiratory failure with hypoxia     .  Thrombus     .  Acute renal failure syndrome     .  Peritonitis     .  Colon cancer metastasized to multiple sites     .  Acute blood loss anemia     .  Melena     .  Diabetes type 2, controlled     .  Hypokalemia     .  Hypomagnesemia     .  Depression     .  Noncompliance with medication regimen  12/23/2014   .  S/P partial colectomy  12/17/2014   .  Severe sepsis with acute organ dysfunction  12/17/2014   .  Septic shock     .  Bowel obstruction  12/16/2014   .  Colonic mass  12/16/2014     Expected Discharge Date: Expected Discharge Date: 01/19/15  Team Members Present: Physician leading conference: Dr. Alysia Penna Social Worker Present: Ovidio Kin, LCSW Nurse Present: Heather Roberts, RN PT Present: Raylene Everts, PT;Rodney Annie Main, PT OT Present: Meriel Pica, OT;Sarah Hoxie, OT;Jennifer Tamala Julian, OT SLP Present: Windell Moulding, SLP PPS Coordinator present : Daiva Nakayama, RN, CRRN        Current Status/Progress  Goal  Weekly Team Focus   Medical     Poor endurance, hypoxia resolved, adjustment disorder, not depression after evaluation from neuropsych  Maintain medical stability for home discharge   Increase ability to self manage BKA stump    Bowel/Bladder     Continent urine, Illeostomy patent   Manage illeostomy with min. assist.   Educate re: care.   Swallow/Nutrition/ Hydration       NA         ADL's     mod assist transfers with slide board, max assist toileting, mod assist LB dressing.  Focus on lateral leans for LB self-care tasks at this time  supervision overall, min assist transfers   transfers, residual limb care, dynamic sitting balance, lateral leans   Mobility      min to mod assist with slide board transfers, supervision wheelchair mobility over level surfaces. mod assist residual llimb care.    supervisiion to min assist  transfers, resdiual limb care, wheelchair management and mobility, standing balance   Communication       NA         Safety/Cognition/ Behavioral Observations    No unsafe behaviors, anticipates needs; mild anxiety at times.   No falls, injury this admission.  Calls, anticipates needs  Monitor   Pain     Left shoulder pain, right BKA incisional pain, phantom pain; Percocet, 2 tabs. 3-4/day.  managed at goal 2/10  Offer prns, continue education r/t alternante methods of pain control.   Skin     Abd. incision with pink tissue, yellow at base. minimal yellowish drainage, no odor. SHearing injuy buttocks healing; BKA incision C.D.I.. with staples.  Healing process at discharge. No s/s infection.   Monitor, continue current plan of care       *See Care Plan and progress notes for long and short-term goals.    Barriers to Discharge:  Multiple medical issues, decreased endurance      Possible Resolutions to Barriers:   Continue rehabilitation,  continue education      Discharge Planning/Teaching Needs:   Home with daughter and  grnad-children have been here to observe in therapies. Pt beginning to learn colostomy care       Team Discussion:    Making progress in therapies-shoulder pain-x-ray ordered. Urgency form lasix using bedpan takes too long to use bedside commode. Learning stump wrapping and ileostomy care.  Decreasing lasix does. Phantom pain being managed.  Need for family education next week   Revisions to Treatment Plan:    None    Continued Need for Acute Rehabilitation Level of Care: The patient requires daily medical management by a physician with specialized training in physical medicine and rehabilitation for the following conditions: Daily direction of a multidisciplinary physical rehabilitation program to ensure  safe treatment while eliciting the highest outcome that is of practical value to the patient.: Yes Daily medical management of patient stability for increased activity during participation in an intensive rehabilitation regime.: Yes Daily analysis of laboratory values and/or radiology reports with any subsequent need for medication adjustment of medical intervention for : Other;Pulmonary problems  [name] 01/12/2015, 8:56 AM                 [name], LCSW Social Worker Signed  Patient Care Conference 01/03/2015 12:46 PM    Expand All Collapse All   Inpatient RehabilitationTeam Conference and Plan of Care Update Date: 01/03/2015   Time: 10;50 AM     Patient Name: Kathryn Knapp       Medical Record Number: [id number]  Date of Birth: [birth year] Sex: Female         Room/Bed: 4W12C/4W12C-01 Payor Info: Payor: MEDICARE / Plan: MEDICARE PART A / Product Type: *No Product type* /    Admitting Diagnosis: 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  Amputation of right lower extremity below knee  01/01/2015   .  S/P exploratory laparotomy     .  S/P unilateral BKA (below knee amputation)     .  Abdominal pain     .  Pain in joint, lower leg     .  Palliative care encounter     .  Acute respiratory failure with hypoxia     .  Thrombus     .  Acute renal failure syndrome     .  Peritonitis     .  Colon cancer metastasized to multiple sites     .  Acute blood loss anemia     .  Melena     .  Diabetes type 2, controlled     .  Hypokalemia     .  Hypomagnesemia     .  Depression     .  Noncompliance with medication regimen  12/23/2014   .  S/P partial colectomy  12/17/2014   .  Severe sepsis with acute  organ dysfunction  12/17/2014   .  Septic shock     .  Bowel obstruction  12/16/2014   .  Colonic mass  12/16/2014     Expected Discharge Date: Expected Discharge Date: 01/19/15  Team Members Present: Physician leading conference: Dr. Alysia Penna Social Worker Present: Ovidio Kin, LCSW Nurse Present: Heather Roberts, RN PT Present: Jorge Mandril, Renaye Rakers, PT OT  Present: Simonne Come, Dorothyann Gibbs, OT SLP Present: Windell Moulding, SLP PPS Coordinator present : Daiva Nakayama, RN, CRRN        Current Status/Progress  Goal  Weekly Team Focus   Medical     poor endurance , hypoxia, depression   maintain medical stability for home d/c   build endurance   Bowel/Bladder     Continent urine; Illeostomy patent   Manage illeostomy min assist  Educate, moniotr   Swallow/Nutrition/ Hydration               ADL's     +2 for toileting and LB dressing at sit > stand level, max assist slide board transfer, min assist dynamic sitting balance   supervision overall, min assist transfers   transfers, residual limb care, dynamic sitting balance    Mobility     Max asssit for slide board transfer, total assist for residual limb care and standing, min assist wheelchair mobility.   SUpervision to min assist overall    Transfers, wheelchair management/mobility, residual limb care     Communication       na         Safety/Cognition/ Behavioral Observations      no unsafe behaviors         Pain     Phantom pain rt. BKA;  Abd. pain, 2 percocet 2-3 times day, effective.  Managed at goal  Offer PRNs, monitor.  Educatet on phantom pain, methods for relief.   Skin     Abd. incision wet to dry dressing; yellow tissue at base, moderate amount yellowish drainage, no odor. Shearing injury lt buttocks, resolving MASD; Rt. BKA with stables CDI, mild redness at line.   Healng process at D/C.  Monitor, reposition      *See Care Plan and progress notes for long and short-term goals.    Barriers to  Discharge:  se above     Possible Resolutions to Barriers:   cont rehab     Discharge Planning/Teaching Needs:   Home with daughter and grand children-family very committed to pt and will do whatever is needed for her       Team Discussion:    Goals-supervision/min wheelchair level-building up endurance.  Learning ostomy care.  Off O2 today. Chest x-ray-volume overload-staring lasix today, but will watch potassium level. Neuro-psych to see for coping.  Doing better today than yesterday.   Revisions to Treatment Plan:    None    Continued Need for Acute Rehabilitation Level of Care: The patient requires daily medical management by a physician with specialized training in physical medicine and rehabilitation for the following conditions: Daily medical management of patient stability for increased activity during participation in an intensive rehabilitation regime.: Yes Daily analysis of laboratory values and/or radiology reports with any subsequent need for medication adjustment of medical intervention for : Post surgical problems;Other  [name] 01/04/2015, 8:34 AM                  Patient ID: Nonah Mattes, female   DOB: 24-Dec-1940, 74 y.o.   MRN: [id number]

## 2015-01-18 NOTE — Progress Notes (Signed)
Physical Therapy Make-Up Session Note  Patient Details  Name: Kathryn Knapp MRN: [id number] Date of Birth: 08-13-1941  Today's Date: 01/18/2015 PT Individual Time: 1300-1350 PT Individual Time Calculation (min): 50 min   Short Term Goals: Week 3:  PT Short Term Goal 1 (Week 3): STG equal LTG  Skilled Therapeutic Interventions/Progress Updates:    Therapeutic Activity: PT instructs pt in supine to sit transfer from flat bed without rails mod I and scoot transfer with slideboard to pt's L bed to w/c req set up assist for slideboard placement and verbal cues to lean forward to get bottom clearance and prevent shearing.  Family present for car transfer training with slideboard in actual [place]. PT demonstrates a scoot transfer w/c to/from front seat of actual minivan, verbalizing hand placement for pt, body positioning for family members to assist, w/c set up, and slideboard placement, as well as timing for pt placing foot in/out of car. PT completes this transfer with min A from PT and 2nd assist stabilizing w/c for safety. Family demonstrates back understanding a safe transfer in the same manner.   Community Integration: PT instructs pt in w/c propulsion to outside, including up/down ramp mod I with B UEs, on/off elevator req verbal cues to turn head and back onto elevator and set up to hold elevator doors, and mod I on uneven surface outside on brick/concrete.   Family has been trained in actual [place] car transfer and pt demonstrates w/c mobility on ramp mod I. Continue per PT POC.   Therapy Documentation Precautions:  Precautions Precautions: Fall Precaution Comments: NWB R LE Restrictions Weight Bearing Restrictions: Yes RLE Weight Bearing: Non weight bearing (RLE)  Pain: Pain Assessment Pain Assessment: 0-10 Pain Score: 10-Worst pain ever Pain Type: Acute pain Pain Location: Rib cage Pain Orientation: Left Pain Descriptors / Indicators: Aching Pain Onset: On-going Pain  Intervention(s): Medication (See eMAR) Multiple Pain Sites: No  See FIM for current functional status  Therapy/Group: Individual Therapy  Janaya Broy M 01/18/2015, 1:05 PM

## 2015-01-18 NOTE — Progress Notes (Signed)
Social Work Patient ID: Kathryn Knapp, female   DOB: 25-Apr-1941, 74 y.o.   MRN: [id number] Met with pt and daughter who was here to learn her care.  Discussed medicines and cost, [facility]. Equipment being delivered to her room, daughter to take wheelchair home today, so less to take home tomorrow.  Daughter is aware of resources in [place] for medications and supplies.  Pt and daughter report it is going well and pt so ready to go home.

## 2015-01-18 NOTE — Progress Notes (Signed)
Social Work Patient ID: Kathryn Knapp, female   DOB: 06/18/1941, 74 y.o.   MRN: [id number] [place] with MD regarding difficulty obtaining Lovenox for pt, patient assistance could take up to 9 business days to receive. He contacted [facility] and [facility] of Lovenox Have contacted pharmacist to obtain medication.  She will leave at desk for pt, will pick up later today. Working on other medications. Have contacted Medicaid worker in [place] to see if Medicaid approved.

## 2015-01-18 NOTE — Progress Notes (Signed)
Chief complaint: Headache as well as left-sided neck pain Subjective/Complaints: 74 year old female with metastatic colon carcinoma who complains of a headache that lasted for a couple seconds last night. She also states that she had some pain along the left side of her neck while in bed last night. No trauma reported. Patient is worried that this could be cancer related.  Also discussed with social work that patient has no insurance coverage for Lovenox and was wondering if there are any alternatives. Spoke with hematology Dr. Beryle Beams who is not the treating oncologist recommended calling the cancer center to see if Lovenox samples would be available given that the patient has a scheduled appointment with the oncologist Dr. Annamaria Boots on June 9   ROS  Negative except for midline abdominal pain,which is improving   Objective: Vital Signs: Blood pressure 153/66, pulse 82, temperature 97.9 F (36.6 C), temperature source Oral, resp. rate 17, height 5\' 11"  (1.803 m), weight 56.518 kg (124 lb 9.6 oz), SpO2 69 %. No results found. Results for orders placed or performed during the hospital encounter of 01/01/15 (from the past 72 hour(s))  Glucose, capillary     Status: Abnormal   Collection Time: 01/15/15 12:07 PM  Result Value Ref Range   Glucose-Capillary 124 (H) 65 - 99 mg/dL  Glucose, capillary     Status: Abnormal   Collection Time: 01/15/15  4:23 PM  Result Value Ref Range   Glucose-Capillary 106 (H) 65 - 99 mg/dL  Glucose, capillary     Status: Abnormal   Collection Time: 01/15/15  9:04 PM  Result Value Ref Range   Glucose-Capillary 121 (H) 65 - 99 mg/dL  CBC     Status: Abnormal   Collection Time: 01/16/15  5:24 AM  Result Value Ref Range   WBC 8.9 4.0 - 10.5 K/uL   RBC 3.30 (L) 3.87 - 5.11 MIL/uL   Hemoglobin 8.5 (L) 12.0 - 15.0 g/dL   HCT 28.4 (L) 36.0 - 46.0 %   MCV 86.1 78.0 - 100.0 fL   MCH 25.8 (L) 26.0 - 34.0 pg   MCHC 29.9 (L) 30.0 - 36.0 g/dL   RDW 18.1 (H) 11.5 -  15.5 %   Platelets 721 (H) 150 - 400 K/uL  Glucose, capillary     Status: Abnormal   Collection Time: 01/16/15  6:46 AM  Result Value Ref Range   Glucose-Capillary 102 (H) 65 - 99 mg/dL  Glucose, capillary     Status: Abnormal   Collection Time: 01/16/15 12:27 PM  Result Value Ref Range   Glucose-Capillary 110 (H) 65 - 99 mg/dL  Glucose, capillary     Status: None   Collection Time: 01/16/15  4:25 PM  Result Value Ref Range   Glucose-Capillary 94 65 - 99 mg/dL  Glucose, capillary     Status: None   Collection Time: 01/16/15  9:20 PM  Result Value Ref Range   Glucose-Capillary 93 65 - 99 mg/dL   Comment 1 Notify RN   Glucose, capillary     Status: None   Collection Time: 01/17/15  6:52 AM  Result Value Ref Range   Glucose-Capillary 95 65 - 99 mg/dL  Glucose, capillary     Status: Abnormal   Collection Time: 01/17/15 11:06 AM  Result Value Ref Range   Glucose-Capillary 117 (H) 65 - 99 mg/dL  Glucose, capillary     Status: Abnormal   Collection Time: 01/17/15  4:41 PM  Result Value Ref Range   Glucose-Capillary 128 (H)  65 - 99 mg/dL  Glucose, capillary     Status: Abnormal   Collection Time: 01/17/15  9:20 PM  Result Value Ref Range   Glucose-Capillary 135 (H) 65 - 99 mg/dL  Glucose, capillary     Status: Abnormal   Collection Time: 01/18/15  6:51 AM  Result Value Ref Range   Glucose-Capillary 104 (H) 65 - 99 mg/dL     HEENT: poor dentition, and no masses palpated in the submandibular area and no masses palpated in the supraclavicular area or anterior cervical area. No tenderness to palpation along the lateral cervical area. Cardio: RRR and tachy Resp: CTA B/L and unlabored GI: BS positive and midline incision, good granulation tissue,, wet to dry dressingsRLQ colostomy Extremity:  Edema Right stump Skin:   Wound C/D/I Neuro: Alert/Oriented, Anxious and Abnormal Motor 4/5 bilateral deltoids, biceps, triceps, grip, 4/5 right hip flexor, 4/5 left hip flexor knee extensor  ankle dorsiflexor Musc/Skel: mild tenderness at the left costal margin anteriorly Gen. no acute distress   Assessment/Plan: 1. 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 Physiatrist is providing close team supervision and 24 hour management of active medical problems listed below. Physiatrist and rehab team continue to assess barriers to discharge/monitor patient progress toward functional and medical goals. Patient with short-lived neck pain as well as brief headache, do not think further workup is needed at this time, reassurance for patient. Follow-up at [facility] next week  Discussion with hematology recommending continued Lovenox, will attempt to contact [facility] to arrange for samples prior to discharge  FIM: FIM - Bathing Bathing Steps Patient Completed: Chest, Right Arm, Left Arm, Abdomen, Front perineal area, Buttocks, Right upper leg, Left upper leg, Left lower leg (including foot) Bathing: 5: Set-up assist to: Obtain items  FIM - Upper Body Dressing/Undressing Upper body dressing/undressing steps patient completed: Thread/unthread right sleeve of pullover shirt/dresss, Thread/unthread left sleeve of pullover shirt/dress, Put head through opening of pull over shirt/dress, Pull shirt over trunk Upper body dressing/undressing: 5: Set-up assist to: Obtain clothing/put away FIM - Lower Body Dressing/Undressing Lower body dressing/undressing steps patient completed: Thread/unthread right pants leg, Thread/unthread left pants leg, Pull pants up/down, Don/Doff left sock Lower body dressing/undressing: 5: Set-up assist to: Obtain clothing  FIM - Toileting Toileting steps completed by patient: Performs perineal hygiene Toileting: 2: Max-Patient completed 1 of 3 steps  FIM - Sport and exercise psychologist Devices: Bedside commode, Sliding board Toilet Transfers: 5-To toilet/BSC: Supervision (verbal cues/safety issues), 5-From toilet/BSC: Supervision (verbal cues/safety issues)  FIM - Control and instrumentation engineer Devices: Sliding board Bed/Chair Transfer: 5: Bed > Chair or W/C: Supervision (verbal cues/safety issues)  FIM - Locomotion: Wheelchair Distance: 200 Locomotion: Wheelchair: 6: Travels 150 ft or more, turns around, maneuvers to table, bed or toilet, negotiates 3% grade: maneuvers on rugs and over door sills independently FIM - Locomotion: Ambulation Ambulation/Gait Assistance: Not tested (comment) (not safe at this time) Locomotion: Ambulation: 0: Activity did not occur  Comprehension Comprehension Mode: Auditory Comprehension: 6-Follows complex conversation/direction: With extra time/assistive device  Expression Expression Mode: Verbal Expression: 6-Expresses complex ideas: With extra time/assistive device  Social Interaction Social Interaction: 6-Interacts appropriately with others with medication or extra time (anti-anxiety, antidepressant).  Problem Solving Problem Solving: 5-Solves complex 90% of the time/cues < 10% of the time  Memory Memory: 5-Recognizes or recalls 90% of the  time/requires cueing < 10% of the time  Medical Problem List and Plan: 1. 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  DVT Prophylaxis/Anticoagulation: Subcutaneous Lovenox. Monitor platelet counts and any signs of bleeding,discussed with hematology.  Hgb stable Dr Beryle Beams who recommends full dose anticoag with lovenox without conversion to warfarin given active metastatic CA, for at least 6 mo, given wt loss (?BKA) will reduce dose to 120mg  q24h                3. Pain  Management: Neurontin 100 mg 3 times a day, Oxycodone. Monitor with increased mobility,4. Mood/depression: Prozac 20 mg daily. 5. Neuropsych: This patient is capable of making decisions on her own behalf. 6. Skin/Wound Care: Local care to right BKA site as well as abdominal wound/ostomy and deep tissue injury to buttocks and sacrum.  WOC RN following for wound care. Has had some issues with seal of ostomy 7. Fluids/Electrolytes/Nutrition: Routine  I/Os  with follow-up chemistries, prob fluid overload will gently diurese 8. Acute blood loss anemia. Follow-up CBC stablelikely has some anemia of chronic disease as well 9. Diabetes mellitus with peripheral neuropathy. Hemoglobin A1c 6.3. Sliding scale insulin. Check blood sugars before meals and at bedtime 10. Hypertension. Norvasc 5 mg daily. 11.  Hypoxia improved,monitor her for desaturation in therapy 12. Abdominal oblique muscle strainEmma continue counter irritant cream, continue heat as well as muscle relaxers. Expect improvement over the next several days LOS (Days) [place] 01/18/2015, 8:18 AM

## 2015-01-19 ENCOUNTER — Inpatient Hospital Stay (HOSPITAL_COMMUNITY): Payer: Medicare Other | Admitting: Occupational Therapy

## 2015-01-19 LAB — GLUCOSE, CAPILLARY
GLUCOSE-CAPILLARY: 107 mg/dL — AB (ref 65–99)
Glucose-Capillary: 102 mg/dL — ABNORMAL HIGH (ref 65–99)

## 2015-01-19 LAB — CBC
HCT: 28.8 % — ABNORMAL LOW (ref 36.0–46.0)
Hemoglobin: 8.6 g/dL — ABNORMAL LOW (ref 12.0–15.0)
MCH: 25.6 pg — ABNORMAL LOW (ref 26.0–34.0)
MCHC: 29.9 g/dL — AB (ref 30.0–36.0)
MCV: 85.7 fL (ref 78.0–100.0)
PLATELETS: 610 10*3/uL — AB (ref 150–400)
RBC: 3.36 MIL/uL — ABNORMAL LOW (ref 3.87–5.11)
RDW: 18.5 % — AB (ref 11.5–15.5)
WBC: 8.5 10*3/uL (ref 4.0–10.5)

## 2015-01-19 MED ORDER — ENOXAPARIN SODIUM 120 MG/0.8ML ~~LOC~~ SOLN: 120.0000 mg | SUBCUTANEOUS | Status: DC

## 2015-01-19 MED ORDER — METHOCARBAMOL 500 MG PO TABS: 500.0000 mg | ORAL_TABLET | Freq: Four times a day (QID) | ORAL | Status: AC | PRN

## 2015-01-19 MED ORDER — PANTOPRAZOLE SODIUM 40 MG PO TBEC: 40.0000 mg | DELAYED_RELEASE_TABLET | Freq: Every day | ORAL | Status: AC

## 2015-01-19 MED ORDER — FLUOXETINE HCL 20 MG PO CAPS: 20.0000 mg | ORAL_CAPSULE | Freq: Every day | ORAL | Status: AC

## 2015-01-19 MED ORDER — ENOXAPARIN SODIUM 30 MG/0.3ML ~~LOC~~ SOLN: 90.0000 mg | SUBCUTANEOUS | Status: DC

## 2015-01-19 MED ORDER — GABAPENTIN 100 MG PO CAPS: 100.0000 mg | ORAL_CAPSULE | Freq: Three times a day (TID) | ORAL | Status: AC

## 2015-01-19 MED ORDER — AMLODIPINE BESYLATE 5 MG PO TABS: 5.0000 mg | ORAL_TABLET | Freq: Every day | ORAL | Status: DC

## 2015-01-19 MED ORDER — OXYCODONE-ACETAMINOPHEN 5-325 MG PO TABS: 1.0000 | ORAL_TABLET | ORAL | Status: DC | PRN

## 2015-01-19 MED ORDER — ENOXAPARIN SODIUM 100 MG/ML ~~LOC~~ SOLN
90.0000 mg | SUBCUTANEOUS | Status: DC
  Administered 2015-01-19: 90 mg via SUBCUTANEOUS
  Filled 2015-01-19: qty 1

## 2015-01-19 NOTE — Progress Notes (Signed)
Patient discharged home.  Left floor via wheelchair, escorted by nursing staff and family.  Patient and family verbalized understanding of discharge instructions as given by Marlowe Sh[place], PA.  All patients belongings including DME sent with patient.  Patient appears to be in no immediate distress at this time.  Brita Romp, RN

## 2015-01-19 NOTE — Progress Notes (Addendum)
ANTICOAGULATION CONSULT NOTE - Follow Up Consult  Pharmacy Consult for lovenox Indication: atrial fibrillation  No Known Allergies  Patient Measurements: Height: 5\' 11"  (180.3 cm) Weight: 124 lb 9.6 oz (56.518 kg) IBW/kg (Calculated) : 70.8  Vital Signs: Temp: 97.6 F (36.4 C) (06/03 0607) Temp Source: Oral (06/03 0607) BP: 139/61 mmHg (06/03 0607) Pulse Rate: 83 (06/03 0607)  Labs:  Recent Labs  01/19/15 0642  HGB 8.6*  HCT 28.8*  PLT 610*    Estimated Creatinine Clearance: 55.9 mL/min (by C-G formula based on Cr of 0.67).   Medications:  Scheduled:  . amLODipine  5 mg Oral Daily  . docusate sodium  100 mg Oral Daily  . enoxaparin (LOVENOX) injection  120 mg Subcutaneous Q24H  . feeding supplement (ENSURE ENLIVE)  237 mL Oral BID BM  . FLUoxetine  20 mg Oral Daily  . gabapentin  100 mg Oral TID  . insulin aspart  0-9 Units Subcutaneous TID WC & HS  . pantoprazole  40 mg Oral Daily   Infusions:    Assessment: 74 yo female with hx of afib and malignancy is currently on treatment dose of lovenox.  Hgb 8.6 low stable, Plt 610 K. Scr 0.67, est. crcl ~ 60-70 ml/min. No bleeding noted per chart. Current documented weight = 56.5kg on 6/1, but multiple previous weight were 80-82kg done after BKA. RN weighted pt again on a standing scale, and current weight is 57 kg.    Goal of Therapy:  Anti-Xa level 0.6-1 units/ml 4hrs after LMWH dose given Monitor platelets by anticoagulation protocol: Yes   Plan:  - Change lovenox to 90 mg sq Q 24 hrs - Text paged PA to change discharge order. - CBC every 72 hours while in the hospital.  Maryanna Shape, PharmD, BCPS  Clinical Pharmacist  Pager: [phone]   01/19/2015,11:28 AM

## 2015-01-19 NOTE — Progress Notes (Signed)
Occupational Therapy Session Note  Patient Details  Name: Kathryn Knapp MRN: [id number] Date of Birth: 13-Jul-1941  Today's Date: 01/19/2015 OT Individual Time: 0930-1010 OT Individual Time Calculation (min): 40 min    Short Term Goals: Week 3:  OT Short Term Goal 1 (Week 3): STG = LTGs due to remaining LOS  Skilled Therapeutic Interventions/Progress Updates:    Completed family education with focus on self-care tasks and residual limb wrapping.  Pt completed self-care tasks with setup assist with granddaughter present and providing supervision.  Pt's daughter present and reporting no concerns, reporting transfer into [facility] went well and no further concerns there.  Followed up with [facility] with need for appropriate drop arm BSC as it had not yet been delivered.  Pt reports ready for d/c home and no further questions.  Therapy Documentation Precautions:  Precautions Precautions: Fall Precaution Comments: NWB R LE Restrictions Weight Bearing Restrictions: Yes RLE Weight Bearing: Non weight bearing Pain: Pain Assessment Pain Score: 5   See FIM for current functional status  Therapy/Group: Individual Therapy  Simonne Come 01/19/2015, 10:16 AM

## 2015-01-19 NOTE — Progress Notes (Signed)
Social Work Discharge Note Discharge Note  The overall goal for the admission was met for:   Discharge location: [place]  Length of Stay: Yes-18 DAYS  Discharge activity level: Yes-SUPERVISION/MIN WHEELCHAIR LEVEL  Home/community participation: Yes  Services provided included: MD, RD, PT, OT, SLP, RN, CM, TR, Pharmacy, Neuropsych and SW  Financial Services: Medicare and Other: PENDING MEDICAID  Follow-up services arranged: Home Health: [facility]-PT,OT,RN,SW, DME: [facility]-WHEELCHAIR,WIDE DROP-ARM BEDSIDED COMMODE, ROLLING WALKER, [address] and Patient/Family has no preference for HH/DME agencies  Comments (or additional information):DAUGHTER HERE YESTERDAY TO LEARN PT'S CARE FEELS COMFORTABLE WITH AND ABLE TO RETURN DEMONSTRATE. PENDING MEDICAID, WILL BE MEETING WITH [facility] 6/9. DAUGHTER TO SET HER UP WITH [facility]-PCP. HAS RESOURCES FOR MEDICATION ASSISTANCE LOVENOX PT ASSISTANCE FAXED IN AWAITING APPROVAL.  Patient/Family verbalized understanding of follow-up arrangements: Yes  Individual responsible for coordination of the follow-up plan: SELF & DAUGHTER'S  Confirmed correct DME delivered: [name] 01/19/2015    [name]

## 2015-01-19 NOTE — Progress Notes (Addendum)
Removed staples from right stump per MD order.  Scant bleeding noted from staple site on right side of incision.  Placed steri strips and provided education to patient.  Patient re-wrapped stump with the help of the RN.  Midline incision dressing changed.  Provided additional education to patient and family on dressing changes. Patient and family verbalized their comfort in performing such tasks.  Brita Romp, RN

## 2015-01-19 NOTE — Progress Notes (Signed)
Patient and family educated on lovenox injections.  The patient and family were both shown how to waste the appropriate amount of medicine out of syringe prior to injecting.  The patient also verbalized proper site selection and demonstrated proper technique in self injecting.  Patient states she feels comfortable injecting self, and says if she has difficulty doing them herself, that she will seek help and not miss doses.  Brita Romp, RN

## 2015-01-19 NOTE — Progress Notes (Signed)
Chief complaint: Headache as well as left-sided neck pain Subjective/Complaints: 74 year old female with metastatic colon carcinoma who is looking forward to discharge today. She is wondering about the staples in her right lower extremity from her BKA which was from a acute embolism. This was over 3 weeks ago and has been healing well without drainage. In addition patient has been changing her own colostomy bag. Her neck pain is improved. Still has mid line abdominal discomfort along the incision line. Has learned how to do wet to dry dressings ROS  Negative except for midline abdominal pain,which is improving   Objective: Vital Signs: Blood pressure 139/61, pulse 83, temperature 97.6 F (36.4 C), temperature source Oral, resp. rate 18, height 5\' 11"  (1.803 m), weight 56.518 kg (124 lb 9.6 oz), SpO2 98 %. No results found. Results for orders placed or performed during the hospital encounter of 01/01/15 (from the past 72 hour(s))  Glucose, capillary     Status: Abnormal   Collection Time: 01/16/15 12:27 PM  Result Value Ref Range   Glucose-Capillary 110 (H) 65 - 99 mg/dL  Glucose, capillary     Status: None   Collection Time: 01/16/15  4:25 PM  Result Value Ref Range   Glucose-Capillary 94 65 - 99 mg/dL  Glucose, capillary     Status: None   Collection Time: 01/16/15  9:20 PM  Result Value Ref Range   Glucose-Capillary 93 65 - 99 mg/dL   Comment 1 Notify RN   Glucose, capillary     Status: None   Collection Time: 01/17/15  6:52 AM  Result Value Ref Range   Glucose-Capillary 95 65 - 99 mg/dL  Glucose, capillary     Status: Abnormal   Collection Time: 01/17/15 11:06 AM  Result Value Ref Range   Glucose-Capillary 117 (H) 65 - 99 mg/dL  Glucose, capillary     Status: Abnormal   Collection Time: 01/17/15  4:41 PM  Result Value Ref Range   Glucose-Capillary 128 (H) 65 - 99 mg/dL  Glucose, capillary     Status: Abnormal   Collection Time: 01/17/15  9:20 PM  Result Value Ref Range    Glucose-Capillary 135 (H) 65 - 99 mg/dL  Glucose, capillary     Status: Abnormal   Collection Time: 01/18/15  6:51 AM  Result Value Ref Range   Glucose-Capillary 104 (H) 65 - 99 mg/dL  Glucose, capillary     Status: Abnormal   Collection Time: 01/18/15 12:04 PM  Result Value Ref Range   Glucose-Capillary 133 (H) 65 - 99 mg/dL  Glucose, capillary     Status: Abnormal   Collection Time: 01/18/15  4:07 PM  Result Value Ref Range   Glucose-Capillary 119 (H) 65 - 99 mg/dL  Glucose, capillary     Status: Abnormal   Collection Time: 01/18/15  8:43 PM  Result Value Ref Range   Glucose-Capillary 102 (H) 65 - 99 mg/dL   Comment 1 Notify RN   CBC     Status: Abnormal   Collection Time: 01/19/15  6:42 AM  Result Value Ref Range   WBC 8.5 4.0 - 10.5 K/uL   RBC 3.36 (L) 3.87 - 5.11 MIL/uL   Hemoglobin 8.6 (L) 12.0 - 15.0 g/dL   HCT 28.8 (L) 36.0 - 46.0 %   MCV 85.7 78.0 - 100.0 fL   MCH 25.6 (L) 26.0 - 34.0 pg   MCHC 29.9 (L) 30.0 - 36.0 g/dL   RDW 18.5 (H) 11.5 - 15.5 %  Platelets 610 (H) 150 - 400 K/uL  Glucose, capillary     Status: Abnormal   Collection Time: 01/19/15  6:46 AM  Result Value Ref Range   Glucose-Capillary 107 (H) 65 - 99 mg/dL   Comment 1 Notify RN      HEENT: poor dentition, and no masses palpated in the submandibular area and no masses palpated in the supraclavicular area or anterior cervical area. No tenderness to palpation along the lateral cervical area. Cardio: RRR and tachy Resp: CTA B/L and unlabored GI: BS positive and midline incision, good granulation tissue,, wet to dry dressingsRLQ colostomy Extremity:  Edema Right stump Skin:   Good granulation tissue and smaller wound at the midline abdominal incision Ileostomy appliance is fitting well no leakage good output Right BKA well-healed has mild edema staples intact no drainage Neuro: Alert/Oriented, Anxious and Abnormal Motor 4/5 bilateral deltoids, biceps, triceps, grip, 4/5 right hip flexor, 4/5 left  hip flexor knee extensor ankle dorsiflexor Musc/Skel: mild tenderness at the left costal margin anteriorly Gen. no acute distress   Assessment/Plan: 1. Functional deficits secondary to right BKA Metastatic colon carcinoma status post open resection with healing by secondary intention of the abdominal wound as well as ileostomy.  follow up with general surgery, oncology as well as primary care.  follow up with vascular surgery in regards to her BKA Not ready for prosthetic yet, question overall prognosis this may influence prosthetic prescription  Stable for discharge today, see discharge summary  FIM: FIM - Bathing Bathing Steps Patient Completed: Chest, Right Arm, Left Arm, Abdomen, Front perineal area, Buttocks, Right upper leg, Left upper leg, Left lower leg (including foot) Bathing: 5: Set-up assist to: Obtain items  FIM - Upper Body Dressing/Undressing Upper body dressing/undressing steps patient completed: Thread/unthread right sleeve of pullover shirt/dresss, Thread/unthread left sleeve of pullover shirt/dress, Put head through opening of pull over shirt/dress, Pull shirt over trunk Upper body dressing/undressing: 6: More than reasonable amount of time FIM - Lower Body Dressing/Undressing Lower body dressing/undressing steps patient completed: Thread/unthread right pants leg, Thread/unthread left pants leg, Pull pants up/down, Don/Doff left sock Lower body dressing/undressing: 5: Set-up assist to: Don/Doff TED stocking  FIM - Toileting Toileting steps completed by patient: Adjust clothing prior to toileting, Performs perineal hygiene, Adjust clothing after toileting Toileting: 4: Steadying assist  FIM - Radio producer Devices: Bedside commode, Sliding board Toilet Transfers: 5-To toilet/BSC: Supervision (verbal cues/safety issues), 5-From toilet/BSC: Supervision (verbal cues/safety issues)  FIM - Control and instrumentation engineer  Devices: Sliding board Bed/Chair Transfer: 6: Supine > Sit: No assist, 6: Sit > Supine: No assist, 4: Bed > Chair or W/C: Min A (steadying Pt. > 75%), 4: Chair or W/C > Bed: Min A (steadying Pt. > 75%)  FIM - Locomotion: Wheelchair Distance: 250 Locomotion: Wheelchair: 6: Travels 150 ft or more, turns around, maneuvers to table, bed or toilet, negotiates 3% grade: maneuvers on rugs and over door sills independently FIM - Locomotion: Ambulation Ambulation/Gait Assistance: Not tested (comment) (not safe at this time) Locomotion: Ambulation: 1: Travels less than 50 ft with total assistance/helper does all (Pt.<25%)  Comprehension Comprehension Mode: Auditory Comprehension: 6-Follows complex conversation/direction: With extra time/assistive device  Expression Expression Mode: Verbal Expression: 6-Expresses complex ideas: With extra time/assistive device  Social Interaction Social Interaction: 6-Interacts appropriately with others with medication or extra time (anti-anxiety, antidepressant).  Problem Solving Problem Solving: 6-Solves complex problems: With extra time  Memory Memory: 6-More than reasonable amt of time  Medical  Problem List and Plan: 1. 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  DVT Prophylaxis/Anticoagulation: Subcutaneous Lovenox. Monitor platelet counts and any signs of bleeding,discussed with hematology.  Hgb stable Dr Beryle Beams who recommends full dose anticoag with lovenox without conversion to warfarin given active metastatic CA, for at least 6 mo, given wt loss (?BKA) will reduce dose to 120mg  q24h                3. Pain Management: Neurontin 100 mg 3 times a day, Oxycodone. Monitor with increased mobility,4. Mood/depression: Prozac 20 mg daily. 5. Neuropsych: This patient is capable of  making decisions on her own behalf. 6. Skin/Wound Care:WOC RN following for wound care. Has learned all her dressing changes 7. Fluids/Electrolytes/Nutrition: Routine  I/Os  with follow-up chemistries, prob fluid overload will gently diurese 8. Acute blood loss anemia. Follow-up CBC stablelikely has some anemia of chronic disease as well 9. Diabetes mellitus with peripheral neuropathy. Hemoglobin A1c 6.3. Sliding scale insulin. Check blood sugars before meals and at bedtime 10. Hypertension. Norvasc 5 mg daily. 11.  Hypoxia improved,monitor her for desaturation in therapy  LOS (Days) 18 A FACE TO FACE EVALUATION WAS PERFORMED  KIRSTEINS,ANDREW E 01/19/2015, 10:15 AM

## 2015-01-19 NOTE — Progress Notes (Signed)
Physical Therapy Discharge Summary  Patient Details  Name: Kathryn Knapp MRN: [id number] Date of Birth: [birth year]     Patient has met 5 of 6 long term goals due to improved activity tolerance, improved balance, improved postural control, increased strength, increased range of motion, decreased pain, ability to compensate for deficits, improved attention and improved awareness.  Patient to discharge at a wheelchair level [place].   Patient's care partner is independent to provide the necessary physical assistance at discharge.  Reasons goals not met: Increased anxiety during standing balance requiring assistance for support.   Recommendation:  Patient will benefit from ongoing skilled PT services in home health setting to continue to advance safe functional mobility, address ongoing impairments in strength, endurance and reduce burden of care, and minimize fall risk.  Equipment: [employer], wheelchair, and [place] 2 deep contour cushion, resdiual limb support   Reasons for discharge: treatment goals met  Patient/family agrees with progress made and goals achieved: Yes  PT Discharge Pain Pain Assessment Pain Assessment: 0-10 Pain Score: 5  Pain Type: Acute pain Pain Location: Leg Pain Orientation: Right Pain Descriptors / Indicators: Aching Pain Frequency: Intermittent Pain Onset: With Activity Patients Stated Pain Goal: 3 Pain Intervention(s): Medication (See eMAR)   Motor  Motor Motor: Within Functional Limits    Balance Balance Balance Assessed: Yes Static Sitting Balance Static Sitting - Balance Support: Feet supported Static Sitting - Level of Assistance: 7: Independent Dynamic Sitting Balance Dynamic Sitting - Balance Support: Feet supported Dynamic Sitting - Level of Assistance: 7: Independent Dynamic Sitting - Balance Activities: Lateral lean/weight shifting;Trunk control activities Static Standing Balance Static Standing - Balance Support: Bilateral upper  extremity supported Static Standing - Level of Assistance: 4: Min assist Static Standing - Comment/# of Minutes: 2 minutes Extremity Assessment WFL except right transtibial amputation.         See FIM for current functional status  [name] 01/19/2015, 2:18 PM

## 2015-01-22 ENCOUNTER — Telehealth: Payer: Self-pay | Admitting: *Deleted

## 2015-01-22 NOTE — Telephone Encounter (Signed)
Estill Bamberg Start of Care RN for [facility] called to inform Dr Letta Pate that the patient was not admitted to home care on Saturday due to unsafe home environment. She was instructed to follow up with the hospital if she has further needs.

## 2015-01-22 NOTE — Telephone Encounter (Signed)
What does unsafe home environment mean

## 2015-01-23 ENCOUNTER — Telehealth: Payer: Self-pay | Admitting: *Deleted

## 2015-01-23 NOTE — Telephone Encounter (Addendum)
I called and spoke with Estill Bamberg to find out more about what the unsafe environment was about.  Apparently the living situation is dire.  Trailer, dilapidated filled with garbage and bugs and little or no room to move about. Daughter does not appear capable of caring for the patient in this environment. I am not sure what steps should be taken as a MSW cannot go out without a skilled service in the home.  Apparently there was call taken by [facility] from the daughter stating patient is in great pain. MB put her on the schedule for Monday.

## 2015-01-23 NOTE — Telephone Encounter (Signed)
Called Kathryn Knapp and left message that Dr. Raliegh Ip would like her to clarify why the home is not safe.

## 2015-01-23 NOTE — Telephone Encounter (Signed)
Patients daughter called because patient is in a lot of pain and she needs a refill on her pain meds from the hospital.  Moved up her appointment to Monday 01/29/15 with AK at 10:15.  Patient instructed to be here atleast 30 minutes early to complete paperwork

## 2015-01-24 ENCOUNTER — Ambulatory Visit (HOSPITAL_BASED_OUTPATIENT_CLINIC_OR_DEPARTMENT_OTHER): Payer: Self-pay | Admitting: Hematology

## 2015-01-24 ENCOUNTER — Encounter: Payer: Self-pay | Admitting: Hematology

## 2015-01-24 ENCOUNTER — Telehealth: Payer: Self-pay | Admitting: *Deleted

## 2015-01-24 ENCOUNTER — Ambulatory Visit: Payer: Medicare Other

## 2015-01-24 ENCOUNTER — Emergency Department (HOSPITAL_COMMUNITY): Payer: Medicare Other

## 2015-01-24 ENCOUNTER — Other Ambulatory Visit: Payer: Self-pay

## 2015-01-24 ENCOUNTER — Encounter: Payer: Self-pay | Admitting: *Deleted

## 2015-01-24 ENCOUNTER — Telehealth: Payer: Self-pay

## 2015-01-24 ENCOUNTER — Encounter (HOSPITAL_COMMUNITY): Payer: Self-pay | Admitting: Internal Medicine

## 2015-01-24 ENCOUNTER — Encounter (HOSPITAL_COMMUNITY): Payer: Self-pay | Admitting: Emergency Medicine

## 2015-01-24 ENCOUNTER — Inpatient Hospital Stay (HOSPITAL_COMMUNITY)
Admission: EM | Admit: 2015-01-24 | Discharge: 2015-01-27 | DRG: 871 | Disposition: A | Discharge status: Home Health | Payer: Medicare Other | Attending: Family Medicine | Admitting: Family Medicine

## 2015-01-24 VITALS — BP 124/72 | HR 107 | Temp 97.4°F | Resp 20 | Ht 61.0 in

## 2015-01-24 DIAGNOSIS — C786 Secondary malignant neoplasm of retroperitoneum and peritoneum: Secondary | ICD-10-CM

## 2015-01-24 DIAGNOSIS — Z8249 Family history of ischemic heart disease and other diseases of the circulatory system: Secondary | ICD-10-CM | POA: Diagnosis not present

## 2015-01-24 DIAGNOSIS — R109 Unspecified abdominal pain: Secondary | ICD-10-CM | POA: Diagnosis present

## 2015-01-24 DIAGNOSIS — K210 Gastro-esophageal reflux disease with esophagitis: Secondary | ICD-10-CM

## 2015-01-24 DIAGNOSIS — Z515 Encounter for palliative care: Secondary | ICD-10-CM

## 2015-01-24 DIAGNOSIS — Z79899 Other long term (current) drug therapy: Secondary | ICD-10-CM | POA: Diagnosis not present

## 2015-01-24 DIAGNOSIS — F32A Depression, unspecified: Secondary | ICD-10-CM

## 2015-01-24 DIAGNOSIS — K219 Gastro-esophageal reflux disease without esophagitis: Secondary | ICD-10-CM | POA: Diagnosis present

## 2015-01-24 DIAGNOSIS — R630 Anorexia: Secondary | ICD-10-CM

## 2015-01-24 DIAGNOSIS — A419 Sepsis, unspecified organism: Secondary | ICD-10-CM | POA: Diagnosis present

## 2015-01-24 DIAGNOSIS — Z1509 Genetic susceptibility to other malignant neoplasm: Secondary | ICD-10-CM

## 2015-01-24 DIAGNOSIS — N390 Urinary tract infection, site not specified: Secondary | ICD-10-CM | POA: Diagnosis present

## 2015-01-24 DIAGNOSIS — R05 Cough: Secondary | ICD-10-CM

## 2015-01-24 DIAGNOSIS — C7972 Secondary malignant neoplasm of left adrenal gland: Secondary | ICD-10-CM | POA: Diagnosis present

## 2015-01-24 DIAGNOSIS — Z9049 Acquired absence of other specified parts of digestive tract: Secondary | ICD-10-CM | POA: Diagnosis present

## 2015-01-24 DIAGNOSIS — Z903 Acquired absence of stomach [part of]: Secondary | ICD-10-CM | POA: Diagnosis present

## 2015-01-24 DIAGNOSIS — S88111A Complete traumatic amputation at level between knee and ankle, right lower leg, initial encounter: Secondary | ICD-10-CM

## 2015-01-24 DIAGNOSIS — C189 Malignant neoplasm of colon, unspecified: Secondary | ICD-10-CM | POA: Diagnosis present

## 2015-01-24 DIAGNOSIS — E43 Unspecified severe protein-calorie malnutrition: Secondary | ICD-10-CM | POA: Insufficient documentation

## 2015-01-24 DIAGNOSIS — Z89511 Acquired absence of right leg below knee: Secondary | ICD-10-CM | POA: Diagnosis not present

## 2015-01-24 DIAGNOSIS — I8290 Acute embolism and thrombosis of unspecified vein: Secondary | ICD-10-CM | POA: Diagnosis present

## 2015-01-24 DIAGNOSIS — I10 Essential (primary) hypertension: Secondary | ICD-10-CM | POA: Diagnosis present

## 2015-01-24 DIAGNOSIS — R634 Abnormal weight loss: Secondary | ICD-10-CM

## 2015-01-24 DIAGNOSIS — J910 Malignant pleural effusion: Secondary | ICD-10-CM | POA: Diagnosis present

## 2015-01-24 DIAGNOSIS — R1032 Left lower quadrant pain: Secondary | ICD-10-CM

## 2015-01-24 DIAGNOSIS — Z681 Body mass index (BMI) 19 or less, adult: Secondary | ICD-10-CM

## 2015-01-24 DIAGNOSIS — F329 Major depressive disorder, single episode, unspecified: Secondary | ICD-10-CM | POA: Diagnosis present

## 2015-01-24 DIAGNOSIS — Z7901 Long term (current) use of anticoagulants: Secondary | ICD-10-CM | POA: Diagnosis not present

## 2015-01-24 DIAGNOSIS — R059 Cough, unspecified: Secondary | ICD-10-CM | POA: Diagnosis present

## 2015-01-24 DIAGNOSIS — E119 Type 2 diabetes mellitus without complications: Secondary | ICD-10-CM | POA: Diagnosis present

## 2015-01-24 DIAGNOSIS — Z79891 Long term (current) use of opiate analgesic: Secondary | ICD-10-CM

## 2015-01-24 DIAGNOSIS — C185 Malignant neoplasm of splenic flexure: Secondary | ICD-10-CM

## 2015-01-24 DIAGNOSIS — M25512 Pain in left shoulder: Secondary | ICD-10-CM

## 2015-01-24 HISTORY — DX: Major depressive disorder, single episode, unspecified: F32.9

## 2015-01-24 HISTORY — DX: Malignant neoplasm of colon, unspecified: C18.9

## 2015-01-24 HISTORY — DX: Depression, unspecified: F32.A

## 2015-01-24 HISTORY — DX: Gastro-esophageal reflux disease without esophagitis: K21.9

## 2015-01-24 LAB — URINALYSIS, ROUTINE W REFLEX MICROSCOPIC
Glucose, UA: NEGATIVE mg/dL
Ketones, ur: NEGATIVE mg/dL
Nitrite: NEGATIVE
Protein, ur: 30 mg/dL — AB
Specific Gravity, Urine: 1.027 (ref 1.005–1.030)
Urobilinogen, UA: 0.2 mg/dL (ref 0.0–1.0)
pH: 5.5 (ref 5.0–8.0)

## 2015-01-24 LAB — COMPREHENSIVE METABOLIC PANEL
ALT: 16 U/L (ref 14–54)
AST: 24 U/L (ref 15–41)
Albumin: 2.8 g/dL — ABNORMAL LOW (ref 3.5–5.0)
Alkaline Phosphatase: 175 U/L — ABNORMAL HIGH (ref 38–126)
Anion gap: 11 (ref 5–15)
BUN: 19 mg/dL (ref 6–20)
CO2: 26 mmol/L (ref 22–32)
Calcium: 9.3 mg/dL (ref 8.9–10.3)
Chloride: 101 mmol/L (ref 101–111)
Creatinine, Ser: 0.73 mg/dL (ref 0.44–1.00)
GFR calc Af Amer: >60 mL/min (ref >60)
GFR calc non Af Amer: >60 mL/min (ref >60)
Glucose, Bld: 110 mg/dL — ABNORMAL HIGH (ref 65–99)
Potassium: 4 mmol/L (ref 3.5–5.1)
Sodium: 138 mmol/L (ref 135–145)
Total Bilirubin: 0.5 mg/dL (ref 0.3–1.2)
Total Protein: 7.6 g/dL (ref 6.5–8.1)

## 2015-01-24 LAB — CBC WITH DIFFERENTIAL/PLATELET
Basophils Absolute: 0.1 10*3/uL (ref 0.0–0.1)
Basophils Relative: 1 % (ref 0–1)
Eosinophils Absolute: 0.1 10*3/uL (ref 0.0–0.7)
Eosinophils Relative: 1 % (ref 0–5)
HCT: 32.3 % — ABNORMAL LOW (ref 36.0–46.0)
Hemoglobin: 9.8 g/dL — ABNORMAL LOW (ref 12.0–15.0)
Lymphocytes Relative: 22 % (ref 12–46)
Lymphs Abs: 2.4 10*3/uL (ref 0.7–4.0)
MCH: 26.6 pg (ref 26.0–34.0)
MCHC: 30.3 g/dL (ref 30.0–36.0)
MCV: 87.5 fL (ref 78.0–100.0)
Monocytes Absolute: 0.8 10*3/uL (ref 0.1–1.0)
Monocytes Relative: 7 % (ref 3–12)
Neutro Abs: 7.7 10*3/uL (ref 1.7–7.7)
Neutrophils Relative %: 69 % (ref 43–77)
Platelets: 537 10*3/uL — ABNORMAL HIGH (ref 150–400)
RBC: 3.69 MIL/uL — ABNORMAL LOW (ref 3.87–5.11)
RDW: 19.3 % — ABNORMAL HIGH (ref 11.5–15.5)
WBC: 11 10*3/uL — ABNORMAL HIGH (ref 4.0–10.5)

## 2015-01-24 LAB — PROTIME-INR
INR: 1.15 (ref 0.00–1.49)
Prothrombin Time: 14.9 seconds (ref 11.6–15.2)

## 2015-01-24 LAB — URINE MICROSCOPIC-ADD ON

## 2015-01-24 LAB — LACTIC ACID, PLASMA: LACTIC ACID, VENOUS: 1.3 mmol/L (ref 0.5–2.0)

## 2015-01-24 LAB — APTT: aPTT: 31 seconds (ref 24–37)

## 2015-01-24 MED ORDER — METHOCARBAMOL 500 MG PO TABS
500.0000 mg | ORAL_TABLET | Freq: Four times a day (QID) | ORAL | Status: DC | PRN
  Administered 2015-01-25 – 2015-01-26 (×3): 500 mg via ORAL
  Filled 2015-01-24 (×3): qty 1

## 2015-01-24 MED ORDER — SODIUM CHLORIDE 0.9 % IJ SOLN: 9.0000 mL | INTRAMUSCULAR | Status: DC | PRN

## 2015-01-24 MED ORDER — CETYLPYRIDINIUM CHLORIDE 0.05 % MT LIQD
7.0000 mL | Freq: Two times a day (BID) | OROMUCOSAL | Status: DC
  Administered 2015-01-25 – 2015-01-27 (×5): 7 mL via OROMUCOSAL

## 2015-01-24 MED ORDER — DIPHENHYDRAMINE HCL 12.5 MG/5ML PO ELIX: 12.5000 mg | ORAL_SOLUTION | Freq: Four times a day (QID) | ORAL | Status: DC | PRN

## 2015-01-24 MED ORDER — VANCOMYCIN HCL 500 MG IV SOLR
500.0000 mg | Freq: Two times a day (BID) | INTRAVENOUS | Status: DC
  Administered 2015-01-25 – 2015-01-26 (×3): 500 mg via INTRAVENOUS
  Filled 2015-01-24 (×3): qty 500

## 2015-01-24 MED ORDER — FLUOXETINE HCL 20 MG PO CAPS
20.0000 mg | ORAL_CAPSULE | Freq: Every day | ORAL | Status: DC
  Administered 2015-01-25 – 2015-01-27 (×3): 20 mg via ORAL
  Filled 2015-01-24 (×3): qty 1

## 2015-01-24 MED ORDER — DEXTROSE 5 % IV SOLN: 1.0000 g | Freq: Two times a day (BID) | INTRAVENOUS | Status: DC

## 2015-01-24 MED ORDER — MORPHINE SULFATE 15 MG PO TABS
15.0000 mg | ORAL_TABLET | Freq: Once | ORAL | Status: AC
  Administered 2015-01-24: 15 mg via ORAL

## 2015-01-24 MED ORDER — SODIUM CHLORIDE 0.9 % IV BOLUS (SEPSIS)
1000.0000 mL | Freq: Once | INTRAVENOUS | Status: AC
  Administered 2015-01-24: 1000 mL via INTRAVENOUS

## 2015-01-24 MED ORDER — MORPHINE SULFATE 15 MG PO TABS
ORAL_TABLET | ORAL | Status: AC
  Filled 2015-01-24: qty 1

## 2015-01-24 MED ORDER — HYDROMORPHONE HCL 1 MG/ML IJ SOLN
1.0000 mg | Freq: Once | INTRAMUSCULAR | Status: AC
Start: 2015-01-24 — End: 2015-01-24
  Administered 2015-01-24: 1 mg via INTRAVENOUS
  Filled 2015-01-24: qty 1

## 2015-01-24 MED ORDER — INSULIN ASPART 100 UNIT/ML ~~LOC~~ SOLN
0.0000 [IU] | Freq: Three times a day (TID) | SUBCUTANEOUS | Status: DC
Start: 2015-01-25 — End: 2015-01-27

## 2015-01-24 MED ORDER — DEXTROSE 5 % IV SOLN
1.0000 g | Freq: Two times a day (BID) | INTRAVENOUS | Status: DC
  Filled 2015-01-24: qty 1

## 2015-01-24 MED ORDER — SODIUM CHLORIDE 0.9 % IV SOLN
INTRAVENOUS | Status: DC
  Administered 2015-01-24 – 2015-01-27 (×4): via INTRAVENOUS

## 2015-01-24 MED ORDER — PANTOPRAZOLE SODIUM 40 MG PO TBEC
40.0000 mg | DELAYED_RELEASE_TABLET | Freq: Every day | ORAL | Status: DC
  Administered 2015-01-25 – 2015-01-27 (×3): 40 mg via ORAL
  Filled 2015-01-24 (×4): qty 1

## 2015-01-24 MED ORDER — ENOXAPARIN SODIUM 100 MG/ML ~~LOC~~ SOLN
90.0000 mg | SUBCUTANEOUS | Status: DC
  Administered 2015-01-25 – 2015-01-27 (×3): 90 mg via SUBCUTANEOUS
  Filled 2015-01-24 (×3): qty 1

## 2015-01-24 MED ORDER — DOCUSATE SODIUM 100 MG PO CAPS
100.0000 mg | ORAL_CAPSULE | Freq: Every day | ORAL | Status: DC
  Administered 2015-01-25 – 2015-01-27 (×3): 100 mg via ORAL
  Filled 2015-01-24 (×3): qty 1

## 2015-01-24 MED ORDER — CHLORHEXIDINE GLUCONATE 0.12 % MT SOLN
15.0000 mL | Freq: Two times a day (BID) | OROMUCOSAL | Status: DC
  Administered 2015-01-25 – 2015-01-27 (×4): 15 mL via OROMUCOSAL
  Filled 2015-01-24 (×5): qty 15

## 2015-01-24 MED ORDER — IOHEXOL 300 MG/ML  SOLN
50.0000 mL | Freq: Once | INTRAMUSCULAR | Status: AC | PRN
  Administered 2015-01-24: 50 mL via ORAL

## 2015-01-24 MED ORDER — NALOXONE HCL 0.4 MG/ML IJ SOLN: 0.4000 mg | INTRAMUSCULAR | Status: DC | PRN

## 2015-01-24 MED ORDER — ALUM & MAG HYDROXIDE-SIMETH 200-200-20 MG/5ML PO SUSP
30.0000 mL | Freq: Four times a day (QID) | ORAL | Status: DC | PRN
  Filled 2015-01-24: qty 30

## 2015-01-24 MED ORDER — GABAPENTIN 100 MG PO CAPS
100.0000 mg | ORAL_CAPSULE | Freq: Three times a day (TID) | ORAL | Status: DC
  Administered 2015-01-24 – 2015-01-27 (×8): 100 mg via ORAL
  Filled 2015-01-24 (×9): qty 1

## 2015-01-24 MED ORDER — PIPERACILLIN-TAZOBACTAM 3.375 G IVPB
3.3750 g | Freq: Three times a day (TID) | INTRAVENOUS | Status: DC
  Administered 2015-01-25 – 2015-01-26 (×4): 3.375 g via INTRAVENOUS
  Filled 2015-01-24 (×5): qty 50

## 2015-01-24 MED ORDER — ENSURE ENLIVE PO LIQD
237.0000 mL | Freq: Two times a day (BID) | ORAL | Status: DC
  Administered 2015-01-25: 237 mL via ORAL

## 2015-01-24 MED ORDER — HYDROMORPHONE 0.3 MG/ML IV SOLN
INTRAVENOUS | Status: DC
  Administered 2015-01-24: 23:00:00 via INTRAVENOUS
  Administered 2015-01-25: 1.59 mg via INTRAVENOUS
  Administered 2015-01-25: 0.6 mg via INTRAVENOUS
  Administered 2015-01-25: 1.19 mg via INTRAVENOUS
  Administered 2015-01-25: 1.4 mg via INTRAVENOUS
  Filled 2015-01-24: qty 25

## 2015-01-24 MED ORDER — ENOXAPARIN SODIUM 100 MG/ML ~~LOC~~ SOLN
90.0000 mg | SUBCUTANEOUS | Status: DC
  Filled 2015-01-24: qty 1

## 2015-01-24 MED ORDER — IOHEXOL 300 MG/ML  SOLN
100.0000 mL | Freq: Once | INTRAMUSCULAR | Status: AC | PRN
  Administered 2015-01-24: 100 mL via INTRAVENOUS

## 2015-01-24 MED ORDER — IPRATROPIUM-ALBUTEROL 0.5-2.5 (3) MG/3ML IN SOLN: 3.0000 mL | Freq: Four times a day (QID) | RESPIRATORY_TRACT | Status: DC | PRN

## 2015-01-24 MED ORDER — ONDANSETRON HCL 4 MG/2ML IJ SOLN: 4.0000 mg | Freq: Four times a day (QID) | INTRAMUSCULAR | Status: DC | PRN

## 2015-01-24 MED ORDER — DM-GUAIFENESIN ER 30-600 MG PO TB12
1.0000 | ORAL_TABLET | Freq: Two times a day (BID) | ORAL | Status: DC
  Administered 2015-01-24 – 2015-01-27 (×6): 1 via ORAL
  Filled 2015-01-24 (×6): qty 1

## 2015-01-24 MED ORDER — VANCOMYCIN HCL IN DEXTROSE 1-5 GM/200ML-% IV SOLN
1000.0000 mg | Freq: Two times a day (BID) | INTRAVENOUS | Status: DC
  Filled 2015-01-24: qty 200

## 2015-01-24 MED ORDER — SODIUM CHLORIDE 0.9 % IV BOLUS (SEPSIS)
500.0000 mL | Freq: Once | INTRAVENOUS | Status: AC
  Administered 2015-01-24: 500 mL via INTRAVENOUS

## 2015-01-24 MED ORDER — SODIUM CHLORIDE 0.9 % IJ SOLN
3.0000 mL | Freq: Two times a day (BID) | INTRAMUSCULAR | Status: DC
  Administered 2015-01-27: 3 mL via INTRAVENOUS

## 2015-01-24 MED ORDER — DEXTROSE 5 % IV SOLN
1.0000 g | Freq: Three times a day (TID) | INTRAVENOUS | Status: DC
  Administered 2015-01-24: 1 g via INTRAVENOUS
  Filled 2015-01-24 (×2): qty 1

## 2015-01-24 MED ORDER — VANCOMYCIN HCL IN DEXTROSE 1-5 GM/200ML-% IV SOLN
1000.0000 mg | Freq: Once | INTRAVENOUS | Status: AC
  Administered 2015-01-24: 1000 mg via INTRAVENOUS
  Filled 2015-01-24: qty 200

## 2015-01-24 MED ORDER — DIPHENHYDRAMINE HCL 50 MG/ML IJ SOLN: 12.5000 mg | Freq: Four times a day (QID) | INTRAMUSCULAR | Status: DC | PRN

## 2015-01-24 MED ORDER — MORPHINE SULFATE 15 MG PO TABS: 15.0000 mg | ORAL_TABLET | ORAL | Status: DC | PRN

## 2015-01-24 MED ORDER — MORPHINE SULFATE 15 MG PO TABS
15.0000 mg | ORAL_TABLET | ORAL | Status: DC | PRN
  Administered 2015-01-25 – 2015-01-27 (×9): 15 mg via ORAL
  Filled 2015-01-24 (×9): qty 1

## 2015-01-24 MED ORDER — DIPHENHYDRAMINE HCL 50 MG/ML IJ SOLN
25.0000 mg | Freq: Four times a day (QID) | INTRAMUSCULAR | Status: DC | PRN
  Administered 2015-01-24: 25 mg via INTRAVENOUS
  Filled 2015-01-24: qty 1

## 2015-01-24 MED ORDER — HYDRALAZINE HCL 20 MG/ML IJ SOLN: 5.0000 mg | INTRAMUSCULAR | Status: DC | PRN

## 2015-01-24 MED ORDER — ACETAMINOPHEN 325 MG PO TABS: 325.0000 mg | ORAL_TABLET | ORAL | Status: DC | PRN

## 2015-01-24 MED ORDER — HYDROMORPHONE HCL 1 MG/ML IJ SOLN
1.0000 mg | Freq: Once | INTRAMUSCULAR | Status: AC
  Administered 2015-01-24: 1 mg via INTRAVENOUS
  Filled 2015-01-24: qty 1

## 2015-01-24 NOTE — ED Notes (Signed)
Pt sent from cancer center for Left side pain that starts in her head and runs down her left side of her body, with worst pain in her left side of her abd.  Pt states that pain was there when she was at [facility] but over the past 3 days the pain has gotten worse.  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

## 2015-01-24 NOTE — Telephone Encounter (Signed)
Okay, not sure what we can do for her. She sounds like she'll need a nursing home

## 2015-01-24 NOTE — [facility] Oncology Navigator Note (Signed)
Met with patient and family during hospital follow up visit. Explained the role of the GI Nurse Navigator. She is currently in significant pain and has been so for last 3 days, was trying to wait for the appointment today. 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 Has mid abdominal surgical  wound that is open and draining pale green liquid. Cleaned area with NS and packed with saline wet to dry dressing and ABD pad. Patient reports they have been out of dressing supplies since yesterday. She has not followed up with surgeon yet. Was discharged from rehab on 6/03 and all her DME is in home. [facility] nurse came out Monday for assessment and was told service will start on Monday, 6/13. She needs nursing, PT/OT in home asap. Family expresses difficulty paying for her medications and have not had opportunity to get her any Ensure or Boost/Glucerna yet. Left message for dietician regarding any supplement samples or coupons she may have. Determined that she can't get [place] assistance for medications since she is not under active treatment. Our office provided her with a box of Lovenox $RemoveBe'80mg'[id number]$  syringes for her daily use in home.  This RN will follow up with [facility] tomorrow regarding her home care and supplies needed. Merceda Elks, RN, BSN GI Oncology [place]

## 2015-01-24 NOTE — Progress Notes (Addendum)
ANTIBIOTIC CONSULT NOTE - INITIAL  Pharmacy Consult for vancomycin Indication: pneumonia  No Known Allergies  Patient Measurements: Height: 5\' 1"  (154.9 cm) Weight: 126 lb (57.153 kg) IBW/kg (Calculated) : 47.8   Vital Signs: Temp: 98.3 F (36.8 C) (06/08 1852) Temp Source: Oral (06/08 1852) BP: 121/45 mmHg (06/08 1954) Pulse Rate: 79 (06/08 1954) Intake/Output from previous day:   Intake/Output from this shift:    Labs:  Recent Labs  01/24/15 1814  WBC 11.0*  HGB 9.8*  PLT 537*  CREATININE 0.73   Estimated Creatinine Clearance: 47.3 mL/min (by C-G formula based on Cr of 0.73). No results for input(s): VANCOTROUGH, VANCOPEAK, VANCORANDOM, GENTTROUGH, GENTPEAK, GENTRANDOM, TOBRATROUGH, TOBRAPEAK, TOBRARND, AMIKACINPEAK, AMIKACINTROU, AMIKACIN in the last 72 hours.   Microbiology: No results found for this or any previous visit (from the past 720 hour(s)).  Medical History: Past Medical History  Diagnosis Date  . Diabetes mellitus without complication   . Hypertension     Assessment: Patient is a 74 y.o F with metatatic colon cancer and recent perforated colon mass with expl lap, right transverse and partial L colectomy with gastrectomy and ileostomy on 12/17/14. 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 She was referred to [facility] from her oncologist office today for further workup of worsening of left abdominal pain.  CXR showed "continued retrocardiac density along with moderate LEFT pleural effusion. Improved aeration on the RIGHT."  To start vancomycin and ceftazidime for suspected PNA for 8 days.  Goal of Therapy:  Vancomycin trough level 15-20 mcg/ml  Plan:  - vancomycin 1000mg  IV x1, then vancomycin 500 mg IV q12h - reduce ceftazidime 1gm IV q12h for renal function   Dia Sitter P 01/24/2015,9:03 PM   Adden: To change ceftazidime to zosyn.  Will give zosyn 3.375 gm IV q8h (infuse over 4 hours).   Of note, ceftazidime dose given at 2114-- will give first dose of zosyn at 0900 on 6/9.  Dia Sitter, PharmD, BCPS 01/24/2015 10:11 PM

## 2015-01-24 NOTE — Telephone Encounter (Signed)
Pt requested to be seen today, per CB/and Dr. Burr Medico Dr. Burr Medico to see pt today at 3pm, Spoke with lenise in [place] who states she can see pt at 245 today. Appts for 01/25/15 can be cancelled. Informed Lashonya in triage who will send POF and call pt.

## 2015-01-24 NOTE — Progress Notes (Signed)
Deep tissue injury with broken skin present on sacrum. Pt states that it was present when she was at [facility] and has been improving. Wound care nurse consult is in. Will appreciate if he/she evaluates site. Will continue to monitor and turn patient.

## 2015-01-24 NOTE — Telephone Encounter (Signed)
Patient states that she is having left sided pain. Patient is currently taking percocet with no relief. MD Feng/ NP Selena Lesser notified. Patient will see MD Burr Medico today. POF sent to scheduler.

## 2015-01-24 NOTE — ED Notes (Addendum)
ILLEOSTOMY PRESENT. NO COMPLAINTS

## 2015-01-24 NOTE — ED Notes (Signed)
I attempted to collect blood on patient and was unsuccessful.  I made the nurse aware.

## 2015-01-24 NOTE — H&P (Signed)
[facility] History and Physical  AMEL KITCH VZD:[id number] DOB: 10-Aug-1941 DOA: 01/24/2015  Referring physician: ED physician PCP: No PCP Per Patient  Specialists:   Chief Complaint: Worsening abdominal pain, mild cough, burning on urination  HPI: Kathryn Knapp is a 74 y.o. female with PMH of HTN, diet control DM, metastasized colon cancer, s/p of exploratory laparotomy on 12/17/14 by Dr. 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  Patient was sent to ED from cancer center for gradually worsening Left abdominal pain that started today. Her abdominal pain is constant, 10 out of 10 in severity, sharp, radiating into left side of her back. She states she was given morphine at cancer center without any relief in pain. She reports nausea, but no vomiting or loose stool output from bag. She has mild cough, but no sputum production, chest pain or shortness breath. She also has burning on urination, but no dysuria or increased urinary frequency.   Currently patient denies fever, chills, running nose, sore throat, ear pain, headaches, hematuria, skin rashes or leg swelling. No unilateral weakness, numbness or tingling sensations. No vision change or hearing loss.  In ED, patient was found to have WBC 11.0, tachycardia, electrolytes okay, positive urinalysis for UTI. Chest x-ray showed retrocardiac density with left moderate pleural effusion. CT abdomen/pelvis showed worsening metastasized cancer, but no obstruction. Patient is admitted to inpatient for further evaluation and treatment.  Where does patient live?   At home    Can patient participate in ADLs? Barely   Review of Systems:   General: no fevers, chills, no changes in body weight, has poor appetite, has fatigue HEENT: no  blurry vision, hearing changes or sore throat Pulm: no dyspnea, has dry coughing, no wheezing CV: no chest pain, palpitations Abd: has nausea, no vomiting, has abdominal pain, no diarrhea, constipation GU: no dysuria, has burning on urination, no increased urinary frequency, hematuria  Ext: no leg edema Neuro: no unilateral weakness, numbness, or tingling, no vision change or hearing loss Skin: no rash MSK: No muscle spasm, no deformity, no limitation of range of movement in spin Heme: No easy bruising.  Travel history: No recent long distant travel.  Allergy: No Known Allergies  Past Medical History  Diagnosis Date  . Diabetes mellitus without complication   . Hypertension   . GERD (gastroesophageal reflux disease)   . Depression   . Colon cancer metastasized to multiple sites     Past Surgical History  Procedure Laterality Date  . Cholecystectomy    . Abdominal hysterectomy    . Laparotomy N/A 12/17/2014    Procedure: EXPLORATORY LAPAROTOMY Right, Transverse, partial left colectomy with en bloc partial gastrectomy illeostomy;  Surgeon: Georganna Skeans, MD;  Location: [place];  Service: General;  Laterality: N/A;  . Amputation Right 12/28/2014    Procedure: AMPUTATION BELOW KNEE;  Surgeon: Conrad Leonard, MD;  Location: [place];  Service: Vascular;  Laterality: Right;    Social History:  reports that she has never smoked. She does not have any smokeless tobacco history on file. She reports that she does not drink alcohol or use illicit drugs.  Family History:  Family History  Problem Relation Age of Onset  . Pneumonia Brother   . Heart attack Sister      Prior to Admission medications  Medication Sig Start Date End Date Taking? Authorizing Provider  acetaminophen (TYLENOL) 325 MG tablet Take 1-2 tablets (325-650 mg total) by mouth every 4 (four) hours as needed for mild pain (or temp >/= 101 F). 01/01/15  Yes Langley Gauss Moding, MD  amLODipine (NORVASC) 5 MG tablet Take 1 tablet (5  mg total) by mouth daily. 01/19/15  Yes Daniel J Angiulli, PA-C  docusate sodium (COLACE) 100 MG capsule Take 1 capsule (100 mg total) by mouth daily. 01/01/15  Yes Langley Gauss Moding, MD  enoxaparin (LOVENOX) 30 MG/0.3ML injection Inject 0.9 mLs (90 mg total) into the skin daily. 01/19/15  Yes Daniel J Angiulli, PA-C  FLUoxetine (PROZAC) 20 MG capsule Take 1 capsule (20 mg total) by mouth daily. 01/19/15  Yes Daniel J Angiulli, PA-C  gabapentin (NEURONTIN) 100 MG capsule Take 1 capsule (100 mg total) by mouth 3 (three) times daily. 01/19/15  Yes Daniel J Angiulli, PA-C  methocarbamol (ROBAXIN) 500 MG tablet Take 1 tablet (500 mg total) by mouth every 6 (six) hours as needed for muscle spasms. 01/19/15  Yes Daniel J Angiulli, PA-C  morphine (MSIR) 15 MG tablet Take 1 tablet (15 mg total) by mouth every 4 (four) hours as needed for severe pain. 01/24/15  Yes Truitt Merle, MD  oxyCODONE-acetaminophen (PERCOCET/ROXICET) 5-325 MG per tablet Take 1-2 tablets by mouth every 4 (four) hours as needed for moderate pain or severe pain. 01/19/15  Yes Daniel J Angiulli, PA-C  pantoprazole (PROTONIX) 40 MG tablet Take 1 tablet (40 mg total) by mouth daily. 01/19/15  Yes Cathlyn Parsons, PA-C    Physical Exam: Filed Vitals:   01/24/15 1852 01/24/15 1954 01/24/15 2115 01/24/15 2217  BP: 135/59 121/45 139/67   Pulse: 77 79 77   Temp: 98.3 F (36.8 C)     TempSrc: Oral     Resp: 20 20 18    Height:    5\' 11"  (1.803 m)  Weight:    57 kg (125 lb 10.6 oz)  SpO2: 100% 100% 100%    General: Not in acute distress HEENT:       Eyes: PERRL, EOMI, no scleral icterus.       ENT: No discharge from the ears and nose, no pharynx injection, no tonsillar enlargement.        Neck: No JVD, no bruit, no mass felt. Heme: No neck lymph node enlargement. Cardiac: S1/S2, RRR, No murmurs, No gallops or rubs. Pulm: Good air movement bilaterally. No rales, wheezing, rhonchi or rubs. Abd: There is tenderness in the left upper quadrant and left  lower quadrant. There is CVA tenderness (Left). There is no rebound and no guarding. There is ileostomy bag in place. Surgical abdominal wound packed, clean dry dressing. No visible discharge or bleeding. Ext: No pitting leg edema bilaterally. 2+DP/PT pulse bilaterally. Right leg: surgical BKA, bandage is clean and dry.  Musculoskeletal: No joint deformities, No joint redness or warmth, no limitation of ROM in spin. Skin: No rashes.  Neuro: Alert, oriented X3, cranial nerves II-XII grossly intact, muscle strength 5/5 in all extremities, sensation to light touch intact. Brachial reflex 2+ bilaterally. Knee reflex 1+ bilaterally. Negative Babinski's sign. Normal finger to nose test. Psych: Patient is not psychotic, no suicidal or hemocidal ideation.  Labs on Admission:  Basic Metabolic Panel:  Recent Labs Lab 01/24/15 1814  NA 138  K 4.0  CL 101  CO2 26  GLUCOSE 110*  BUN 19  CREATININE 0.73  CALCIUM 9.3   Liver Function Tests:  Recent Labs Lab 01/24/15 1814  AST 24  ALT 16  ALKPHOS 175*  BILITOT 0.5  PROT 7.6  ALBUMIN 2.8*   No results for input(s): LIPASE, AMYLASE in the last 168 hours. No results for input(s): AMMONIA in the last 168 hours. CBC:  Recent Labs Lab 01/19/15 0642 01/24/15 1814  WBC 8.5 11.0*  NEUTROABS  --  7.7  HGB 8.6* 9.8*  HCT 28.8* 32.3*  MCV 85.7 87.5  PLT 610* 537*   Cardiac Enzymes: No results for input(s): CKTOTAL, CKMB, CKMBINDEX, TROPONINI in the last 168 hours.  BNP (last 3 results) No results for input(s): BNP in the last 8760 hours.  ProBNP (last 3 results) No results for input(s): PROBNP in the last 8760 hours.  CBG:  Recent Labs Lab 01/18/15 1204 01/18/15 1607 01/18/15 2043 01/19/15 0646 01/19/15 1152  GLUCAP 133* 119* 102* 107* 102*    Radiological Exams on Admission: Dg Chest 2 View  01/24/2015   CLINICAL DATA:  Shortness of breath and LEFT lower flank pain. No other complaints. Hypertension and diabetes. Duration  unspecified.  EXAM: CHEST  2 VIEW  COMPARISON:  01/02/2015.  FINDINGS: Continued retrocardiac opacity with moderate-size LEFT pleural effusion. Atelectasis, and superimposed infiltrate not excluded. Normal cardiomediastinal silhouette otherwise. RIGHT lung clear. No bony abnormality.  Compared with priors, the RIGHT effusion is resolved. The LEFT effusion may be slightly improved.  IMPRESSION: Continued retrocardiac density along with moderate LEFT pleural effusion. Improved aeration on the RIGHT.  Followup PA and lateral chest X-ray is recommended in 3-4 weeks following trial of antibiotic therapy to ensure resolution and exclude underlying malignancy.   Electronically Signed   By: Rolla Flatten M.D.   On: 01/24/2015 20:28   Ct Abdomen Pelvis W Contrast  01/24/2015   CLINICAL DATA:  Diagnosed with colon cancer 1 month ago. Now with nausea and vomiting.  EXAM: CT ABDOMEN AND PELVIS WITH CONTRAST  TECHNIQUE: Multidetector CT imaging of the abdomen and pelvis was performed using the standard protocol following bolus administration of intravenous contrast.  CONTRAST:  131mL OMNIPAQUE IOHEXOL 300 MG/ML SOLN, 84mL OMNIPAQUE IOHEXOL 300 MG/ML SOLN  COMPARISON:  CT abdomen pelvis - 12/25/2014; 12/16/2014  FINDINGS: There is residual abnormal omental thickening about the left upper abdominal quadrant (representative images 14 and 18, series 2, worrisome for residual and/or locally recurrent malignancy. All exact measurements are difficult, the dominant component of this abnormal soft tissue within the left upper abdominal quadrant measures approximately 10.4 x 3.0 cm (image 19, series 2). These findings are associated with progressive retroperitoneal, porta hepatis and mesenteric adenopathy with index left-sided periaortic lymph node now measuring 1.2 cm in greatest short axis diameter (image 38, series 2), previously, 0.7 cm; index left periaortic lymph node now measuring approximately 1.3 cm in diameter (image 43, series  2), previously, 0.7 cm; index gastrohepatic ligament lymph node now measuring 1.2 cm in diameter (image 21, series 2), previously, 0.8 cm. Multiple metastatic implants are again noted about the ventral aspect of the omentum with index implant measuring approximately 1.1 cm in diameter (image 36, series 2).  Normal hepatic contour. Post cholecystectomy. Common bile duct remains mildly dilated, measuring approximately 1.1 cm in diameter with associated mild centralized intrahepatic biliary duct dilatation. Interval development of an approximately 1.3 cm hypo attenuating nodule within in the lateral segment of the left lobe of the liver adjacent to the fissure for ligamentum teres. Mid amount of focal fatty infiltration adjacent to the fissure for ligamentum teres (image 28, series  2). No ascites.  There is symmetric enhancement and excretion of the bilateral kidneys. No definite renal stones on this postcontrast examination. No discrete renal lesions. No urine obstruction or perinephric stranding.  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 Normal appearance of the right adrenal gland, normal appearance of the right adrenal gland and pancreas. Several calcifications are again noted about the splenic hilum.  Stable sequela of hemicolectomy and end ileostomy creation within the right lower abdominal quadrant. Enteric contrast extends to the level of the distal small bowel. No evidence of enteric obstruction. No pneumoperitoneum, pneumatosis or portal venous gas. There is mild diffuse stranding throughout the abdominal mesenteric without definable/drainable fluid collection.  Moderate amount of mixed calcified and noncalcified atherosclerotic plaque within a normal caliber abdominal aorta. The major branch vessels of the abdominal aorta appear patent on this non CTA examination.  Limited visualization lower thorax demonstrates a persistent small  to moderate size left-sided effusion. Resolved right-sided pleural effusion. Minimal residual bibasilar atelectasis with partial atelectasis / collapse of the imaged left lower lobe.  Normal heart size. Trace amount of pericardial fluid, potentially physiologic.  No acute or aggressive osseous abnormalities. Mild anterolisthesis of L4 upon L5 without associated pars defects. Mild-to-moderate multilevel lumbar spine DDD, worse at L5-S1 with disc space height loss, endplate irregularity and sclerosis. A hemangioma is noted within the T11 vertebral body.  There is an apparent open midline abdominal wound (image 51, series 2) without associated hernia formation. Mild diffuse body wall anasarca, improvement prior examination.  IMPRESSION: 1. Ill-defined soft tissue within the left upper abdominal quadrant worrisome for residual and/or locally recurrent disease with dominant residual soft tissue measuring approximately 10.4 cm in diameter. 2. Findings compatible with progression of metastatic disease with worsening retroperitoneal, porta-hepatis and mesenteric adenopathy and interval increase in size of suspected left adrenal gland metastasis. 3. Interval development of an approximately 1.3 cm liver lesion compatible with hepatic metastatic disease. 4. Post hemicolectomy and end ileostomy creation without evidence of enteric obstruction. Note of definable/drainable intra-abdominal fluid collection. 5. Persistent open midline abdominal wound without associated hernia.   Electronically Signed   By: Sandi Mariscal M.D.   On: 01/24/2015 20:13    EKG:  Not done in ED, will get one.   Assessment/Plan Principal Problem:   Abdominal pain Active Problems:   S/P partial colectomy   Thrombus   Colon cancer metastasized to multiple sites   Depression   Palliative care encounter   Amputation of right lower extremity below knee   Cough   Diabetes mellitus without complication   UTI (lower urinary tract infection)   GERD  (gastroesophageal reflux disease)  Abdominal pain: It is most likely due to worsening metastasized cancer. No obstruction on CT-abdomen/pelvis. Patient received 2 doses of Dilaudid (1 mg each) without any relief, still has pain of 10 out of 10 in severity. -will admit to tele bed given the tachycardia -start PCA protocol -Zofran for nausea -IVF  UTI and sepsis: Urinalysis is positive, and patient has burning on urination, consistent with UTI. Patient also has left flank pain with CVA tenderness, indicating possible pyelonephritis, however her left flank pain can also be from left abdominal pain radiation. Patient meets the criteria for sepsis with leukocytosis and tachycardia. -started Vancomycin and cefotaxime in ED, which is also for possible HCAP per ED. Will switch cefotaxime to Zosyn, and continue vancomycin -follow up blood and urine culture -will get Procalcitonin and trend  lactic acid level per sepsis protocol -IVF: 2.5L of NS bolus , followed by 100 cc/h  Cough: Chest x-ray showed retrocardiac density with left moderate pleural effusion. Her effusion is likely due to metastasized disease. The retrocardial density is concerning for pneumonia. Patient has mild nonproductive cough, but no chest pain or shortness of breath, so she may have questionable HCAP. Given that she is septic, will start antibodies for both UTI and questionable HCAP. - Vancomycin and Zosyn as above - Mucinex for cough  - DuoNebNeb prn for SOB - Urine legionella and S. pneumococcal antigen - will get sputum culture if she develops productive cough - May consider thoracentesis to rule out empyema if getting worse.   Metastasized colon cancer: She has been followed up and treated by Dr. Krista Blue. Last seen was today. Per Dr. Rhea Belton note, she is [phone], stage IV, with peritoneal and left adrenal metastasis. She has sporadic Lynch syndrome.Not curable, and overall prognosis is very poor. Not a good candidate for  chemotherapy. PDL 1 inhibitor was considered, unfortunately, PDL 1 inhibitor has not been approved by FDA. Dr Krista Blue plans to touch base with her insurance company to see if they would approve PDL 1 inhibitor pembrolizumab. -Symptomatic treatment for pain and nausea as above -follow up with Dr. Krista Blue  Hx of right leg ischemia: s/p of R BKA. -on Lovenx  Diet controled DM-II: Last A1c 6.3 on 12/24/14  -SSI  GERD: -Protonix  Depression: Stable, no suicidal or homicidal ideations. -Continue Prozac  Palliative care needed: Given her non-curable end-stage colon cancer, she should be benefited from palliative care consult. She reports that she talked to the palliative care team when she was in hospital last time, however she is not interested in talking to palliative care again now. She still wants to be full coded.   DVT ppx:  SQ Lovenox  Code Status: Full code Family Communication:  Yes, patient's  Two daughters     at bed side Disposition Plan: Admit to inpatient   Date of Service 01/24/2015    Ivor Costa [facility] Pager [phone]  If 7PM-7AM, please contact night-coverage [email or web address] Password [facility] 01/24/2015, 10:49 PM

## 2015-01-24 NOTE — Progress Notes (Addendum)
[place]  [phone]) [phone] [phone]  Clinic New Consult Note   Patient Care Team: No Pcp Per Patient as PCP - General (General Practice) 01/24/2015  CHIEF COMPLAINTS/PURPOSE OF CONSULTATION:  Metastatic colon cancer  HISTORY OF PRESENTING ILLNESS:  Kathryn Knapp 74 y.o. female is here because of recently diagnosed metastatic colon cancer. I saw her initially in the hospital about a months ago, she is here for follow-up after hospital discharge.  She was previously very active and independent until 12/16/2014 when was admitted to [facility] for abdominal pain nausea and vomiting. CT scan showed a perforated colon mass at splenic flexure with peritoneal carcinomatosis and a left adrenal mass. She  Underwent emergent surgery with exploratory laparotomy, right transverse and partial left colectomy with gastrectomy and ileostomy per Dr. Georganna Skeans.Her hospital course was complicated by respiratory failure, septic shock, acute renal failure, and the right LE ischemia requided below-knee amputation. She recovered from the acute conversations, and was discharged to a nursing home after prolonged hospital stay.  During her stay at the rehabilitation, she was able to do some rehabilitation, mainly in the wheelchair, able to transfer from chair to bathroom. She was finally released from rehabilitation and went home a week ago. She has been having left abdominal pain and left shoulder pain since hospital discharge, was intermittent, and positional, but it became constant for the past 3 days, stool output is about the same, (+) nausea, no vomiting, appettite is low since the surgery, eats very little, no fever or chills. She actually called to request urgent appointment today due to her worsening abdominal pain.     MEDICAL HISTORY:  Past Medical History  Diagnosis Date  . Diabetes mellitus without complication   . Hypertension     SURGICAL HISTORY: Past  Surgical History  Procedure Laterality Date  . Cholecystectomy    . Abdominal hysterectomy    . Laparotomy N/A 12/17/2014    Procedure: EXPLORATORY LAPAROTOMY Right, Transverse, partial left colectomy with en bloc partial gastrectomy illeostomy;  Surgeon: Georganna Skeans, MD;  Location: [place] [facility];  Service: General;  Laterality: N/A;  . Amputation Right 12/28/2014    Procedure: AMPUTATION BELOW KNEE;  Surgeon: Conrad Abbyville, MD;  Location: [place];  Service: Vascular;  Laterality: Right;    SOCIAL HISTORY: History   Social History  . Marital Status: Divorced    Spouse Name: N/A  . Number of Children: N/A  . Years of Education: N/A   Occupational History  . Not on file.   Social History Main Topics  . Smoking status: Never Smoker   . Smokeless tobacco: Not on file  . Alcohol Use: No  . Drug Use: No  . Sexual Activity: Not on file   Other Topics Concern  . Not on file   Social History Narrative    FAMILY HISTORY: History reviewed. No pertinent family history.  ALLERGIES:  has No Known Allergies.  MEDICATIONS:  No current facility-administered medications for this visit.   Current Outpatient Prescriptions  Medication Sig Dispense Refill  . enoxaparin (LOVENOX) 30 MG/0.3ML injection Inject 0.9 mLs (90 mg total) into the skin daily. 30 Syringe 1  . acetaminophen (TYLENOL) 325 MG tablet Take 1-2 tablets (325-650 mg total) by mouth every 4 (four) hours as needed for mild pain (or temp >/= 101 F).    Marland Kitchen amLODipine (NORVASC) 5 MG tablet Take 1 tablet (5 mg total) by mouth daily. 30 tablet 1  . docusate sodium (COLACE)  100 MG capsule Take 1 capsule (100 mg total) by mouth daily. 10 capsule 0  . FLUoxetine (PROZAC) 20 MG capsule Take 1 capsule (20 mg total) by mouth daily. 30 capsule 3  . gabapentin (NEURONTIN) 100 MG capsule Take 1 capsule (100 mg total) by mouth 3 (three) times daily. 90 capsule 1  . methocarbamol (ROBAXIN) 500 MG tablet Take 1 tablet (500 mg total) by mouth every 6  (six) hours as needed for muscle spasms. 90 tablet 0  . morphine (MSIR) 15 MG tablet Take 1 tablet (15 mg total) by mouth every 4 (four) hours as needed for severe pain. 60 tablet 0  . oxyCODONE-acetaminophen (PERCOCET/ROXICET) 5-325 MG per tablet Take 1-2 tablets by mouth every 4 (four) hours as needed for moderate pain or severe pain. 90 tablet 0  . pantoprazole (PROTONIX) 40 MG tablet Take 1 tablet (40 mg total) by mouth daily. 30 tablet 1   Facility-Administered Medications Ordered in Other Visits  Medication Dose Route Frequency Provider Last Rate Last Dose  . HYDROmorphone (DILAUDID) injection 1 mg  1 mg Intravenous Once Noland Fordyce, PA-C        REVIEW OF SYSTEMS:   Constitutional: Denies fevers, chills or abnormal night sweats, (+) fatigue Eyes: Denies blurriness of vision, double vision or watery eyes Ears, nose, mouth, throat, and face: Denies mucositis or sore throat Respiratory: Denies cough, dyspnea or wheezes Cardiovascular: Denies palpitation, chest discomfort or lower extremity swelling Gastrointestinal:  See HPI  Skin: Denies abnormal skin rashes Lymphatics: Denies new lymphadenopathy or easy bruising Neurological:Denies numbness, tingling or new weaknesses Behavioral/Psych: Mood is stable, no new changes  All other systems were reviewed with the patient and are negative.  PHYSICAL EXAMINATION: ECOG PERFORMANCE STATUS: 4 - Bedbound  Filed Vitals:   01/24/15 1435  BP: 124/72  Pulse: 107  Temp: 97.4 F (36.3 C)  Resp: 20   Filed Weights    GENERAL:alert, no distress and comfortable SKIN: skin color, texture, turgor are normal, no rashes or significant lesions EYES: normal, conjunctiva are pink and non-injected, sclera clear OROPHARYNX:no exudate, no erythema and lips, buccal mucosa, and tongue normal  NECK: supple, thyroid normal size, non-tender, without nodularity LYMPH:  no palpable lymphadenopathy in the cervical, axillary or inguinal LUNGS: clear to  auscultation and percussion with normal breathing effort on right, significant decrease of breath sound on the left upper to mid chest.  HEART: regular rate & rhythm and no murmurs and no lower extremity edema ABDOMEN:abdomen soft, non-tender and normal bowel sounds Musculoskeletal:no cyanosis of digits and no clubbing  PSYCH: alert & oriented x 3 with fluent speech NEURO: no focal motor/sensory deficits  LABORATORY DATA:  I have reviewed the data as listed Lab Results  Component Value Date   WBC 11.0* 01/24/2015   HGB 9.8* 01/24/2015   HCT 32.3* 01/24/2015   MCV 87.5 01/24/2015   PLT 537* 01/24/2015    Recent Labs  12/25/14 0458  01/02/15 0539 01/05/15 1152 01/08/15 0618 01/15/15 0621 01/24/15 1814  NA 139  < > 138 139  --   --  138  K 2.9*  < > 3.4* 4.5  --   --  4.0  CL 103  < > 103 101  --   --  101  CO2 28  < > 28 31  --   --  26  GLUCOSE 138*  < > 98 121*  --   --  110*  BUN 32*  < > 8 7  --   --  19  CREATININE 1.67*  < > 0.88 0.87 0.78 0.67 0.73  CALCIUM 7.0*  < > 7.4* 7.7*  --   --  9.3  GFRNONAA 29*  < > >60 >60 >60 >60 >60  GFRAA 34*  < > >60 >60 >60 >60 >60  PROT 4.1*  --  4.7*  --   --   --  7.6  ALBUMIN 1.1*  --  <1.0*  --   --   --  2.8*  AST 49*  --  20  --   --   --  24  ALT 34  --  13*  --   --   --  16  ALKPHOS 86  --  112  --   --   --  175*  BILITOT 0.4  --  0.3  --   --   --  0.5  < > = values in this interval not displayed.  Pathology reports: Diagnosis 12/17/2014 Colon, segmental resection for tumor, right, transverse, partial left, and portion of stomach - PERFORATED WELL TO MODERATELY DIFFERENTIATED ADENOCARCINOMA, SPANNING 13 CM IN GREATEST DIMENSION. - ADENOCARCINOMA INVADES THROUGH BOWEL WALL TO INVOLVE THE SEROSA OF THE STOMACH. - MESENTERIC MARGIN IS GROSSLY INVOLVED WITH TUMOR. - PROXIMAL AND DISTAL COLONIC MARGINS DEMONSTRATE ACUTE SEROSITIS, BUT ARE NEGATIVE FOR TUMOR. - STOMACH MARGIN IS NEGATIVE FOR TUMOR. - LYMPH/VASCULAR  INVASION IS PRESENT WITH 6 OF 13 LYMPH NODES POSITIVE FOR METASTATIC ADENOCARCINOMA (6/13). - EXTRACAPSULAR EXTENSION IS IDENTIFIED. - MULTIPLE (GREATER THAN 5) Microscopic Comment COLON AND RECTUM: Specimen: Right, transverse, and left colon with portion of terminal ileum, and attached portion of stomach. Procedure: Right, transverse, and partial left colectomy with en-bloc partial gastrectomy. Tumor site: Distal transverse/proximal left colon. Specimen integrity: The specimen is disrupted / perforated in the area of tumor, 7 cm from the distal margin. Macroscopic tumor perforation: Present. Invasive tumor: Maximum size: 13 cm. Histologic type(s): Adenocarcinoma. Histologic grade and differentiation: G1 - G2: well to moderately differentiated/low grade. Type of polyp in which invasive carcinoma arose: A precursor polyp is not identified. Microscopic extension of invasive tumor: Tumor invades through bowel wall to involve the serosa surface of the stomach. Lymph-Vascular invasion: Yes, present. Peri-neural invasion: Not identified. Tumor deposit(s) (discontinuous extramural extension): Yes, multiple tumor deposits in the omentum. Resection margins: Proximal colonic margin: Negative. Distal colonic margin: Negative. Stomach margin: Negative. Mesenteric margin (sigmoid and transverse): Grossly positive. Distance closest margin (if all above margins negative): Mesenteric margin is grossly positive. Treatment effect (neo-adjuvant therapy): Not applicable. Additional polyp(s): None identified. Additional findings: Acute serositis involves the proximal and distal colonic margins (likely due to tumor rupture). Lymph nodes: number examined 13; number positive: 6. Pathologic Staging: pT4b, pN2a. Ancillary studies: The tumor will be sent for MSI testing by PCR and MMR by [facility] per [facility] Working Group protocol. (RH:ds 12/19/14)  ADDITIONAL INFORMATION: Mismatch Repair (MMR) Protein  Immunohistochemistry (IHC) IHC Expression Result: MLH1: LOSS OF NUCLEAR EXPRESSION (LESS THAN 5% TUMOR EXPRESSION) MSH2: Preserved nuclear expression (greater 50% tumor expression) MSH6: Preserved nuclear expression (greater 50% tumor expression) PMS2: LOSS OF NUCLEAR EXPRESSION (LESS THAN 5% TUMOR EXPRESSION) * Internal control demonstrates intact nuclear expression  MSI-high  BRAF V600E1 (+)  RADIOGRAPHIC STUDIES: I have personally reviewed the radiological images as listed and agreed with the findings in the report. Ct Chest, Abdomen Pelvis W contrast 12/17/2014 IMPRESSION: 1. Large amount of free air and free fluid within the upper abdomen follow-up rarely about the liver, compatible with  acute perforation. This likely reflects perforation at the level of the large colonic malignancy, though the site of perforation is not definitely characterized on this study. 2. Large left splenic flexure colonic mass again noted, with diffuse surrounding nodularity, reflecting local spread of disease. Extensive surrounding soft tissue inflammation is mildly more prominent than on the prior study. 3. Small left pleural effusion, new from the recent prior study, likely reactive secondary to bowel perforation. Bibasilar atelectasis noted. Lungs otherwise clear. 4. Scattered coronary artery calcifications seen. 5. Small pericardial effusion again noted. 6. 2.5 cm left adrenal lesion again raises concern for metastasis.   Dg Shoulder Left  01/10/2015   CLINICAL DATA:  Nontraumatic left shoulder pain, initial encounter  EXAM: LEFT SHOULDER - 2+ VIEW  COMPARISON:  Chest radiograph 01/02/2015, chest CT 12/17/2014  FINDINGS: There is no evidence of fracture or dislocation. There is no evidence of arthropathy or other focal bone abnormality. Soft tissues are unremarkable. Aortic ectasia and soft tissue left suprahilar fullness reidentified, incompletely imaged, but likely corresponding to vascular  pedicle as seen on the dedicated prior exam 12/17/2014.  IMPRESSION: Negative.   Electronically Signed   By: Conchita Paris M.D.   On: 01/10/2015 12:18    ASSESSMENT & PLAN:  74 year old female   1. Metastatic colon cancer, [id number], stage IV, with peritoneal and left adrenal metastasis. MSI-H, BRAF V600E1 (+)  -she has sporadic Lynch syndrome,  based on her MS I and BRAFmutation test results.  -I reviewed her scan and surgical path findings. Unfortunately she has metastatic disease, not curable, and overall prognosis is very poor, due to her severe complications after surgery and poor performance status.  -We discussed the option of chemotherapy, given her poor performance status , she not a good candidate for chemotherapy.  -We discussed the alternative option of immunotherapy. Studies have shown and good response to PDL 1 inhibitor in patients with MSI high colon cancer. Unfortunately, PDL 1 inhibitor has not been approved by FDA, I'll touch base with her insurance company to see if they would approve PDL 1 inhibitor pembrolizumab.  2. Worsening left abdominal pain -Giving her to severe abdominal pain, poor by mouth intake, I'll send her to [facility] emergency room today for further evaluation. -I give her a prescription of morphine 15 mg every 6 hours as needed. She did not respond to Percocet very well.  3. Right foot ischemia, status post BKA -She is running out of Lovenox, I give her some sample from our pharmacy today. -I'll likely change her to Coumadin next week.  4. Anorexia, weight loss -Dietitian consult, social work referral  5. Probable left pleural effusion -need CXR or CT for further evaluation.     Plan: -I sent her from our clinic to [facility] emergency room for further evaluation of her abdominal pain -If no admission needed, I'll see her back next week in my clinic.  Orders Placed This Encounter  Procedures  . Ambulatory referral to Social Work    Referral Priority:   Routine    Referral Type:  Consultation    Referral Reason:  Specialty Services Required    Number of Visits Requested:  1    All questions were answered. The patient knows to call the clinic with any problems, questions or concerns. I spent 55 minutes counseling the patient face to face. The total time spent in the appointment was 60 minutes and more than 50% was on counseling.     Truitt Merle, MD 01/24/2015 7:46 PM

## 2015-01-24 NOTE — Progress Notes (Signed)
Spoke w/ pt's daughter regarding getting assistance with her meds.  After speaking with Dr. Burr Medico there is no assistance I can offer the pt at this time because she is not on active treatment.  After her scan is completed and reviewed Dr. Burr Medico will know which treatment options are available.  I gave the her daughter my card to contact me once a treatment plan has been established.  She verbalized understanding.

## 2015-01-24 NOTE — Progress Notes (Signed)
Pt here with daughters & granddaughter.  Pt hurting & in tears with pain on Left side of body.  Family expressed concerns @ getting medications due to finances.  Pt feels anxious & depressed-almost to point of hopelessness but doesn't want to express this in front of children.  Called & spoke with Lenise/financial advocate & she reports that family can come speak with her.  Also called Abby Potash Hock/SW & will ask her to speak with pt/family tomorrow regarding social/psychological issues.

## 2015-01-24 NOTE — ED Provider Notes (Signed)
CSN: [id number]     Arrival date & time 01/24/15  1620 History   First MD Initiated Contact with Patient 01/24/15 1658     Chief Complaint  Patient presents with  . Abdominal Pain     (Consider location/radiation/quality/duration/timing/severity/associated sxs/prior Treatment) HPI  Pt is a 74yo female with hx of HTN, DM, and colon cancer with hx of exploratory laparotomy on 12/17/14 by Dr. Georganna Skeans, with Right, transverse, partial Left colectomy with en bloc partial gastrectomy ileostomy, sent to ED from cancer center for gradually worsening Left side pain that started in her head and ran down Left side of body. 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 Adele Barthel due to blood clot after surgery.  Pt states pain is worse in Left side of abdomen, radiating into Left side of her back. Pain is constant, sharp, 10/10, worse with palpation. States she was given morphine at cancer center, dose not recall the dose but states she had no relief in pain.  Reports nausea but no vomiting. Reports burning with urination the last 2 times she urinated.  Denies fever or chills.    Past Medical History  Diagnosis Date  . Diabetes mellitus without complication   . Hypertension   . GERD (gastroesophageal reflux disease)   . Depression   . Colon cancer metastasized to multiple sites    Past Surgical History  Procedure Laterality Date  . Cholecystectomy    . Abdominal hysterectomy    . Laparotomy N/A 12/17/2014    Procedure: EXPLORATORY LAPAROTOMY Right, Transverse, partial left colectomy with en bloc partial gastrectomy illeostomy;  Surgeon: Georganna Skeans, MD;  Location: [facility];  Service: General;  Laterality: N/A;  . Amputation Right 12/28/2014    Procedure: AMPUTATION BELOW KNEE;  Surgeon: Conrad Hatillo, MD;  Location: [facility];  Service: Vascular;  Laterality: Right;   Family History  Problem Relation Age of Onset  . Pneumonia Brother   . Heart attack Sister    History  Substance Use Topics  .  Smoking status: Never Smoker   . Smokeless tobacco: Not on file  . Alcohol Use: No   OB History    No data available     Review of Systems  Constitutional: Negative for fever, chills, diaphoresis, appetite change and fatigue.  Respiratory: Negative for cough and shortness of breath.   Cardiovascular: Negative for chest pain, palpitations and leg swelling.  Gastrointestinal: Positive for abdominal pain (Left side). Negative for nausea, vomiting and diarrhea.  Genitourinary: Positive for dysuria and flank pain (Left). Negative for frequency and hematuria.  Musculoskeletal: Positive for back pain (Left side). Negative for myalgias.  All other systems reviewed and are negative.     Allergies  Review of patient's allergies indicates no known allergies.  Home Medications   Prior to Admission medications   Medication Sig Start Date End Date Taking? Authorizing Provider  acetaminophen (TYLENOL) 325 MG tablet Take 1-2 tablets (325-650 mg total) by mouth every 4 (four) hours as needed for mild pain (or temp >/= 101 F). 01/01/15  Yes Langley Gauss Moding, MD  amLODipine (NORVASC) 5 MG tablet Take 1 tablet (5 mg total) by mouth daily. 01/19/15  Yes Daniel J Angiulli, PA-C  docusate sodium (COLACE) 100 MG capsule Take 1 capsule (100 mg total) by mouth daily. 01/01/15  Yes Langley Gauss Moding, MD  enoxaparin (LOVENOX) 30 MG/0.3ML injection Inject 0.9 mLs (90 mg total) into the skin daily. 01/19/15  Yes Lavon Paganini Angiulli, PA-C  FLUoxetine (PROZAC) 20 MG capsule Take 1 capsule (20 mg total) by mouth daily. 01/19/15  Yes Daniel J Angiulli, PA-C  gabapentin (NEURONTIN) 100 MG capsule Take 1 capsule (100 mg total) by mouth 3 (three) times daily. 01/19/15  Yes Daniel J Angiulli, PA-C  methocarbamol (ROBAXIN) 500 MG tablet Take 1 tablet (500 mg total) by mouth every 6 (six) hours as needed for muscle spasms. 01/19/15  Yes Daniel J Angiulli, PA-C  morphine (MSIR) 15 MG tablet Take 1 tablet (15 mg total) by mouth every 4  (four) hours as needed for severe pain. 01/24/15  Yes Truitt Merle, MD  oxyCODONE-acetaminophen (PERCOCET/ROXICET) 5-325 MG per tablet Take 1-2 tablets by mouth every 4 (four) hours as needed for moderate pain or severe pain. 01/19/15  Yes Daniel J Angiulli, PA-C  pantoprazole (PROTONIX) 40 MG tablet Take 1 tablet (40 mg total) by mouth daily. 01/19/15  Yes Daniel J Angiulli, PA-C   BP 139/67 mmHg  Pulse 77  Temp(Src) 98.3 F (36.8 C) (Oral)  Resp 18  Ht 5\' 1"  (1.549 m)  Wt 126 lb (57.153 kg)  BMI 23.82 kg/m2  SpO2 100% Physical Exam  Constitutional: She appears well-developed and well-nourished. No distress.  Thin female lying in exam bed, NAD  HENT:  Head: Normocephalic and atraumatic.  Eyes: Conjunctivae are normal. No scleral icterus.  Neck: Normal range of motion. Neck supple.  Cardiovascular: Regular rhythm and normal heart sounds.  Tachycardia present.   Pulmonary/Chest: Effort normal and breath sounds normal. No respiratory distress. She has no wheezes. She has no rales. She exhibits no tenderness.  Abdominal: Soft. Bowel sounds are normal. She exhibits no distension and no mass. There is tenderness in the left upper quadrant and left lower quadrant. There is CVA tenderness (Left). There is no rebound and no guarding.  ileostomy bag in place. Dry clean dressing in middle of abdomen.  Surgical wound nearly packed, clean dry dressing. No visible discharge or bleeding.  (bandage/packing not removed as pt had changes just PTA at cancer center)  Musculoskeletal: Normal range of motion.  Right leg: surgical BKA, bandage is clean and dry.  Neurological: She is alert.  Skin: Skin is warm and dry. She is not diaphoretic.  Nursing note and vitals reviewed.   ED Course  Procedures (including critical care time) Labs Review Labs Reviewed  COMPREHENSIVE METABOLIC PANEL - Abnormal; Notable for the following:    Glucose, Bld 110 (*)    Albumin 2.8 (*)    Alkaline Phosphatase 175 (*)    All  other components within normal limits  CBC WITH DIFFERENTIAL/PLATELET - Abnormal; Notable for the following:    WBC 11.0 (*)    RBC 3.69 (*)    Hemoglobin 9.8 (*)    HCT 32.3 (*)    RDW 19.3 (*)    Platelets 537 (*)    All other components within normal limits  URINALYSIS, ROUTINE W REFLEX MICROSCOPIC (NOT AT [facility]) - Abnormal; Notable for the following:    Color, Urine AMBER (*)    APPearance CLOUDY (*)    Hgb urine dipstick SMALL (*)    Bilirubin Urine SMALL (*)    Protein, ur 30 (*)    Leukocytes, UA MODERATE (*)    All other components within normal limits  URINE MICROSCOPIC-ADD ON - Abnormal; Notable for the following:    Squamous Epithelial / LPF FEW (*)    Casts HYALINE CASTS (*)    All other components within normal limits  URINE CULTURE  CULTURE, BLOOD (ROUTINE X 2)  CULTURE, BLOOD (ROUTINE X 2)  CULTURE, BLOOD (ROUTINE X 2)  CULTURE, BLOOD (ROUTINE X 2)  CULTURE, EXPECTORATED SPUTUM-ASSESSMENT  GRAM STAIN  LACTIC ACID, PLASMA  LACTIC ACID, PLASMA  PROCALCITONIN  PROTIME-INR  APTT  LEGIONELLA ANTIGEN, URINE  STREP PNEUMONIAE URINARY ANTIGEN  COMPREHENSIVE METABOLIC PANEL  CBC    Imaging Review Dg Chest 2 View  01/24/2015   CLINICAL DATA:  Shortness of breath and LEFT lower flank pain. No other complaints. Hypertension and diabetes. Duration unspecified.  EXAM: CHEST  2 VIEW  COMPARISON:  01/02/2015.  FINDINGS: Continued retrocardiac opacity with moderate-size LEFT pleural effusion. Atelectasis, and superimposed infiltrate not excluded. Normal cardiomediastinal silhouette otherwise. RIGHT lung clear. No bony abnormality.  Compared with priors, the RIGHT effusion is resolved. The LEFT effusion may be slightly improved.  IMPRESSION: Continued retrocardiac density along with moderate LEFT pleural effusion. Improved aeration on the RIGHT.  Followup PA and lateral chest X-ray is recommended in 3-4 weeks following trial of antibiotic therapy to ensure resolution and exclude  underlying malignancy.   Electronically Signed   By: Rolla Flatten M.D.   On: 01/24/2015 20:28   Ct Abdomen Pelvis W Contrast  01/24/2015   CLINICAL DATA:  Diagnosed with colon cancer 1 month ago. Now with nausea and vomiting.  EXAM: CT ABDOMEN AND PELVIS WITH CONTRAST  TECHNIQUE: Multidetector CT imaging of the abdomen and pelvis was performed using the standard protocol following bolus administration of intravenous contrast.  CONTRAST:  121mL OMNIPAQUE IOHEXOL 300 MG/ML SOLN, 75mL OMNIPAQUE IOHEXOL 300 MG/ML SOLN  COMPARISON:  CT abdomen pelvis - 12/25/2014; 12/16/2014  FINDINGS: There is residual abnormal omental thickening about the left upper abdominal quadrant (representative images 14 and 18, series 2, worrisome for residual and/or locally recurrent malignancy. All exact measurements are difficult, the dominant component of this abnormal soft tissue within the left upper abdominal quadrant measures approximately 10.4 x 3.0 cm (image 19, series 2). These findings are associated with progressive retroperitoneal, porta hepatis and mesenteric adenopathy with index left-sided periaortic lymph node now measuring 1.2 cm in greatest short axis diameter (image 38, series 2), previously, 0.7 cm; index left periaortic lymph node now measuring approximately 1.3 cm in diameter (image 43, series 2), previously, 0.7 cm; index gastrohepatic ligament lymph node now measuring 1.2 cm in diameter (image 21, series 2), previously, 0.8 cm. Multiple metastatic implants are again noted about the ventral aspect of the omentum with index implant measuring approximately 1.1 cm in diameter (image 36, series 2).  Normal hepatic contour. Post cholecystectomy. Common bile duct remains mildly dilated, measuring approximately 1.1 cm in diameter with associated mild centralized intrahepatic biliary duct dilatation. Interval development of an approximately 1.3 cm hypo attenuating nodule within in the lateral segment of the left lobe of the  liver adjacent to the fissure for ligamentum teres. Mid amount of focal fatty infiltration adjacent to the fissure for ligamentum teres (image 28, series 2). No ascites.  There is symmetric enhancement and excretion of the bilateral kidneys. No definite renal stones on this postcontrast examination. No discrete renal lesions. No urine obstruction or perinephric stranding.  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 Normal appearance of the right adrenal gland, normal appearance of the right adrenal gland and pancreas. Several calcifications are again noted about the splenic hilum.  Stable sequela of hemicolectomy and end ileostomy creation within the right lower abdominal quadrant. Enteric contrast extends to  the level of the distal small bowel. No evidence of enteric obstruction. No pneumoperitoneum, pneumatosis or portal venous gas. There is mild diffuse stranding throughout the abdominal mesenteric without definable/drainable fluid collection.  Moderate amount of mixed calcified and noncalcified atherosclerotic plaque within a normal caliber abdominal aorta. The major branch vessels of the abdominal aorta appear patent on this non CTA examination.  Limited visualization lower thorax demonstrates a persistent small to moderate size left-sided effusion. Resolved right-sided pleural effusion. Minimal residual bibasilar atelectasis with partial atelectasis / collapse of the imaged left lower lobe.  Normal heart size. Trace amount of pericardial fluid, potentially physiologic.  No acute or aggressive osseous abnormalities. Mild anterolisthesis of L4 upon L5 without associated pars defects. Mild-to-moderate multilevel lumbar spine DDD, worse at L5-S1 with disc space height loss, endplate irregularity and sclerosis. A hemangioma is noted within the T11 vertebral body.  There is an apparent open midline abdominal wound (image 51, series 2)  without associated hernia formation. Mild diffuse body wall anasarca, improvement prior examination.  IMPRESSION: 1. Ill-defined soft tissue within the left upper abdominal quadrant worrisome for residual and/or locally recurrent disease with dominant residual soft tissue measuring approximately 10.4 cm in diameter. 2. Findings compatible with progression of metastatic disease with worsening retroperitoneal, porta-hepatis and mesenteric adenopathy and interval increase in size of suspected left adrenal gland metastasis. 3. Interval development of an approximately 1.3 cm liver lesion compatible with hepatic metastatic disease. 4. Post hemicolectomy and end ileostomy creation without evidence of enteric obstruction. Note of definable/drainable intra-abdominal fluid collection. 5. Persistent open midline abdominal wound without associated hernia.   Electronically Signed   By: Sandi Mariscal M.D.   On: 01/24/2015 20:13     EKG Interpretation None      MDM   Final diagnoses:  Left sided abdominal pain   Pt is a 75yo female sent to ED by oncology for further evaluation of Left sided pain.  Pt had exploratory laparotomy and Right BKA last month for colon cancer and surgery complications.   Pt is afebrile, but tachycardic upon arrival. Tenderness to Left side of abdomen.  Labs: slight leukocytosis with WBC-11.0, UA moderate leukocytes with 11-20 WBC but nitrite negative. Urine culture sent.  Imaging concerning for worsening metastatic disease as well as moderate Left pleural effusion.  Discussed pt with Dr. Venora Maples who also examined pt and imaging. Will tx pt for HCAP and have her admitted as pt has had difficulty caring for herself at home due to severe Left sided pain. Pt was given a total of 2mg  dilaudid in ED but pain remains 10/10.  Pt has not been eating well at home.  Pt would benefit from hospitalization for tx of pain and HCAP.  IV antibiotics for HCAP order set used.  Consulted with Dr. Blaine Hamper,  Hospitalist, agreed to have pt admitted to a tele bed.    Noland Fordyce, PA-C 01/24/15 [place], MD 01/25/15 [phone]

## 2015-01-25 ENCOUNTER — Ambulatory Visit: Payer: Medicare Other

## 2015-01-25 ENCOUNTER — Encounter: Payer: Self-pay | Admitting: *Deleted

## 2015-01-25 ENCOUNTER — Inpatient Hospital Stay: Payer: Medicare Other | Admitting: Hematology

## 2015-01-25 DIAGNOSIS — R634 Abnormal weight loss: Secondary | ICD-10-CM

## 2015-01-25 DIAGNOSIS — F329 Major depressive disorder, single episode, unspecified: Secondary | ICD-10-CM

## 2015-01-25 DIAGNOSIS — G893 Neoplasm related pain (acute) (chronic): Secondary | ICD-10-CM

## 2015-01-25 DIAGNOSIS — K219 Gastro-esophageal reflux disease without esophagitis: Secondary | ICD-10-CM

## 2015-01-25 DIAGNOSIS — I998 Other disorder of circulatory system: Secondary | ICD-10-CM

## 2015-01-25 DIAGNOSIS — E43 Unspecified severe protein-calorie malnutrition: Secondary | ICD-10-CM

## 2015-01-25 DIAGNOSIS — R630 Anorexia: Secondary | ICD-10-CM

## 2015-01-25 DIAGNOSIS — C189 Malignant neoplasm of colon, unspecified: Secondary | ICD-10-CM

## 2015-01-25 DIAGNOSIS — C786 Secondary malignant neoplasm of retroperitoneum and peritoneum: Secondary | ICD-10-CM

## 2015-01-25 DIAGNOSIS — N390 Urinary tract infection, site not specified: Secondary | ICD-10-CM

## 2015-01-25 DIAGNOSIS — C7972 Secondary malignant neoplasm of left adrenal gland: Secondary | ICD-10-CM

## 2015-01-25 LAB — URINE CULTURE: Colony Count: 8000

## 2015-01-25 LAB — PROCALCITONIN

## 2015-01-25 LAB — COMPREHENSIVE METABOLIC PANEL WITH GFR
ALT: 12 U/L — ABNORMAL LOW (ref 14–54)
AST: 19 U/L (ref 15–41)
Albumin: 2.4 g/dL — ABNORMAL LOW (ref 3.5–5.0)
Alkaline Phosphatase: 146 U/L — ABNORMAL HIGH (ref 38–126)
Anion gap: 8 (ref 5–15)
BUN: 15 mg/dL (ref 6–20)
CO2: 25 mmol/L (ref 22–32)
Calcium: 8.5 mg/dL — ABNORMAL LOW (ref 8.9–10.3)
Chloride: 104 mmol/L (ref 101–111)
Creatinine, Ser: 0.56 mg/dL (ref 0.44–1.00)
GFR calc Af Amer: >60 mL/min
GFR calc non Af Amer: >60 mL/min
Glucose, Bld: 95 mg/dL (ref 65–99)
Potassium: 3.6 mmol/L (ref 3.5–5.1)
Sodium: 137 mmol/L (ref 135–145)
Total Bilirubin: 0.5 mg/dL (ref 0.3–1.2)
Total Protein: 6.3 g/dL — ABNORMAL LOW (ref 6.5–8.1)

## 2015-01-25 LAB — LEGIONELLA ANTIGEN, URINE

## 2015-01-25 LAB — CBC
HCT: 30.1 % — ABNORMAL LOW (ref 36.0–46.0)
Hemoglobin: 9 g/dL — ABNORMAL LOW (ref 12.0–15.0)
MCH: 26.2 pg (ref 26.0–34.0)
MCHC: 29.9 g/dL — AB (ref 30.0–36.0)
MCV: 87.8 fL (ref 78.0–100.0)
Platelets: 440 10*3/uL — ABNORMAL HIGH (ref 150–400)
RBC: 3.43 MIL/uL — ABNORMAL LOW (ref 3.87–5.11)
RDW: 19.6 % — AB (ref 11.5–15.5)
WBC: 7.7 10*3/uL (ref 4.0–10.5)

## 2015-01-25 LAB — STREP PNEUMONIAE URINARY ANTIGEN: Strep Pneumo Urinary Antigen: NEGATIVE

## 2015-01-25 LAB — GLUCOSE, CAPILLARY
GLUCOSE-CAPILLARY: 81 mg/dL (ref 65–99)
GLUCOSE-CAPILLARY: 113 mg/dL — AB (ref 65–99)
Glucose-Capillary: 99 mg/dL (ref 65–99)

## 2015-01-25 LAB — LACTIC ACID, PLASMA: LACTIC ACID, VENOUS: 0.5 mmol/L (ref 0.5–2.0)

## 2015-01-25 MED ORDER — OXYCODONE-ACETAMINOPHEN 5-325 MG PO TABS
1.0000 | ORAL_TABLET | ORAL | Status: DC | PRN
  Administered 2015-01-25 – 2015-01-27 (×6): 2 via ORAL
  Filled 2015-01-25 (×6): qty 2

## 2015-01-25 MED ORDER — BOOST / RESOURCE BREEZE PO LIQD
1.0000 | Freq: Two times a day (BID) | ORAL | Status: DC
  Administered 2015-01-25 – 2015-01-27 (×3): 1 via ORAL

## 2015-01-25 NOTE — Progress Notes (Signed)
PT Cancellation Note  Patient Details Name: Kathryn Knapp MRN: [id number] DOB: [birth year]   Cancelled Treatment:    Reason Eval/Treat Not Completed: Pain limiting ability to participate Pt reports she is not able to tolerate therapy today.  She reports she has been up all night with pain, has PCA but fearful of overmedicating herself despite also talking with RN about dosages.  Agreeable for PT to check back tomorrow.  Will check back as schedule permits.   Braydin Aloi,[name] 01/25/2015, 9:53 AM Carmelia Bake, PT, DPT 01/25/2015 Pager: [phone]

## 2015-01-25 NOTE — Progress Notes (Signed)
Initial Nutrition Assessment  DOCUMENTATION CODES:  Underweight, Severe malnutrition in context of chronic illness  INTERVENTION: - Continue Ensure Enlive po BID, each supplement provides 350 kcal and 20 grams of protein - Will order Resource Breeze po BID, each supplement provides 250 kcal and 9 grams of protein -RD will continue to monitor for needs   NUTRITION DIAGNOSIS:  Increased nutrient needs related to cancer and cancer related treatments, chronic illness as evidenced by estimated needs.  GOAL:  Patient will meet greater than or equal to 90% of their needs  MONITOR:  PO intake, Supplement acceptance, Weight trends, Labs, I & O's   ASSESSMENT: 74 y.o. female with PMH of HTN, diet control DM, metastasized colon cancer, s/p of exploratory laparotomy on 12/17/14 by Dr. 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  Pt seen for MST and underweight BMI. Pt on CLD at this time with breakfast untouched and no intakes documented. She drank 100% of Ensure Enlive this AM. Pt reports that she does not particularly like it but understands the importance of drinking supplements with decreased appetite for the past few months. Talked with her about [employer] and she is interested in trying this supplement.  Per notes, pt with financial issues. Talked with GI Navigator in the room and she reports she has contacted Dory Peru, [place] RD, to find out if pt can receive supplements for home use. Called this RD and awaiting return call to see how she can be assisted on d/c. Pt does report that she likes butter pecan-flavored Glucerna.  Not able to meet needs with current diet order. Labs and medications reviewed. Moderate muscle and fat wasting noted to R-side of body as  severe pain in L-side per pt report. Noted R BKA.  Height:  Ht Readings from Last 1 Encounters:  01/24/15 5\' 11"  (1.803 m)    Weight:  Wt Readings from Last 1 Encounters:  01/24/15 125 lb 10.6 oz (57 kg)    Ideal Body Weight:  44.8 kg  Wt Readings from Last 10 Encounters:  01/24/15 125 lb 10.6 oz (57 kg)  01/19/15 126 lb 1.6 oz (57.199 kg)  12/29/14 194 lb 3.6 oz (88.1 kg)  04/14/13 198 lb (89.812 kg)  09/21/12 192 lb (87.091 kg)    BMI:  Body mass index is 17.53 kg/(m^2).  Estimated Nutritional Needs:  Kcal:  1700-1900  Protein:  65-80 grams   Fluid:  2 L/day  Skin:  Wound (see comment) (Stage 2 sacral pressure ulcer; abdominal surgical wound from ileostomy placement, R leg incision from BKA)  Diet Order:  Diet clear liquid Room service appropriate?: Yes; Fluid consistency:: Thin  EDUCATION NEEDS:  No education needs identified at this time   Intake/Output Summary (Last 24 hours) at 01/25/15 1013 Last data filed at 01/25/15 0600  Gross per 24 hour  Intake   1585 ml  Output     75 ml  Net   1510 ml    Last BM:  6/8   Jarome Matin, RD, LDN Inpatient Clinical Dietitian Pager # [phone] After hours/weekend pager # [phone]

## 2015-01-25 NOTE — Consult Note (Signed)
WOC wound consult note Reason for Consult: Nonhealing midline abdominal surgical wound. Patient having difficulty obtaining NS supplies needed.  WIll be sent home with supplies upon discharge Wound type: Surgical wound Pressure Ulcer POA: N/A Measurement: Midline:  6 cm x 3 cm x 1 cm with distal opening 2 cm x 1 cm x 0.5 cm Wound bed: thin layer of adherent slough to wound bed.  Drainage (amount, consistency, odor) Minimal serosanguinous.  Musty odor.  Periwound:Intact Dressing procedure/placement/frequency:Cleanse wound with NS and pat gently dry.  Apply NS moist gauze to wound bed.  Cover with ABD pad and secure with tape.  Will not follow at this time.  Please re-consult if needed.  Domenic Moras RN BSN [place] Pager [phone]

## 2015-01-25 NOTE — Care Management Note (Signed)
Case Management Note  Patient Details  Name: Kathryn Knapp MRN: [id number] Date of Birth: [birth year]  Subjective/Objective: 74 y/o f admitted w/abd pain.WK:MQKMM Ca.Readmit-R BKA.From home.                   Action/Plan:d/c plan home.   Expected Discharge Date:   (unknown)               Expected Discharge Plan:  Home/Self Care  In-House Referral:     Discharge planning Services  CM Consult  Post Acute Care Choice:    Choice offered to:     DME Arranged:    DME Agency:     HH Arranged:    HH Agency:     Status of Service:  In process, will continue to follow  Medicare Important Message Given:    Date Medicare IM Given:    Medicare IM give by:    Date Additional Medicare IM Given:    Additional Medicare Important Message give by:     If discussed at [place] of Stay Meetings, dates discussed:    Additional Comments:  Dessa Phi, RN 01/25/2015, 2:09 PM

## 2015-01-25 NOTE — Progress Notes (Signed)
Kathryn Knapp   DOB:Oct 04, 1940   RJ#:[id number]   TKZ#:[id number]  Subjective: She was admitted to [facility] last night, being treated for UTI. Her abdominal pain has improved with the IV pain medication. She looks better today.   Objective:  Filed Vitals:   01/25/15 1710  BP:   Pulse:   Temp:   Resp: 18    Body mass index is 17.53 kg/(m^2).  Intake/Output Summary (Last 24 hours) at 01/25/15 1853 Last data filed at 01/25/15 1841  Gross per 24 hour  Intake   2305 ml  Output    875 ml  Net   1430 ml     Sclerae unicteric  Oropharynx clear  No peripheral adenopathy  Lungs clear -- no rales or rhonchi, decreased breath sound on left   Heart regular rate and rhythm  Abdomen benign  MSK no focal spinal tenderness, no peripheral edema  Neuro nonfocal   CBG (last 3)   Recent Labs  01/25/15 0747 01/25/15 1149 01/25/15 1648  GLUCAP 81 99 113*     Labs:  Lab Results  Component Value Date   WBC 7.7 01/25/2015   HGB 9.0* 01/25/2015   HCT 30.1* 01/25/2015   MCV 87.8 01/25/2015   PLT 440* 01/25/2015   NEUTROABS 7.7 01/24/2015    '@LASTCHEMISTRY'$ @  Urine Studies No results for input(s): UHGB, CRYS in the last 72 hours.  Invalid input(s): UACOL, UAPR, USPG, UPH, UTP, UGL, UKET, UBIL, UNIT, UROB, ULEU, UEPI, UWBC, URBC, UBAC, CAST, UCOM, BILUA  Basic Metabolic Panel:  Recent Labs Lab 01/24/15 1814 01/25/15 0148  NA 138 137  K 4.0 3.6  CL 101 104  CO2 26 25  GLUCOSE 110* 95  BUN 19 15  CREATININE 0.73 0.56  CALCIUM 9.3 8.5*   GFR Estimated Creatinine Clearance: 56.4 mL/min (by C-G formula based on Cr of 0.56). Liver Function Tests:  Recent Labs Lab 01/24/15 1814 01/25/15 0148  AST 24 19  ALT 16 12*  ALKPHOS 175* 146*  BILITOT 0.5 0.5  PROT 7.6 6.3*  ALBUMIN 2.8* 2.4*   No results for input(s): LIPASE, AMYLASE in the last 168 hours. No results for input(s): AMMONIA in the last 168 hours. Coagulation profile  Recent Labs Lab 01/24/15 2300  INR  1.15    CBC:  Recent Labs Lab 01/19/15 0642 01/24/15 1814 01/25/15 0148  WBC 8.5 11.0* 7.7  NEUTROABS  --  7.7  --   HGB 8.6* 9.8* 9.0*  HCT 28.8* 32.3* 30.1*  MCV 85.7 87.5 87.8  PLT 610* 537* 440*   Cardiac Enzymes: No results for input(s): CKTOTAL, CKMB, CKMBINDEX, TROPONINI in the last 168 hours. BNP: Invalid input(s): POCBNP CBG:  Recent Labs Lab 01/19/15 0646 01/19/15 1152 01/25/15 0747 01/25/15 1149 01/25/15 1648  GLUCAP 107* 102* 81 99 113*   D-Dimer No results for input(s): DDIMER in the last 72 hours. Hgb A1c No results for input(s): HGBA1C in the last 72 hours. Lipid Profile No results for input(s): CHOL, HDL, LDLCALC, TRIG, CHOLHDL, LDLDIRECT in the last 72 hours. Thyroid function studies No results for input(s): TSH, T4TOTAL, T3FREE, THYROIDAB in the last 72 hours.  Invalid input(s): FREET3 Anemia work up No results for input(s): VITAMINB12, FOLATE, FERRITIN, TIBC, IRON, RETICCTPCT in the last 72 hours. Microbiology No results found for this or any previous visit (from the past 240 hour(s)).    Studies:  Dg Chest 2 View  01/24/2015   CLINICAL DATA:  Shortness of breath and LEFT lower flank pain.  No other complaints. Hypertension and diabetes. Duration unspecified.  EXAM: CHEST  2 VIEW  COMPARISON:  01/02/2015.  FINDINGS: Continued retrocardiac opacity with moderate-size LEFT pleural effusion. Atelectasis, and superimposed infiltrate not excluded. Normal cardiomediastinal silhouette otherwise. RIGHT lung clear. No bony abnormality.  Compared with priors, the RIGHT effusion is resolved. The LEFT effusion may be slightly improved.  IMPRESSION: Continued retrocardiac density along with moderate LEFT pleural effusion. Improved aeration on the RIGHT.  Followup PA and lateral chest X-ray is recommended in 3-4 weeks following trial of antibiotic therapy to ensure resolution and exclude underlying malignancy.   Electronically Signed   By: Rolla Flatten M.D.   On:  01/24/2015 20:28   Ct Abdomen Pelvis W Contrast  01/24/2015   CLINICAL DATA:  Diagnosed with colon cancer 1 month ago. Now with nausea and vomiting.  EXAM: CT ABDOMEN AND PELVIS WITH CONTRAST  TECHNIQUE: Multidetector CT imaging of the abdomen and pelvis was performed using the standard protocol following bolus administration of intravenous contrast.  CONTRAST:  145mL OMNIPAQUE IOHEXOL 300 MG/ML SOLN, 44mL OMNIPAQUE IOHEXOL 300 MG/ML SOLN  COMPARISON:  CT abdomen pelvis - 12/25/2014; 12/16/2014  FINDINGS: There is residual abnormal omental thickening about the left upper abdominal quadrant (representative images 14 and 18, series 2, worrisome for residual and/or locally recurrent malignancy. All exact measurements are difficult, the dominant component of this abnormal soft tissue within the left upper abdominal quadrant measures approximately 10.4 x 3.0 cm (image 19, series 2). These findings are associated with progressive retroperitoneal, porta hepatis and mesenteric adenopathy with index left-sided periaortic lymph node now measuring 1.2 cm in greatest short axis diameter (image 38, series 2), previously, 0.7 cm; index left periaortic lymph node now measuring approximately 1.3 cm in diameter (image 43, series 2), previously, 0.7 cm; index gastrohepatic ligament lymph node now measuring 1.2 cm in diameter (image 21, series 2), previously, 0.8 cm. Multiple metastatic implants are again noted about the ventral aspect of the omentum with index implant measuring approximately 1.1 cm in diameter (image 36, series 2).  Normal hepatic contour. Post cholecystectomy. Common bile duct remains mildly dilated, measuring approximately 1.1 cm in diameter with associated mild centralized intrahepatic biliary duct dilatation. Interval development of an approximately 1.3 cm hypo attenuating nodule within in the lateral segment of the left lobe of the liver adjacent to the fissure for ligamentum teres. Mid amount of focal fatty  infiltration adjacent to the fissure for ligamentum teres (image 28, series 2). No ascites.  There is symmetric enhancement and excretion of the bilateral kidneys. No definite renal stones on this postcontrast examination. No discrete renal lesions. No urine obstruction or perinephric stranding.  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 Normal appearance of the right adrenal gland, normal appearance of the right adrenal gland and pancreas. Several calcifications are again noted about the splenic hilum.  Stable sequela of hemicolectomy and end ileostomy creation within the right lower abdominal quadrant. Enteric contrast extends to the level of the distal small bowel. No evidence of enteric obstruction. No pneumoperitoneum, pneumatosis or portal venous gas. There is mild diffuse stranding throughout the abdominal mesenteric without definable/drainable fluid collection.  Moderate amount of mixed calcified and noncalcified atherosclerotic plaque within a normal caliber abdominal aorta. The major branch vessels of the abdominal aorta appear patent on this non CTA examination.  Limited visualization lower thorax demonstrates a persistent small to moderate size left-sided effusion. Resolved right-sided pleural effusion. Minimal residual  bibasilar atelectasis with partial atelectasis / collapse of the imaged left lower lobe.  Normal heart size. Trace amount of pericardial fluid, potentially physiologic.  No acute or aggressive osseous abnormalities. Mild anterolisthesis of L4 upon L5 without associated pars defects. Mild-to-moderate multilevel lumbar spine DDD, worse at L5-S1 with disc space height loss, endplate irregularity and sclerosis. A hemangioma is noted within the T11 vertebral body.  There is an apparent open midline abdominal wound (image 51, series 2) without associated hernia formation. Mild diffuse body wall anasarca, improvement  prior examination.  IMPRESSION: 1. Ill-defined soft tissue within the left upper abdominal quadrant worrisome for residual and/or locally recurrent disease with dominant residual soft tissue measuring approximately 10.4 cm in diameter. 2. Findings compatible with progression of metastatic disease with worsening retroperitoneal, porta-hepatis and mesenteric adenopathy and interval increase in size of suspected left adrenal gland metastasis. 3. Interval development of an approximately 1.3 cm liver lesion compatible with hepatic metastatic disease. 4. Post hemicolectomy and end ileostomy creation without evidence of enteric obstruction. Note of definable/drainable intra-abdominal fluid collection. 5. Persistent open midline abdominal wound without associated hernia.   Electronically Signed   By: Sandi Mariscal M.D.   On: 01/24/2015 20:13    Assessment: 74 y.o. female  1. Metastatic colon cancer, [id number], stage IV, with peritoneal and left adrenal metastasis. MSI-H, BRAF [id number] (+)  2. UTI 3. Left pleural effusion, probable malignant, questionable pneumonia 4.  Right foot ischemia, status post BKA 5. Anorexia, weight loss 6. Left abdominal pain, secondary to malignancy.   Plan:  -I reviewed her CT scan findings. Unfortunately her cancer has significantly progressed in the past few months. -Considered left thoracentesis for cytology and culture -Given her poor performance status, she will not be a candidate for cytotoxic hemotherapy. I'll try to get her insurance approval for PDL 1 inhibitor. Alternatively, hospice would be appropriate given her multiple medical comorbidities and poor performance status. Patient expressed her wishes to try treatment first.  -agree with antibiotics, follow cultures. -Nutrition consult.  Please call me if you have any questions. I'll follow her as needed when she is in the hospital. I plan to see her back next week in my clinic.  Truitt Merle, MD 01/25/2015  6:53 PM

## 2015-01-25 NOTE — Progress Notes (Signed)
OT Cancellation Note  Patient Details Name: Kathryn Knapp MRN: [id number] DOB: 20-Feb-1941   Cancelled Treatment:    Reason Eval/Treat Not Completed: Pain limiting ability to participate  Will check back on pt next day  [name] 01/25/2015, 11:19 AM

## 2015-01-25 NOTE — Progress Notes (Signed)
[facility] PROGRESS NOTE  Kathryn Knapp YBW:[id number] DOB: [birth year] DOA: 01/24/2015 PCP: No PCP Per Patient  Assessment/Plan: UTI and sepsis:  - Continue current broad spectrum biotic regimen and follow-up with urine culture. - Follow-up with blood culture  Cough: Chest x-ray showed retrocardiac density with left moderate pleural effusion. Her effusion is likely due to metastasized disease. The retrocardial density is concerning for pneumonia -Patient however denies any increase in productive sputum - Suspect infection etiology most likely secondary to UTI - Urine Legionella antigen negative strep pneumonia antigen negative  Metastasized colon cancer: She has been followed up and treated by Dr. Krista Blue. Last seen was today. Per Dr. Rhea Belton note, she is [phone], stage IV, with peritoneal and left adrenal metastasis. She has sporadic Lynch syndrome.Not curable, and overall prognosis is very poor. Not a good candidate for chemotherapy. PDL 1 inhibitor was considered, unfortunately, PDL 1 inhibitor has not been approved by FDA. Dr Krista Blue plans to touch base with her insurance company to see if they would approve PDL 1 inhibitor pembrolizumab. -Symptomatic treatment for pain and nausea, we'll plan on placing back on home pain medication regimen -follow up with Dr. Krista Blue  Hx of right leg ischemia: s/p of R BKA. -on Lovenx  Diet controled DM-II: Last A1c 6.3 on 12/24/14  -relatively well controlled on SSI - diabetic diet  GERD: - stable, continue Protonix  Depression: Stable, no suicidal or homicidal ideations. - Stable, Continue Prozac  Code Status: Full Family Communication: Discussed with patient and daughter at bedside Disposition Plan: Pending improvement in condition   Consultants:  none  Procedures:  None  Antibiotics:  Zosyn and Vancomycin  HPI/Subjective: Patient states her pain is well controlled and she feels better.  Objective: Filed Vitals:   01/25/15 1330   BP: 128/63  Pulse: 72  Temp: 98.7 F (37.1 C)  Resp: 18    Intake/Output Summary (Last 24 hours) at 01/25/15 1554 Last data filed at 01/25/15 1500  Gross per 24 hour  Intake   2065 ml  Output    275 ml  Net   1790 ml   Filed Weights   01/24/15 1814 01/24/15 2217  Weight: 57.153 kg (126 lb) 57 kg (125 lb 10.6 oz)    Exam:   General:  Patient in no acute distress, alert and awake  Cardiovascular: Regular rate and rhythm, no murmurs or rubs  Respiratory: Clear to auscultation bilaterally, decreased breath sounds over left base  Abdomen: Soft, nondistended, nontender  Musculoskeletal: No cyanosis or clubbing  Data Reviewed: Basic Metabolic Panel:  Recent Labs Lab 01/24/15 1814 01/25/15 0148  NA 138 137  K 4.0 3.6  CL 101 104  CO2 26 25  GLUCOSE 110* 95  BUN 19 15  CREATININE 0.73 0.56  CALCIUM 9.3 8.5*   Liver Function Tests:  Recent Labs Lab 01/24/15 1814 01/25/15 0148  AST 24 19  ALT 16 12*  ALKPHOS 175* 146*  BILITOT 0.5 0.5  PROT 7.6 6.3*  ALBUMIN 2.8* 2.4*   No results for input(s): LIPASE, AMYLASE in the last 168 hours. No results for input(s): AMMONIA in the last 168 hours. CBC:  Recent Labs Lab 01/19/15 0642 01/24/15 1814 01/25/15 0148  WBC 8.5 11.0* 7.7  NEUTROABS  --  7.7  --   HGB 8.6* 9.8* 9.0*  HCT 28.8* 32.3* 30.1*  MCV 85.7 87.5 87.8  PLT 610* 537* 440*   Cardiac Enzymes: No results for input(s): CKTOTAL, CKMB, CKMBINDEX, TROPONINI in the last 168 hours.  BNP (last 3 results) No results for input(s): BNP in the last 8760 hours.  ProBNP (last 3 results) No results for input(s): PROBNP in the last 8760 hours.  CBG:  Recent Labs Lab 01/18/15 2043 01/19/15 0646 01/19/15 1152 01/25/15 0747 01/25/15 1149  GLUCAP 102* 107* 102* 81 99    No results found for this or any previous visit (from the past 240 hour(s)).   Studies: Dg Chest 2 View  01/24/2015   CLINICAL DATA:  Shortness of breath and LEFT lower flank  pain. No other complaints. Hypertension and diabetes. Duration unspecified.  EXAM: CHEST  2 VIEW  COMPARISON:  01/02/2015.  FINDINGS: Continued retrocardiac opacity with moderate-size LEFT pleural effusion. Atelectasis, and superimposed infiltrate not excluded. Normal cardiomediastinal silhouette otherwise. RIGHT lung clear. No bony abnormality.  Compared with priors, the RIGHT effusion is resolved. The LEFT effusion may be slightly improved.  IMPRESSION: Continued retrocardiac density along with moderate LEFT pleural effusion. Improved aeration on the RIGHT.  Followup PA and lateral chest X-ray is recommended in 3-4 weeks following trial of antibiotic therapy to ensure resolution and exclude underlying malignancy.   Electronically Signed   By: Rolla Flatten M.D.   On: 01/24/2015 20:28   Ct Abdomen Pelvis W Contrast  01/24/2015   CLINICAL DATA:  Diagnosed with colon cancer 1 month ago. Now with nausea and vomiting.  EXAM: CT ABDOMEN AND PELVIS WITH CONTRAST  TECHNIQUE: Multidetector CT imaging of the abdomen and pelvis was performed using the standard protocol following bolus administration of intravenous contrast.  CONTRAST:  130mL OMNIPAQUE IOHEXOL 300 MG/ML SOLN, 28mL OMNIPAQUE IOHEXOL 300 MG/ML SOLN  COMPARISON:  CT abdomen pelvis - 12/25/2014; 12/16/2014  FINDINGS: There is residual abnormal omental thickening about the left upper abdominal quadrant (representative images 14 and 18, series 2, worrisome for residual and/or locally recurrent malignancy. All exact measurements are difficult, the dominant component of this abnormal soft tissue within the left upper abdominal quadrant measures approximately 10.4 x 3.0 cm (image 19, series 2). These findings are associated with progressive retroperitoneal, porta hepatis and mesenteric adenopathy with index left-sided periaortic lymph node now measuring 1.2 cm in greatest short axis diameter (image 38, series 2), previously, 0.7 cm; index left periaortic lymph node  now measuring approximately 1.3 cm in diameter (image 43, series 2), previously, 0.7 cm; index gastrohepatic ligament lymph node now measuring 1.2 cm in diameter (image 21, series 2), previously, 0.8 cm. Multiple metastatic implants are again noted about the ventral aspect of the omentum with index implant measuring approximately 1.1 cm in diameter (image 36, series 2).  Normal hepatic contour. Post cholecystectomy. Common bile duct remains mildly dilated, measuring approximately 1.1 cm in diameter with associated mild centralized intrahepatic biliary duct dilatation. Interval development of an approximately 1.3 cm hypo attenuating nodule within in the lateral segment of the left lobe of the liver adjacent to the fissure for ligamentum teres. Mid amount of focal fatty infiltration adjacent to the fissure for ligamentum teres (image 28, series 2). No ascites.  There is symmetric enhancement and excretion of the bilateral kidneys. No definite renal stones on this postcontrast examination. No discrete renal lesions. No urine obstruction or perinephric stranding.  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 Normal appearance of the right adrenal gland, normal appearance of the right adrenal gland and pancreas. Several calcifications are again noted about the splenic hilum.  Stable sequela of hemicolectomy and end ileostomy creation within the  right lower abdominal quadrant. Enteric contrast extends to the level of the distal small bowel. No evidence of enteric obstruction. No pneumoperitoneum, pneumatosis or portal venous gas. There is mild diffuse stranding throughout the abdominal mesenteric without definable/drainable fluid collection.  Moderate amount of mixed calcified and noncalcified atherosclerotic plaque within a normal caliber abdominal aorta. The major branch vessels of the abdominal aorta appear patent on this non CTA examination.   Limited visualization lower thorax demonstrates a persistent small to moderate size left-sided effusion. Resolved right-sided pleural effusion. Minimal residual bibasilar atelectasis with partial atelectasis / collapse of the imaged left lower lobe.  Normal heart size. Trace amount of pericardial fluid, potentially physiologic.  No acute or aggressive osseous abnormalities. Mild anterolisthesis of L4 upon L5 without associated pars defects. Mild-to-moderate multilevel lumbar spine DDD, worse at L5-S1 with disc space height loss, endplate irregularity and sclerosis. A hemangioma is noted within the T11 vertebral body.  There is an apparent open midline abdominal wound (image 51, series 2) without associated hernia formation. Mild diffuse body wall anasarca, improvement prior examination.  IMPRESSION: 1. Ill-defined soft tissue within the left upper abdominal quadrant worrisome for residual and/or locally recurrent disease with dominant residual soft tissue measuring approximately 10.4 cm in diameter. 2. Findings compatible with progression of metastatic disease with worsening retroperitoneal, porta-hepatis and mesenteric adenopathy and interval increase in size of suspected left adrenal gland metastasis. 3. Interval development of an approximately 1.3 cm liver lesion compatible with hepatic metastatic disease. 4. Post hemicolectomy and end ileostomy creation without evidence of enteric obstruction. Note of definable/drainable intra-abdominal fluid collection. 5. Persistent open midline abdominal wound without associated hernia.   Electronically Signed   By: Sandi Mariscal M.D.   On: 01/24/2015 20:13    Scheduled Meds: . antiseptic oral rinse  7 mL Mouth Rinse q12n4p  . chlorhexidine  15 mL Mouth Rinse BID  . dextromethorphan-guaiFENesin  1 tablet Oral BID  . docusate sodium  100 mg Oral Daily  . enoxaparin  90 mg Subcutaneous Q24H  . feeding supplement (ENSURE ENLIVE)  237 mL Oral BID BM  . feeding supplement  (RESOURCE BREEZE)  1 Container Oral BID BM  . FLUoxetine  20 mg Oral Daily  . gabapentin  100 mg Oral TID  . insulin aspart  0-9 Units Subcutaneous TID WC  . pantoprazole  40 mg Oral Daily  . piperacillin-tazobactam (ZOSYN)  IV  3.375 g Intravenous Q8H  . sodium chloride  3 mL Intravenous Q12H  . vancomycin  500 mg Intravenous Q12H   Continuous Infusions: . sodium chloride 100 mL/hr at 01/24/15 2144    Principal Problem:   Abdominal pain Active Problems:   S/P partial colectomy   Thrombus   Colon cancer metastasized to multiple sites   Depression   Palliative care encounter   Amputation of right lower extremity below knee   Cough   Diabetes mellitus without complication   UTI (lower urinary tract infection)   GERD (gastroesophageal reflux disease)   Protein-calorie malnutrition, severe   Time spent: > 35 minutes   Velvet Bathe  [facility] Pager [phone]. If 7PM-7AM, please contact night-coverage at [email or web address], password [email or web address] 01/25/2015, 3:54 PM  LOS: 1 day

## 2015-01-25 NOTE — [facility] Oncology Navigator Note (Signed)
Oncology Nurse Navigator Documentation  Oncology Nurse Navigator Flowsheets 01/25/2015  Referral date to RadOnc/MedOnc 01/05/2015  Navigator Encounter Type Hospital Follow UP  Patient Visit Type Inpatient  Treatment Phase Acute care   Barriers/Navigation Needs Financial;Family concerns-no prescription insurance, limited income-can't afford supplements or wound supplies.  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 Will follow to ensure home health is established and her supplies are in home for discharge.  Time Spent with Patient 15  Reports she is still in a lot of pain and has PCA, but is afraid she will give herself too much. Informed her it is programed to only deliver a set amount in specific time period and if she pushes the button and it is too early, the machine won't deliver the dose.

## 2015-01-26 ENCOUNTER — Telehealth: Payer: Self-pay | Admitting: *Deleted

## 2015-01-26 ENCOUNTER — Encounter: Payer: Self-pay | Admitting: Vascular Surgery

## 2015-01-26 DIAGNOSIS — R109 Unspecified abdominal pain: Secondary | ICD-10-CM

## 2015-01-26 LAB — GLUCOSE, CAPILLARY
GLUCOSE-CAPILLARY: 84 mg/dL (ref 65–99)
GLUCOSE-CAPILLARY: 88 mg/dL (ref 65–99)
Glucose-Capillary: 102 mg/dL — ABNORMAL HIGH (ref 65–99)
Glucose-Capillary: 96 mg/dL (ref 65–99)
Glucose-Capillary: 93 mg/dL (ref 65–99)

## 2015-01-26 MED ORDER — LEVOFLOXACIN 500 MG PO TABS
500.0000 mg | ORAL_TABLET | Freq: Every day | ORAL | Status: DC
  Administered 2015-01-26 – 2015-01-27 (×2): 500 mg via ORAL
  Filled 2015-01-26 (×3): qty 1

## 2015-01-26 NOTE — Progress Notes (Signed)
ANTIBIOTIC CONSULT NOTE - INITIAL  Pharmacy Consult for po levaquin Indication: UTI  No Known Allergies  Patient Measurements: Height: 5\' 11"  (180.3 cm) Weight: 125 lb 10.6 oz (57 kg) IBW/kg (Calculated) : 70.8  Vital Signs: Temp: 98.1 F (36.7 C) (06/10 0520) Temp Source: Oral (06/10 0520) BP: 122/53 mmHg (06/10 0520) Pulse Rate: 75 (06/10 0520) Intake/Output from previous day: 06/09 0701 - 06/10 0700 In: 2505 [P.O.:720; I.V.:2400; IV Piggyback:300] Out: 950 [Urine:450; Stool:500] Intake/Output from this shift:    Labs:  Recent Labs  01/24/15 1814 01/25/15 0148  WBC 11.0* 7.7  HGB 9.8* 9.0*  PLT 537* 440*  CREATININE 0.73 0.56   Estimated Creatinine Clearance: 56.4 mL/min (by C-G formula based on Cr of 0.56). No results for input(s): VANCOTROUGH, VANCOPEAK, VANCORANDOM, GENTTROUGH, GENTPEAK, GENTRANDOM, TOBRATROUGH, TOBRAPEAK, TOBRARND, AMIKACINPEAK, AMIKACINTROU, AMIKACIN in the last 72 hours.   Microbiology: Recent Results (from the past 720 hour(s))  Urine culture     Status: None   Collection Time: 01/24/15  5:34 PM  Result Value Ref Range Status   Specimen Description URINE, CLEAN CATCH  Final   Special Requests NONE  Final   Colony Count   Final    8,000 COLONIES/ML Performed at [facility]    Culture   Final    INSIGNIFICANT GROWTH Performed at [facility]    Report Status 01/25/2015 FINAL  Final  Culture, blood (routine x 2) Call MD if unable to obtain prior to antibiotics being given     Status: None (Preliminary result)   Collection Time: 01/24/15 11:00 PM  Result Value Ref Range Status   Specimen Description BLOOD LEFT HAND  Final   Special Requests AEROBIC BOTTLE ONLY  Final   Culture   Final           BLOOD CULTURE RECEIVED NO GROWTH TO DATE CULTURE WILL BE HELD FOR 5 DAYS BEFORE ISSUING A FINAL NEGATIVE REPORT Performed at [facility]    Report Status PENDING  Incomplete  Culture, blood (routine x 2) Call MD if  unable to obtain prior to antibiotics being given     Status: None (Preliminary result)   Collection Time: 01/24/15 11:00 PM  Result Value Ref Range Status   Specimen Description LEFT ANTECUBITAL  Final   Special Requests AEROBIC BOTTLE ONLY  Final   Culture   Final           BLOOD CULTURE RECEIVED NO GROWTH TO DATE CULTURE WILL BE HELD FOR 5 DAYS BEFORE ISSUING A FINAL NEGATIVE REPORT Performed at [facility]    Report Status PENDING  Incomplete    Medical History: Past Medical History  Diagnosis Date  . Diabetes mellitus without complication   . Hypertension   . GERD (gastroesophageal reflux disease)   . Depression   . 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 She was admitted to [facility] on 6/8 for further workup of worsening of left abdominal pain and originally started on vanc and ceftazidime for PNA.  Pharmacy now consulted to dose po levaquin for UTI.  Scr 0.56, CrCl~52mls/min  Goal of Therapy:  levaquin per renal function  Plan:  levaquin 500mg  po once daily  Follow renal function  Dolly Rias RPh 01/26/2015, 1:38 PM Pager [phone]

## 2015-01-26 NOTE — Care Management Note (Signed)
Case Management Note  Patient Details  Name: Kathryn Knapp MRN: [id number] Date of Birth: 30-Jan-1941  Subjective/Objective:  Patient has medicare part A.Patient states she doesn't have script coverage(will check benefits), provided her with pcp listing for [place],also informed of health dept in [place] if unable to get a pcp.Informed that for [facility] agency a pcp is required.Recommend HHRN/PT/OT/social Insurance underwriter.TC [facility] for [facility] services, spoke to rep Lelan Pons aware of referral, & HHC orders.  May need MATCH program if does not have script coverage.                Action/Plan:d/c plan home w/HHC.   Expected Discharge Date:   (unknown)               Expected Discharge Plan:  [name]  In-House Referral:     Discharge planning Services  CM Consult  Post Acute Care Choice:    Choice offered to:  Patient  DME Arranged:    DME Agency:     HH Arranged:  RN, OT, PT ([place] social worker) [place]:  [place]  Status of Service:  In process, will continue to follow  Medicare Important Message Given:    Date Medicare IM Given:    Medicare IM give by:    Date Additional Medicare IM Given:    Additional Medicare Important Message give by:     If discussed at [place] of Stay Meetings, dates discussed:    Additional Comments:  Dessa Phi, RN 01/26/2015, 1:28 PM

## 2015-01-26 NOTE — Progress Notes (Signed)
[facility] PROGRESS NOTE  Kathryn Knapp XBM:[id number] DOB: 03/13/1941 DOA: 01/24/2015 PCP: No PCP Per Patient  Assessment/Plan: UTI and sepsis:  - Will transition to oral Levaquin - Follow-up with blood culture - Urine culture reported insignificant growth the patient improving on antibiotics.  Cough: Chest x-ray showed retrocardiac density with left moderate pleural effusion. Her effusion is likely due to metastasized disease. The retrocardial density is concerning for pneumonia -Patient however denies any increase in productive sputum - Suspect infection etiology most likely secondary to UTI - Urine Legionella antigen negative strep pneumonia antigen negative  Metastasized colon cancer: She has been followed up and treated by Dr. Krista Knapp. Last seen was today. Per Dr. Rhea Knapp note, she is [phone], stage IV, with peritoneal and left adrenal metastasis. She has sporadic Lynch syndrome.Not curable, and overall prognosis is very poor. Not a good candidate for chemotherapy. PDL 1 inhibitor was considered, unfortunately, PDL 1 inhibitor has not been approved by FDA. Dr Kathryn Knapp plans to touch base with her insurance company to see if they would approve PDL 1 inhibitor pembrolizumab. -Symptomatic treatment for pain and nausea, we'll plan on placing back on home pain medication regimen -follow up with Dr. Krista Knapp  Hx of right leg ischemia: s/p of R BKA. -on Lovenx  Diet controled DM-II: Last A1c 6.3 on 12/24/14  -relatively well controlled on SSI - diabetic diet  GERD: - stable, continue Protonix  Depression: Stable, no suicidal or homicidal ideations. - Stable, Continue Prozac  Code Status: Full Family Communication: Discussed with patient and daughter at bedside Disposition Plan: Pending improvement in condition   Consultants:  none  Procedures:  None  Antibiotics:  Zosyn and Vancomycin  HPI/Subjective: Patient states continued improvement in condition. No acute issues  overnight  Objective: Filed Vitals:   01/26/15 1300  BP: 111/50  Pulse: 66  Temp: 97.7 F (36.5 C)  Resp: 16    Intake/Output Summary (Last 24 hours) at 01/26/15 1608 Last data filed at 01/26/15 1500  Gross per 24 hour  Intake   4320 ml  Output    975 ml  Net   3345 ml   Filed Weights   01/24/15 1814 01/24/15 2217  Weight: 57.153 kg (126 lb) 57 kg (125 lb 10.6 oz)    Exam:   General:  Patient in no acute distress, alert and awake  Cardiovascular: Regular rate and rhythm, no murmurs or rubs  Respiratory: Clear to auscultation bilaterally, decreased breath sounds over left base  Abdomen: Soft, nondistended, nontender  Musculoskeletal: No cyanosis or clubbing  Data Reviewed: Basic Metabolic Panel:  Recent Labs Lab 01/24/15 1814 01/25/15 0148  NA 138 137  K 4.0 3.6  CL 101 104  CO2 26 25  GLUCOSE 110* 95  BUN 19 15  CREATININE 0.73 0.56  CALCIUM 9.3 8.5*   Liver Function Tests:  Recent Labs Lab 01/24/15 1814 01/25/15 0148  AST 24 19  ALT 16 12*  ALKPHOS 175* 146*  BILITOT 0.5 0.5  PROT 7.6 6.3*  ALBUMIN 2.8* 2.4*   No results for input(s): LIPASE, AMYLASE in the last 168 hours. No results for input(s): AMMONIA in the last 168 hours. CBC:  Recent Labs Lab 01/24/15 1814 01/25/15 0148  WBC 11.0* 7.7  NEUTROABS 7.7  --   HGB 9.8* 9.0*  HCT 32.3* 30.1*  MCV 87.5 87.8  PLT 537* 440*   Cardiac Enzymes: No results for input(s): CKTOTAL, CKMB, CKMBINDEX, TROPONINI in the last 168 hours. BNP (last 3 results) No results  for input(s): BNP in the last 8760 hours.  ProBNP (last 3 results) No results for input(s): PROBNP in the last 8760 hours.  CBG:  Recent Labs Lab 01/25/15 1149 01/25/15 1648 01/25/15 2243 01/26/15 0727 01/26/15 1144  GLUCAP 99 113* 102* 84 96    Recent Results (from the past 240 hour(s))  Urine culture     Status: None   Collection Time: 01/24/15  5:34 PM  Result Value Ref Range Status   Specimen Description  URINE, CLEAN CATCH  Final   Special Requests NONE  Final   Colony Count   Final    8,000 COLONIES/ML Performed at [facility]    Culture   Final    INSIGNIFICANT GROWTH Performed at [facility]    Report Status 01/25/2015 FINAL  Final  Culture, blood (routine x 2) Call MD if unable to obtain prior to antibiotics being given     Status: None (Preliminary result)   Collection Time: 01/24/15 11:00 PM  Result Value Ref Range Status   Specimen Description BLOOD LEFT HAND  Final   Special Requests AEROBIC BOTTLE ONLY  Final   Culture   Final           BLOOD CULTURE RECEIVED NO GROWTH TO DATE CULTURE WILL BE HELD FOR 5 DAYS BEFORE ISSUING A FINAL NEGATIVE REPORT Performed at [facility]    Report Status PENDING  Incomplete  Culture, blood (routine x 2) Call MD if unable to obtain prior to antibiotics being given     Status: None (Preliminary result)   Collection Time: 01/24/15 11:00 PM  Result Value Ref Range Status   Specimen Description LEFT ANTECUBITAL  Final   Special Requests AEROBIC BOTTLE ONLY  Final   Culture   Final           BLOOD CULTURE RECEIVED NO GROWTH TO DATE CULTURE WILL BE HELD FOR 5 DAYS BEFORE ISSUING A FINAL NEGATIVE REPORT Performed at [facility]    Report Status PENDING  Incomplete     Studies: Dg Chest 2 View  01/24/2015   CLINICAL DATA:  Shortness of breath and LEFT lower flank pain. No other complaints. Hypertension and diabetes. Duration unspecified.  EXAM: CHEST  2 VIEW  COMPARISON:  01/02/2015.  FINDINGS: Continued retrocardiac opacity with moderate-size LEFT pleural effusion. Atelectasis, and superimposed infiltrate not excluded. Normal cardiomediastinal silhouette otherwise. RIGHT lung clear. No bony abnormality.  Compared with priors, the RIGHT effusion is resolved. The LEFT effusion may be slightly improved.  IMPRESSION: Continued retrocardiac density along with moderate LEFT pleural effusion. Improved aeration on the  RIGHT.  Followup PA and lateral chest X-ray is recommended in 3-4 weeks following trial of antibiotic therapy to ensure resolution and exclude underlying malignancy.   Electronically Signed   By: Rolla Flatten M.D.   On: 01/24/2015 20:28   Ct Abdomen Pelvis W Contrast  01/24/2015   CLINICAL DATA:  Diagnosed with colon cancer 1 month ago. Now with nausea and vomiting.  EXAM: CT ABDOMEN AND PELVIS WITH CONTRAST  TECHNIQUE: Multidetector CT imaging of the abdomen and pelvis was performed using the standard protocol following bolus administration of intravenous contrast.  CONTRAST:  171mL OMNIPAQUE IOHEXOL 300 MG/ML SOLN, 49mL OMNIPAQUE IOHEXOL 300 MG/ML SOLN  COMPARISON:  CT abdomen pelvis - 12/25/2014; 12/16/2014  FINDINGS: There is residual abnormal omental thickening about the left upper abdominal quadrant (representative images 14 and 18, series 2, worrisome for residual and/or locally recurrent malignancy. All exact measurements  are difficult, the dominant component of this abnormal soft tissue within the left upper abdominal quadrant measures approximately 10.4 x 3.0 cm (image 19, series 2). These findings are associated with progressive retroperitoneal, porta hepatis and mesenteric adenopathy with index left-sided periaortic lymph node now measuring 1.2 cm in greatest short axis diameter (image 38, series 2), previously, 0.7 cm; index left periaortic lymph node now measuring approximately 1.3 cm in diameter (image 43, series 2), previously, 0.7 cm; index gastrohepatic ligament lymph node now measuring 1.2 cm in diameter (image 21, series 2), previously, 0.8 cm. Multiple metastatic implants are again noted about the ventral aspect of the omentum with index implant measuring approximately 1.1 cm in diameter (image 36, series 2).  Normal hepatic contour. Post cholecystectomy. Common bile duct remains mildly dilated, measuring approximately 1.1 cm in diameter with associated mild centralized intrahepatic biliary  duct dilatation. Interval development of an approximately 1.3 cm hypo attenuating nodule within in the lateral segment of the left lobe of the liver adjacent to the fissure for ligamentum teres. Mid amount of focal fatty infiltration adjacent to the fissure for ligamentum teres (image 28, series 2). No ascites.  There is symmetric enhancement and excretion of the bilateral kidneys. No definite renal stones on this postcontrast examination. No discrete renal lesions. No urine obstruction or perinephric stranding.  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 Normal appearance of the right adrenal gland, normal appearance of the right adrenal gland and pancreas. Several calcifications are again noted about the splenic hilum.  Stable sequela of hemicolectomy and end ileostomy creation within the right lower abdominal quadrant. Enteric contrast extends to the level of the distal small bowel. No evidence of enteric obstruction. No pneumoperitoneum, pneumatosis or portal venous gas. There is mild diffuse stranding throughout the abdominal mesenteric without definable/drainable fluid collection.  Moderate amount of mixed calcified and noncalcified atherosclerotic plaque within a normal caliber abdominal aorta. The major branch vessels of the abdominal aorta appear patent on this non CTA examination.  Limited visualization lower thorax demonstrates a persistent small to moderate size left-sided effusion. Resolved right-sided pleural effusion. Minimal residual bibasilar atelectasis with partial atelectasis / collapse of the imaged left lower lobe.  Normal heart size. Trace amount of pericardial fluid, potentially physiologic.  No acute or aggressive osseous abnormalities. Mild anterolisthesis of L4 upon L5 without associated pars defects. Mild-to-moderate multilevel lumbar spine DDD, worse at L5-S1 with disc space height loss, endplate irregularity  and sclerosis. A hemangioma is noted within the T11 vertebral body.  There is an apparent open midline abdominal wound (image 51, series 2) without associated hernia formation. Mild diffuse body wall anasarca, improvement prior examination.  IMPRESSION: 1. Ill-defined soft tissue within the left upper abdominal quadrant worrisome for residual and/or locally recurrent disease with dominant residual soft tissue measuring approximately 10.4 cm in diameter. 2. Findings compatible with progression of metastatic disease with worsening retroperitoneal, porta-hepatis and mesenteric adenopathy and interval increase in size of suspected left adrenal gland metastasis. 3. Interval development of an approximately 1.3 cm liver lesion compatible with hepatic metastatic disease. 4. Post hemicolectomy and end ileostomy creation without evidence of enteric obstruction. Note of definable/drainable intra-abdominal fluid collection. 5. Persistent open midline abdominal wound without associated hernia.   Electronically Signed   By: Sandi Mariscal M.D.   On: 01/24/2015 20:13    Scheduled Meds: . antiseptic oral rinse  7 mL Mouth Rinse q12n4p  . chlorhexidine  15 mL  Mouth Rinse BID  . dextromethorphan-guaiFENesin  1 tablet Oral BID  . docusate sodium  100 mg Oral Daily  . enoxaparin  90 mg Subcutaneous Q24H  . feeding supplement (ENSURE ENLIVE)  237 mL Oral BID BM  . feeding supplement (RESOURCE BREEZE)  1 Container Oral BID BM  . FLUoxetine  20 mg Oral Daily  . gabapentin  100 mg Oral TID  . insulin aspart  0-9 Units Subcutaneous TID WC  . levofloxacin  500 mg Oral Daily  . pantoprazole  40 mg Oral Daily  . sodium chloride  3 mL Intravenous Q12H   Continuous Infusions: . 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 If 7PM-7AM, please contact night-coverage at [email or web address], password [facility] 01/26/2015, 4:08 PM  LOS: 2 days

## 2015-01-26 NOTE — Evaluation (Signed)
Physical Therapy Evaluation Patient Details Name: Kathryn Knapp MRN: [id number] DOB: 1941-04-23 Today's Date: 01/26/2015   History of Present Illness  Pt is a 74 year old female admitted w/ abdominal pain, being treated for UTI, sepsis, metastatic colon cancer.  CL:EXNTZ cancer, recent R BKA   Clinical Impression  Pt admitted with above diagnosis. Pt currently with functional limitations due to the deficits listed below (see PT Problem List).  Pt will benefit from skilled PT to increase their independence and safety with mobility to allow discharge to the venue listed below.   Pt recently d/c home from [place] and had not yet started HHPT.  Daughter present on evaluation and reports assisting pt with transfers at home.  Both daughter and pt feel comfortable with pt returning home.  Pt assisted with squat pivot from bed to recliner.  Also reminded pt about allowing R knee to remain straight, and verbally reviewed LE exercises.  Pt reports she is unable to tolerate sidelying and prone positions at this time however encouraged supine with no HOB elevation for 10 min a few times/day to maintain hip mobility (prevent lack of extension).     Follow Up Recommendations Home health PT;Supervision/Assistance - 24 hour    Equipment Recommendations  None recommended by PT    Recommendations for Other Services       Precautions / Restrictions Precautions Precautions: Fall Restrictions Weight Bearing Restrictions: Yes RLE Weight Bearing: Non weight bearing      Mobility  Bed Mobility Overal bed mobility: Needs Assistance Bed Mobility: Supine to Sit     Supine to sit: Supervision;HOB elevated        Transfers Overall transfer level: Needs assistance Equipment used: None Transfers: Squat Pivot Transfers     Squat pivot transfers: Mod assist     General transfer comment: PT assisted with squat pivot transfer to recliner, daughter reports assisting pt at home, pt typically  transfers to left side  Ambulation/Gait Ambulation/Gait assistance:  (nonambulatory at this time)              Financial trader [employer] (Stroke Patients Only)       Balance                                             Pertinent Vitals/Pain Pain Assessment: 0-10 Pain Score: 5  Pain Location: abdomen Pain Descriptors / Indicators: Constant Pain Intervention(s): Limited activity within patient's tolerance;Monitored during session;Repositioned    Home Living Family/patient expects to be discharged to:: Private residence Living Arrangements: Children Available Help at Discharge: Family;Available 24 hours/day Type of Home: Mobile home Home Access: Stairs to enter;Ramped entrance Entrance Stairs-Rails: Can reach both Entrance Stairs-Number of Steps: 4 Home Layout: One level Home Equipment: Bedside commode;Other (comment);Walker - 2 wheels;Tub bench;Wheelchair - manual ([employer])      Prior Function Level of Independence: Needs assistance (prior to 4 weeks ago)   Gait / Transfers Assistance Needed: prior to recent admission and stay at [facility], pt was independent however just prior to this admission, pt w/c dependent and requiring assist for squat pivot and sliding board transfers (typically daughter assists)  ADL's / Homemaking Assistance Needed: reports bathing from basin, uses [place] for toileting        Hand Dominance   Dominant Hand:  Right    Extremity/Trunk Assessment               Lower Extremity Assessment: Generalized weakness;RLE deficits/detail;LLE deficits/detail RLE Deficits / Details: recent R BKA, good knee extension LLE Deficits / Details: pt reports L LE too weak to hold body weight in standing so has not yet been performing stand pivots with RW     Communication   Communication: No difficulties  Cognition Arousal/Alertness: Awake/alert Behavior During Therapy: WFL for tasks  assessed/performed Overall Cognitive Status: Within Functional Limits for tasks assessed                      General Comments      Exercises        Assessment/Plan    PT Assessment Patient needs continued PT services  PT Diagnosis Generalized weakness   PT Problem List Decreased strength;Decreased activity tolerance;Decreased mobility;Pain  PT Treatment Interventions DME instruction;Gait training;Functional mobility training;Patient/family education;Therapeutic exercise;Therapeutic activities;Wheelchair mobility training   PT Goals (Current goals can be found in the Care Plan section) Acute Rehab PT Goals PT Goal Formulation: With patient Time For Goal Achievement: 02/02/15 Potential to Achieve Goals: Good    Frequency Min 3X/week   Barriers to discharge        Co-evaluation               End of Session Equipment Utilized During Treatment: Gait belt Activity Tolerance: Patient tolerated treatment well Patient left: in chair;with call bell/phone within reach;with family/visitor present Nurse Communication: Mobility status         Time: 1350-1402 PT Time Calculation (min) (ACUTE ONLY): 12 min   Charges:   PT Evaluation $Initial PT Evaluation Tier I: 1 Procedure     PT G Codes:        Kathryn Knapp,Kathryn Knapp 01/26/2015, 3:28 PM Kathryn Knapp, PT, DPT 01/26/2015 Pager: [phone]

## 2015-01-26 NOTE — Telephone Encounter (Signed)
Received call from daughter, Mayra Reel asking for Dr Burr Medico to call her @ [phone].  Message left for Dr Burr Medico.

## 2015-01-26 NOTE — Care Management Note (Signed)
Case Management Note  Patient Details  Name: Kathryn Knapp MRN: [id number] Date of Birth: 03-23-1941  Subjective/Objective: No script coverage.May need MATCH program.[facility] already following.                   Action/Plan:d/c home w/HHC.   Expected Discharge Date:   (unknown)               Expected Discharge Plan:  [place]  In-House Referral:     Discharge planning Services  CM Consult, [place] Program (No medicare part D coverage.)  Post Acute Care Choice:    Choice offered to:  Patient  DME Arranged:    DME Agency:     HH Arranged:  RN, OT, PT ([place] social worker) [place]:  [place]  Status of Service:  In process, will continue to follow  Medicare Important Message Given:    Date Medicare IM Given:    Medicare IM give by:    Date Additional Medicare IM Given:    Additional Medicare Important Message give by:     If discussed at [place] of Stay Meetings, dates discussed:    Additional Comments:  Dessa Phi, RN 01/26/2015, 3:51 PM

## 2015-01-26 NOTE — Progress Notes (Addendum)
Occupational Therapy Evaluation Patient Details Name: Kathryn Knapp MRN: [id number] DOB: 24-Oct-1940 Today's Date: 01/26/2015    History of Present Illness 74 y/o f admitted w/abd pain.CV:ELFYB Ca.Readmit-R BKA.From home.    Clinical Impression   Patient presenting with decreased activity tolerance/endurance and decreased independence secondary to increased abdominal pain. Pt completed CIR ~2 weeks ago and was home with [facility] services. Patient independent prior to original hospitalization ~4 weeks ago. Patient currently requires overall min>mod assist from bed level and for squat pivot transfers. Patient will benefit from acute OT to increase overall independence in the areas of ADLs, functional mobility, pain management, and overall safety in order to safely discharge home with HHOT and 24/7 supervision/assistance.   Recommending Speech Consult secondary to patient with new complaints of swallowing difficulties.     Follow Up Recommendations  Home health OT;Supervision/Assistance - 24 hour    Equipment Recommendations  None recommended by OT    Recommendations for Other Services Speech consult;Other (comment) (palliative care consult?)   Precautions / Restrictions Precautions Precautions: Fall Precaution Comments: NWB R LE Restrictions Weight Bearing Restrictions: Yes RLE Weight Bearing: Non weight bearing     Mobility Bed Mobility Overal bed mobility: Needs Assistance Bed Mobility: Supine to Sit;Sit to Supine     Supine to sit: Supervision;HOB elevated Sit to supine: Supervision;HOB elevated   General bed mobility comments: minimal use of bed rails, supervision for safety; daughter okay to assist/supervise  Transfers General transfer comment: Did not occur this session secondary to pain    Balance Overall balance assessment: Needs assistance Sitting-balance support: No upper extremity supported;Single extremity supported Sitting balance-Leahy Scale:  Good Sitting balance - Comments: encouraged pt to sit EOB for self-feeding each meal    ADL Overall ADL's : Needs assistance/impaired General ADL Comments: Patient requires up to min assist for ADLs and min>mod assist for functional transfers (per her and family report) at this time. Prior to this admission, pt was performing squat pivot transfers onto [facility] and into showers with daughters assistance. Daughter is independent to assist pt with transfers. Pt sat EOB for education on compression wrapping > right residual limb. Pt requires mod verbal cues and mod assist to complete this task. Encouraged pt to sit EOB for meals at this time. Pt refused to get OOB secondary to pain, but willing to sit EOB. Educated pt on no pillow under knee, but importance of pillow or towel under residual limb for knee extension.      Vision Vision Assessment?: No apparent visual deficits          Pertinent Vitals/Pain Pain Assessment: 0-10 Pain Score: 7  Pain Location: abdomen Pain Descriptors / Indicators: Constant;Stabbing Pain Intervention(s): Limited activity within patient's tolerance;Monitored during session     Hand Dominance Right   Extremity/Trunk Assessment Upper Extremity Assessment Upper Extremity Assessment: Generalized weakness (atrophy > right hand)   Lower Extremity Assessment Lower Extremity Assessment: Defer to PT evaluation   Cervical / Trunk Assessment Cervical / Trunk Assessment: Normal   Communication Communication Communication: No difficulties   Cognition Arousal/Alertness: Awake/alert Behavior During Therapy: WFL for tasks assessed/performed Overall Cognitive Status: Within Functional Limits for tasks assessed              Home Living Family/patient expects to be discharged to:: Private residence Living Arrangements: Children Available Help at Discharge: Family Type of Home: Mobile home Home Access: Stairs to enter;Ramped entrance Entrance Stairs-Number of Steps:  4 Entrance Stairs-Rails: Can reach both Home Layout: One level  Bathroom Shower/Tub: Teacher, early years/pre: Standard     Home Equipment: Bedside commode;Other (comment);Walker - 2 wheels;Tub bench (slide board)      Lives With: Daughter    Prior Functioning/Environment Level of Independence: Independent (prior to 4 weeks ago)  Comments: bowling    OT Diagnosis: Generalized weakness;Acute pain   OT Problem List: Decreased strength;Decreased activity tolerance;Impaired balance (sitting and/or standing);Decreased safety awareness;Pain   OT Treatment/Interventions: Self-care/ADL training;Therapeutic exercise;Energy conservation;DME and/or AE instruction;Therapeutic activities;Patient/family education;Balance training    OT Goals(Current goals can be found in the care plan section) Acute Rehab OT Goals Patient Stated Goal: decrease pain OT Goal Formulation: With patient/family Time For Goal Achievement: 02/02/15 Potential to Achieve Goals: Good ADL Goals Pt Will Transfer to Toilet: with min assist;bedside commode;squat pivot transfer Pt/caregiver will Perform Home Exercise Program: Increased strength;Both right and left upper extremity;With Supervision;With written HEP provided;With theraband Additional ADL Goal #1: Pt will be supervision for compression wrapping > right residual limb Additional ADL Goal #2: Patient will be educated on positioning and various techniques to help with pain management   OT Frequency: Min 2X/week   Barriers to D/C: None known at this time   End of Session Activity Tolerance: Patient limited by pain Patient left: in bed;with call bell/phone within reach;with family/visitor present   Time: 3267-1245 OT Time Calculation (min): 30 min Charges:  OT General Charges $OT Visit: 1 Procedure OT Evaluation $Initial OT Evaluation Tier I: 1 Procedure OT Treatments $Self Care/Home Management : 8-22 mins  Janiah Devinney , MS, OTR/L,  CLT Pager: [phone]  01/26/2015, 8:54 AM

## 2015-01-26 NOTE — Progress Notes (Signed)
Oncology brief follow up note  I spoke with pt's daughter [name], who called me early. I explained the scan findings and overall poor prognosis. She would like her mother to go to a inpatient hospice facility in [place], which is close to her family. I suggest her to discuss this with her mother and other family members, and let us know their final decision. Given the rapid progression of her metastatic colon cancer and poor performance status, I think hospice is appropriate.  Truitt Merle  01/26/2015

## 2015-01-27 ENCOUNTER — Other Ambulatory Visit: Payer: Self-pay | Admitting: Hematology

## 2015-01-27 DIAGNOSIS — C189 Malignant neoplasm of colon, unspecified: Secondary | ICD-10-CM

## 2015-01-27 LAB — GLUCOSE, CAPILLARY
Glucose-Capillary: 89 mg/dL (ref 65–99)
Glucose-Capillary: 88 mg/dL (ref 65–99)

## 2015-01-27 MED ORDER — OXYCODONE-ACETAMINOPHEN 5-325 MG PO TABS: 1.0000 | ORAL_TABLET | ORAL | Status: DC | PRN

## 2015-01-27 MED ORDER — MORPHINE SULFATE ER 15 MG PO TBCR: 15.0000 mg | EXTENDED_RELEASE_TABLET | Freq: Two times a day (BID) | ORAL | Status: DC

## 2015-01-27 MED ORDER — LEVOFLOXACIN 500 MG PO TABS: 500.0000 mg | ORAL_TABLET | Freq: Every day | ORAL | Status: DC

## 2015-01-27 MED ORDER — MORPHINE SULFATE ER 15 MG PO TBCR: 15.0000 mg | EXTENDED_RELEASE_TABLET | Freq: Two times a day (BID) | ORAL | Status: AC

## 2015-01-27 MED ORDER — MORPHINE SULFATE 15 MG PO TABS: 15.0000 mg | ORAL_TABLET | ORAL | Status: DC | PRN

## 2015-01-27 MED ORDER — ENSURE ENLIVE PO LIQD: 237.0000 mL | Freq: Two times a day (BID) | ORAL | Status: AC

## 2015-01-27 MED ORDER — LEVOFLOXACIN 500 MG PO TABS
500.0000 mg | ORAL_TABLET | Freq: Every day | ORAL | Status: DC
Start: 2015-01-28 — End: 2015-01-27

## 2015-01-27 NOTE — Progress Notes (Signed)
Physician Discharge Summary  Kathryn Knapp STM:[id number] DOB: 08-Aug-1941 DOA: 01/24/2015  PCP: No PCP Per Patient  Admit date: 01/24/2015 Discharge date: 01/27/2015  Time spent: > 35 minutes  Recommendations for Outpatient Follow-up:  1. Please monitor cbc levels  Discharge Diagnoses:  Principal Problem:   Abdominal pain Active Problems:   S/P partial colectomy   Thrombus   Colon cancer metastasized to multiple sites   Depression   Palliative care encounter   Amputation of right lower extremity below knee   Cough   Diabetes mellitus without complication   UTI (lower urinary tract infection)   GERD (gastroesophageal reflux disease)   Protein-calorie malnutrition, severe   Discharge Condition: stable  Diet recommendation: Carb modified diet  Filed Weights   01/24/15 1814 01/24/15 2217  Weight: 57.153 kg (126 lb) 57 kg (125 lb 10.6 oz)    History of present illness:  From original HPI: 74 y.o. female with PMH of HTN, diet control DM, metastasized colon cancer, s/p of exploratory laparotomy on 12/17/14 by Dr. 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  Cough: Chest x-ray showed retrocardiac density with left moderate pleural effusion. Her effusion is likely due to metastasized disease. The retrocardial density is concerning for pneumonia - Patient however denies any increase in productive sputum - Suspect infection etiology most likely secondary to UTI - Urine Legionella antigen negative strep pneumonia antigen negative  Metastasized colon cancer: She  has been followed up and treated by Dr. Krista Blue. Last seen was today. Per Dr. Rhea Belton note, she is [phone], stage IV, with peritoneal and left adrenal metastasis. She has sporadic Lynch syndrome.Not curable, and overall prognosis is very poor. Not a good candidate for chemotherapy. PDL 1 inhibitor was considered, unfortunately, PDL 1 inhibitor has not been approved by FDA. Dr Krista Blue plans to touch base with her insurance company to see if they would approve PDL 1 inhibitor pembrolizumab. -Symptomatic treatment for pain and nausea, we'll plan on placing back on home pain medication regimen -follow up with Dr. Krista Blue currently Dr. Krista Blue agreeable with transitioning patient to hospice. Patient states she will not be able to afford this however.  Hx of right leg ischemia: s/p of R BKA. -on Lovenx  Diet controled DM-II: Last A1c 6.3 on 12/24/14  -relatively well controlled on SSI - diabetic diet  GERD: - stable, continue Protonix  Depression: Stable, no suicidal or homicidal ideations. - Stable, Continue Prozac  Procedures:  None  Consultations:  None  Discharge Exam: Filed Vitals:   01/27/15 0445  BP: 126/52  Pulse: 90  Temp: 97.9 F (36.6 C)  Resp: 20    General: pt in nad, alert and awake Cardiovascular: rrr, no mrg Respiratory: equal chest rise, no wheezes, speaking in full sentences, no increased wob  Discharge Instructions   Discharge Instructions    Call MD for:  redness, tenderness, or signs of infection (pain, swelling, redness, odor or green/yellow discharge around incision site)    Complete by:  As directed      Call MD for:  temperature >100.4    Complete by:  As directed  Diet - low sodium heart healthy    Complete by:  As directed      Increase activity slowly    Complete by:  As directed           Current Discharge Medication List    START taking these medications   Details  feeding supplement, ENSURE ENLIVE, (ENSURE ENLIVE) LIQD Take 237 mLs by mouth 2  (two) times daily between meals. Qty: 237 mL, Refills: 12    levofloxacin (LEVAQUIN) 500 MG tablet Take 1 tablet (500 mg total) by mouth daily. Qty: 3 tablet, Refills: 0      CONTINUE these medications which have CHANGED   Details  morphine (MSIR) 15 MG tablet Take 1 tablet (15 mg total) by mouth every 4 (four) hours as needed for severe pain. Qty: 20 tablet, Refills: 0   Associated Diagnoses: Colon cancer metastasized to multiple sites    oxyCODONE-acetaminophen (PERCOCET/ROXICET) 5-325 MG per tablet Take 1-2 tablets by mouth every 4 (four) hours as needed for moderate pain or severe pain. Qty: 30 tablet, Refills: 0      CONTINUE these medications which have NOT CHANGED   Details  acetaminophen (TYLENOL) 325 MG tablet Take 1-2 tablets (325-650 mg total) by mouth every 4 (four) hours as needed for mild pain (or temp >/= 101 F).    docusate sodium (COLACE) 100 MG capsule Take 1 capsule (100 mg total) by mouth daily. Qty: 10 capsule, Refills: 0    enoxaparin (LOVENOX) 30 MG/0.3ML injection Inject 0.9 mLs (90 mg total) into the skin daily. Qty: 30 Syringe, Refills: 1    FLUoxetine (PROZAC) 20 MG capsule Take 1 capsule (20 mg total) by mouth daily. Qty: 30 capsule, Refills: 3    gabapentin (NEURONTIN) 100 MG capsule Take 1 capsule (100 mg total) by mouth 3 (three) times daily. Qty: 90 capsule, Refills: 1    methocarbamol (ROBAXIN) 500 MG tablet Take 1 tablet (500 mg total) by mouth every 6 (six) hours as needed for muscle spasms. Qty: 90 tablet, Refills: 0    pantoprazole (PROTONIX) 40 MG tablet Take 1 tablet (40 mg total) by mouth daily. Qty: 30 tablet, Refills: 1      STOP taking these medications     amLODipine (NORVASC) 5 MG tablet        No Known Allergies Follow-up Information    Follow up with [facility].   Why:  HHRN/HHPT/HHOT/social worker   Contact information:   [address] [place] [address] [phone]        The  results of significant diagnostics from this hospitalization (including imaging, microbiology, ancillary and laboratory) are listed below for reference.    Significant Diagnostic Studies: Dg Chest 2 View  01/24/2015   CLINICAL DATA:  Shortness of breath and LEFT lower flank pain. No other complaints. Hypertension and diabetes. Duration unspecified.  EXAM: CHEST  2 VIEW  COMPARISON:  01/02/2015.  FINDINGS: Continued retrocardiac opacity with moderate-size LEFT pleural effusion. Atelectasis, and superimposed infiltrate not excluded. Normal cardiomediastinal silhouette otherwise. RIGHT lung clear. No bony abnormality.  Compared with priors, the RIGHT effusion is resolved. The LEFT effusion may be slightly improved.  IMPRESSION: Continued retrocardiac density along with moderate LEFT pleural effusion. Improved aeration on the RIGHT.  Followup PA and lateral chest X-ray is recommended in 3-4 weeks following trial of antibiotic therapy to ensure resolution and exclude underlying malignancy.   Electronically Signed   By: Rolla Flatten M.D.   On: 01/24/2015 20:28  Dg Chest 2 View  01/02/2015   CLINICAL DATA:  Shortness of Breath  EXAM: CHEST  2 VIEW  COMPARISON:  12/21/2014  FINDINGS: Cardiac shadow is enlarged. Bilateral pleural effusions are noted which have increased in the interval from the prior exam. The left effusion is larger than the right. Mild vascular congestion is seen. No acute bony abnormality is noted.  IMPRESSION: Increasing bilateral pleural effusions left greater than right.   Electronically Signed   By: [name] Catalina M.D.   On: 01/02/2015 16:05   Ct Abdomen Pelvis W Contrast  01/24/2015   CLINICAL DATA:  Diagnosed with colon cancer 1 month ago. Now with nausea and vomiting.  EXAM: CT ABDOMEN AND PELVIS WITH CONTRAST  TECHNIQUE: Multidetector CT imaging of the abdomen and pelvis was performed using the standard protocol following bolus administration of intravenous contrast.  CONTRAST:  176mL  OMNIPAQUE IOHEXOL 300 MG/ML SOLN, 61mL OMNIPAQUE IOHEXOL 300 MG/ML SOLN  COMPARISON:  CT abdomen pelvis - 12/25/2014; 12/16/2014  FINDINGS: There is residual abnormal omental thickening about the left upper abdominal quadrant (representative images 14 and 18, series 2, worrisome for residual and/or locally recurrent malignancy. All exact measurements are difficult, the dominant component of this abnormal soft tissue within the left upper abdominal quadrant measures approximately 10.4 x 3.0 cm (image 19, series 2). These findings are associated with progressive retroperitoneal, porta hepatis and mesenteric adenopathy with index left-sided periaortic lymph node now measuring 1.2 cm in greatest short axis diameter (image 38, series 2), previously, 0.7 cm; index left periaortic lymph node now measuring approximately 1.3 cm in diameter (image 43, series 2), previously, 0.7 cm; index gastrohepatic ligament lymph node now measuring 1.2 cm in diameter (image 21, series 2), previously, 0.8 cm. Multiple metastatic implants are again noted about the ventral aspect of the omentum with index implant measuring approximately 1.1 cm in diameter (image 36, series 2).  Normal hepatic contour. Post cholecystectomy. Common bile duct remains mildly dilated, measuring approximately 1.1 cm in diameter with associated mild centralized intrahepatic biliary duct dilatation. Interval development of an approximately 1.3 cm hypo attenuating nodule within in the lateral segment of the left lobe of the liver adjacent to the fissure for ligamentum teres. Mid amount of focal fatty infiltration adjacent to the fissure for ligamentum teres (image 28, series 2). No ascites.  There is symmetric enhancement and excretion of the bilateral kidneys. No definite renal stones on this postcontrast examination. No discrete renal lesions. No urine obstruction or perinephric stranding.  Interval increase in suspected left adrenal gland metastasis, currently  measuring 2.6 cm in length, previously, 2.4 cm. Normal appearance of the right adrenal gland, normal appearance of the right adrenal gland and pancreas. Several calcifications are again noted about the splenic hilum.  Stable sequela of hemicolectomy and end ileostomy creation within the right lower abdominal quadrant. Enteric contrast extends to the level of the distal small bowel. No evidence of enteric obstruction. No pneumoperitoneum, pneumatosis or portal venous gas. There is mild diffuse stranding throughout the abdominal mesenteric without definable/drainable fluid collection.  Moderate amount of mixed calcified and noncalcified atherosclerotic plaque within a normal caliber abdominal aorta. The major branch vessels of the abdominal aorta appear patent on this non CTA examination.  Limited visualization lower thorax demonstrates a persistent small to moderate size left-sided effusion. Resolved right-sided pleural effusion. Minimal residual bibasilar atelectasis with partial atelectasis / collapse of the imaged left lower lobe.  Normal heart size. Trace amount of pericardial fluid, potentially physiologic.  No  acute or aggressive osseous abnormalities. Mild anterolisthesis of L4 upon L5 without associated pars defects. Mild-to-moderate multilevel lumbar spine DDD, worse at L5-S1 with disc space height loss, endplate irregularity and sclerosis. A hemangioma is noted within the T11 vertebral body.  There is an apparent open midline abdominal wound (image 51, series 2) without associated hernia formation. Mild diffuse body wall anasarca, improvement prior examination.  IMPRESSION: 1. Ill-defined soft tissue within the left upper abdominal quadrant worrisome for residual and/or locally recurrent disease with dominant residual soft tissue measuring approximately 10.4 cm in diameter. 2. Findings compatible with progression of metastatic disease with worsening retroperitoneal, porta-hepatis and mesenteric adenopathy  and interval increase in size of suspected left adrenal gland metastasis. 3. Interval development of an approximately 1.3 cm liver lesion compatible with hepatic metastatic disease. 4. Post hemicolectomy and end ileostomy creation without evidence of enteric obstruction. Note of definable/drainable intra-abdominal fluid collection. 5. Persistent open midline abdominal wound without associated hernia.   Electronically Signed   By: Sandi Mariscal M.D.   On: 01/24/2015 20:13   Dg Shoulder Left  01/10/2015   CLINICAL DATA:  Nontraumatic left shoulder pain, initial encounter  EXAM: LEFT SHOULDER - 2+ VIEW  COMPARISON:  Chest radiograph 01/02/2015, chest CT 12/17/2014  FINDINGS: There is no evidence of fracture or dislocation. There is no evidence of arthropathy or other focal bone abnormality. Soft tissues are unremarkable. Aortic ectasia and soft tissue left suprahilar fullness reidentified, incompletely imaged, but likely corresponding to vascular pedicle as seen on the dedicated prior exam 12/17/2014.  IMPRESSION: Negative.   Electronically Signed   By: Conchita Paris M.D.   On: 01/10/2015 12:18    Microbiology: Recent Results (from the past 240 hour(s))  Urine culture     Status: None   Collection Time: 01/24/15  5:34 PM  Result Value Ref Range Status   Specimen Description URINE, CLEAN CATCH  Final   Special Requests NONE  Final   Colony Count   Final    8,000 COLONIES/ML Performed at [facility]    Culture   Final    INSIGNIFICANT GROWTH Performed at [facility]    Report Status 01/25/2015 FINAL  Final  Culture, blood (routine x 2) Call MD if unable to obtain prior to antibiotics being given     Status: None (Preliminary result)   Collection Time: 01/24/15 11:00 PM  Result Value Ref Range Status   Specimen Description BLOOD LEFT HAND  Final   Special Requests AEROBIC BOTTLE ONLY  Final   Culture   Final           BLOOD CULTURE RECEIVED NO GROWTH TO DATE CULTURE WILL BE  HELD FOR 5 DAYS BEFORE ISSUING A FINAL NEGATIVE REPORT Performed at [facility]    Report Status PENDING  Incomplete  Culture, blood (routine x 2) Call MD if unable to obtain prior to antibiotics being given     Status: None (Preliminary result)   Collection Time: 01/24/15 11:00 PM  Result Value Ref Range Status   Specimen Description LEFT ANTECUBITAL  Final   Special Requests AEROBIC BOTTLE ONLY  Final   Culture   Final           BLOOD CULTURE RECEIVED NO GROWTH TO DATE CULTURE WILL BE HELD FOR 5 DAYS BEFORE ISSUING A FINAL NEGATIVE REPORT Performed at [facility]    Report Status PENDING  Incomplete     Labs: Basic Metabolic Panel:  Recent Labs Lab 01/24/15  1814 01/25/15 0148  NA 138 137  K 4.0 3.6  CL 101 104  CO2 26 25  GLUCOSE 110* 95  BUN 19 15  CREATININE 0.73 0.56  CALCIUM 9.3 8.5*   Liver Function Tests:  Recent Labs Lab 01/24/15 1814 01/25/15 0148  AST 24 19  ALT 16 12*  ALKPHOS 175* 146*  BILITOT 0.5 0.5  PROT 7.6 6.3*  ALBUMIN 2.8* 2.4*   No results for input(s): LIPASE, AMYLASE in the last 168 hours. No results for input(s): AMMONIA in the last 168 hours. CBC:  Recent Labs Lab 01/24/15 1814 01/25/15 0148  WBC 11.0* 7.7  NEUTROABS 7.7  --   HGB 9.8* 9.0*  HCT 32.3* 30.1*  MCV 87.5 87.8  PLT 537* 440*   Cardiac Enzymes: No results for input(s): CKTOTAL, CKMB, CKMBINDEX, TROPONINI in the last 168 hours. BNP: BNP (last 3 results) No results for input(s): BNP in the last 8760 hours.  ProBNP (last 3 results) No results for input(s): PROBNP in the last 8760 hours.  CBG:  Recent Labs Lab 01/26/15 0727 01/26/15 1144 01/26/15 1659 01/26/15 2143 01/27/15 0722  GLUCAP 84 96 88 93 89       Signed:  Zaydon Knapp  [facility] 01/27/2015, 11:10 AM

## 2015-01-27 NOTE — Discharge Summary (Signed)
Physician Discharge Summary  Kathryn Knapp XLK:[id number] DOB: May 15, 1941 DOA: 01/24/2015  PCP: No PCP Per Patient  Admit date: 01/24/2015 Discharge date: 01/27/2015  Time spent: > 35 minutes  Recommendations for Outpatient Follow-up:  1. Please monitor cbc levels  Discharge Diagnoses:  Principal Problem:   Abdominal pain Active Problems:   S/P partial colectomy   Thrombus   Colon cancer metastasized to multiple sites   Depression   Palliative care encounter   Amputation of right lower extremity below knee   Cough   Diabetes mellitus without complication   UTI (lower urinary tract infection)   GERD (gastroesophageal reflux disease)   Protein-calorie malnutrition, severe   Discharge Condition: stable  Diet recommendation: Carb modified diet  Filed Weights   01/24/15 1814 01/24/15 2217  Weight: 57.153 kg (126 lb) 57 kg (125 lb 10.6 oz)    History of present illness:  From original HPI: 74 y.o. female with PMH of HTN, diet control DM, metastasized colon cancer, s/p of exploratory laparotomy on 12/17/14 by Dr. 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  Cough: Chest x-ray showed retrocardiac density with left moderate pleural effusion. Her effusion is likely due to metastasized disease. The retrocardial density is concerning for pneumonia - Patient however denies any increase in productive sputum - Suspect infection etiology most likely secondary to UTI - Urine Legionella antigen negative strep pneumonia antigen negative  Metastasized colon cancer: She  has been followed up and treated by Dr. Krista Blue. Last seen was today. Per Dr. Rhea Belton note, she is [phone], stage IV, with peritoneal and left adrenal metastasis. She has sporadic Lynch syndrome.Not curable, and overall prognosis is very poor. Not a good candidate for chemotherapy. PDL 1 inhibitor was considered, unfortunately, PDL 1 inhibitor has not been approved by FDA. Dr Krista Blue plans to touch base with her insurance company to see if they would approve PDL 1 inhibitor pembrolizumab. -Symptomatic treatment for pain and nausea, we'll plan on placing back on home pain medication regimen -follow up with Dr. Krista Blue currently Dr. Krista Blue agreeable with transitioning patient to hospice. Patient states she will not be able to afford this however.  Hx of right leg ischemia: s/p of R BKA. -on Lovenx  Diet controled DM-II: Last A1c 6.3 on 12/24/14  -relatively well controlled on SSI - diabetic diet  GERD: - stable, continue Protonix  Depression: Stable, no suicidal or homicidal ideations. - Stable, Continue Prozac  Procedures:  None  Consultations:  None  Discharge Exam: Filed Vitals:   01/27/15 0445  BP: 126/52  Pulse: 90  Temp: 97.9 F (36.6 C)  Resp: 20    General: pt in nad, alert and awake Cardiovascular: rrr, no mrg Respiratory: equal chest rise, no wheezes, speaking in full sentences, no increased wob  Discharge Instructions   Discharge Instructions    Call MD for:  redness, tenderness, or signs of infection (pain, swelling, redness, odor or green/yellow discharge around incision site)    Complete by:  As directed      Call MD for:  temperature >100.4    Complete by:  As directed  Diet - low sodium heart healthy    Complete by:  As directed      Increase activity slowly    Complete by:  As directed           Current Discharge Medication List    START taking these medications   Details  feeding supplement, ENSURE ENLIVE, (ENSURE ENLIVE) LIQD Take 237 mLs by mouth 2  (two) times daily between meals. Qty: 237 mL, Refills: 12    levofloxacin (LEVAQUIN) 500 MG tablet Take 1 tablet (500 mg total) by mouth daily. Qty: 3 tablet, Refills: 0    !! morphine (MS CONTIN) 15 MG 12 hr tablet Take 1 tablet (15 mg total) by mouth every 12 (twelve) hours. Qty: 60 tablet, Refills: 0    !! morphine (MS CONTIN) 15 MG 12 hr tablet Take 1 tablet (15 mg total) by mouth every 12 (twelve) hours. Qty: 60 tablet, Refills: 0     !! - Potential duplicate medications found. Please discuss with provider.    CONTINUE these medications which have NOT CHANGED   Details  acetaminophen (TYLENOL) 325 MG tablet Take 1-2 tablets (325-650 mg total) by mouth every 4 (four) hours as needed for mild pain (or temp >/= 101 F).    docusate sodium (COLACE) 100 MG capsule Take 1 capsule (100 mg total) by mouth daily. Qty: 10 capsule, Refills: 0    enoxaparin (LOVENOX) 30 MG/0.3ML injection Inject 0.9 mLs (90 mg total) into the skin daily. Qty: 30 Syringe, Refills: 1    FLUoxetine (PROZAC) 20 MG capsule Take 1 capsule (20 mg total) by mouth daily. Qty: 30 capsule, Refills: 3    gabapentin (NEURONTIN) 100 MG capsule Take 1 capsule (100 mg total) by mouth 3 (three) times daily. Qty: 90 capsule, Refills: 1    methocarbamol (ROBAXIN) 500 MG tablet Take 1 tablet (500 mg total) by mouth every 6 (six) hours as needed for muscle spasms. Qty: 90 tablet, Refills: 0    pantoprazole (PROTONIX) 40 MG tablet Take 1 tablet (40 mg total) by mouth daily. Qty: 30 tablet, Refills: 1    !! morphine (MS CONTIN) 15 MG 12 hr tablet Take 1 tablet (15 mg total) by mouth every 12 (twelve) hours. Qty: 60 tablet, Refills: 0   Associated Diagnoses: Colon cancer metastasized to multiple sites    !! morphine (MS CONTIN) 15 MG 12 hr tablet Take 1 tablet (15 mg total) by mouth every 12 (twelve) hours. Qty: 60 tablet, Refills: 0    !! morphine (MS CONTIN) 15 MG 12 hr tablet Take 1 tablet (15 mg total) by mouth every  12 (twelve) hours. Qty: 60 tablet, Refills: 0     !! - Potential duplicate medications found. Please discuss with provider.    STOP taking these medications     amLODipine (NORVASC) 5 MG tablet      morphine (MSIR) 15 MG tablet      oxyCODONE-acetaminophen (PERCOCET/ROXICET) 5-325 MG per tablet        No Known Allergies Follow-up Information    Follow up with [facility].   Why:  HHRN/HHPT/HHOT/social worker   Contact information:   [address] [place] [address] [phone]        The results of significant diagnostics from this hospitalization (including imaging, microbiology, ancillary and laboratory) are listed below for reference.    Significant Diagnostic Studies: Dg Chest 2 View  01/24/2015   CLINICAL DATA:  Shortness of breath and LEFT lower flank  pain. No other complaints. Hypertension and diabetes. Duration unspecified.  EXAM: CHEST  2 VIEW  COMPARISON:  01/02/2015.  FINDINGS: Continued retrocardiac opacity with moderate-size LEFT pleural effusion. Atelectasis, and superimposed infiltrate not excluded. Normal cardiomediastinal silhouette otherwise. RIGHT lung clear. No bony abnormality.  Compared with priors, the RIGHT effusion is resolved. The LEFT effusion may be slightly improved.  IMPRESSION: Continued retrocardiac density along with moderate LEFT pleural effusion. Improved aeration on the RIGHT.  Followup PA and lateral chest X-ray is recommended in 3-4 weeks following trial of antibiotic therapy to ensure resolution and exclude underlying malignancy.   Electronically Signed   By: Rolla Flatten M.D.   On: 01/24/2015 20:28   Dg Chest 2 View  01/02/2015   CLINICAL DATA:  Shortness of Breath  EXAM: CHEST  2 VIEW  COMPARISON:  12/21/2014  FINDINGS: Cardiac shadow is enlarged. Bilateral pleural effusions are noted which have increased in the interval from the prior exam. The left effusion is larger than the right. Mild vascular congestion is  seen. No acute bony abnormality is noted.  IMPRESSION: Increasing bilateral pleural effusions left greater than right.   Electronically Signed   By: [name] Catalina M.D.   On: 01/02/2015 16:05   Ct Abdomen Pelvis W Contrast  01/24/2015   CLINICAL DATA:  Diagnosed with colon cancer 1 month ago. Now with nausea and vomiting.  EXAM: CT ABDOMEN AND PELVIS WITH CONTRAST  TECHNIQUE: Multidetector CT imaging of the abdomen and pelvis was performed using the standard protocol following bolus administration of intravenous contrast.  CONTRAST:  144mL OMNIPAQUE IOHEXOL 300 MG/ML SOLN, 30mL OMNIPAQUE IOHEXOL 300 MG/ML SOLN  COMPARISON:  CT abdomen pelvis - 12/25/2014; 12/16/2014  FINDINGS: There is residual abnormal omental thickening about the left upper abdominal quadrant (representative images 14 and 18, series 2, worrisome for residual and/or locally recurrent malignancy. All exact measurements are difficult, the dominant component of this abnormal soft tissue within the left upper abdominal quadrant measures approximately 10.4 x 3.0 cm (image 19, series 2). These findings are associated with progressive retroperitoneal, porta hepatis and mesenteric adenopathy with index left-sided periaortic lymph node now measuring 1.2 cm in greatest short axis diameter (image 38, series 2), previously, 0.7 cm; index left periaortic lymph node now measuring approximately 1.3 cm in diameter (image 43, series 2), previously, 0.7 cm; index gastrohepatic ligament lymph node now measuring 1.2 cm in diameter (image 21, series 2), previously, 0.8 cm. Multiple metastatic implants are again noted about the ventral aspect of the omentum with index implant measuring approximately 1.1 cm in diameter (image 36, series 2).  Normal hepatic contour. Post cholecystectomy. Common bile duct remains mildly dilated, measuring approximately 1.1 cm in diameter with associated mild centralized intrahepatic biliary duct dilatation. Interval development of an  approximately 1.3 cm hypo attenuating nodule within in the lateral segment of the left lobe of the liver adjacent to the fissure for ligamentum teres. Mid amount of focal fatty infiltration adjacent to the fissure for ligamentum teres (image 28, series 2). No ascites.  There is symmetric enhancement and excretion of the bilateral kidneys. No definite renal stones on this postcontrast examination. No discrete renal lesions. No urine obstruction or perinephric stranding.  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 Normal appearance of the right adrenal gland, normal appearance of the right adrenal gland and pancreas. Several calcifications are again noted about the splenic hilum.  Stable sequela of hemicolectomy and end ileostomy creation  within the right lower abdominal quadrant. Enteric contrast extends to the level of the distal small bowel. No evidence of enteric obstruction. No pneumoperitoneum, pneumatosis or portal venous gas. There is mild diffuse stranding throughout the abdominal mesenteric without definable/drainable fluid collection.  Moderate amount of mixed calcified and noncalcified atherosclerotic plaque within a normal caliber abdominal aorta. The major branch vessels of the abdominal aorta appear patent on this non CTA examination.  Limited visualization lower thorax demonstrates a persistent small to moderate size left-sided effusion. Resolved right-sided pleural effusion. Minimal residual bibasilar atelectasis with partial atelectasis / collapse of the imaged left lower lobe.  Normal heart size. Trace amount of pericardial fluid, potentially physiologic.  No acute or aggressive osseous abnormalities. Mild anterolisthesis of L4 upon L5 without associated pars defects. Mild-to-moderate multilevel lumbar spine DDD, worse at L5-S1 with disc space height loss, endplate irregularity and sclerosis. A hemangioma is noted within  the T11 vertebral body.  There is an apparent open midline abdominal wound (image 51, series 2) without associated hernia formation. Mild diffuse body wall anasarca, improvement prior examination.  IMPRESSION: 1. Ill-defined soft tissue within the left upper abdominal quadrant worrisome for residual and/or locally recurrent disease with dominant residual soft tissue measuring approximately 10.4 cm in diameter. 2. Findings compatible with progression of metastatic disease with worsening retroperitoneal, porta-hepatis and mesenteric adenopathy and interval increase in size of suspected left adrenal gland metastasis. 3. Interval development of an approximately 1.3 cm liver lesion compatible with hepatic metastatic disease. 4. Post hemicolectomy and end ileostomy creation without evidence of enteric obstruction. Note of definable/drainable intra-abdominal fluid collection. 5. Persistent open midline abdominal wound without associated hernia.   Electronically Signed   By: Sandi Mariscal M.D.   On: 01/24/2015 20:13   Dg Shoulder Left  01/10/2015   CLINICAL DATA:  Nontraumatic left shoulder pain, initial encounter  EXAM: LEFT SHOULDER - 2+ VIEW  COMPARISON:  Chest radiograph 01/02/2015, chest CT 12/17/2014  FINDINGS: There is no evidence of fracture or dislocation. There is no evidence of arthropathy or other focal bone abnormality. Soft tissues are unremarkable. Aortic ectasia and soft tissue left suprahilar fullness reidentified, incompletely imaged, but likely corresponding to vascular pedicle as seen on the dedicated prior exam 12/17/2014.  IMPRESSION: Negative.   Electronically Signed   By: Conchita Paris M.D.   On: 01/10/2015 12:18    Microbiology: Recent Results (from the past 240 hour(s))  Urine culture     Status: None   Collection Time: 01/24/15  5:34 PM  Result Value Ref Range Status   Specimen Description URINE, CLEAN CATCH  Final   Special Requests NONE  Final   Colony Count   Final    8,000  COLONIES/ML Performed at [facility]    Culture   Final    INSIGNIFICANT GROWTH Performed at [facility]    Report Status 01/25/2015 FINAL  Final  Culture, blood (routine x 2) Call MD if unable to obtain prior to antibiotics being given     Status: None (Preliminary result)   Collection Time: 01/24/15 11:00 PM  Result Value Ref Range Status   Specimen Description BLOOD LEFT HAND  Final   Special Requests AEROBIC BOTTLE ONLY  Final   Culture   Final           BLOOD CULTURE RECEIVED NO GROWTH TO DATE CULTURE WILL BE HELD FOR 5 DAYS BEFORE ISSUING A FINAL NEGATIVE REPORT Performed at [facility]    Report Status PENDING  Incomplete  Culture, blood (routine x 2) Call MD if unable to obtain prior to antibiotics being given     Status: None (Preliminary result)   Collection Time: 01/24/15 11:00 PM  Result Value Ref Range Status   Specimen Description LEFT ANTECUBITAL  Final   Special Requests AEROBIC BOTTLE ONLY  Final   Culture   Final           BLOOD CULTURE RECEIVED NO GROWTH TO DATE CULTURE WILL BE HELD FOR 5 DAYS BEFORE ISSUING A FINAL NEGATIVE REPORT Performed at [facility]    Report Status PENDING  Incomplete     Labs: Basic Metabolic Panel:  Recent Labs Lab 01/24/15 1814 01/25/15 0148  NA 138 137  K 4.0 3.6  CL 101 104  CO2 26 25  GLUCOSE 110* 95  BUN 19 15  CREATININE 0.73 0.56  CALCIUM 9.3 8.5*   Liver Function Tests:  Recent Labs Lab 01/24/15 1814 01/25/15 0148  AST 24 19  ALT 16 12*  ALKPHOS 175* 146*  BILITOT 0.5 0.5  PROT 7.6 6.3*  ALBUMIN 2.8* 2.4*   No results for input(s): LIPASE, AMYLASE in the last 168 hours. No results for input(s): AMMONIA in the last 168 hours. CBC:  Recent Labs Lab 01/24/15 1814 01/25/15 0148  WBC 11.0* 7.7  NEUTROABS 7.7  --   HGB 9.8* 9.0*  HCT 32.3* 30.1*  MCV 87.5 87.8  PLT 537* 440*   Cardiac Enzymes: No results for input(s): CKTOTAL, CKMB, CKMBINDEX, TROPONINI in the  last 168 hours. BNP: BNP (last 3 results) No results for input(s): BNP in the last 8760 hours.  ProBNP (last 3 results) No results for input(s): PROBNP in the last 8760 hours.  CBG:  Recent Labs Lab 01/26/15 1144 01/26/15 1659 01/26/15 2143 01/27/15 0722 01/27/15 1135  GLUCAP 96 88 93 89 88       Signed:  Ronalee Scheunemann  [facility] 01/27/2015, 12:40 PM

## 2015-01-27 NOTE — Care Management Note (Addendum)
Case Management Note  Patient Details  Name: Kathryn Knapp MRN: [id number] Date of Birth: 09-Jul-1941  Subjective/Objective:           Colon cancer          Action/Plan: Home with Home Health  Expected Discharge Date:  01/27/2015                Expected Discharge Plan:  [place]  In-House Referral:     Discharge planning Services  CM Consult (No medicare part D coverage.)  Post Acute Care Choice:  Home Health Choice offered to:  Patient  DME Arranged:    DME Agency:     HH Arranged:  RN, OT, PT ([place] social worker) [place]:  [place]  Status of Service:  Completed, signed off  Medicare Important Message Given:  Yes Date Medicare IM Given:  01/27/15 Medicare IM give by:  Jonnie Finner RN CCM  Date Additional Medicare IM Given:    Additional Medicare Important Message give by:     If discussed at [place] of Stay Meetings, dates discussed:    Additional Comments: Consulted for medication assistance and HH. NCM spoke to pt and states she has Lovenox at home. Gave permission to speak to dtr. Dtr states they were provided Lovenox from [facility] and have enough syringes to last until she goes back to [facility] on Thursday. Contacted [facility] to make aware of scheduled dc home today with HH.  Provided dtr with coupon from [employer] to get Levaquin $9.67 at [facility].   Erenest Rasher, RN 01/27/2015, 1:23 PM

## 2015-01-27 NOTE — Progress Notes (Signed)
Kathryn Knapp   DOB:[birth year]   AF#:[id number]   VAN#:[id number]  Subjective: Her abdominal pain is better, but still quite severe when she moves around and the pain medication wears out. she is going home today.  Objective:  Filed Vitals:   01/27/15 0445  BP: 126/52  Pulse: 90  Temp: 97.9 F (36.6 C)  Resp: 20    Body mass index is 17.53 kg/(m^2).  Intake/Output Summary (Last 24 hours) at 01/27/15 1426 Last data filed at 01/27/15 1052  Gross per 24 hour  Intake   2500 ml  Output   1825 ml  Net    675 ml     Sclerae unicteric  Oropharynx clear  No peripheral adenopathy  Lungs clear -- no rales or rhonchi, decreased breath sound on left   Heart regular rate and rhythm  Abdomen benign  MSK no focal spinal tenderness, no peripheral edema  Neuro nonfocal   CBG (last 3)   Recent Labs  01/26/15 2143 01/27/15 0722 01/27/15 1135  GLUCAP 93 89 88     Labs:  Lab Results  Component Value Date   WBC 7.7 01/25/2015   HGB 9.0* 01/25/2015   HCT 30.1* 01/25/2015   MCV 87.8 01/25/2015   PLT 440* 01/25/2015   NEUTROABS 7.7 01/24/2015    _0 @  Urine Studies No results for input(s): UHGB, CRYS in the last 72 hours.  Invalid input(s): UACOL, UAPR, USPG, UPH, UTP, UGL, UKET, UBIL, UNIT, UROB, ULEU, UEPI, UWBC, URBC, UBAC, CAST, UCOM, BILUA  Basic Metabolic Panel:  Recent Labs Lab 01/24/15 1814 01/25/15 0148  NA 138 137  K 4.0 3.6  CL 101 104  CO2 26 25  GLUCOSE 110* 95  BUN 19 15  CREATININE 0.73 0.56  CALCIUM 9.3 8.5*   GFR Estimated Creatinine Clearance: 56.4 mL/min (by C-G formula based on Cr of 0.56). Liver Function Tests:  Recent Labs Lab 01/24/15 1814 01/25/15 0148  AST 24 19  ALT 16 12*  ALKPHOS 175* 146*  BILITOT 0.5 0.5  PROT 7.6 6.3*  ALBUMIN 2.8* 2.4*   No results for input(s): LIPASE, AMYLASE in the last 168 hours. No results for input(s): AMMONIA in the last 168 hours. Coagulation profile  Recent Labs Lab 01/24/15 2300   INR 1.15    CBC:  Recent Labs Lab 01/24/15 1814 01/25/15 0148  WBC 11.0* 7.7  NEUTROABS 7.7  --   HGB 9.8* 9.0*  HCT 32.3* 30.1*  MCV 87.5 87.8  PLT 537* 440*   Cardiac Enzymes: No results for input(s): CKTOTAL, CKMB, CKMBINDEX, TROPONINI in the last 168 hours. BNP: Invalid input(s): POCBNP CBG:  Recent Labs Lab 01/26/15 1144 01/26/15 1659 01/26/15 2143 01/27/15 0722 01/27/15 1135  GLUCAP 96 88 93 89 88   D-Dimer No results for input(s): DDIMER in the last 72 hours. Hgb A1c No results for input(s): HGBA1C in the last 72 hours. Lipid Profile No results for input(s): CHOL, HDL, LDLCALC, TRIG, CHOLHDL, LDLDIRECT in the last 72 hours. Thyroid function studies No results for input(s): TSH, T4TOTAL, T3FREE, THYROIDAB in the last 72 hours.  Invalid input(s): FREET3 Anemia work up No results for input(s): VITAMINB12, FOLATE, FERRITIN, TIBC, IRON, RETICCTPCT in the last 72 hours. Microbiology Recent Results (from the past 240 hour(s))  Urine culture     Status: None   Collection Time: 01/24/15  5:34 PM  Result Value Ref Range Status   Specimen Description URINE, CLEAN CATCH  Final   Special Requests NONE  Final  Colony Count   Final    8,000 COLONIES/ML Performed at [facility]    Culture   Final    INSIGNIFICANT GROWTH Performed at [facility]    Report Status 01/25/2015 FINAL  Final  Culture, blood (routine x 2) Call MD if unable to obtain prior to antibiotics being given     Status: None (Preliminary result)   Collection Time: 01/24/15 11:00 PM  Result Value Ref Range Status   Specimen Description BLOOD LEFT HAND  Final   Special Requests AEROBIC BOTTLE ONLY  Final   Culture   Final           BLOOD CULTURE RECEIVED NO GROWTH TO DATE CULTURE WILL BE HELD FOR 5 DAYS BEFORE ISSUING A FINAL NEGATIVE REPORT Performed at [facility]    Report Status PENDING  Incomplete  Culture, blood (routine x 2) Call MD if unable to obtain  prior to antibiotics being given     Status: None (Preliminary result)   Collection Time: 01/24/15 11:00 PM  Result Value Ref Range Status   Specimen Description LEFT ANTECUBITAL  Final   Special Requests AEROBIC BOTTLE ONLY  Final   Culture   Final           BLOOD CULTURE RECEIVED NO GROWTH TO DATE CULTURE WILL BE HELD FOR 5 DAYS BEFORE ISSUING A FINAL NEGATIVE REPORT Performed at [facility]    Report Status PENDING  Incomplete      Studies:  No results found.  Assessment: 74 y.o. female  1. Metastatic colon cancer, [id number], stage IV, with peritoneal and left adrenal metastasis. MSI-H, BRAF V600E1 (+)  2. UTI 3. Left pleural effusion, probable malignant, questionable pneumonia 4.  Right foot ischemia, status post BKA 5. Anorexia, weight loss 6. Left abdominal pain, secondary to malignancy.   Plan:  -We discussed about hospice, she is concerned about financial burden. I ensure her hospice is covered by her insurance without any out-of-pocket cost. She will discuss with her daughters. -I schedule her to see me back next Friday. -I recommend palliative home care, she knows to call me if she needs it. -I recommend her to take MS Contin 15 mg every 12 hours plus morphine immediate release 15 mg every 4 hours as needed. Prescription was given to her today.  Truitt Merle, MD 01/27/2015  2:26 PM

## 2015-01-29 ENCOUNTER — Telehealth: Payer: Self-pay | Admitting: *Deleted

## 2015-01-29 ENCOUNTER — Telehealth: Payer: Self-pay | Admitting: Hematology

## 2015-01-29 ENCOUNTER — Other Ambulatory Visit: Payer: Self-pay | Admitting: *Deleted

## 2015-01-29 ENCOUNTER — Inpatient Hospital Stay: Payer: Medicare Other | Admitting: Physical Medicine & Rehabilitation

## 2015-01-29 NOTE — Telephone Encounter (Signed)
per pof to sch pt appt-per pof pt aware and has been contacted

## 2015-01-29 NOTE — Telephone Encounter (Signed)
Attempted to reach patient to follow up on her discharge this weekend. Left voice mail at daughter's home #. Collaborative nurse notified that she needs follow up here on Friday.

## 2015-01-30 ENCOUNTER — Encounter: Payer: Medicare Other | Admitting: Vascular Surgery

## 2015-01-31 ENCOUNTER — Telehealth: Payer: Self-pay | Admitting: *Deleted

## 2015-01-31 LAB — CULTURE, BLOOD (ROUTINE X 2)
Culture: NO GROWTH
Culture: NO GROWTH

## 2015-01-31 NOTE — Telephone Encounter (Signed)
Oncology Nurse Navigator Documentation  Oncology Nurse Navigator Flowsheets 01/31/2015  Referral date to RadOnc/MedOnc -  Navigator Encounter Type Telephone 1 week F/U  Patient Visit Type   Treatment Phase S/P hospital admission  Barriers/Navigation Needs Financial-  Support Groups/Services Requested case of Ensure/Boost from nutrition for her visit on 6/17  Time Spent with Patient 10  Leon reports she is still in pain-has not been able to afford the MS Contin yet, but is picking it up today. Has plenty of supplies for her dressing changes now. Has not contacted Dr. Biagio Borg office yet for follow up. HHN has been to home, but she has not had PT come yet. Will send message to Dr. Grandville Silos that she needs follow up.

## 2015-02-01 ENCOUNTER — Other Ambulatory Visit: Payer: Self-pay | Admitting: *Deleted

## 2015-02-01 DIAGNOSIS — C189 Malignant neoplasm of colon, unspecified: Secondary | ICD-10-CM

## 2015-02-02 ENCOUNTER — Other Ambulatory Visit: Payer: Self-pay | Admitting: *Deleted

## 2015-02-02 ENCOUNTER — Other Ambulatory Visit (HOSPITAL_BASED_OUTPATIENT_CLINIC_OR_DEPARTMENT_OTHER): Payer: Self-pay

## 2015-02-02 ENCOUNTER — Ambulatory Visit (HOSPITAL_BASED_OUTPATIENT_CLINIC_OR_DEPARTMENT_OTHER): Payer: Self-pay | Admitting: Hematology

## 2015-02-02 ENCOUNTER — Telehealth: Payer: Self-pay | Admitting: Hematology

## 2015-02-02 VITALS — BP 110/62 | HR 105 | Temp 97.6°F | Resp 18 | Ht 71.0 in

## 2015-02-02 DIAGNOSIS — R630 Anorexia: Secondary | ICD-10-CM

## 2015-02-02 DIAGNOSIS — C189 Malignant neoplasm of colon, unspecified: Secondary | ICD-10-CM

## 2015-02-02 DIAGNOSIS — I8290 Acute embolism and thrombosis of unspecified vein: Secondary | ICD-10-CM

## 2015-02-02 DIAGNOSIS — R300 Dysuria: Secondary | ICD-10-CM

## 2015-02-02 DIAGNOSIS — I998 Other disorder of circulatory system: Secondary | ICD-10-CM

## 2015-02-02 DIAGNOSIS — C7972 Secondary malignant neoplasm of left adrenal gland: Secondary | ICD-10-CM

## 2015-02-02 DIAGNOSIS — R634 Abnormal weight loss: Secondary | ICD-10-CM

## 2015-02-02 DIAGNOSIS — E039 Hypothyroidism, unspecified: Secondary | ICD-10-CM

## 2015-02-02 DIAGNOSIS — G893 Neoplasm related pain (acute) (chronic): Secondary | ICD-10-CM

## 2015-02-02 DIAGNOSIS — C185 Malignant neoplasm of splenic flexure: Secondary | ICD-10-CM

## 2015-02-02 DIAGNOSIS — C786 Secondary malignant neoplasm of retroperitoneum and peritoneum: Secondary | ICD-10-CM

## 2015-02-02 LAB — CBC WITH DIFFERENTIAL/PLATELET
BASO%: 1.2 % (ref 0.0–2.0)
Basophils Absolute: 0.1 10*3/uL (ref 0.0–0.1)
EOS%: 1.8 % (ref 0.0–7.0)
Eosinophils Absolute: 0.1 10*3/uL (ref 0.0–0.5)
HCT: 32.7 % — ABNORMAL LOW (ref 34.8–46.6)
HGB: 10.4 g/dL — ABNORMAL LOW (ref 11.6–15.9)
LYMPH#: 1.7 10*3/uL (ref 0.9–3.3)
LYMPH%: 20.6 % (ref 14.0–49.7)
MCH: 27.2 pg (ref 25.1–34.0)
MCHC: 31.9 g/dL (ref 31.5–36.0)
MCV: 85.4 fL (ref 79.5–101.0)
MONO#: 0.6 10*3/uL (ref 0.1–0.9)
MONO%: 7.9 % (ref 0.0–14.0)
NEUT#: 5.5 10*3/uL (ref 1.5–6.5)
NEUT%: 68.5 % (ref 38.4–76.8)
Platelets: 489 10*3/uL — ABNORMAL HIGH (ref 145–400)
RBC: 3.82 10*6/uL (ref 3.70–5.45)
RDW: 21.8 % — ABNORMAL HIGH (ref 11.2–14.5)
WBC: 8 10*3/uL (ref 3.9–10.3)

## 2015-02-02 LAB — COMPREHENSIVE METABOLIC PANEL (CC13)
ALBUMIN: 2.5 g/dL — AB (ref 3.5–5.0)
ALK PHOS: 150 U/L (ref 40–150)
ALT: 10 U/L (ref 0–55)
AST: 16 U/L (ref 5–34)
Anion Gap: 9 mEq/L (ref 3–11)
BUN: 11.5 mg/dL (ref 7.0–26.0)
CO2: 29 mEq/L (ref 22–29)
Calcium: 9.4 mg/dL (ref 8.4–10.4)
Chloride: 105 mEq/L (ref 98–109)
Creatinine: 0.8 mg/dL (ref 0.6–1.1)
EGFR: 75 mL/min/{1.73_m2} — ABNORMAL LOW (ref >90)
Glucose: 127 mg/dl (ref 70–140)
POTASSIUM: 3.9 meq/L (ref 3.5–5.1)
SODIUM: 143 meq/L (ref 136–145)
Total Bilirubin: 0.35 mg/dL (ref 0.20–1.20)
Total Protein: 6.9 g/dL (ref 6.4–8.3)

## 2015-02-02 MED ORDER — SULFAMETHOXAZOLE-TRIMETHOPRIM 800-160 MG PO TABS: 1.0000 | ORAL_TABLET | Freq: Two times a day (BID) | ORAL | Status: DC

## 2015-02-02 MED ORDER — MORPHINE SULFATE 15 MG PO TABS: 15.0000 mg | ORAL_TABLET | ORAL | Status: AC | PRN

## 2015-02-02 MED ORDER — WARFARIN SODIUM 5 MG PO TABS: 5.0000 mg | ORAL_TABLET | Freq: Every day | ORAL | Status: AC

## 2015-02-02 MED ORDER — LORAZEPAM 0.5 MG PO TABS: 0.5000 mg | ORAL_TABLET | Freq: Three times a day (TID) | ORAL | Status: AC

## 2015-02-02 MED ORDER — WARFARIN SODIUM 5 MG PO TABS: 5.0000 mg | ORAL_TABLET | Freq: Every day | ORAL | Status: DC

## 2015-02-02 NOTE — [facility] Oncology Navigator Note (Signed)
Oncology Nurse Navigator Documentation  Oncology Nurse Navigator Flowsheets 02/02/2015  Referral date to RadOnc/MedOnc -  Navigator Encounter Type Hospital F/U  Patient Visit Type Medonc  Treatment Phase Treatment planning  Barriers/Navigation Needs Financial-Medicaid pending; gave her case of Ensure Plus that dietician brought for her. Education-Discussed new med pembrolizumab with patient and provided handout  Referrals Nutrition/dietician  Support Groups/Services Encouraged patient and daughter with how much better she looks today. MD will transition to Coumadin via clinic due to cost of Lovenox.   Specialty Items/DME [place] supplies-provided with colostomy bags to use until her shipment comes in (only has #2 left).  Time Spent with Patient 15  Sent message to managed care to work on [place] for [facility] [facility] asap. Made nutrition referral. Confirmed with patient they do have an appointment to see Dr. Grandville Silos now.

## 2015-02-02 NOTE — Telephone Encounter (Signed)
Gave pt AVS, nothing in the POF to schedule... KJ

## 2015-02-04 ENCOUNTER — Encounter: Payer: Self-pay | Admitting: Hematology

## 2015-02-04 NOTE — Progress Notes (Signed)
[facility]  [phone] [phone]  Clinic New Consult Note   Patient Care Team: No Pcp Per Patient as PCP - General (General Practice) 02/04/2015  CHIEF COMPLAINTs:  Follow up metastatic colon cancer  HISTORY OF PRESENTING ILLNESS:  Kathryn Knapp 74 y.o. female is here because of recently diagnosed metastatic colon cancer. I saw her initially in the hospital about a months ago, she is here for follow-up after hospital discharge.  She was previously very active and independent until 12/16/2014 when was admitted to [facility] for abdominal pain nausea and vomiting. CT scan showed a perforated colon mass at splenic flexure with peritoneal carcinomatosis and a left adrenal mass. She  Underwent emergent surgery with exploratory laparotomy, right transverse and partial left colectomy with gastrectomy and ileostomy per Dr. Violeta Gelinas.Her hospital course was complicated by respiratory failure, septic shock, acute renal failure, and the right LE ischemia requided below-knee amputation. She recovered from the acute conversations, and was discharged to a nursing home after prolonged hospital stay.  During her stay at the rehabilitation, she was able to do some rehabilitation, mainly in the wheelchair, able to transfer from chair to bathroom. She was finally released from rehabilitation and went home a week ago. She has been having left abdominal pain and left shoulder pain since hospital discharge, was intermittent, and positional, but it became constant for the past 3 days, stool output is about the same, (+) nausea, no vomiting, appettite is low since the surgery, eats very little, no fever or chills. She actually called to request urgent appointment today due to her worsening abdominal pain.   INTERIM HISTORY Kathryn Knapp returns for follow-up. She was admitted to Hospital from my office about a week ago, and restaging CT scan showed significant disease  progression. She was treated for UTI, and her pain was managed by pain medication. She is a home with her daughter now. She stated her pain is still moderate to severe, extensive release morphine does not seem to help much. She take morphine IR every 4 hours. She also has persistent dysuria, no fever or chills. Her appetite is also poor, she eats small amount of meal and drinks adequately.    MEDICAL HISTORY:  Past Medical History  Diagnosis Date  . Diabetes mellitus without complication   . Hypertension   . GERD (gastroesophageal reflux disease)   . Depression   . Colon cancer metastasized to multiple sites     SURGICAL HISTORY: Past Surgical History  Procedure Laterality Date  . Cholecystectomy    . Abdominal hysterectomy    . Laparotomy N/A 12/17/2014    Procedure: EXPLORATORY LAPAROTOMY Right, Transverse, partial left colectomy with en bloc partial gastrectomy illeostomy;  Surgeon: Violeta Gelinas, MD;  Location: [facility] OR;  Service: General;  Laterality: N/A;  . Amputation Right 12/28/2014    Procedure: AMPUTATION BELOW KNEE;  Surgeon: Fransisco Hertz, MD;  Location: [facility] OR;  Service: Vascular;  Laterality: Right;    SOCIAL HISTORY: History   Social History  . Marital Status: Divorced    Spouse Name: N/A  . Number of Children: N/A  . Years of Education: N/A   Occupational History  . Not on file.   Social History Main Topics  . Smoking status: Never Smoker   . Smokeless tobacco: Not on file  . Alcohol Use: No  . Drug Use: No  . Sexual Activity: Not on file   Other Topics Concern  . Not on file  Social History Narrative    FAMILY HISTORY: Family History  Problem Relation Age of Onset  . Pneumonia Brother   . Heart attack Sister     ALLERGIES:  has No Known Allergies.  MEDICATIONS:  Current Outpatient Prescriptions  Medication Sig Dispense Refill  . acetaminophen (TYLENOL) 325 MG tablet Take 1-2 tablets (325-650 mg total) by mouth every 4 (four) hours as needed for  mild pain (or temp >/= 101 F).    . docusate sodium (COLACE) 100 MG capsule Take 1 capsule (100 mg total) by mouth daily. 10 capsule 0  . enoxaparin (LOVENOX) 30 MG/0.3ML injection Inject 0.9 mLs (90 mg total) into the skin daily. 30 Syringe 1  . feeding supplement, ENSURE ENLIVE, (ENSURE ENLIVE) LIQD Take 237 mLs by mouth 2 (two) times daily between meals. 237 mL 12  . FLUoxetine (PROZAC) 20 MG capsule Take 1 capsule (20 mg total) by mouth daily. 30 capsule 3  . gabapentin (NEURONTIN) 100 MG capsule Take 1 capsule (100 mg total) by mouth 3 (three) times daily. 90 capsule 1  . levofloxacin (LEVAQUIN) 500 MG tablet Take 1 tablet (500 mg total) by mouth daily. 3 tablet 0  . LORazepam (ATIVAN) 0.5 MG tablet Take 1-2 tablets (0.5-1 mg total) by mouth every 8 (eight) hours. 30 tablet 0  . morphine (MS CONTIN) 15 MG 12 hr tablet Take 1 tablet (15 mg total) by mouth every 12 (twelve) hours. 60 tablet 0  . morphine (MSIR) 15 MG tablet Take 1 tablet (15 mg total) by mouth every 4 (four) hours as needed for severe pain. 120 tablet 0  . pantoprazole (PROTONIX) 40 MG tablet Take 1 tablet (40 mg total) by mouth daily. 30 tablet 1  . methocarbamol (ROBAXIN) 500 MG tablet Take 1 tablet (500 mg total) by mouth every 6 (six) hours as needed for muscle spasms. (Patient not taking: Reported on 02/02/2015) 90 tablet 0  . sulfamethoxazole-trimethoprim (BACTRIM DS,SEPTRA DS) 800-160 MG per tablet Take 1 tablet by mouth 2 (two) times daily. 10 tablet 0  . warfarin (COUMADIN) 5 MG tablet Take 1 tablet (5 mg total) by mouth daily. 30 tablet 0   No current facility-administered medications for this visit.    REVIEW OF SYSTEMS:   Constitutional: Denies fevers, chills or abnormal night sweats, (+) fatigue Eyes: Denies blurriness of vision, double vision or watery eyes Ears, nose, mouth, throat, and face: Denies mucositis or sore throat Respiratory: Denies cough, dyspnea or wheezes Cardiovascular: Denies palpitation, chest  discomfort or lower extremity swelling Gastrointestinal:  See HPI  Skin: Denies abnormal skin rashes Lymphatics: Denies new lymphadenopathy or easy bruising Neurological:Denies numbness, tingling or new weaknesses Behavioral/Psych: Mood is stable, no new changes  All other systems were reviewed with the patient and are negative.  PHYSICAL EXAMINATION: ECOG PERFORMANCE STATUS: 3  Filed Vitals:   02/02/15 1528  BP: 110/62  Pulse: 105  Temp: 97.6 F (36.4 C)  Resp: 18   Filed Weights    GENERAL:alert, no distress and comfortable, cachatic and chronically ill-appearing SKIN: skin color, texture, turgor are normal, no rashes or significant lesions EYES: normal, conjunctiva are pink and non-injected, sclera clear OROPHARYNX:no exudate, no erythema and lips, buccal mucosa, and tongue normal  NECK: supple, thyroid normal size, non-tender, without nodularity LYMPH:  no palpable lymphadenopathy in the cervical, axillary or inguinal LUNGS: clear to auscultation and percussion with normal breathing effort on right, significant decrease of breath sound on the left upper to mid chest.  HEART: regular rate &  rhythm and no murmurs and no lower extremity edema ABDOMEN:abdomen soft, non-tender and normal bowel sounds Musculoskeletal:no cyanosis of digits and no clubbing, rtight LE BKA PSYCH: alert & oriented x 3 with fluent speech NEURO: no focal motor/sensory deficits  LABORATORY DATA:  I have reviewed the data as listed Lab Results  Component Value Date   WBC 8.0 02/02/2015   HGB 10.4* 02/02/2015   HCT 32.7* 02/02/2015   MCV 85.4 02/02/2015   PLT 489* 02/02/2015    Recent Labs  01/05/15 1152  01/15/15 0621 01/24/15 1814 01/25/15 0148 02/02/15 1510  NA 139  --   --  138 137 143  K 4.5  --   --  4.0 3.6 3.9  CL 101  --   --  101 104  --   CO2 31  --   --  $R'26 25 29  'ZY$ GLUCOSE 121*  --   --  110* 95 127  BUN 7  --   --  19 15 11.5  CREATININE 0.87  < > 0.67 0.73 0.56 0.8    CALCIUM 7.7*  --   --  9.3 8.5* 9.4  GFRNONAA >60  < > >60 >60 >60  --   GFRAA >60  < > >60 >60 >60  --   PROT  --   --   --  7.6 6.3* 6.9  ALBUMIN  --   --   --  2.8* 2.4* 2.5*  AST  --   --   --  $R'24 19 16  'ja$ ALT  --   --   --  16 12* 10  ALKPHOS  --   --   --  175* 146* 150  BILITOT  --   --   --  0.5 0.5 0.35  < > = values in this interval not displayed.  Pathology reports: Diagnosis 12/17/2014 Colon, segmental resection for tumor, right, transverse, partial left, and portion of stomach - PERFORATED WELL TO MODERATELY DIFFERENTIATED ADENOCARCINOMA, SPANNING 13 CM IN GREATEST DIMENSION. - ADENOCARCINOMA INVADES THROUGH BOWEL WALL TO INVOLVE THE SEROSA OF THE STOMACH. - MESENTERIC MARGIN IS GROSSLY INVOLVED WITH TUMOR. - PROXIMAL AND DISTAL COLONIC MARGINS DEMONSTRATE ACUTE SEROSITIS, BUT ARE NEGATIVE FOR TUMOR. - STOMACH MARGIN IS NEGATIVE FOR TUMOR. - LYMPH/VASCULAR INVASION IS PRESENT WITH 6 OF 13 LYMPH NODES POSITIVE FOR METASTATIC ADENOCARCINOMA (6/13). - EXTRACAPSULAR EXTENSION IS IDENTIFIED. - MULTIPLE (GREATER THAN 5) Microscopic Comment COLON AND RECTUM: Specimen: Right, transverse, and left colon with portion of terminal ileum, and attached portion of stomach. Procedure: Right, transverse, and partial left colectomy with en-bloc partial gastrectomy. Tumor site: Distal transverse/proximal left colon. Specimen integrity: The specimen is disrupted / perforated in the area of tumor, 7 cm from the distal margin. Macroscopic tumor perforation: Present. Invasive tumor: Maximum size: 13 cm. Histologic type(s): Adenocarcinoma. Histologic grade and differentiation: G1 - G2: well to moderately differentiated/low grade. Type of polyp in which invasive carcinoma arose: A precursor polyp is not identified. Microscopic extension of invasive tumor: Tumor invades through bowel wall to involve the serosa surface of the stomach. Lymph-Vascular invasion: Yes, present. Peri-neural  invasion: Not identified. Tumor deposit(s) (discontinuous extramural extension): Yes, multiple tumor deposits in the omentum. Resection margins: Proximal colonic margin: Negative. Distal colonic margin: Negative. Stomach margin: Negative. Mesenteric margin (sigmoid and transverse): Grossly positive. Distance closest margin (if all above margins negative): Mesenteric margin is grossly positive. Treatment effect (neo-adjuvant therapy): Not applicable. Additional polyp(s): None identified. Additional findings: Acute serositis involves the proximal and  distal colonic margins (likely due to tumor rupture). Lymph nodes: number examined 13; number positive: 6. Pathologic Staging: pT4b, pN2a. Ancillary studies: The tumor will be sent for MSI testing by PCR and MMR by [facility] per [facility] Working Group protocol. (RH:ds 12/19/14)  ADDITIONAL INFORMATION: Mismatch Repair (MMR) Protein Immunohistochemistry (IHC) IHC Expression Result: MLH1: LOSS OF NUCLEAR EXPRESSION (LESS THAN 5% TUMOR EXPRESSION) MSH2: Preserved nuclear expression (greater 50% tumor expression) MSH6: Preserved nuclear expression (greater 50% tumor expression) PMS2: LOSS OF NUCLEAR EXPRESSION (LESS THAN 5% TUMOR EXPRESSION) * Internal control demonstrates intact nuclear expression  MSI-high  BRAF V600E1 (+)  RADIOGRAPHIC STUDIES: I have personally reviewed the radiological images as listed and agreed with the findings in the report. Ct Chest, Abdomen Pelvis W contrast 12/17/2014 IMPRESSION: 1. Large amount of free air and free fluid within the upper abdomen follow-up rarely about the liver, compatible with acute perforation. This likely reflects perforation at the level of the large colonic malignancy, though the site of perforation is not definitely characterized on this study. 2. Large left splenic flexure colonic mass again noted, with diffuse surrounding nodularity, reflecting local spread of disease. Extensive  surrounding soft tissue inflammation is mildly more prominent than on the prior study. 3. Small left pleural effusion, new from the recent prior study, likely reactive secondary to bowel perforation. Bibasilar atelectasis noted. Lungs otherwise clear. 4. Scattered coronary artery calcifications seen. 5. Small pericardial effusion again noted. 6. 2.5 cm left adrenal lesion again raises concern for metastasis.  CT abdomen and pelvis with contrast on 01/24/2015 IMPRESSION: 1. Ill-defined soft tissue within the left upper abdominal quadrant worrisome for residual and/or locally recurrent disease with dominant residual soft tissue measuring approximately 10.4 cm in diameter. 2. Findings compatible with progression of metastatic disease with worsening retroperitoneal, porta-hepatis and mesenteric adenopathy and interval increase in size of suspected left adrenal gland metastasis. 3. Interval development of an approximately 1.3 cm liver lesion compatible with hepatic metastatic disease. 4. Post hemicolectomy and end ileostomy creation without evidence of enteric obstruction. Note of definable/drainable intra-abdominal fluid collection. 5. Persistent open midline abdominal wound without associated hernia.   ASSESSMENT & PLAN:  74 year old female   1. Metastatic colon cancer, [id number], stage IV, with peritoneal, liver and left adrenal metastasis. MSI-H, BRAF V600E1 (+)  -she has sporadic Lynch syndrome,  based on her MS I and BRAFmutation test results.  -I reviewed her scan and surgical path findings. Unfortunately she has metastatic disease, not curable, and overall prognosis is extremly poor, due to her severe complications after surgery, poor performance status and the rapid disease progression.  -We discussed the option of chemotherapy, given her poor performance status , she not a good candidate for chemotherapy.  -We discussed the alternative option of immunotherapy. Studies have  shown and good response to PDL 1 inhibitor in patients with MSI high colon cancer. Pembrolizumab was approved by her insurance. She expressed her wish to try, we'll start her on next week. -Potential side effects from [facility], such as colitis, hypothyroidism, pneumonitis, and other endocrine disorders, we'll discussed with patient and her daughters. She voiced good understanding, consent was obtained.  2. Worsening left abdominal pain, secondary to her metastatic disease -I'll increase her morphine ER to 30 milligrams every 12 hours -Continue morphine IR $RemoveBef'15mg'DeSNYMREzp$  every 4 hours  3. Right foot ischemia, status post BKA -She is running out of Lovenox, I give her some sample from our pharmacy today. -I'll likely change her to Coumadin next week.  4. Anorexia,  weight loss -Dietitian consult, social work referral -We'll give her free sample of Ensure today  5. Probable left pleural effusion -Continue monitoring.  6. UTI -She has persistent dysuria, given the course of Bactrim    Plan: -I'll schedule her first Pembro infusion for next Tuesday  -I'll see her back one week after first infusion.  Orders Placed This Encounter  Procedures  . Ambulatory referral to Nutrition and Diabetic E    Referral Priority:  Routine    Referral Type:  Consultation    Referral Reason:  Specialty Services Required    Number of Visits Requested:  1    All questions were answered. The patient knows to call the clinic with any problems, questions or concerns. I spent 30 minutes counseling the patient face to face. The total time spent in the appointment was 40 minutes and more than 50% was on counseling.     Truitt Merle, MD 02/04/2015 8:57 AM

## 2015-02-05 ENCOUNTER — Other Ambulatory Visit: Payer: Self-pay | Admitting: *Deleted

## 2015-02-05 ENCOUNTER — Other Ambulatory Visit: Payer: Self-pay

## 2015-02-05 ENCOUNTER — Telehealth: Payer: Self-pay | Admitting: Hematology

## 2015-02-05 ENCOUNTER — Telehealth: Payer: Self-pay | Admitting: *Deleted

## 2015-02-05 NOTE — Telephone Encounter (Signed)
Per desk RN the chemotherapy is approved and I have rescheduled appts to tomorrow. Desk RN called family

## 2015-02-05 NOTE — Telephone Encounter (Signed)
Spoke with daughter Laurina Bustle, and gave her pt's appts for 02/06/15 to start new chemo. Per [name], she thinks pt had started Coumadin last Fri 02/02/15 , and still taking Lovenox injections as instructed by Dr. Burr Medico at last office visit. Katie, pharmD notified since pt will be followed by coumadin clinic. Regina's  Phone     [phone].

## 2015-02-05 NOTE — Telephone Encounter (Signed)
s.w. pt dtr and confirmed appts...ok and aware

## 2015-02-06 ENCOUNTER — Encounter: Payer: Self-pay | Admitting: Hematology

## 2015-02-06 ENCOUNTER — Ambulatory Visit (HOSPITAL_BASED_OUTPATIENT_CLINIC_OR_DEPARTMENT_OTHER): Payer: Self-pay | Admitting: Hematology

## 2015-02-06 ENCOUNTER — Other Ambulatory Visit (HOSPITAL_BASED_OUTPATIENT_CLINIC_OR_DEPARTMENT_OTHER): Payer: Self-pay

## 2015-02-06 ENCOUNTER — Other Ambulatory Visit (HOSPITAL_COMMUNITY)
Admission: RE | Admit: 2015-02-06 | Discharge: 2015-02-06 | Disposition: A | Discharge status: Home / Self Care | Payer: Medicare Other | Source: Ambulatory Visit | Attending: Hematology | Admitting: Hematology

## 2015-02-06 ENCOUNTER — Telehealth: Payer: Self-pay | Admitting: Hematology

## 2015-02-06 ENCOUNTER — Other Ambulatory Visit: Payer: Medicare Other

## 2015-02-06 ENCOUNTER — Ambulatory Visit (HOSPITAL_BASED_OUTPATIENT_CLINIC_OR_DEPARTMENT_OTHER): Payer: Self-pay

## 2015-02-06 ENCOUNTER — Other Ambulatory Visit: Payer: Self-pay | Admitting: *Deleted

## 2015-02-06 ENCOUNTER — Ambulatory Visit: Payer: Medicare Other

## 2015-02-06 ENCOUNTER — Encounter: Payer: Self-pay | Admitting: *Deleted

## 2015-02-06 VITALS — BP 134/66 | HR 101 | Temp 98.6°F | Resp 18

## 2015-02-06 DIAGNOSIS — R630 Anorexia: Secondary | ICD-10-CM

## 2015-02-06 DIAGNOSIS — C786 Secondary malignant neoplasm of retroperitoneum and peritoneum: Secondary | ICD-10-CM

## 2015-02-06 DIAGNOSIS — C7972 Secondary malignant neoplasm of left adrenal gland: Secondary | ICD-10-CM

## 2015-02-06 DIAGNOSIS — C189 Malignant neoplasm of colon, unspecified: Secondary | ICD-10-CM

## 2015-02-06 DIAGNOSIS — C185 Malignant neoplasm of splenic flexure: Secondary | ICD-10-CM

## 2015-02-06 DIAGNOSIS — Z5112 Encounter for antineoplastic immunotherapy: Secondary | ICD-10-CM

## 2015-02-06 DIAGNOSIS — R634 Abnormal weight loss: Secondary | ICD-10-CM

## 2015-02-06 DIAGNOSIS — Z7901 Long term (current) use of anticoagulants: Secondary | ICD-10-CM | POA: Insufficient documentation

## 2015-02-06 DIAGNOSIS — G893 Neoplasm related pain (acute) (chronic): Secondary | ICD-10-CM

## 2015-02-06 DIAGNOSIS — I998 Other disorder of circulatory system: Secondary | ICD-10-CM

## 2015-02-06 DIAGNOSIS — I8290 Acute embolism and thrombosis of unspecified vein: Secondary | ICD-10-CM

## 2015-02-06 DIAGNOSIS — E039 Hypothyroidism, unspecified: Secondary | ICD-10-CM

## 2015-02-06 LAB — CBC WITH DIFFERENTIAL/PLATELET
BASO%: 1.1 % (ref 0.0–2.0)
Basophils Absolute: 0.1 10*3/uL (ref 0.0–0.1)
EOS ABS: 0.1 10*3/uL (ref 0.0–0.5)
EOS%: 1 % (ref 0.0–7.0)
HEMATOCRIT: 35.8 % (ref 34.8–46.6)
HGB: 11.3 g/dL — ABNORMAL LOW (ref 11.6–15.9)
LYMPH%: 14.9 % (ref 14.0–49.7)
MCH: 27 pg (ref 25.1–34.0)
MCHC: 31.5 g/dL (ref 31.5–36.0)
MCV: 85.8 fL (ref 79.5–101.0)
MONO#: 0.6 10*3/uL (ref 0.1–0.9)
MONO%: 6 % (ref 0.0–14.0)
NEUT%: 77 % — ABNORMAL HIGH (ref 38.4–76.8)
NEUTROS ABS: 7.4 10*3/uL — AB (ref 1.5–6.5)
Platelets: 483 10*3/uL — ABNORMAL HIGH (ref 145–400)
RBC: 4.17 10*6/uL (ref 3.70–5.45)
RDW: 21.5 % — ABNORMAL HIGH (ref 11.2–14.5)
WBC: 9.6 10*3/uL (ref 3.9–10.3)
lymph#: 1.4 10*3/uL (ref 0.9–3.3)

## 2015-02-06 LAB — PROTIME-INR
INR: 3.33 — ABNORMAL HIGH (ref 0.00–1.49)
Prothrombin Time: 33.1 seconds — ABNORMAL HIGH (ref 11.6–15.2)

## 2015-02-06 LAB — COMPREHENSIVE METABOLIC PANEL (CC13)
ALT: 11 U/L (ref 0–55)
ANION GAP: 9 meq/L (ref 3–11)
AST: 19 U/L (ref 5–34)
Albumin: 2.8 g/dL — ABNORMAL LOW (ref 3.5–5.0)
Alkaline Phosphatase: 146 U/L (ref 40–150)
BUN: 22.4 mg/dL (ref 7.0–26.0)
CO2: 26 meq/L (ref 22–29)
CREATININE: 1.5 mg/dL — AB (ref 0.6–1.1)
Calcium: 9.5 mg/dL (ref 8.4–10.4)
Chloride: 103 mEq/L (ref 98–109)
EGFR: 35 mL/min/{1.73_m2} — AB (ref >90)
Glucose: 150 mg/dl — ABNORMAL HIGH (ref 70–140)
Potassium: 4.4 mEq/L (ref 3.5–5.1)
Sodium: 138 mEq/L (ref 136–145)
Total Bilirubin: 0.23 mg/dL (ref 0.20–1.20)
Total Protein: 7.5 g/dL (ref 6.4–8.3)

## 2015-02-06 LAB — TSH CHCC: TSH: 6.074 m[IU]/L — AB (ref 0.308–3.960)

## 2015-02-06 LAB — POCT INR: INR: 3.3

## 2015-02-06 MED ORDER — SODIUM CHLORIDE 0.9 % IV SOLN
Freq: Once | INTRAVENOUS | Status: AC
  Administered 2015-02-06: 15:00:00 via INTRAVENOUS

## 2015-02-06 MED ORDER — PROCHLORPERAZINE MALEATE 10 MG PO TABS
10.0000 mg | ORAL_TABLET | Freq: Once | ORAL | Status: AC
  Administered 2015-02-06: 10 mg via ORAL

## 2015-02-06 MED ORDER — SODIUM CHLORIDE 0.9 % IV SOLN
2.0000 mg/kg | Freq: Once | INTRAVENOUS | Status: AC
  Administered 2015-02-06: 125 mg via INTRAVENOUS
  Filled 2015-02-06: qty 5

## 2015-02-06 MED ORDER — MORPHINE SULFATE 4 MG/ML IJ SOLN
4.0000 mg | Freq: Once | INTRAMUSCULAR | Status: AC
  Administered 2015-02-06: 4 mg via INTRAVENOUS

## 2015-02-06 MED ORDER — PROCHLORPERAZINE MALEATE 10 MG PO TABS
ORAL_TABLET | ORAL | Status: AC
  Filled 2015-02-06: qty 1

## 2015-02-06 MED ORDER — PROCHLORPERAZINE MALEATE 10 MG PO TABS: 10.0000 mg | ORAL_TABLET | Freq: Three times a day (TID) | ORAL | Status: AC | PRN

## 2015-02-06 MED ORDER — MORPHINE SULFATE 4 MG/ML IJ SOLN
INTRAMUSCULAR | Status: AC
  Filled 2015-02-06: qty 1

## 2015-02-06 NOTE — Telephone Encounter (Signed)
per Marolyn Hammock to sch CC-pt aware

## 2015-02-06 NOTE — Telephone Encounter (Signed)
pt daughter cld about appts sch for today. Wanted to know why changed. Adv per sch ot was sch for today. Gave ALL times for appts

## 2015-02-06 NOTE — Progress Notes (Signed)
Ms. Czerwonka send out INR was 3.3 today which is slightly above her goal range of 2-3. She has been taking 5 mg daily since Friday, and has already taken today's dose (therefore she has had ~ 5 days of Coumadin). She reports no missed and/or extra doses. No bleeding and/or unusual bruising except for when we picked at a piece of dry skin on her lip which bled a lot per her report, though the bleeding stopped with compression and no other interventions. This happened the day after she started Coumadin, though she was also on Lovenox at the time.  No recent medication changes, though on the same day she was started on warfarin she was also started on a 10 day course of Bactrim which can interfere with Coumadin and elevate the INR.  She says she does not eat a lot of the foods that contain vitamin K that we had discussed, and currently her appetite is fairly poor as she is often nauseous.   Though we typically would not have expected her INR to rise this quickly, this is likely due to a couple of things - the combination of warfarin and Bactrim likely played a role, as well as her poor appetite overall.   We discussed the folowing information about warfarin: what warfarin is, how it works, the importance of taking it every day, possible side effects including but not limited to bleeding, the importance of calling her physician if she has a bleeding episode or has a fall, the nuances of dosing, why we monitor INR, her goal INR range, dietary interactions, potential medication interactions, etc.   Given that her INR rose quickly, that she still remains on Bactrim for a few more days, and that we likely have not seen the full effects of the previous doses of warfarin on the INR, I will decrease her weekly dose a little.   Of note, Dr. Burr Medico plans to have a port placed eventually, and her Coumadin therapy will likely need to be held around that procedure.   Plan: -Stop Lovenox injections -Change Coumadin to  2.5 mg MWF, 5 mg all other days. -Return to Coumadin Clinic 6/28: 12:30 pm for lab, 1:00 pm for Dr Burr Medico, and 1:15 pm for Coumadin Clinic

## 2015-02-06 NOTE — Progress Notes (Signed)
[place]  Telephone:(336) [phone] Fax:([phone]) [place] Note   Patient Care Team: No Pcp Per Patient as PCP - General (General Practice) 02/06/2015  CHIEF COMPLAINTs:  Follow up metastatic colon cancer  Oncology History   Colon cancer metastasized to multiple sites   Staging form: Neuroendocrine Tumor - Colon/Rectum, AJCC 7th Edition     Clinical: Stage IV (T4, N1, M1) - Signed by Truitt Merle, MD on 01/24/2015       Colon cancer metastasized to multiple sites   12/17/2014 Initial Diagnosis Colon cancer metastasized to multiple sites   12/17/2014 Imaging CT chest, abdomen and pelvis showed large left splenic flexure chronic mass, with diffuse surrounding nodularity, reflecting local spread of disease. Small left pleural effusion, 2.5 cm left adrenal gland mass concerning for metastasis.   12/17/2014 Surgery Segmental resection of right, transverse, partial left colon, and a portion of stomach, mesenteric margins were grossly positive.   12/17/2014 Pathology Results Perforated well to moderately differentiated adenocarcinoma, spanning 13 cm in greatest dimension, 6 out of 13 lymph nodes were positive, mesenteric margins were grossly positive.   12/17/2014 Miscellaneous Tumor cells IHC showed loss of ML H1 and p.m. S2. MSI-high by PCR, BRAF V600E1 mutation positive, supports sporadic Lynch syndrome.   02/06/2015 -  Chemotherapy pembrolizumab 44m/kg, every 2 weeks    HISTORY OF PRESENTING ILLNESS:  MLYNNSIE LINDERS74y.o. female is here because of recently diagnosed metastatic colon cancer. I saw her initially in the hospital about a months ago, she is here for follow-up after hospital discharge.  She was previously very active and independent until [facility] scan showed a perforated colon mass at splenic flexure with peritoneal carcinomatosis and a left adrenal mass. She  Underwent  emergent surgery with exploratory laparotomy, right transverse and partial left colectomy with gastrectomy and ileostomy per Dr. BGeorganna SkeansHer hospital course was complicated by respiratory failure, septic shock, acute renal failure, and the right LE ischemia requided below-knee amputation. She recovered from the acute conversations, and was discharged to a nursing home after prolonged hospital stay.  During her stay at the rehabilitation, she was able to do some rehabilitation, mainly in the wheelchair, able to transfer from chair to bathroom. She was finally released from rehabilitation and went home a week ago. She has been having left abdominal pain and left shoulder pain since hospital discharge, was intermittent, and positional, but it became constant for the past 3 days, stool output is about the same, (+) nausea, no vomiting, appettite is low since the surgery, eats very little, no fever or chills. She actually called to request urgent appointment today due to her worsening abdominal pain.   INTERIM HISTORY MHart Carwinreturns for follow-up and first cycle pembrolizumab. Her abdominal pain is better controlled with increased MS Contin, she did not have much pain his morning and did not take MS Contin. She was in moderate to severe pain when she came in for the first treatment. She otherwise feels about same. She was seen at clinic clinic today also.   MEDICAL HISTORY:  Past Medical History  Diagnosis Date  . Diabetes mellitus without complication   . Hypertension   . GERD (gastroesophageal reflux disease)   . Depression   . Colon cancer metastasized to multiple sites     SURGICAL HISTORY: Past Surgical History  Procedure Laterality Date  . Cholecystectomy    . Abdominal hysterectomy    .  Laparotomy N/A 12/17/2014    Procedure: EXPLORATORY LAPAROTOMY Right, Transverse, partial left colectomy with en bloc partial gastrectomy illeostomy;  Surgeon: Georganna Skeans, MD;  Location: [place];   Service: General;  Laterality: N/A;  . Amputation Right 12/28/2014    Procedure: AMPUTATION BELOW KNEE;  Surgeon: Conrad Myrtle Grove, MD;  Location: [facility];  Service: Vascular;  Laterality: Right;    SOCIAL HISTORY: History   Social History  . Marital Status: Divorced    Spouse Name: N/A  . Number of Children: N/A  . Years of Education: N/A   Occupational History  . Not on file.   Social History Main Topics  . Smoking status: Former Smoker -- 2.00 packs/day for 40 years    Types: Cigarettes    Quit date: 11/17/1990  . Smokeless tobacco: Not on file  . Alcohol Use: No  . Drug Use: No  . Sexual Activity: Not on file   Other Topics Concern  . Not on file   Social History Narrative    FAMILY HISTORY: Family History  Problem Relation Age of Onset  . Pneumonia Brother   . Heart attack Sister     ALLERGIES:  has No Known Allergies.  MEDICATIONS:  Current Outpatient Prescriptions  Medication Sig Dispense Refill  . acetaminophen (TYLENOL) 325 MG tablet Take 1-2 tablets (325-650 mg total) by mouth every 4 (four) hours as needed for mild pain (or temp >/= 101 F).    . docusate sodium (COLACE) 100 MG capsule Take 1 capsule (100 mg total) by mouth daily. 10 capsule 0  . enoxaparin (LOVENOX) 30 MG/0.3ML injection Inject 0.9 mLs (90 mg total) into the skin daily. 30 Syringe 1  . feeding supplement, ENSURE ENLIVE, (ENSURE ENLIVE) LIQD Take 237 mLs by mouth 2 (two) times daily between meals. 237 mL 12  . FLUoxetine (PROZAC) 20 MG capsule Take 1 capsule (20 mg total) by mouth daily. 30 capsule 3  . gabapentin (NEURONTIN) 100 MG capsule Take 1 capsule (100 mg total) by mouth 3 (three) times daily. 90 capsule 1  . levofloxacin (LEVAQUIN) 500 MG tablet Take 1 tablet (500 mg total) by mouth daily. 3 tablet 0  . LORazepam (ATIVAN) 0.5 MG tablet Take 1-2 tablets (0.5-1 mg total) by mouth every 8 (eight) hours. 30 tablet 0  . methocarbamol (ROBAXIN) 500 MG tablet Take 1 tablet (500 mg total) by  mouth every 6 (six) hours as needed for muscle spasms. (Patient not taking: Reported on 02/02/2015) 90 tablet 0  . morphine (MS CONTIN) 15 MG 12 hr tablet Take 1 tablet (15 mg total) by mouth every 12 (twelve) hours. 60 tablet 0  . morphine (MSIR) 15 MG tablet Take 1 tablet (15 mg total) by mouth every 4 (four) hours as needed for severe pain. 120 tablet 0  . pantoprazole (PROTONIX) 40 MG tablet Take 1 tablet (40 mg total) by mouth daily. 30 tablet 1  . prochlorperazine (COMPAZINE) 10 MG tablet Take 1 tablet (10 mg total) by mouth every 8 (eight) hours as needed for nausea or vomiting. 20 tablet 0  . sulfamethoxazole-trimethoprim (BACTRIM DS,SEPTRA DS) 800-160 MG per tablet Take 1 tablet by mouth 2 (two) times daily. 10 tablet 0  . warfarin (COUMADIN) 5 MG tablet Take 1 tablet (5 mg total) by mouth daily. 30 tablet 0   No current facility-administered medications for this visit.    REVIEW OF SYSTEMS:   Constitutional: Denies fevers, chills or abnormal night sweats, (+) fatigue Eyes: Denies blurriness of vision,  double vision or watery eyes Ears, nose, mouth, throat, and face: Denies mucositis or sore throat Respiratory: Denies cough, dyspnea or wheezes Cardiovascular: Denies palpitation, chest discomfort or lower extremity swelling Gastrointestinal:  See HPI  Skin: Denies abnormal skin rashes Lymphatics: Denies new lymphadenopathy or easy bruising Neurological:Denies numbness, tingling or new weaknesses Behavioral/Psych: Mood is stable, no new changes  All other systems were reviewed with the patient and are negative.  PHYSICAL EXAMINATION: ECOG PERFORMANCE STATUS: 3  blood pressure 134/66, heart rate 105, respiratory rate 18, temperature 37, pulse ox 100%  GENERAL:alert, no distress and comfortable, cachatic and chronically ill-appearing SKIN: skin color, texture, turgor are normal, no rashes or significant lesions EYES: normal, conjunctiva are pink and non-injected, sclera  clear OROPHARYNX:no exudate, no erythema and lips, buccal mucosa, and tongue normal  NECK: supple, thyroid normal size, non-tender, without nodularity LYMPH:  no palpable lymphadenopathy in the cervical, axillary or inguinal LUNGS: clear to auscultation and percussion with normal breathing effort on right, significant decrease of breath sound on the left upper to mid chest.  HEART: regular rate & rhythm and no murmurs and no lower extremity edema ABDOMEN:abdomen soft, non-tender and normal bowel sounds Musculoskeletal:no cyanosis of digits and no clubbing, rtight LE BKA PSYCH: alert & oriented x 3 with fluent speech NEURO: no focal motor/sensory deficits  LABORATORY DATA:  I have reviewed the data as listed Lab Results  Component Value Date   WBC 9.6 02/06/2015   HGB 11.3* 02/06/2015   HCT 35.8 02/06/2015   MCV 85.8 02/06/2015   PLT 483* 02/06/2015    Recent Labs  01/05/15 1152  01/15/15 0621 01/24/15 1814 01/25/15 0148 02/02/15 1510 02/06/15 1340  NA 139  --   --  138 137 143 138  K 4.5  --   --  4.0 3.6 3.9 4.4  CL 101  --   --  101 104  --   --   CO2 31  --   --  _0 GLUCOSE 121*  --   --  110* 95 127 150*  BUN 7  --   --  19 15 11.5 22.4  CREATININE 0.87  < > 0.67 0.73 0.56 0.8 1.5*  CALCIUM 7.7*  --   --  9.3 8.5* 9.4 9.5  GFRNONAA >60  < > >60 >60 >60  --   --   GFRAA >60  < > >60 >60 >60  --   --   PROT  --   --   --  7.6 6.3* 6.9 7.5  ALBUMIN  --   --   --  2.8* 2.4* 2.5* 2.8*  AST  --   --   --  _1 ALT  --   --   --  16 12* 10 11  ALKPHOS  --   --   --  175* 146* 150 146  BILITOT  --   --   --  0.5 0.5 0.35 0.23  < > = values in this interval not displayed.  Pathology reports: Diagnosis 12/17/2014 Colon, segmental resection for tumor, right, transverse, partial left, and portion of stomach - PERFORATED WELL TO MODERATELY DIFFERENTIATED ADENOCARCINOMA, SPANNING 13 CM IN GREATEST DIMENSION. - ADENOCARCINOMA INVADES THROUGH BOWEL WALL TO  INVOLVE THE SEROSA OF THE STOMACH. - MESENTERIC MARGIN IS GROSSLY INVOLVED WITH TUMOR. - PROXIMAL AND DISTAL COLONIC MARGINS DEMONSTRATE ACUTE SEROSITIS, BUT ARE NEGATIVE FOR TUMOR. - STOMACH MARGIN IS NEGATIVE FOR TUMOR. - LYMPH/VASCULAR  INVASION IS PRESENT WITH 6 OF 13 LYMPH NODES POSITIVE FOR METASTATIC ADENOCARCINOMA (6/13). - EXTRACAPSULAR EXTENSION IS IDENTIFIED. - MULTIPLE (GREATER THAN 5) Microscopic Comment COLON AND RECTUM: Specimen: Right, transverse, and left colon with portion of terminal ileum, and attached portion of stomach. Procedure: Right, transverse, and partial left colectomy with en-bloc partial gastrectomy. Tumor site: Distal transverse/proximal left colon. Specimen integrity: The specimen is disrupted / perforated in the area of tumor, 7 cm from the distal margin. Macroscopic tumor perforation: Present. Invasive tumor: Maximum size: 13 cm. Histologic type(s): Adenocarcinoma. Histologic grade and differentiation: G1 - G2: well to moderately differentiated/low grade. Type of polyp in which invasive carcinoma arose: A precursor polyp is not identified. Microscopic extension of invasive tumor: Tumor invades through bowel wall to involve the serosa surface of the stomach. Lymph-Vascular invasion: Yes, present. Peri-neural invasion: Not identified. Tumor deposit(s) (discontinuous extramural extension): Yes, multiple tumor deposits in the omentum. Resection margins: Proximal colonic margin: Negative. Distal colonic margin: Negative. Stomach margin: Negative. Mesenteric margin (sigmoid and transverse): Grossly positive. Distance closest margin (if all above margins negative): Mesenteric margin is grossly positive. Treatment effect (neo-adjuvant therapy): Not applicable. Additional polyp(s): None identified. Additional findings: Acute serositis involves the proximal and distal colonic margins (likely due to tumor rupture). Lymph nodes: number examined 13; number  positive: 6. Pathologic Staging: pT4b, pN2a. Ancillary studies: The tumor will be sent for MSI testing by PCR and MMR by [facility] per [facility] Working Group protocol. (RH:ds 12/19/14)  ADDITIONAL INFORMATION: Mismatch Repair (MMR) Protein Immunohistochemistry (IHC) IHC Expression Result: MLH1: LOSS OF NUCLEAR EXPRESSION (LESS THAN 5% TUMOR EXPRESSION) MSH2: Preserved nuclear expression (greater 50% tumor expression) MSH6: Preserved nuclear expression (greater 50% tumor expression) PMS2: LOSS OF NUCLEAR EXPRESSION (LESS THAN 5% TUMOR EXPRESSION) * Internal control demonstrates intact nuclear expression  MSI-high  BRAF V600E1 (+)  RADIOGRAPHIC STUDIES: I have personally reviewed the radiological images as listed and agreed with the findings in the report. Ct Chest, Abdomen Pelvis W contrast 12/17/2014 IMPRESSION: 1. Large amount of free air and free fluid within the upper abdomen follow-up rarely about the liver, compatible with acute perforation. This likely reflects perforation at the level of the large colonic malignancy, though the site of perforation is not definitely characterized on this study. 2. Large left splenic flexure colonic mass again noted, with diffuse surrounding nodularity, reflecting local spread of disease. Extensive surrounding soft tissue inflammation is mildly more prominent than on the prior study. 3. Small left pleural effusion, new from the recent prior study, likely reactive secondary to bowel perforation. Bibasilar atelectasis noted. Lungs otherwise clear. 4. Scattered coronary artery calcifications seen. 5. Small pericardial effusion again noted. 6. 2.5 cm left adrenal lesion again raises concern for metastasis.  CT abdomen and pelvis with contrast on 01/24/2015 IMPRESSION: 1. Ill-defined soft tissue within the left upper abdominal quadrant worrisome for residual and/or locally recurrent disease with dominant residual soft tissue measuring  approximately 10.4 cm in diameter. 2. Findings compatible with progression of metastatic disease with worsening retroperitoneal, porta-hepatis and mesenteric adenopathy and interval increase in size of suspected left adrenal gland metastasis. 3. Interval development of an approximately 1.3 cm liver lesion compatible with hepatic metastatic disease. 4. Post hemicolectomy and end ileostomy creation without evidence of enteric obstruction. Note of definable/drainable intra-abdominal fluid collection. 5. Persistent open midline abdominal wound without associated hernia.   ASSESSMENT & PLAN:  74 year old female   1. Metastatic colon cancer, [id number], stage IV, with peritoneal, liver and left adrenal metastasis. MSI-H, BRAF  V600E1 (+)  -she has sporadic Lynch syndrome,  based on her MS I and BRAFmutation test results.  -I reviewed her scan and surgical path findings. Unfortunately she has metastatic disease, not curable, and overall prognosis is extremly poor, due to her severe complications after surgery, poor performance status and the rapid disease progression.  -We discussed the option of chemotherapy, given her poor performance status , she not a good candidate for chemotherapy.  -We discussed the alternative option of immunotherapy. Studies have shown and good response to PDL 1 inhibitor in patients with MSI high colon cancer. Pembrolizumab was approved by her insurance. She expressed her wish to try, we'll start her on next week. -Potential side effects from [facility], such as colitis, hypothyroidism, pneumonitis, and other endocrine disorders, we'll discussed with patient and her daughters. She voiced good understanding, consent was obtained. -Lab reviewed, we'll proceed with first cycle Pembro today   2. Worsening left abdominal pain, secondary to her metastatic disease -better with increased morphine ER to 30 milligrams every 12 hours -Continue morphine IR 23m every 4 hours  3. Right  foot ischemia, status post BKA -She is running out of Lovenox, I give her some sample from our pharmacy today. -I'll likely change her to Coumadin next week.  4. Anorexia, weight loss -Dietitian consult, social work referral -We'll give her free sample of Ensure today  5. Probable left pleural effusion -Continue monitoring.  6. UTI -She has persistent dysuria, no Bactrim now, better    Plan: -I'll see her back next week for symptom management.   Orders Placed This Encounter  Procedures  . IR Fluoro Guide CV Line Right    Within the next two weeks    Standing Status: Future     Number of Occurrences:      Standing Expiration Date: 04/07/2016    Order Specific Question:  Reason for Exam (SYMPTOM  OR DIAGNOSIS REQUIRED)    Answer:  need port for chemo, poor IV access    Order Specific Question:  [facility]?    [facility]   All questions were answered. The patient knows to call the clinic with any problems, questions or concerns. I spent 15 minutes counseling the patient face to face. The total time spent in the appointment was 20 minutes and more than 50% was on counseling.     FTruitt Merle MD 02/06/2015 6:02 PM

## 2015-02-06 NOTE — Progress Notes (Signed)
Patient complains of pain in her abdomen radiating to her back. Patient reports pain a 10 on the 0 to 10 pain scale. Dr. Burr Medico at chairside.

## 2015-02-06 NOTE — Progress Notes (Signed)
Patient will have a PAC placed.  Discussed with Dr. Burr Medico.  Patient will hold coumadin for 4 days and lovenox for 24hrs.  Tiffany with Radiology [phone] will discuss with patient.

## 2015-02-06 NOTE — Progress Notes (Signed)
Rx escribed to [facility].  Called patient and let her know that script is ready for her to get.

## 2015-02-06 NOTE — Patient Instructions (Signed)
[facility] Discharge Instructions for Patients Receiving Chemotherapy  Today you received the following chemotherapy agents Keytruda To help prevent nausea and vomiting after your treatment, we encourage you to take your nausea medication as prescribed.  If you develop nausea and vomiting that is not controlled by your nausea medication, call the clinic.   BELOW ARE SYMPTOMS THAT SHOULD BE REPORTED IMMEDIATELY:  *FEVER GREATER THAN 100.5 F  *CHILLS WITH OR WITHOUT FEVER  NAUSEA AND VOMITING THAT IS NOT CONTROLLED WITH YOUR NAUSEA MEDICATION  *UNUSUAL SHORTNESS OF BREATH  *UNUSUAL BRUISING OR BLEEDING  TENDERNESS IN MOUTH AND THROAT WITH OR WITHOUT PRESENCE OF ULCERS  *URINARY PROBLEMS  *BOWEL PROBLEMS  UNUSUAL RASH Items with * indicate a potential emergency and should be followed up as soon as possible.  Feel free to call the clinic you have any questions or concerns. The clinic phone number is [phone].  Please show the CHEMO ALERT CARD at check-in to the Emergency Department and triage nurse.  Pembrolizumab injection (Keytruda) What is this medicine? PEMBROLIZUMAB (pem broe liz ue mab) is used to treat certain types of melanoma, a skin cancer. It targets specific cancer cells and stops the cancer cells from growing. This medicine may be used for other purposes; ask your health care provider or pharmacist if you have questions. COMMON BRAND NAME(S): Keytruda What should I tell my health care provider before I take this medicine? They need to know if you have any of these conditions: -immune system problems -inflammatory bowel disease -liver disease -lung or breathing disease -lupus -an unusual or allergic reaction to pembrolizumab, other medicines, foods, dyes, or preservatives -pregnant or trying to get pregnant -breast-feeding How should I use this medicine? This medicine is for infusion into a vein. It is given by a health care professional in  a hospital or clinic setting. A special MedGuide will be given to you before each treatment. Be sure to read this information carefully each time. Talk to your pediatrician regarding the use of this medicine in children. Special care may be needed. Overdosage: If you think you've taken too much of this medicine contact a poison control center or emergency room at once. Overdosage: If you think you have taken too much of this medicine contact a poison control center or emergency room at once. NOTE: This medicine is only for you. Do not share this medicine with others. What if I miss a dose? It is important not to miss your dose. Call your doctor or health care professional if you are unable to keep an appointment. What may interact with this medicine? Interactions have not been studied. Give your health care provider a list of all the medicines, herbs, non-prescription drugs, or dietary supplements you use. Also tell them if you smoke, drink alcohol, or use illegal drugs. Some items may interact with your medicine. This list may not describe all possible interactions. Give your health care provider a list of all the medicines, herbs, non-prescription drugs, or dietary supplements you use. Also tell them if you smoke, drink alcohol, or use illegal drugs. Some items may interact with your medicine. What should I watch for while using this medicine? Your condition will be monitored carefully while you are receiving this medicine. You may need blood work done while you are taking this medicine. Do not become pregnant while taking this medicine or for 4 months after stopping it. Women should inform their doctor if they wish to become pregnant or think they might   be pregnant. There is a potential for serious side effects to an unborn child. Talk to your health care professional or pharmacist for more information. Do not breast-feed an infant while taking this medicine. What side effects may I notice from  receiving this medicine? Side effects that you should report to your doctor or health care professional as soon as possible: -allergic reactions like skin rash, itching or hives, swelling of the face, lips, or tongue -bloody or black, tarry stools -change in the amount of urine -changes in vision -chest pain -dark urine -dizziness or feeling faint or lightheaded -fast or irregular heartbeat -hair loss -muscle pain -muscle weakness -persistent headache -shortness of breath -signs and symptoms of liver injury like dark urine, light-colored stools, loss of appetite, nausea, right upper belly pain, yellowing of the eyes or skin -stomach pain -weight loss Side effects that usually do not require medical attention (Report these to your doctor or health care professional if they continue or are bothersome.):constipation -cough -diarrhea -joint pain -tiredness This list may not describe all possible side effects. Call your doctor for medical advice about side effects. You may report side effects to FDA at 1-800-FDA-1088. Where should I keep my medicine? This drug is given in a hospital or clinic and will not be stored at home. NOTE: This sheet is a summary. It may not cover all possible information. If you have questions about this medicine, talk to your doctor, pharmacist, or health care provider.  2015, Elsevier/Gold Standard. (2013-04-28 12:52:03)  

## 2015-02-06 NOTE — Telephone Encounter (Signed)
S.w. IR they have contacted pt and MD

## 2015-02-08 ENCOUNTER — Telehealth: Payer: Self-pay

## 2015-02-08 NOTE — Telephone Encounter (Signed)
-----   Message from Perlie Gold sent at 02/06/2015  3:41 PM EDT ----- Regarding: Chemo Follow up Call- Dr. Richardson Dopp: [phone] First time Keytruda. Dr. Burr Medico patient. Please call.  Thanks,  Barnetta Chapel, RN

## 2015-02-08 NOTE — Telephone Encounter (Signed)
Talked with daughter. Pt is napping. Pt is able to eat and drink, mild nausea, colostomy is working. No fevers. Daughter asked about Port placement, she has not received a call. LVM with IR to verify they did receive order dated 6/21.

## 2015-02-09 ENCOUNTER — Ambulatory Visit: Payer: Medicare Other | Admitting: Hematology

## 2015-02-09 ENCOUNTER — Encounter: Payer: Medicare Other | Admitting: Vascular Surgery

## 2015-02-09 ENCOUNTER — Other Ambulatory Visit: Payer: Medicare Other

## 2015-02-09 ENCOUNTER — Encounter: Payer: Self-pay | Admitting: Vascular Surgery

## 2015-02-09 ENCOUNTER — Ambulatory Visit: Payer: Medicare Other

## 2015-02-12 ENCOUNTER — Other Ambulatory Visit: Payer: Self-pay | Admitting: Physician Assistant

## 2015-02-12 ENCOUNTER — Other Ambulatory Visit: Payer: Medicare Other

## 2015-02-13 ENCOUNTER — Encounter: Payer: Medicare Other | Admitting: Vascular Surgery

## 2015-02-13 ENCOUNTER — Ambulatory Visit (HOSPITAL_BASED_OUTPATIENT_CLINIC_OR_DEPARTMENT_OTHER): Payer: Self-pay | Admitting: Hematology

## 2015-02-13 ENCOUNTER — Other Ambulatory Visit (HOSPITAL_BASED_OUTPATIENT_CLINIC_OR_DEPARTMENT_OTHER): Payer: Medicare Other | Admitting: *Deleted

## 2015-02-13 ENCOUNTER — Encounter: Payer: Self-pay | Admitting: Hematology

## 2015-02-13 ENCOUNTER — Encounter: Payer: Self-pay | Admitting: *Deleted

## 2015-02-13 ENCOUNTER — Inpatient Hospital Stay (HOSPITAL_COMMUNITY): Payer: Medicare Other

## 2015-02-13 ENCOUNTER — Other Ambulatory Visit: Payer: Self-pay | Admitting: Radiology

## 2015-02-13 ENCOUNTER — Encounter (HOSPITAL_COMMUNITY): Payer: Self-pay | Admitting: *Deleted

## 2015-02-13 ENCOUNTER — Other Ambulatory Visit (HOSPITAL_BASED_OUTPATIENT_CLINIC_OR_DEPARTMENT_OTHER): Payer: Self-pay

## 2015-02-13 ENCOUNTER — Inpatient Hospital Stay (HOSPITAL_COMMUNITY)
Admission: AD | Admit: 2015-02-13 | Discharge: 2015-03-19 | DRG: 682 | Disposition: E | Payer: Medicare Other | Source: Ambulatory Visit | Attending: Internal Medicine | Admitting: Internal Medicine

## 2015-02-13 ENCOUNTER — Ambulatory Visit: Payer: Self-pay | Admitting: Pharmacist

## 2015-02-13 ENCOUNTER — Other Ambulatory Visit (HOSPITAL_COMMUNITY)
Admission: RE | Admit: 2015-02-13 | Discharge: 2015-02-13 | Disposition: A | Discharge status: Home / Self Care | Payer: Medicare Other | Source: Ambulatory Visit | Attending: Hematology | Admitting: Hematology

## 2015-02-13 ENCOUNTER — Telehealth: Payer: Self-pay | Admitting: *Deleted

## 2015-02-13 VITALS — BP 94/57 | HR 94 | Temp 98.1°F | Resp 18 | Ht 71.0 in

## 2015-02-13 DIAGNOSIS — Z66 Do not resuscitate: Secondary | ICD-10-CM | POA: Diagnosis not present

## 2015-02-13 DIAGNOSIS — J948 Other specified pleural conditions: Secondary | ICD-10-CM | POA: Diagnosis not present

## 2015-02-13 DIAGNOSIS — N170 Acute kidney failure with tubular necrosis: Secondary | ICD-10-CM | POA: Diagnosis present

## 2015-02-13 DIAGNOSIS — R0600 Dyspnea, unspecified: Secondary | ICD-10-CM | POA: Insufficient documentation

## 2015-02-13 DIAGNOSIS — R0989 Other specified symptoms and signs involving the circulatory and respiratory systems: Secondary | ICD-10-CM | POA: Diagnosis present

## 2015-02-13 DIAGNOSIS — R109 Unspecified abdominal pain: Secondary | ICD-10-CM | POA: Diagnosis present

## 2015-02-13 DIAGNOSIS — K219 Gastro-esophageal reflux disease without esophagitis: Secondary | ICD-10-CM | POA: Diagnosis present

## 2015-02-13 DIAGNOSIS — R64 Cachexia: Secondary | ICD-10-CM | POA: Diagnosis present

## 2015-02-13 DIAGNOSIS — C189 Malignant neoplasm of colon, unspecified: Secondary | ICD-10-CM | POA: Diagnosis present

## 2015-02-13 DIAGNOSIS — J029 Acute pharyngitis, unspecified: Secondary | ICD-10-CM | POA: Diagnosis present

## 2015-02-13 DIAGNOSIS — G893 Neoplasm related pain (acute) (chronic): Secondary | ICD-10-CM

## 2015-02-13 DIAGNOSIS — K5900 Constipation, unspecified: Secondary | ICD-10-CM

## 2015-02-13 DIAGNOSIS — N179 Acute kidney failure, unspecified: Secondary | ICD-10-CM | POA: Diagnosis not present

## 2015-02-13 DIAGNOSIS — E119 Type 2 diabetes mellitus without complications: Secondary | ICD-10-CM | POA: Diagnosis not present

## 2015-02-13 DIAGNOSIS — R791 Abnormal coagulation profile: Secondary | ICD-10-CM | POA: Diagnosis present

## 2015-02-13 DIAGNOSIS — Z681 Body mass index (BMI) 19 or less, adult: Secondary | ICD-10-CM

## 2015-02-13 DIAGNOSIS — Z7901 Long term (current) use of anticoagulants: Secondary | ICD-10-CM | POA: Diagnosis not present

## 2015-02-13 DIAGNOSIS — N19 Unspecified kidney failure: Secondary | ICD-10-CM

## 2015-02-13 DIAGNOSIS — T45515A Adverse effect of anticoagulants, initial encounter: Secondary | ICD-10-CM | POA: Diagnosis present

## 2015-02-13 DIAGNOSIS — I8290 Acute embolism and thrombosis of unspecified vein: Secondary | ICD-10-CM | POA: Insufficient documentation

## 2015-02-13 DIAGNOSIS — D473 Essential (hemorrhagic) thrombocythemia: Secondary | ICD-10-CM | POA: Diagnosis present

## 2015-02-13 DIAGNOSIS — F32A Depression, unspecified: Secondary | ICD-10-CM | POA: Diagnosis present

## 2015-02-13 DIAGNOSIS — C185 Malignant neoplasm of splenic flexure: Secondary | ICD-10-CM

## 2015-02-13 DIAGNOSIS — E43 Unspecified severe protein-calorie malnutrition: Secondary | ICD-10-CM | POA: Diagnosis present

## 2015-02-13 DIAGNOSIS — J811 Chronic pulmonary edema: Secondary | ICD-10-CM | POA: Diagnosis not present

## 2015-02-13 DIAGNOSIS — I998 Other disorder of circulatory system: Secondary | ICD-10-CM

## 2015-02-13 DIAGNOSIS — Z87891 Personal history of nicotine dependence: Secondary | ICD-10-CM

## 2015-02-13 DIAGNOSIS — I739 Peripheral vascular disease, unspecified: Secondary | ICD-10-CM | POA: Diagnosis present

## 2015-02-13 DIAGNOSIS — Z79891 Long term (current) use of opiate analgesic: Secondary | ICD-10-CM | POA: Diagnosis not present

## 2015-02-13 DIAGNOSIS — F411 Generalized anxiety disorder: Secondary | ICD-10-CM | POA: Diagnosis present

## 2015-02-13 DIAGNOSIS — Z79899 Other long term (current) drug therapy: Secondary | ICD-10-CM

## 2015-02-13 DIAGNOSIS — F329 Major depressive disorder, single episode, unspecified: Secondary | ICD-10-CM | POA: Diagnosis present

## 2015-02-13 DIAGNOSIS — Z9049 Acquired absence of other specified parts of digestive tract: Secondary | ICD-10-CM | POA: Diagnosis present

## 2015-02-13 DIAGNOSIS — Z89511 Acquired absence of right leg below knee: Secondary | ICD-10-CM

## 2015-02-13 DIAGNOSIS — Z90710 Acquired absence of both cervix and uterus: Secondary | ICD-10-CM | POA: Diagnosis not present

## 2015-02-13 DIAGNOSIS — C786 Secondary malignant neoplasm of retroperitoneum and peritoneum: Secondary | ICD-10-CM

## 2015-02-13 DIAGNOSIS — J90 Pleural effusion, not elsewhere classified: Secondary | ICD-10-CM | POA: Diagnosis present

## 2015-02-13 DIAGNOSIS — I10 Essential (primary) hypertension: Secondary | ICD-10-CM | POA: Diagnosis present

## 2015-02-13 DIAGNOSIS — K5660 Unspecified intestinal obstruction: Secondary | ICD-10-CM | POA: Diagnosis present

## 2015-02-13 DIAGNOSIS — Z515 Encounter for palliative care: Secondary | ICD-10-CM

## 2015-02-13 DIAGNOSIS — R627 Adult failure to thrive: Secondary | ICD-10-CM | POA: Diagnosis present

## 2015-02-13 DIAGNOSIS — K56609 Unspecified intestinal obstruction, unspecified as to partial versus complete obstruction: Secondary | ICD-10-CM | POA: Diagnosis present

## 2015-02-13 DIAGNOSIS — R634 Abnormal weight loss: Secondary | ICD-10-CM

## 2015-02-13 DIAGNOSIS — C7972 Secondary malignant neoplasm of left adrenal gland: Secondary | ICD-10-CM

## 2015-02-13 DIAGNOSIS — Z8249 Family history of ischemic heart disease and other diseases of the circulatory system: Secondary | ICD-10-CM | POA: Diagnosis not present

## 2015-02-13 DIAGNOSIS — E860 Dehydration: Secondary | ICD-10-CM | POA: Diagnosis present

## 2015-02-13 DIAGNOSIS — C799 Secondary malignant neoplasm of unspecified site: Secondary | ICD-10-CM | POA: Diagnosis present

## 2015-02-13 DIAGNOSIS — B370 Candidal stomatitis: Secondary | ICD-10-CM | POA: Diagnosis present

## 2015-02-13 DIAGNOSIS — R1084 Generalized abdominal pain: Secondary | ICD-10-CM | POA: Diagnosis not present

## 2015-02-13 DIAGNOSIS — R630 Anorexia: Secondary | ICD-10-CM

## 2015-02-13 LAB — CBC WITH DIFFERENTIAL/PLATELET
BASO%: 1.2 % (ref 0.0–2.0)
Basophils Absolute: 0.1 10*3/uL (ref 0.0–0.1)
EOS%: 0.6 % (ref 0.0–7.0)
Eosinophils Absolute: 0.1 10*3/uL (ref 0.0–0.5)
HCT: 39.6 % (ref 34.8–46.6)
HEMOGLOBIN: 12.7 g/dL (ref 11.6–15.9)
LYMPH#: 1.5 10*3/uL (ref 0.9–3.3)
LYMPH%: 16.5 % (ref 14.0–49.7)
MCH: 27.2 pg (ref 25.1–34.0)
MCHC: 32 g/dL (ref 31.5–36.0)
MCV: 85 fL (ref 79.5–101.0)
MONO#: 0.5 10*3/uL (ref 0.1–0.9)
MONO%: 5.4 % (ref 0.0–14.0)
NEUT%: 76.3 % (ref 38.4–76.8)
NEUTROS ABS: 6.9 10*3/uL — AB (ref 1.5–6.5)
Platelets: 592 10*3/uL — ABNORMAL HIGH (ref 145–400)
RBC: 4.66 10*6/uL (ref 3.70–5.45)
RDW: 20.8 % — AB (ref 11.2–14.5)
WBC: 9.1 10*3/uL (ref 3.9–10.3)

## 2015-02-13 LAB — PROTIME-INR
INR: 6.74 (ref 0.00–1.49)
PROTHROMBIN TIME: 56.2 s — AB (ref 11.6–15.2)

## 2015-02-13 LAB — COMPREHENSIVE METABOLIC PANEL (CC13)
ALT: 10 U/L (ref 0–55)
ANION GAP: 15 meq/L — AB (ref 3–11)
AST: 25 U/L (ref 5–34)
Albumin: 3.1 g/dL — ABNORMAL LOW (ref 3.5–5.0)
Alkaline Phosphatase: 140 U/L (ref 40–150)
BUN: 60 mg/dL — AB (ref 7.0–26.0)
CO2: 20 mEq/L — ABNORMAL LOW (ref 22–29)
Calcium: 10 mg/dL (ref 8.4–10.4)
Chloride: 99 mEq/L (ref 98–109)
Creatinine: 3.5 mg/dL (ref 0.6–1.1)
EGFR: 12 mL/min/{1.73_m2} — AB (ref >90)
Glucose: 126 mg/dl (ref 70–140)
Potassium: 4.6 mEq/L (ref 3.5–5.1)
SODIUM: 134 meq/L — AB (ref 136–145)
Total Bilirubin: 0.34 mg/dL (ref 0.20–1.20)
Total Protein: 8.3 g/dL (ref 6.4–8.3)

## 2015-02-13 MED ORDER — ACETAMINOPHEN 650 MG RE SUPP: 650.0000 mg | Freq: Four times a day (QID) | RECTAL | Status: DC | PRN

## 2015-02-13 MED ORDER — MORPHINE SULFATE ER 30 MG PO TBCR
30.0000 mg | EXTENDED_RELEASE_TABLET | Freq: Three times a day (TID) | ORAL | Status: DC
  Administered 2015-02-13 – 2015-02-14 (×3): 30 mg via ORAL
  Filled 2015-02-13 (×3): qty 1

## 2015-02-13 MED ORDER — PROCHLORPERAZINE MALEATE 10 MG PO TABS: 10.0000 mg | ORAL_TABLET | Freq: Three times a day (TID) | ORAL | Status: DC | PRN

## 2015-02-13 MED ORDER — WARFARIN - PHARMACIST DOSING INPATIENT: Freq: Every day | Status: DC

## 2015-02-13 MED ORDER — GABAPENTIN 100 MG PO CAPS
100.0000 mg | ORAL_CAPSULE | Freq: Three times a day (TID) | ORAL | Status: DC
  Administered 2015-02-13 – 2015-02-16 (×6): 100 mg via ORAL
  Filled 2015-02-13 (×7): qty 1

## 2015-02-13 MED ORDER — DOCUSATE SODIUM 100 MG PO CAPS
100.0000 mg | ORAL_CAPSULE | Freq: Every day | ORAL | Status: DC
  Administered 2015-02-13 – 2015-02-16 (×4): 100 mg via ORAL
  Filled 2015-02-13 (×4): qty 1

## 2015-02-13 MED ORDER — MORPHINE SULFATE 2 MG/ML IJ SOLN
2.0000 mg | INTRAMUSCULAR | Status: DC | PRN
  Administered 2015-02-13: 2 mg via INTRAVENOUS
  Filled 2015-02-13 (×2): qty 1

## 2015-02-13 MED ORDER — SODIUM CHLORIDE 0.9 % IV SOLN
INTRAVENOUS | Status: DC
  Administered 2015-02-13 – 2015-02-16 (×6): via INTRAVENOUS

## 2015-02-13 MED ORDER — MORPHINE SULFATE 15 MG PO TABS
15.0000 mg | ORAL_TABLET | ORAL | Status: DC | PRN
  Administered 2015-02-13: 15 mg via ORAL
  Filled 2015-02-13 (×2): qty 1

## 2015-02-13 MED ORDER — ONDANSETRON HCL 4 MG PO TABS
4.0000 mg | ORAL_TABLET | Freq: Four times a day (QID) | ORAL | Status: DC | PRN
  Administered 2015-02-15: 4 mg via ORAL
  Filled 2015-02-13: qty 1

## 2015-02-13 MED ORDER — BISACODYL 5 MG PO TBEC: 5.0000 mg | DELAYED_RELEASE_TABLET | Freq: Every day | ORAL | Status: DC | PRN

## 2015-02-13 MED ORDER — LORAZEPAM 0.5 MG PO TABS
0.5000 mg | ORAL_TABLET | Freq: Three times a day (TID) | ORAL | Status: DC
  Administered 2015-02-13: 1 mg via ORAL
  Administered 2015-02-13: 0.5 mg via ORAL
  Administered 2015-02-14 – 2015-02-15 (×2): 1 mg via ORAL
  Filled 2015-02-13 (×2): qty 2
  Filled 2015-02-13: qty 1
  Filled 2015-02-13 (×2): qty 2

## 2015-02-13 MED ORDER — ONDANSETRON HCL 4 MG/2ML IJ SOLN
4.0000 mg | Freq: Four times a day (QID) | INTRAMUSCULAR | Status: DC | PRN
  Administered 2015-02-15: 4 mg via INTRAVENOUS
  Filled 2015-02-13: qty 2

## 2015-02-13 MED ORDER — MORPHINE SULFATE 4 MG/ML IJ SOLN
INTRAMUSCULAR | Status: AC
  Filled 2015-02-13: qty 1

## 2015-02-13 MED ORDER — PANTOPRAZOLE SODIUM 40 MG PO TBEC
40.0000 mg | DELAYED_RELEASE_TABLET | Freq: Every day | ORAL | Status: DC
  Administered 2015-02-13 – 2015-02-14 (×2): 40 mg via ORAL
  Filled 2015-02-13 (×2): qty 1

## 2015-02-13 MED ORDER — METHOCARBAMOL 500 MG PO TABS
500.0000 mg | ORAL_TABLET | Freq: Four times a day (QID) | ORAL | Status: DC | PRN
  Administered 2015-02-13: 500 mg via ORAL
  Filled 2015-02-13: qty 1

## 2015-02-13 MED ORDER — SODIUM CHLORIDE 0.9 % IJ SOLN: 3.0000 mL | Freq: Two times a day (BID) | INTRAMUSCULAR | Status: DC

## 2015-02-13 MED ORDER — ACETAMINOPHEN 325 MG PO TABS: 650.0000 mg | ORAL_TABLET | Freq: Four times a day (QID) | ORAL | Status: DC | PRN

## 2015-02-13 MED ORDER — FLUOXETINE HCL 20 MG PO CAPS
20.0000 mg | ORAL_CAPSULE | Freq: Every day | ORAL | Status: DC
  Administered 2015-02-13 – 2015-02-14 (×2): 20 mg via ORAL
  Filled 2015-02-13 (×2): qty 1

## 2015-02-13 MED ORDER — MORPHINE SULFATE 4 MG/ML IJ SOLN
4.0000 mg | Freq: Once | INTRAMUSCULAR | Status: AC
Start: 2015-02-13 — End: 2015-02-13
  Administered 2015-02-13: 4 mg via INTRAVENOUS

## 2015-02-13 MED ORDER — NYSTATIN 100000 UNIT/ML MT SUSP
5.0000 mL | Freq: Four times a day (QID) | OROMUCOSAL | Status: DC
  Administered 2015-02-13 – 2015-02-16 (×8): 500000 [IU] via OROMUCOSAL
  Filled 2015-02-13 (×10): qty 5

## 2015-02-13 NOTE — Progress Notes (Signed)
ANTICOAGULATION CONSULT NOTE - Initial Consult  Pharmacy Consult for Warfarin Indication: history of VTE  No Known Allergies  Patient Measurements: Height: 5\' 11"  (180.3 cm) Weight: 110 lb 7.2 oz (50.1 kg) IBW/kg (Calculated) : 70.8   Vital Signs: Temp: 98 F (36.7 C) (06/28 1545) Temp Source: Oral (06/28 1545) BP: 129/66 mmHg (06/28 1545) Pulse Rate: 79 (06/28 1545)  Labs:  Recent Labs  01/25/2015 1243 02/14/2015 1306  HGB 12.7  --   HCT 39.6  --   PLT 592*  --   LABPROT  --  56.2*  INR Sent out for confirmation 6.74*  CREATININE 3.5*  --     Estimated Creatinine Clearance: 11.3 mL/min (by C-G formula based on Cr of 3.5).   Medical History: Past Medical History  Diagnosis Date  . Diabetes mellitus without complication   . Hypertension   . GERD (gastroesophageal reflux disease)   . Depression   . 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  Patient was recently discharged on 6/11 following an admission for UTI with sepsis.  Pharmacy consulted to manage warfarin inpatient.  INR high (6.74) on admission.  Decreased PO intake PTA likely contributed.  No signs of bleeding per MD note. Hgb WNL, platelets high.  Goal of Therapy:  INR 2-3   Plan:  Hold warfarin. Daily INR.  Hershal Coria 02/06/2015,4:54 PM

## 2015-02-13 NOTE — Progress Notes (Signed)
74 year old lady with h/o metastatic colon cancer presented to Dr Ernestina Penna office for abdominal pain.she was found to be dehydrated and in acute renal failure.  Requesting to be admitted to hospitalist service for acute renal failure and dehydration and evaluation of abdominal pain.    Hosie Poisson, MD [phone]

## 2015-02-13 NOTE — Telephone Encounter (Signed)
REVIEWED PT.'S APPOINTMENTS FOR THIS WEEK WITH PT.'S DAUGHTER.

## 2015-02-13 NOTE — Progress Notes (Signed)
Pt was only seen by provider today  INR=6.74 and Scr is 3.5  Md to admit patient for ARF She presumes they will reverse INR as pt is to get port placed tomorrow

## 2015-02-13 NOTE — Progress Notes (Signed)
[facility]  [phone] [phone]) Agency Note   Patient Care Team: No Pcp Per Patient as PCP - General (General Practice) 02/12/2015  CHIEF COMPLAINTs:  Follow up metastatic colon cancer  Oncology History   Colon cancer metastasized to multiple sites   Staging form: Neuroendocrine Tumor - Colon/Rectum, AJCC 7th Edition     Clinical: Stage IV (T4, N1, M1) - Signed by Truitt Merle, MD on 01/24/2015       Colon cancer metastasized to multiple sites   12/17/2014 Initial Diagnosis Colon cancer metastasized to multiple sites   12/17/2014 Imaging CT chest, abdomen and pelvis showed large left splenic flexure chronic mass, with diffuse surrounding nodularity, reflecting local spread of disease. Small left pleural effusion, 2.5 cm left adrenal gland mass concerning for metastasis.   12/17/2014 Surgery Segmental resection of right, transverse, partial left colon, and a portion of stomach, mesenteric margins were grossly positive.   12/17/2014 Pathology Results Perforated well to moderately differentiated adenocarcinoma, spanning 13 cm in greatest dimension, 6 out of 13 lymph nodes were positive, mesenteric margins were grossly positive.   12/17/2014 Miscellaneous Tumor cells IHC showed loss of ML H1 and p.m. S2. MSI-high by PCR, BRAF V600E1 mutation positive, supports sporadic Lynch syndrome.   02/06/2015 -  Chemotherapy pembrolizumab 2mg /kg, every 2 weeks    HISTORY OF PRESENTING ILLNESS:  Kathryn Knapp 74 y.o. female is here because of recently diagnosed metastatic colon cancer. I saw her initially in the hospital about a months ago, she is here for follow-up after hospital discharge.  She was previously very active and independent until 12/16/2014 when was admitted to [facility] for abdominal pain nausea and vomiting. CT scan showed a perforated colon mass at splenic flexure with peritoneal carcinomatosis and a left adrenal mass. She  Underwent  emergent surgery with exploratory laparotomy, right transverse and partial left colectomy with gastrectomy and ileostomy per Dr. Georganna Skeans.Her hospital course was complicated by respiratory failure, septic shock, acute renal failure, and the right LE ischemia requided below-knee amputation. She recovered from the acute conversations, and was discharged to a nursing home after prolonged hospital stay.  During her stay at the rehabilitation, she was able to do some rehabilitation, mainly in the wheelchair, able to transfer from chair to bathroom. She was finally released from rehabilitation and went home a week ago. She has been having left abdominal pain and left shoulder pain since hospital discharge, was intermittent, and positional, but it became constant for the past 3 days, stool output is about the same, (+) nausea, no vomiting, appettite is low since the surgery, eats very little, no fever or chills. She actually called to request urgent appointment today due to her worsening abdominal pain.   INTERIM HISTORY Kathryn Knapp returns for follow-up after first cycle pembrolizumab. She tolerated the infusion well, without any reaction or side effects afterwards. She complains about sore throat and hoarseness in the past to 3 days, especially with swallowing and eating. Her appetite is low, she has not been eating much food, she drinks water about 3 cups a day. No fever or chills. Her dysuria has resolved after course of antibiotics. She is taking MS Contin 30 mg twice a day and morphine as needed, but feels her pain is still not adequately controlled.   MEDICAL HISTORY:  Past Medical History  Diagnosis Date  . Diabetes mellitus without complication   . Hypertension   . GERD (gastroesophageal reflux disease)   .  Depression   . Colon cancer metastasized to multiple sites     SURGICAL HISTORY: Past Surgical History  Procedure Laterality Date  . Cholecystectomy    . Abdominal hysterectomy    .  Laparotomy N/A 12/17/2014    Procedure: EXPLORATORY LAPAROTOMY Right, Transverse, partial left colectomy with en bloc partial gastrectomy illeostomy;  Surgeon: Georganna Skeans, MD;  Location: [facility];  Service: General;  Laterality: N/A;  . Amputation Right 12/28/2014    Procedure: AMPUTATION BELOW KNEE;  Surgeon: Conrad Old Green, MD;  Location: [facility];  Service: Vascular;  Laterality: Right;    SOCIAL HISTORY: History   Social History  . Marital Status: Divorced    Spouse Name: N/A  . Number of Children: N/A  . Years of Education: N/A   Occupational History  . Not on file.   Social History Main Topics  . Smoking status: Former Smoker -- 2.00 packs/day for 40 years    Types: Cigarettes    Quit date: 11/17/1990  . Smokeless tobacco: Not on file  . Alcohol Use: No  . Drug Use: No  . Sexual Activity: Not on file   Other Topics Concern  . Not on file   Social History Narrative    FAMILY HISTORY: Family History  Problem Relation Age of Onset  . Pneumonia Brother   . Heart attack Sister     ALLERGIES:  has No Known Allergies.  MEDICATIONS:  Current Outpatient Prescriptions  Medication Sig Dispense Refill  . acetaminophen (TYLENOL) 325 MG tablet Take 1-2 tablets (325-650 mg total) by mouth every 4 (four) hours as needed for mild pain (or temp >/= 101 F).    . docusate sodium (COLACE) 100 MG capsule Take 1 capsule (100 mg total) by mouth daily. 10 capsule 0  . feeding supplement, ENSURE ENLIVE, (ENSURE ENLIVE) LIQD Take 237 mLs by mouth 2 (two) times daily between meals. 237 mL 12  . FLUoxetine (PROZAC) 20 MG capsule Take 1 capsule (20 mg total) by mouth daily. 30 capsule 3  . gabapentin (NEURONTIN) 100 MG capsule Take 1 capsule (100 mg total) by mouth 3 (three) times daily. 90 capsule 1  . LORazepam (ATIVAN) 0.5 MG tablet Take 1-2 tablets (0.5-1 mg total) by mouth every 8 (eight) hours. 30 tablet 0  . methocarbamol (ROBAXIN) 500 MG tablet Take 1 tablet (500 mg total) by mouth every  6 (six) hours as needed for muscle spasms. 90 tablet 0  . morphine (MS CONTIN) 15 MG 12 hr tablet Take 1 tablet (15 mg total) by mouth every 12 (twelve) hours. 60 tablet 0  . morphine (MSIR) 15 MG tablet Take 1 tablet (15 mg total) by mouth every 4 (four) hours as needed for severe pain. 120 tablet 0  . pantoprazole (PROTONIX) 40 MG tablet Take 1 tablet (40 mg total) by mouth daily. 30 tablet 1  . prochlorperazine (COMPAZINE) 10 MG tablet Take 1 tablet (10 mg total) by mouth every 8 (eight) hours as needed for nausea or vomiting. 20 tablet 0  . sulfamethoxazole-trimethoprim (BACTRIM DS,SEPTRA DS) 800-160 MG per tablet Take 1 tablet by mouth 2 (two) times daily. 10 tablet 0  . warfarin (COUMADIN) 5 MG tablet Take 1 tablet (5 mg total) by mouth daily. 30 tablet 0   No current facility-administered medications for this visit.    REVIEW OF SYSTEMS:   Constitutional: Denies fevers, chills or abnormal night sweats, (+) fatigue Eyes: Denies blurriness of vision, double vision or watery eyes Ears, nose, mouth, throat,  and face: Denies mucositis or sore throat Respiratory: Denies cough, dyspnea or wheezes Cardiovascular: Denies palpitation, chest discomfort or lower extremity swelling Gastrointestinal:  See HPI  Skin: Denies abnormal skin rashes Lymphatics: Denies new lymphadenopathy or easy bruising Neurological:Denies numbness, tingling or new weaknesses Behavioral/Psych: Mood is stable, no new changes  All other systems were reviewed with the patient and are negative.  PHYSICAL EXAMINATION: ECOG PERFORMANCE STATUS: 3  blood pressure 134/66, heart rate 105, respiratory rate 18, temperature 37, pulse ox 100%  GENERAL:alert, no distress and comfortable, cachatic and chronically ill-appearing SKIN: skin color, texture, turgor are normal, no rashes or significant lesions EYES: normal, conjunctiva are pink and non-injected, sclera clear OROPHARYNX: (+) Or thrush on the left tonsil area, with a mild  mucous erythema, no exudate, buccal mucosa, and tongue normal  NECK: supple, thyroid normal size, non-tender, without nodularity LYMPH:  no palpable lymphadenopathy in the cervical, axillary or inguinal LUNGS: clear to auscultation and percussion with normal breathing effort on right, significant decrease of breath sound on the left upper to mid chest.  HEART: regular rate & rhythm and no murmurs and no lower extremity edema ABDOMEN:abdomen soft, diffuse tenderness at the left upper quadrant of abdomen normal bowel sounds Musculoskeletal:no cyanosis of digits and no clubbing, rtight LE BKA PSYCH: alert & oriented x 3 with fluent speech NEURO: no focal motor/sensory deficits  LABORATORY DATA:  I have reviewed the data as listed Lab Results  Component Value Date   WBC 9.1 01/23/2015   HGB 12.7 01/21/2015   HCT 39.6 02/14/2015   MCV 85.0 01/26/2015   PLT 592* 02/15/2015    Recent Labs  01/05/15 1152  01/15/15 0621 01/24/15 1814 01/25/15 0148 02/02/15 1510 02/06/15 1340 01/26/2015 1243  NA 139  --   --  138 137 143 138 134*  K 4.5  --   --  4.0 3.6 3.9 4.4 4.6  CL 101  --   --  101 104  --   --   --   CO2 31  --   --  $R'26 25 29 26 'Qs$ 20*  GLUCOSE 121*  --   --  110* 95 127 150* 126  BUN 7  --   --  19 15 11.5 22.4 60.0*  CREATININE 0.87  < > 0.67 0.73 0.56 0.8 1.5* 3.5*  CALCIUM 7.7*  --   --  9.3 8.5* 9.4 9.5 10.0  GFRNONAA >60  < > >60 >60 >60  --   --   --   GFRAA >60  < > >60 >60 >60  --   --   --   PROT  --   --   --  7.6 6.3* 6.9 7.5 8.3  ALBUMIN  --   --   --  2.8* 2.4* 2.5* 2.8* 3.1*  AST  --   --   --  $R'24 19 16 19 25  'ry$ ALT  --   --   --  16 12* $Remo'10 11 10  'iytRG$ ALKPHOS  --   --   --  175* 146* 150 146 140  BILITOT  --   --   --  0.5 0.5 0.35 0.23 0.34  < > = values in this interval not displayed.  Pathology reports: Diagnosis 12/17/2014 Colon, segmental resection for tumor, right, transverse, partial left, and portion of stomach - PERFORATED WELL TO MODERATELY DIFFERENTIATED  ADENOCARCINOMA, SPANNING 13 CM IN GREATEST DIMENSION. - ADENOCARCINOMA INVADES THROUGH BOWEL WALL TO INVOLVE THE SEROSA OF THE  STOMACH. - MESENTERIC MARGIN IS GROSSLY INVOLVED WITH TUMOR. - PROXIMAL AND DISTAL COLONIC MARGINS DEMONSTRATE ACUTE SEROSITIS, BUT ARE NEGATIVE FOR TUMOR. - STOMACH MARGIN IS NEGATIVE FOR TUMOR. - LYMPH/VASCULAR INVASION IS PRESENT WITH 6 OF 13 LYMPH NODES POSITIVE FOR METASTATIC ADENOCARCINOMA (6/13). - EXTRACAPSULAR EXTENSION IS IDENTIFIED. - MULTIPLE (GREATER THAN 5) Microscopic Comment COLON AND RECTUM: Specimen: Right, transverse, and left colon with portion of terminal ileum, and attached portion of stomach. Procedure: Right, transverse, and partial left colectomy with en-bloc partial gastrectomy. Tumor site: Distal transverse/proximal left colon. Specimen integrity: The specimen is disrupted / perforated in the area of tumor, 7 cm from the distal margin. Macroscopic tumor perforation: Present. Invasive tumor: Maximum size: 13 cm. Histologic type(s): Adenocarcinoma. Histologic grade and differentiation: G1 - G2: well to moderately differentiated/low grade. Type of polyp in which invasive carcinoma arose: A precursor polyp is not identified. Microscopic extension of invasive tumor: Tumor invades through bowel wall to involve the serosa surface of the stomach. Lymph-Vascular invasion: Yes, present. Peri-neural invasion: Not identified. Tumor deposit(s) (discontinuous extramural extension): Yes, multiple tumor deposits in the omentum. Resection margins: Proximal colonic margin: Negative. Distal colonic margin: Negative. Stomach margin: Negative. Mesenteric margin (sigmoid and transverse): Grossly positive. Distance closest margin (if all above margins negative): Mesenteric margin is grossly positive. Treatment effect (neo-adjuvant therapy): Not applicable. Additional polyp(s): None identified. Additional findings: Acute serositis involves the proximal  and distal colonic margins (likely due to tumor rupture). Lymph nodes: number examined 13; number positive: 6. Pathologic Staging: pT4b, pN2a. Ancillary studies: The tumor will be sent for MSI testing by PCR and MMR by [facility] per [facility] Working Group protocol. (RH:ds 12/19/14)  ADDITIONAL INFORMATION: Mismatch Repair (MMR) Protein Immunohistochemistry (IHC) IHC Expression Result: MLH1: LOSS OF NUCLEAR EXPRESSION (LESS THAN 5% TUMOR EXPRESSION) MSH2: Preserved nuclear expression (greater 50% tumor expression) MSH6: Preserved nuclear expression (greater 50% tumor expression) PMS2: LOSS OF NUCLEAR EXPRESSION (LESS THAN 5% TUMOR EXPRESSION) * Internal control demonstrates intact nuclear expression  MSI-high  BRAF V600E1 (+)  RADIOGRAPHIC STUDIES: I have personally reviewed the radiological images as listed and agreed with the findings in the report. Ct Chest, Abdomen Pelvis W contrast 12/17/2014 IMPRESSION: 1. Large amount of free air and free fluid within the upper abdomen follow-up rarely about the liver, compatible with acute perforation. This likely reflects perforation at the level of the large colonic malignancy, though the site of perforation is not definitely characterized on this study. 2. Large left splenic flexure colonic mass again noted, with diffuse surrounding nodularity, reflecting local spread of disease. Extensive surrounding soft tissue inflammation is mildly more prominent than on the prior study. 3. Small left pleural effusion, new from the recent prior study, likely reactive secondary to bowel perforation. Bibasilar atelectasis noted. Lungs otherwise clear. 4. Scattered coronary artery calcifications seen. 5. Small pericardial effusion again noted. 6. 2.5 cm left adrenal lesion again raises concern for metastasis.  CT abdomen and pelvis with contrast on 01/24/2015 IMPRESSION: 1. Ill-defined soft tissue within the left upper abdominal quadrant worrisome for  residual and/or locally recurrent disease with dominant residual soft tissue measuring approximately 10.4 cm in diameter. 2. Findings compatible with progression of metastatic disease with worsening retroperitoneal, porta-hepatis and mesenteric adenopathy and interval increase in size of suspected left adrenal gland metastasis. 3. Interval development of an approximately 1.3 cm liver lesion compatible with hepatic metastatic disease. 4. Post hemicolectomy and end ileostomy creation without evidence of enteric obstruction. Note of definable/drainable intra-abdominal fluid collection. 5. Persistent open  midline abdominal wound without associated hernia.   ASSESSMENT & PLAN:  74 year old female   1. Metastatic colon cancer, [id number], stage IV, with peritoneal, liver and left adrenal metastasis. MSI-H, BRAF V600E1 (+)  -she has sporadic Lynch syndrome,  based on her MS I and BRAFmutation test results.  -I reviewed her scan and surgical path findings. Unfortunately she has metastatic disease, not curable, and overall prognosis is extremly poor, due to her severe complications after surgery, poor performance status and the rapid disease progression.  -We discussed the option of chemotherapy, given her poor performance status , she not a good candidate for chemotherapy.  -We discussed the alternative option of immunotherapy. Studies have shown and good response to PDL 1 inhibitor in patients with MSI high colon cancer. Pembrolizumab was approved by her insurance. She expressed her wish to try, and has started last week and tolerated it well. - we'll hold on her treatment for now due to her AKI  2. AKI -Baseline creatinine normal, 1.5 last week, 3.5 today. -Possible dehydration, ruled out other etiology -I recommend her to begin admitted to hospital for hydration and workup   2. Worsening left abdominal pain, secondary to her metastatic disease -I'll increase her  morphine ER to 30  milligrams every 8 hours -Continue morphine IR 15-$RemoveBefore'30mg'[id number]$  every 4 hours  3. Right foot ischemia, status post BKA -on coumadin which was held 4 days ago for port tomorrow  -INR 6.74 today likely related AKI  -hold on coumadin for now   4. Anorexia, weight loss -Dietitian consult, social work referral -We'll give her free sample of Ensure today  5. Probable left pleural effusion -Continue monitoring.  6. Oral thrush -Nystatin and lidocaine mouthwash  Plan: -Direct hospital admission for AKI, I spoke with Dr. Karleen Hampshire who kindly accepted her  -Hold Coumadin giving the supratherapeutic INR, if she is able to get a port placement during her hospital stay, may need vitk to revise INR  -oral nystatin and lidocaine most wash -Pain medication adjustment   All questions were answered. The patient knows to call the clinic with any problems, questions or concerns. I spent 15 minutes counseling the patient face to face. The total time spent in the appointment was 20 minutes and more than 50% was on counseling.     Truitt Merle, MD 01/17/2015 2:03 PM

## 2015-02-13 NOTE — Progress Notes (Unsigned)
Panic INR reported to Elray Buba at 1430 by DCM.

## 2015-02-13 NOTE — H&P (Signed)
[facility] History and Physical  LEYLI KEVORKIAN CLE:[id number] DOB: 07/11/1941 DOA: 01/26/2015  Referring physician: Dr. Burr Medico PCP: No PCP Per Patient  Specialists:   Chief Complaint: Abd pain, ARF  HPI: Kathryn Knapp is a 74 y.o. female  With a hx of metastatic colon cancer, DM, HTN, depression who presented to Oncology on 6/28 with abd pain and decreased appetite. Patient was recently discharged on 6/11 following an admission for UTI with sepsis.  Today in the office, pt was found to have Cr of 3.5 (was 1.5 on 6/21). Patient was referred to [facility] for direct admission for likely dehydration secondary to poor intake.  Review of Systems:  Review of Systems  Constitutional: Positive for malaise/fatigue. Negative for chills and diaphoresis.  HENT: Negative for ear pain and tinnitus.   Eyes: Negative for pain and discharge.  Respiratory: Negative for sputum production and wheezing.   Cardiovascular: Negative for orthopnea and claudication.  Gastrointestinal: Positive for nausea, vomiting and abdominal pain.  Genitourinary: Negative for frequency and hematuria.  Musculoskeletal: Negative for back pain and neck pain.  Neurological: Negative for tingling, tremors and loss of consciousness.  Psychiatric/Behavioral: Negative for hallucinations. The patient is not nervous/anxious.      Past Medical History  Diagnosis Date  . Diabetes mellitus without complication   . Hypertension   . GERD (gastroesophageal reflux disease)   . Depression   . Colon cancer metastasized to multiple sites    Past Surgical History  Procedure Laterality Date  . Cholecystectomy    . Abdominal hysterectomy    . Laparotomy N/A 12/17/2014    Procedure: EXPLORATORY LAPAROTOMY Right, Transverse, partial left colectomy with en bloc partial gastrectomy illeostomy;  Surgeon: Georganna Skeans, MD;  Location: [place];  Service: General;  Laterality: N/A;  . Amputation Right 12/28/2014    Procedure: AMPUTATION BELOW KNEE;   Surgeon: Conrad Inkerman, MD;  Location: [place];  Service: Vascular;  Laterality: Right;   Social History:  reports that she quit smoking about 24 years ago. Her smoking use included Cigarettes. She has a 80 pack-year smoking history. She does not have any smokeless tobacco history on file. She reports that she does not drink alcohol or use illicit drugs.  where does patient live--home, ALF, SNF? and with whom if at home?  Can patient participate in ADLs?  No Known Allergies  Family History  Problem Relation Age of Onset  . Pneumonia Brother   . Heart attack Sister     (be sure to complete)  Prior to Admission medications   Medication Sig Start Date End Date Taking? Authorizing Provider  acetaminophen (TYLENOL) 325 MG tablet Take 1-2 tablets (325-650 mg total) by mouth every 4 (four) hours as needed for mild pain (or temp >/= 101 F). 01/01/15   Charlesetta Shanks, MD  docusate sodium (COLACE) 100 MG capsule Take 1 capsule (100 mg total) by mouth daily. 01/01/15   Charlesetta Shanks, MD  feeding supplement, ENSURE ENLIVE, (ENSURE ENLIVE) LIQD Take 237 mLs by mouth 2 (two) times daily between meals. 01/27/15   Velvet Bathe, MD  FLUoxetine (PROZAC) 20 MG capsule Take 1 capsule (20 mg total) by mouth daily. 01/19/15   Lavon Paganini Angiulli, PA-C  gabapentin (NEURONTIN) 100 MG capsule Take 1 capsule (100 mg total) by mouth 3 (three) times daily. 01/19/15   Lavon Paganini Angiulli, PA-C  LORazepam (ATIVAN) 0.5 MG tablet Take 1-2 tablets (0.5-1 mg total) by mouth every 8 (eight) hours. 02/02/15   Krista Blue  Burr Medico, MD  methocarbamol (ROBAXIN) 500 MG tablet Take 1 tablet (500 mg total) by mouth every 6 (six) hours as needed for muscle spasms. 01/19/15   Lavon Paganini Angiulli, PA-C  morphine (MS CONTIN) 15 MG 12 hr tablet Take 1 tablet (15 mg total) by mouth every 12 (twelve) hours. 01/27/15   Truitt Merle, MD  morphine (MSIR) 15 MG tablet Take 1 tablet (15 mg total) by mouth every 4 (four) hours as needed for severe pain. 02/02/15   Truitt Merle, MD   pantoprazole (PROTONIX) 40 MG tablet Take 1 tablet (40 mg total) by mouth daily. 01/19/15   Lavon Paganini Angiulli, PA-C  prochlorperazine (COMPAZINE) 10 MG tablet Take 1 tablet (10 mg total) by mouth every 8 (eight) hours as needed for nausea or vomiting. 02/06/15   Truitt Merle, MD  sulfamethoxazole-trimethoprim (BACTRIM DS,SEPTRA DS) 800-160 MG per tablet Take 1 tablet by mouth 2 (two) times daily. 02/02/15   Truitt Merle, MD  warfarin (COUMADIN) 5 MG tablet Take 1 tablet (5 mg total) by mouth daily. 02/02/15   Truitt Merle, MD   Physical Exam: Filed Vitals:   01/27/2015 1545  BP: 129/66  Pulse: 79  Temp: 98 F (36.7 C)  TempSrc: Oral  Resp: 18  Height: 5\' 11"  (1.803 m)  Weight: 50.1 kg (110 lb 7.2 oz)  SpO2: 99%     General:  Awake, in nad  Eyes: PERRL B  ENT: membranes dry, dentition fair  Neck: trachea midline, neck supple  Cardiovascular: regular, s1, s2  Respiratory: normal resp effort, no wheezing  Abdomen: soft,nondistended  Skin: decreased skin turgor, no abnormal skin lesions seen  Musculoskeletal: perfused, no clubbing, s/p R BKA  Psychiatric: mood/affect normal//no auditory/visual hallucinations  Neurologic: cn2-12 grossly intact, strength/sensation intact  Labs on Admission:  Basic Metabolic Panel:  Recent Labs Lab 01/31/2015 1243  NA 134*  K 4.6  CO2 20*  GLUCOSE 126  BUN 60.0*  CREATININE 3.5*  CALCIUM 10.0   Liver Function Tests:  Recent Labs Lab 02/09/2015 1243  AST 25  ALT 10  ALKPHOS 140  BILITOT 0.34  PROT 8.3  ALBUMIN 3.1*   No results for input(s): LIPASE, AMYLASE in the last 168 hours. No results for input(s): AMMONIA in the last 168 hours. CBC:  Recent Labs Lab 02/15/2015 1243  WBC 9.1  NEUTROABS 6.9*  HGB 12.7  HCT 39.6  MCV 85.0  PLT 592*   Cardiac Enzymes: No results for input(s): CKTOTAL, CKMB, CKMBINDEX, TROPONINI in the last 168 hours.  BNP (last 3 results) No results for input(s): BNP in the last 8760 hours.  ProBNP (last 3  results) No results for input(s): PROBNP in the last 8760 hours.  CBG: No results for input(s): GLUCAP in the last 168 hours.  Radiological Exams on Admission: No results found.   Assessment/Plan Principal Problem:   Acute renal failure syndrome Active Problems:   Bowel obstruction   Thrombus   Colon cancer metastasized to multiple sites   Depression   Abdominal pain   Diabetes mellitus without complication  1. ARF 1. Likely secondary to dehydration 2. Will cont on IVF as tolerated  3. Avoid nephrotoxic agents 2. Abd pain with nausea 1. Continue with analgesics as tolerated 2. Likely secondary to progressive metastatic disease 3. Will obtain abd xray to r/o obstruction/constipation 4. Patient is eager to start clears. Will order 3. DM2 1. Cont on SSI coverage 4. Metastatic colon cancer 1. Followed by Dr. Burr Medico 2. Will notify Oncology 5. Hx  RLE ischemia 1. Pt had been continued on therapeutic anticoagulation 2. INR is elevated at just under 7 3. No signs of bleeding.  4. Will consult pharmacy for coumadin dosing - would hold coumadin until levels therapeutic 6. Depression 1. Stable 7. DVT prophylaxis 1. Coumadin per pharmacy dosing  Code Status: Full  Family Communication: Pt in room  Disposition Plan: [place], [place] [facility] Pager [phone]  If 7PM-7AM, please contact night-coverage [email or web address] Password [facility] 02/12/2015, 3:53 PM

## 2015-02-14 ENCOUNTER — Inpatient Hospital Stay (HOSPITAL_COMMUNITY): Payer: Medicare Other

## 2015-02-14 ENCOUNTER — Ambulatory Visit (HOSPITAL_COMMUNITY): Admission: RE | Admit: 2015-02-14 | Payer: Medicare Other | Source: Ambulatory Visit

## 2015-02-14 ENCOUNTER — Inpatient Hospital Stay (HOSPITAL_COMMUNITY): Admission: RE | Admit: 2015-02-14 | Payer: Medicare Other | Source: Ambulatory Visit

## 2015-02-14 DIAGNOSIS — Z7901 Long term (current) use of anticoagulants: Secondary | ICD-10-CM

## 2015-02-14 DIAGNOSIS — R630 Anorexia: Secondary | ICD-10-CM

## 2015-02-14 DIAGNOSIS — C185 Malignant neoplasm of splenic flexure: Secondary | ICD-10-CM

## 2015-02-14 DIAGNOSIS — R1084 Generalized abdominal pain: Secondary | ICD-10-CM

## 2015-02-14 DIAGNOSIS — N179 Acute kidney failure, unspecified: Secondary | ICD-10-CM

## 2015-02-14 DIAGNOSIS — J90 Pleural effusion, not elsewhere classified: Secondary | ICD-10-CM

## 2015-02-14 DIAGNOSIS — B370 Candidal stomatitis: Secondary | ICD-10-CM

## 2015-02-14 DIAGNOSIS — G893 Neoplasm related pain (acute) (chronic): Secondary | ICD-10-CM

## 2015-02-14 DIAGNOSIS — C787 Secondary malignant neoplasm of liver and intrahepatic bile duct: Secondary | ICD-10-CM

## 2015-02-14 LAB — COMPREHENSIVE METABOLIC PANEL
ALK PHOS: 93 U/L (ref 38–126)
ALT: 8 U/L — AB (ref 14–54)
AST: 16 U/L (ref 15–41)
Albumin: 2.9 g/dL — ABNORMAL LOW (ref 3.5–5.0)
Anion gap: 10 (ref 5–15)
BILIRUBIN TOTAL: 0.3 mg/dL (ref 0.3–1.2)
BUN: 65 mg/dL — AB (ref 6–20)
CHLORIDE: 103 mmol/L (ref 101–111)
CO2: 21 mmol/L — ABNORMAL LOW (ref 22–32)
Calcium: 8.7 mg/dL — ABNORMAL LOW (ref 8.9–10.3)
Creatinine, Ser: 3.11 mg/dL — ABNORMAL HIGH (ref 0.44–1.00)
GFR calc Af Amer: 16 mL/min — ABNORMAL LOW (ref >60)
GFR calc non Af Amer: 14 mL/min — ABNORMAL LOW (ref >60)
GLUCOSE: 88 mg/dL (ref 65–99)
POTASSIUM: 5.1 mmol/L (ref 3.5–5.1)
SODIUM: 134 mmol/L — AB (ref 135–145)
TOTAL PROTEIN: 6.9 g/dL (ref 6.5–8.1)

## 2015-02-14 LAB — CBC
HEMATOCRIT: 35.6 % — AB (ref 36.0–46.0)
HEMOGLOBIN: 11.3 g/dL — AB (ref 12.0–15.0)
MCH: 27.8 pg (ref 26.0–34.0)
MCHC: 31.7 g/dL (ref 30.0–36.0)
MCV: 87.5 fL (ref 78.0–100.0)
Platelets: 444 10*3/uL — ABNORMAL HIGH (ref 150–400)
RBC: 4.07 MIL/uL (ref 3.87–5.11)
RDW: 18.9 % — ABNORMAL HIGH (ref 11.5–15.5)
WBC: 7.4 10*3/uL (ref 4.0–10.5)

## 2015-02-14 LAB — PROTIME-INR
INR: 7.09 — AB (ref 0.00–1.49)
PROTHROMBIN TIME: 58.4 s — AB (ref 11.6–15.2)

## 2015-02-14 MED ORDER — DEXTROSE 5 % IV SOLN
4.0000 mg | Freq: Once | INTRAVENOUS | Status: AC
  Administered 2015-02-14: 4 mg via INTRAVENOUS
  Filled 2015-02-14: qty 0.4

## 2015-02-14 MED ORDER — BOOST / RESOURCE BREEZE PO LIQD
1.0000 | Freq: Three times a day (TID) | ORAL | Status: DC
  Administered 2015-02-15 – 2015-02-16 (×5): 1 via ORAL

## 2015-02-14 MED ORDER — SODIUM CHLORIDE 0.9 % IV BOLUS (SEPSIS)
1000.0000 mL | INTRAVENOUS | Status: DC | PRN
  Administered 2015-02-14: 1000 mL via INTRAVENOUS
  Filled 2015-02-14: qty 1000

## 2015-02-14 NOTE — Progress Notes (Signed)
Initial Nutrition Assessment  DOCUMENTATION CODES:  Severe malnutrition in context of chronic illness, Underweight  INTERVENTION: - Will order Resource Breeze TID, each supplement provides 250 kcal and 9 grams of protein - RD will continue to monitor for needs  NUTRITION DIAGNOSIS:  Increased nutrient needs related to cancer and cancer related treatments as evidenced by estimated needs.  GOAL:  Patient will meet greater than or equal to 90% of their needs  MONITOR:  PO intake, Supplement acceptance, Weight trends, Labs, I & O's  REASON FOR ASSESSMENT:  Malnutrition Screening Tool  ASSESSMENT: 74 y.o. female with a hx of metastatic colon cancer, DM, HTN, depression who presented to Oncology on 6/28 with abd pain and decreased appetite. Patient was recently discharged on 6/11 following an admission for UTI with sepsis.  Pt seen for MST. BMI indicates underweight status. Pt sleeping at time of visit; talked with grand-daughter who provided all information. She states that pt consumed a small amount of breakfast this AM but stated that everything is very bland and tasteless. PTA, [facility] RD was able to provide pt with Ensure for home use and grand daughter states pt was consuming this but was not eating well most days.  Severe muscle and fat wasting noted. Weight trends indicate 15 lb weight loss (12% body weight) in the past 20 days which is significant for time frame. Not meeting needs. Medications reviewed. Labs reviewed; CBGs: 88-93 mg/dL, Na: 134 mmol/L, BUN/creatinine elevated, GFR: 14.  Height:  Ht Readings from Last 1 Encounters:  02/04/2015 5\' 11"  (1.803 m)    Weight:  Wt Readings from Last 1 Encounters:  02/14/2015 110 lb 7.2 oz (50.1 kg)    Ideal Body Weight:  70.5 kg (kg)  Wt Readings from Last 10 Encounters:  01/28/2015 110 lb 7.2 oz (50.1 kg)  01/24/15 125 lb 10.6 oz (57 kg)  01/19/15 126 lb 1.6 oz (57.199 kg)  12/29/14 194 lb 3.6 oz (88.1 kg)  04/14/13 198 lb  (89.812 kg)  09/21/12 192 lb (87.091 kg)    BMI:  Body mass index is 15.41 kg/(m^2).  Estimated Nutritional Needs:  Kcal:  1600-1800  Protein:  65-80 grams  Fluid:  2.2-2.5 L/day  Skin:  Wound (see comment) (Stage 2 sacral pressure ulcer, incisions to abdomen and R leg)  Diet Order:  Diet clear liquid Room service appropriate?: Yes; Fluid consistency:: Thin  EDUCATION NEEDS:  No education needs identified at this time   Intake/Output Summary (Last 24 hours) at 02/14/15 1051 Last data filed at 02/14/15 0830  Gross per 24 hour  Intake 1008.75 ml  Output    100 ml  Net 908.75 ml    Last BM:  6/28     Jarome Matin, RD, LDN Inpatient Clinical Dietitian Pager # [phone] After hours/weekend pager # [phone]

## 2015-02-14 NOTE — Progress Notes (Signed)
Patient unable to void since last night.  Bladder scan- 338ml. Dr. Wynelle Cleveland ordered for bolus and increased IVF. So far she has still not voided.  Will pass information to night shift nurse to follow up.

## 2015-02-14 NOTE — Progress Notes (Signed)
Kathryn Knapp   DOB:07-23-1941   MP#:[id number]   QMG#:[id number]  Subjective: The patient is from my office directly yesterday for acute renal failure,  throat and abdominal pain.  She feels better this morning. Creatinine came down to 3.1, urine output is still low,no fever    Objective:  Filed Vitals:   02/14/15 0631  BP: 104/51  Pulse: 68  Temp: 97.4 F (36.3 C)  Resp: 18    Body mass index is 15.41 kg/(m^2).  Intake/Output Summary (Last 24 hours) at 02/14/15 0941 Last data filed at 02/14/15 0830  Gross per 24 hour  Intake 1008.75 ml  Output    100 ml  Net 908.75 ml     Sclerae unicteric  Oropharynx clear  No peripheral adenopathy  Lungs clear -- no rales or rhonchi  Heart regular rate and rhythm  Abdomen (+) tenderness at left upper quadrant  MSK no focal spinal tenderness, no peripheral edema  Neuro nonfocal   CBG (last 3)  No results for input(s): GLUCAP in the last 72 hours.   Labs:  Lab Results  Component Value Date   WBC 7.4 02/14/2015   HGB 11.3* 02/14/2015   HCT 35.6* 02/14/2015   MCV 87.5 02/14/2015   PLT 444* 02/14/2015   NEUTROABS 6.9* 02/05/2015    @LASTCHEMISTRY @  Urine Studies No results for input(s): UHGB, CRYS in the last 72 hours.  Invalid input(s): UACOL, UAPR, USPG, UPH, UTP, UGL, UKET, UBIL, UNIT, UROB, ULEU, UEPI, UWBC, URBC, UBAC, CAST, UCOM, BILUA  Basic Metabolic Panel:  Recent Labs Lab 01/31/2015 1243 02/14/15 0507  NA 134* 134*  K 4.6 5.1  CL  --  103  CO2 20* 21*  GLUCOSE 126 88  BUN 60.0* 65*  CREATININE 3.5* 3.11*  CALCIUM 10.0 8.7*   GFR Estimated Creatinine Clearance: 12.7 mL/min (by C-G formula based on Cr of 3.11). Liver Function Tests:  Recent Labs Lab 02/02/2015 1243 02/14/15 0507  AST 25 16  ALT 10 8*  ALKPHOS 140 93  BILITOT 0.34 0.3  PROT 8.3 6.9  ALBUMIN 3.1* 2.9*   No results for input(s): LIPASE, AMYLASE in the last 168 hours. No results for input(s): AMMONIA in the last 168 hours. Coagulation  profile  Recent Labs Lab 01/17/2015 1243 02/11/2015 1306 02/14/15 0507  INR Sent out for confirmation 6.74* 7.09*  PROTIME Sent out for confirmation.  --   --     CBC:  Recent Labs Lab 01/26/2015 1243 02/14/15 0507  WBC 9.1 7.4  NEUTROABS 6.9*  --   HGB 12.7 11.3*  HCT 39.6 35.6*  MCV 85.0 87.5  PLT 592* 444*   Cardiac Enzymes: No results for input(s): CKTOTAL, CKMB, CKMBINDEX, TROPONINI in the last 168 hours. BNP: Invalid input(s): POCBNP CBG: No results for input(s): GLUCAP in the last 168 hours. D-Dimer No results for input(s): DDIMER in the last 72 hours. Hgb A1c No results for input(s): HGBA1C in the last 72 hours. Lipid Profile No results for input(s): CHOL, HDL, LDLCALC, TRIG, CHOLHDL, LDLDIRECT in the last 72 hours. Thyroid function studies No results for input(s): TSH, T4TOTAL, T3FREE, THYROIDAB in the last 72 hours.  Invalid input(s): FREET3 Anemia work up No results for input(s): VITAMINB12, FOLATE, FERRITIN, TIBC, IRON, RETICCTPCT in the last 72 hours. Microbiology No results found for this or any previous visit (from the past 240 hour(s)).    Studies:  Dg Abd Portable 1v  02/03/2015   CLINICAL DATA:  Lower abdominal and pelvic pain for 1 day.  Nausea and vomiting. Constipation.  EXAM: PORTABLE ABDOMEN - 1 VIEW  COMPARISON:  CT scan dated 01/24/2015  FINDINGS: There are no dilated loops of bowel. The patient has had partial colectomy. The distal colon is not distended. Persistent large left pleural effusion. Scarring or atelectasis at the right base, unchanged.  Clips in the right upper quadrant from previous cholecystectomy.  No acute osseous abnormality.  IMPRESSION: No acute abnormalities of the abdomen. Persistent large left pleural effusion.   Electronically Signed   By: Lorriane Shire M.D.   On: 02/03/2015 18:35    Assessment: 74 y.o.  1. Acute renal failure, likely secondary to poor by mouth intake, need to rule out obstructive uropathy 2. Stage IV  colon cancer, on chemo (pembrolizumab)  3. Worsening left abdominal pain, secondary to metastatic disease 4. Oral thrush 5. Right foot ischemia, status post BKA. 6. Coagulopathy, secondary to Coumadin and AKI 7. Thrombocytosis, likely reactive 8. Left pleural effusion 9. Anorexia and weight loss  Recommendations: -Please obtain a renal ultrasound to ruled out obstructive uropathy from her large abdominal mass -Please obtain a chest x-ray to evaluate her pleural effusion, consider thoracentesis if his large pleural effusion. She is moderately symptomatic -Please correct her CBC with vitamin K. She was scheduled to have a port placement by IR this morning, which was canceled due to the coagulopathy. If possible, please get a port placed by IR due to her hospital stay -Continue IV fluids, pain management, and mouthwash.  I will follow her. Please call me if you have questions.  Truitt Merle  02/14/2015  Cell [phone]    Truitt Merle, MD 02/14/2015  9:41 AM

## 2015-02-14 NOTE — Progress Notes (Addendum)
[facility] Progress Note   Kathryn Knapp MWN:[id number] DOB: [birth year] DOA: 02/08/2015 PCP: No PCP Per Patient  Brief narrative: Kathryn Knapp is a 74 y.o. female with metastatic colon cancer, DM, HTN and depression who has had abdominal pain and decreased appetite. She is found to have ARF in the oncologist office and referred for admission.    Subjective: Pain currently controlled. Tolerating clear liquids.   Assessment/Plan: Principal Problem:   Acute renal failure syndrome - Continue IV fluids-given another fluid bolus of 1 L normal saline -Abdominal ultrasound to assess for obstructive uropathy ordered  Active Problems:  Abdominal pain with nausea and vomiting -Possibly secondary to metastatic colon cancer-oncology following    Colon cancer metastasized to multiple sites -Oncology has evaluated the patient  History of peripheral vascular disease -On Coumadin-her supratherapeutic-Dr. fang asked for it to be reversed so she can receive a Port-A-Cath -We'll give vitamin K 5 mg and recheck INR tomorrow  Left pleural effusion -Chronic-she is asymptomatic and minimally mobile at baseline therefore does not have issues with shortness of breath on exertion --no need to tap at this time    Depression - cont prozac    Code Status: Full code DVT prophylaxis: Supratherapeutic on Coumadin Consultants: Oncology Procedures:  Antibiotics: Anti-infectives    None      Objective: Filed Weights   02/12/2015 1545  Weight: 50.1 kg (110 lb 7.2 oz)    Intake/Output Summary (Last 24 hours) at 02/14/15 1745 Last data filed at 02/14/15 1548  Gross per 24 hour  Intake 1826.25 ml  Output    225 ml  Net 1601.25 ml     Vitals Filed Vitals:   02/09/2015 1545 02/02/2015 2111 02/14/15 0631 02/14/15 1431  BP: 129/66 109/49 104/51 107/59  Pulse: 79 73 68 76  Temp: 98 F (36.7 C)  97.4 F (36.3 C) 97.8 F (36.6 C)  TempSrc: Oral  Oral Oral  Resp: 18 18 18 18   Height: 5'  11" (1.803 m)     Weight: 50.1 kg (110 lb 7.2 oz)     SpO2: 99% 97% 95% 97%    Exam:  General:  Pt is alert, not in acute distress  HEENT: No icterus, No thrush, oral mucosa moist  Cardiovascular: regular rate and rhythm, S1/S2 No murmur  Respiratory: clear to auscultation bilaterally   Abdomen: Soft, +Bowel sounds, non tender, non distended, no guarding  MSK: No LE edema, cyanosis or clubbing  Data Reviewed: Basic Metabolic Panel:  Recent Labs Lab 01/25/2015 1243 02/14/15 0507  NA 134* 134*  K 4.6 5.1  CL  --  103  CO2 20* 21*  GLUCOSE 126 88  BUN 60.0* 65*  CREATININE 3.5* 3.11*  CALCIUM 10.0 8.7*   Liver Function Tests:  Recent Labs Lab 02/04/2015 1243 02/14/15 0507  AST 25 16  ALT 10 8*  ALKPHOS 140 93  BILITOT 0.34 0.3  PROT 8.3 6.9  ALBUMIN 3.1* 2.9*   No results for input(s): LIPASE, AMYLASE in the last 168 hours. No results for input(s): AMMONIA in the last 168 hours. CBC:  Recent Labs Lab 02/07/2015 1243 02/14/15 0507  WBC 9.1 7.4  NEUTROABS 6.9*  --   HGB 12.7 11.3*  HCT 39.6 35.6*  MCV 85.0 87.5  PLT 592* 444*   Cardiac Enzymes: No results for input(s): CKTOTAL, CKMB, CKMBINDEX, TROPONINI in the last 168 hours. BNP (last 3 results) No results for input(s): BNP in the last 8760 hours.  ProBNP (last 3 results)  No results for input(s): PROBNP in the last 8760 hours.  CBG: No results for input(s): GLUCAP in the last 168 hours.  No results found for this or any previous visit (from the past 240 hour(s)).   Studies: Dg Chest Port 1 View  02/14/2015   CLINICAL DATA:  Shortness of breath and renal failure.  EXAM: PORTABLE CHEST - 1 VIEW  COMPARISON:  01/24/2015  FINDINGS: Increased densities in the left chest likely represent an enlarging left pleural effusion. The pleural effusion is moderate to large in size. Evidence for compressive atelectasis in the left lung. Heart size is difficult to evaluate due to the left chest densities. The right  lung is clear.  IMPRESSION: Increased densities in the left chest are suggestive for an enlarging left pleural effusion with compressive atelectasis.   Electronically Signed   By: Markus Daft M.D.   On: 02/14/2015 10:47   Dg Abd Portable 1v  01/27/2015   CLINICAL DATA:  Lower abdominal and pelvic pain for 1 day. Nausea and vomiting. Constipation.  EXAM: PORTABLE ABDOMEN - 1 VIEW  COMPARISON:  CT scan dated 01/24/2015  FINDINGS: There are no dilated loops of bowel. The patient has had partial colectomy. The distal colon is not distended. Persistent large left pleural effusion. Scarring or atelectasis at the right base, unchanged.  Clips in the right upper quadrant from previous cholecystectomy.  No acute osseous abnormality.  IMPRESSION: No acute abnormalities of the abdomen. Persistent large left pleural effusion.   Electronically Signed   By: Lorriane Shire M.D.   On: 01/26/2015 18:35    Scheduled Meds:  Scheduled Meds: . docusate sodium  100 mg Oral Daily  . feeding supplement (RESOURCE BREEZE)  1 Container Oral TID BM  . FLUoxetine  20 mg Oral Daily  . gabapentin  100 mg Oral TID  . LORazepam  0.5-1 mg Oral 3 times per day  . morphine  30 mg Oral Q8H  . nystatin  5 mL Mouth/Throat QID  . pantoprazole  40 mg Oral Daily  . sodium chloride  3 mL Intravenous Q12H  . Warfarin - Pharmacist Dosing Inpatient   Does not apply q1800   Continuous Infusions: . sodium chloride 125 mL/hr at 02/14/15 1659    Time spent on care of this patient:35 min   [place], MD 02/14/2015, 5:45 PM  LOS: 1 day   [facility] Office  [phone] Pager - Text Page per [email or web address] If 7PM-7AM, please contact night-coverage [email or web address]

## 2015-02-14 NOTE — Care Management Note (Signed)
Case Management Note  Patient Details  Name: Kathryn Knapp MRN: [id number] Date of Birth: Apr 17, 1941  Subjective/Objective:   Pt admitted with Acute renal failure                 Action/Plan:from home   Expected Discharge Date:   (unknown)               Expected Discharge Plan:  Home/Self Care  In-House Referral:     Discharge planning Services  CM Consult  Post Acute Care Choice:    Choice offered to:     DME Arranged:    DME Agency:     HH Arranged:    [place] Agency:     Status of Service:  In process, will continue to follow  Medicare Important Message Given:    Date Medicare IM Given:    Medicare IM give by:    Date Additional Medicare IM Given:    Additional Medicare Important Message give by:     If discussed at [place] of Stay Meetings, dates discussed:    Additional CommentsPurcell Mouton, RN 02/14/2015, 4:02 PM

## 2015-02-14 NOTE — Progress Notes (Signed)
Patient had a 20 beat run of SVT while sleeping and then returned to NSR. Dr. Wynelle Cleveland notified.

## 2015-02-14 NOTE — Progress Notes (Signed)
ANTICOAGULATION CONSULT NOTE - Initial Consult  Pharmacy Consult for Warfarin Indication: history of VTE  No Known Allergies  Patient Measurements: Height: 5\' 11"  (180.3 cm) Weight: 110 lb 7.2 oz (50.1 kg) IBW/kg (Calculated) : 70.8   Vital Signs: Temp: 97.4 F (36.3 C) (06/29 0631) Temp Source: Oral (06/29 0631) BP: 104/51 mmHg (06/29 0631) Pulse Rate: 68 (06/29 0631)  Labs:  Recent Labs  01/29/2015 1243 01/22/2015 1306 02/14/15 0507  HGB 12.7  --  11.3*  HCT 39.6  --  35.6*  PLT 592*  --  444*  LABPROT  --  56.2* 58.4*  INR Sent out for confirmation 6.74* 7.09*  CREATININE 3.5*  --  3.11*    Estimated Creatinine Clearance: 12.7 mL/min (by C-G formula based on Cr of 3.11).   Medical History: Past Medical History  Diagnosis Date  . Diabetes mellitus without complication   . Hypertension   . GERD (gastroesophageal reflux disease)   . Depression   . 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  Patient was recently discharged on 6/11 following an admission for UTI with sepsis.  Pharmacy consulted to manage warfarin inpatient. INR high (6.74) on admission.  On hold from 6/25 for planned port placement 6/29. Decreased PO intake PTA likely contributing to supratherapeutic INR and recently on course of bactrim.  Home regimen: 5mg  daily with 2.5mg  MWF  Today, 02/14/2015   INR = 7.09  CBC: Hgb slightly low following IVF, pltc WNL  Diet: Clears  No bleeding noted  Goal of Therapy:  INR 2-3   Plan:   Continue to hold warfarin.  [facility] has ordered vit K 4mg  IVPB x 1  Daily INR.  Await new plan for port placement  Doreene Eland, PharmD, BCPS.   Pager: [phone] 02/14/2015,7:25 AM

## 2015-02-15 DIAGNOSIS — R109 Unspecified abdominal pain: Secondary | ICD-10-CM

## 2015-02-15 DIAGNOSIS — C189 Malignant neoplasm of colon, unspecified: Secondary | ICD-10-CM

## 2015-02-15 DIAGNOSIS — Z515 Encounter for palliative care: Secondary | ICD-10-CM | POA: Insufficient documentation

## 2015-02-15 DIAGNOSIS — N19 Unspecified kidney failure: Secondary | ICD-10-CM

## 2015-02-15 DIAGNOSIS — D72829 Elevated white blood cell count, unspecified: Secondary | ICD-10-CM

## 2015-02-15 LAB — URINALYSIS, ROUTINE W REFLEX MICROSCOPIC
BILIRUBIN URINE: NEGATIVE
Glucose, UA: NEGATIVE mg/dL
Ketones, ur: NEGATIVE mg/dL
Leukocytes, UA: NEGATIVE
Nitrite: NEGATIVE
PH: 5 (ref 5.0–8.0)
PROTEIN: NEGATIVE mg/dL
Specific Gravity, Urine: 1.014 (ref 1.005–1.030)
UROBILINOGEN UA: 0.2 mg/dL (ref 0.0–1.0)

## 2015-02-15 LAB — BASIC METABOLIC PANEL
Anion gap: 9 (ref 5–15)
BUN: 59 mg/dL — ABNORMAL HIGH (ref 6–20)
CHLORIDE: 110 mmol/L (ref 101–111)
CO2: 20 mmol/L — ABNORMAL LOW (ref 22–32)
CREATININE: 2.47 mg/dL — AB (ref 0.44–1.00)
Calcium: 8.6 mg/dL — ABNORMAL LOW (ref 8.9–10.3)
GFR calc Af Amer: 21 mL/min — ABNORMAL LOW (ref >60)
GFR, EST NON AFRICAN AMERICAN: 18 mL/min — AB (ref >60)
Glucose, Bld: 86 mg/dL (ref 65–99)
Potassium: 4.7 mmol/L (ref 3.5–5.1)
Sodium: 139 mmol/L (ref 135–145)

## 2015-02-15 LAB — CBC
HCT: 35.5 % — ABNORMAL LOW (ref 36.0–46.0)
Hemoglobin: 10.7 g/dL — ABNORMAL LOW (ref 12.0–15.0)
MCH: 26.9 pg (ref 26.0–34.0)
MCHC: 30.1 g/dL (ref 30.0–36.0)
MCV: 89.2 fL (ref 78.0–100.0)
Platelets: 403 10*3/uL — ABNORMAL HIGH (ref 150–400)
RBC: 3.98 MIL/uL (ref 3.87–5.11)
RDW: 19.1 % — ABNORMAL HIGH (ref 11.5–15.5)
WBC: 6.6 10*3/uL (ref 4.0–10.5)

## 2015-02-15 LAB — URINE MICROSCOPIC-ADD ON

## 2015-02-15 LAB — PROTIME-INR
INR: 1.26 (ref 0.00–1.49)
Prothrombin Time: 16 seconds — ABNORMAL HIGH (ref 11.6–15.2)

## 2015-02-15 LAB — SODIUM, URINE, RANDOM

## 2015-02-15 LAB — CREATININE, URINE, RANDOM: Creatinine, Urine: 139.56 mg/dL

## 2015-02-15 MED ORDER — PANTOPRAZOLE SODIUM 40 MG IV SOLR
40.0000 mg | INTRAVENOUS | Status: DC
  Administered 2015-02-15 – 2015-02-16 (×2): 40 mg via INTRAVENOUS
  Filled 2015-02-15 (×2): qty 40

## 2015-02-15 MED ORDER — TRAMADOL HCL 50 MG PO TABS
100.0000 mg | ORAL_TABLET | Freq: Once | ORAL | Status: AC
  Administered 2015-02-15: 100 mg via ORAL
  Filled 2015-02-15: qty 2

## 2015-02-15 MED ORDER — SODIUM CHLORIDE 0.9 % IV BOLUS (SEPSIS)
500.0000 mL | Freq: Once | INTRAVENOUS | Status: AC
  Administered 2015-02-15: 500 mL via INTRAVENOUS

## 2015-02-15 MED ORDER — TRAMADOL HCL 50 MG PO TABS: 50.0000 mg | ORAL_TABLET | Freq: Four times a day (QID) | ORAL | Status: DC | PRN

## 2015-02-15 NOTE — Clinical Social Work Placement (Signed)
   CLINICAL SOCIAL WORK PLACEMENT  NOTE  Date:  02/15/2015  Patient Details  Name: Kathryn Knapp MRN: [id number] Date of Birth: [birth year]  Clinical Social Work is seeking post-discharge placement for this patient at the [place] level of care (*CSW will initial, date and re-position this form in  chart as items are completed):  No (pt daughter expressing interest in Development worker, community)   Patient/family provided with [facility] Work Department's list of facilities offering this level of care within the geographic area requested by the patient (or if unable, by the patient's family).  Yes   Patient/family informed of their freedom to choose among providers that offer the needed level of care, that participate in Medicare, Medicaid or managed care program needed by the patient, have an available bed and are willing to accept the patient.  No   Patient/family informed of [facility]'s ownership interest in [facility] and [facility], as well as of the fact that they are under no obligation to receive care at these facilities.  PASRR submitted to EDS on       PASRR number received on       Existing PASRR number confirmed on 02/15/15     FL2 transmitted to all facilities in geographic area requested by pt/family on 02/15/15     FL2 transmitted to all facilities within larger geographic area on       Patient informed that his/her managed care company has contracts with or will negotiate with certain facilities, including the following:            Patient/family informed of bed offers received.  Patient chooses bed at       Physician recommends and patient chooses bed at      Patient to be transferred to   on  .  Patient to be transferred to facility by       Patient family notified on   of transfer.  Name of family member notified:        PHYSICIAN Please sign FL2     Additional Comment:     _______________________________________________ Ladell Pier, LCSW 02/15/2015, 4:35 PM

## 2015-02-15 NOTE — Progress Notes (Signed)
Bladder scanned pt----481 cc noted, NP notified, I & O cath completed, 600 cc noted out via intermittent cath. SRP, RN

## 2015-02-15 NOTE — Progress Notes (Signed)
Patient c/o low back pain and not being able to urinate. Bladder scan done 285 ml noted retained in bladder.Last I&O cath done at 11 am   02/15/15 2036 02/15/15 2040  Vitals  Temp 97.5 F (36.4 C) --   Temp Source Oral --   BP (!) 91/43 mmHg (!) 85/45 mmHg  BP Method Automatic Manual  Patient Position (if appropriate) Lying --   Pulse Rate 81 --   Pulse Rate Source Dinamap --   Resp 18 --   Oxygen Therapy  SpO2 95 % --   O2 Device Nasal Cannula --   O2 Flow Rate (L/min) 2 L/min --    today and twice yesterday per report d/t pt's inablility to void. BP low 85/45 manually. On call NP paged and made aware. New orders received: NS bolus 500 ml; Tramadol for pain, insert foley. Will monitor closely.

## 2015-02-15 NOTE — Progress Notes (Signed)
Pt unable to void.  Bladder scan preformed----250cc's. Will make night shift RN aware.  Will continue to monitor closely.

## 2015-02-15 NOTE — Consult Note (Signed)
Consultation Note Date: 02/15/2015   Patient Name: Kathryn Knapp  DOB: 12-Dec-1940  MRN: [id number]  Age / Sex: 74 y.o., female   PCP: No Pcp Per Patient Referring Physician: Debbe Odea, MD  Reason for Consultation: Establishing goals of care  Palliative Care Assessment and Plan Summary of Established Goals of Care and Medical Treatment Preferences   MADISUN HARGROVE is a 74 y.o. female with metastatic colon cancer, DM, HTN and depression who has had abdominal pain and decreased appetite. She is found to have ARF in the oncologist office and referred for admission.  Patient has been admitted since 02/01/2015. Her hospital course has been complicated by acute renal failure, poor PO intake, large pleural effusion, in the context of stage IV colon cancer.   A palliative consult has been placed for code status and goals of care discussions. The patient is resting in bed with eyes closed, appears weak, cachectic. Does not verbalize much.   Family meeting with patient's 2 children, both daughters outside the patient's room. Discussed patient's progressive decline, discussed patient's current clinical conditions. Discussed SNF rehab versus home with hospice. Discussed DNR DNI in great detail. All questions answered. Daughters are tearful and will reflect on the information they have been given by the undersigned as well as Dr Burr Medico earlier today.   Palliative will follow up with them in am.  Continue current mode of care for now.   Contacts/Participants in Discussion: Primary Decision Maker:  Daughter Kathryn Knapp at [phone]. HCPOA: no   patient has 2 children, both her daughters are co decision makers.   Code Status/Advance Care Planning:  Discussed patient's current clinical conditions: Acute renal failure, stage IV Colon cancer, large pleural effusion and poor functional status. Discussed DNR DNI in great detail. Daughters are tearful and state that they have received similar information from Dr.  Burr Medico as well.    Palliative provider will follow up with daughters in am for further discussions about DNR DNI. Daughters strongly considering DNR DNI.   Symptom Management:   None acutely, continue to monitor.   Palliative Prophylaxis: yes  Additional Recommendations (Limitations, Scope, Preferences):  Discussed addition of hospice services, [place].  Psycho-social/Spiritual:   Support System: 2 daughters and several grand children  Desire for further Chaplaincy support:no  Prognosis: likely weeks to some very limited number of months at this point, patient is hospice eligible, discussed with 2 daughters.   Discharge Planning:  pending, probably home with hospice after daughters decide.     Values: shifting towards establishing DNR DNI, comfort care and hospice.  Life limiting illness: colon cancer.       Chief Complaint/History of Present Illness: adult failure to thrive  Primary Diagnoses  Present on Admission:  . Bowel obstruction . Thrombus . Colon cancer metastasized to multiple sites . Depression . Abdominal pain . Acute renal failure syndrome . ARF (acute renal failure)  Palliative Review of Systems: Noted.  I have reviewed the medical record, interviewed the patient and family, and examined the patient. The following aspects are pertinent.  Past Medical History  Diagnosis Date  . Diabetes mellitus without complication   . Hypertension   . GERD (gastroesophageal reflux disease)   . Depression   . Colon cancer metastasized to multiple sites    History   Social History  . Marital Status: Divorced    Spouse Name: N/A  . Number of Children: N/A  . Years of Education:  N/A   Social History Main Topics  . Smoking status: Former Smoker -- 2.00 packs/day for 40 years    Types: Cigarettes    Quit date: 11/17/1990  . Smokeless tobacco: Not on file  . Alcohol Use: No  . Drug Use: No  . Sexual  Activity: Not on file   Other Topics Concern  . None   Social History Narrative   Family History  Problem Relation Age of Onset  . Pneumonia Brother   . Heart attack Sister    Scheduled Meds: . docusate sodium  100 mg Oral Daily  . feeding supplement (RESOURCE BREEZE)  1 Container Oral TID BM  . FLUoxetine  20 mg Oral Daily  . gabapentin  100 mg Oral TID  . LORazepam  0.5-1 mg Oral 3 times per day  . nystatin  5 mL Mouth/Throat QID  . pantoprazole (PROTONIX) IV  40 mg Intravenous Q24H  . sodium chloride  3 mL Intravenous Q12H   Continuous Infusions: . sodium chloride 125 mL/hr at 02/15/15 1250   PRN Meds:.acetaminophen **OR** acetaminophen, bisacodyl, methocarbamol, morphine, morphine injection, ondansetron **OR** ondansetron (ZOFRAN) IV, prochlorperazine, sodium chloride Medications Prior to Admission:  Prior to Admission medications   Medication Sig Start Date End Date Taking? Authorizing Provider  acetaminophen (TYLENOL) 325 MG tablet Take 1-2 tablets (325-650 mg total) by mouth every 4 (four) hours as needed for mild pain (or temp >/= 101 F). 01/01/15  Yes Langley Gauss Moding, MD  docusate sodium (COLACE) 100 MG capsule Take 1 capsule (100 mg total) by mouth daily. 01/01/15  Yes Langley Gauss Moding, MD  feeding supplement, ENSURE ENLIVE, (ENSURE ENLIVE) LIQD Take 237 mLs by mouth 2 (two) times daily between meals. 01/27/15  Yes Velvet Bathe, MD  FLUoxetine (PROZAC) 20 MG capsule Take 1 capsule (20 mg total) by mouth daily. 01/19/15  Yes Daniel J Angiulli, PA-C  gabapentin (NEURONTIN) 100 MG capsule Take 1 capsule (100 mg total) by mouth 3 (three) times daily. 01/19/15  Yes Daniel J Angiulli, PA-C  LORazepam (ATIVAN) 0.5 MG tablet Take 1-2 tablets (0.5-1 mg total) by mouth every 8 (eight) hours. 02/02/15  Yes Truitt Merle, MD  methocarbamol (ROBAXIN) 500 MG tablet Take 1 tablet (500 mg total) by mouth every 6 (six) hours as needed for muscle spasms. 01/19/15  Yes Daniel J Angiulli, PA-C  morphine  (MS CONTIN) 15 MG 12 hr tablet Take 1 tablet (15 mg total) by mouth every 12 (twelve) hours. 01/27/15  Yes Truitt Merle, MD  morphine (MSIR) 15 MG tablet Take 1 tablet (15 mg total) by mouth every 4 (four) hours as needed for severe pain. 02/02/15  Yes Truitt Merle, MD  prochlorperazine (COMPAZINE) 10 MG tablet Take 1 tablet (10 mg total) by mouth every 8 (eight) hours as needed for nausea or vomiting. 02/06/15  Yes Truitt Merle, MD  pantoprazole (PROTONIX) 40 MG tablet Take 1 tablet (40 mg total) by mouth daily. 01/19/15   Lavon Paganini Angiulli, PA-C  warfarin (COUMADIN) 5 MG tablet Take 1 tablet (5 mg total) by mouth daily. 02/02/15   Truitt Merle, MD   No Known Allergies CBC:    Component Value Date/Time   WBC 6.6 02/15/2015 0350   WBC 9.1 01/31/2015 1243   HGB 10.7* 02/15/2015 0350   HGB 12.7 02/11/2015 1243   HCT 35.5* 02/15/2015 0350   HCT 39.6 01/20/2015 1243   PLT 403* 02/15/2015 0350   PLT 592* 02/09/2015 1243   MCV 89.2 02/15/2015 0350  MCV 85.0 01/25/2015 1243   NEUTROABS 6.9* 02/05/2015 1243   NEUTROABS 7.7 01/24/2015 1814   LYMPHSABS 1.5 01/23/2015 1243   LYMPHSABS 2.4 01/24/2015 1814   MONOABS 0.5 01/24/2015 1243   MONOABS 0.8 01/24/2015 1814   EOSABS 0.1 01/23/2015 1243   EOSABS 0.1 01/24/2015 1814   BASOSABS 0.1 01/25/2015 1243   BASOSABS 0.1 01/24/2015 1814   Comprehensive Metabolic Panel:    Component Value Date/Time   NA 139 02/15/2015 0350   NA 134* 01/18/2015 1243   K 4.7 02/15/2015 0350   K 4.6 01/23/2015 1243   CL 110 02/15/2015 0350   CO2 20* 02/15/2015 0350   CO2 20* 01/26/2015 1243   BUN 59* 02/15/2015 0350   BUN 60.0* 02/01/2015 1243   CREATININE 2.47* 02/15/2015 0350   CREATININE 3.5* 02/06/2015 1243   GLUCOSE 86 02/15/2015 0350   GLUCOSE 126 01/27/2015 1243   CALCIUM 8.6* 02/15/2015 0350   CALCIUM 10.0 01/26/2015 1243   AST 16 02/14/2015 0507   AST 25 01/31/2015 1243   ALT 8* 02/14/2015 0507   ALT 10 01/24/2015 1243   ALKPHOS 93 02/14/2015 0507   ALKPHOS 140  01/17/2015 1243   BILITOT 0.3 02/14/2015 0507   BILITOT 0.34 02/11/2015 1243   PROT 6.9 02/14/2015 0507   PROT 8.3 02/15/2015 1243   ALBUMIN 2.9* 02/14/2015 0507   ALBUMIN 3.1* 01/28/2015 1243    Physical Exam: Vital Signs: BP 101/40 mmHg  Pulse 81  Temp(Src) 97.7 F (36.5 C) (Oral)  Resp 16  Ht 5\' 11"  (1.803 m)  Wt 50.1 kg (110 lb 7.2 oz)  BMI 15.41 kg/m2  SpO2 93% SpO2: SpO2: 93 % O2 Device: O2 Device: Nasal Cannula O2 Flow Rate: O2 Flow Rate (L/min): 2 L/min Intake/output summary:  Intake/Output Summary (Last 24 hours) at 02/15/15 1649 Last data filed at 02/15/15 1116  Gross per 24 hour  Intake 1446.67 ml  Output    800 ml  Net 646.67 ml   LBM: Last BM Date: 02/14/15 Baseline Weight: Weight: 50.1 kg (110 lb 7.2 oz) Most recent weight: Weight: 50.1 kg (110 lb 7.2 oz)  Exam Findings:  Weak lady NAD currently Shallow clear lungs anteriorly  S1 S2 Abdomen soft No edema Does not verbalize much currently, will follow up in am                Palliative Performance Scale: 30%   Additional Data Reviewed: Recent Labs     02/14/15  0507  02/15/15  0350  WBC  7.4  6.6  HGB  11.3*  10.7*  PLT  444*  403*  NA  134*  139  BUN  65*  59*  CREATININE  3.11*  2.47*     Time In: 1600 Time Out: 1655 Time Total: 55 min Greater than 50%  of this time was spent counseling and coordinating care related to the above assessment and plan.  Signed by: Loistine Chance, MD [phone] Loistine Chance, MD  02/15/2015, 4:49 PM  Please contact Palliative Medicine Team phone at [phone] for questions and concerns.

## 2015-02-15 NOTE — Progress Notes (Signed)
[name] stat range between 87-90% on RA.  O2 applied at 2L and O2 stat went up 93%.  Bladder scan also performed due to no patient void and showed 156ml.  Will continue to monitor. Kathryn Knapp A

## 2015-02-15 NOTE — Progress Notes (Signed)
Pt stated that she felt like she needed to urinate, pt unable to void on bedpan.  Bladder scan preformed------350 cc noted.  Per MD verbal order preformed one time in and out cath.  200 cc's removed.  Will continue to monitor closely.

## 2015-02-15 NOTE — Progress Notes (Signed)
CAMILA MAITA   DOB:1941/03/18   EL#:[id number]   CHE#:[id number]  Subjective: Per patient's daughter and nursing staff, patient was quite lethargic yesterday, MS Contin was held. Patient states her abdominal pain is mild today, still has some sore throat, on a liquid diet. Her INR came down to normal after vitamin K.  Objective:  Filed Vitals:   02/15/15 0614  BP: 108/51  Pulse: 88  Temp: 97.5 F (36.4 C)  Resp: 20    Body mass index is 15.41 kg/(m^2).  Intake/Output Summary (Last 24 hours) at 02/15/15 1346 Last data filed at 02/15/15 1116  Gross per 24 hour  Intake 2264.17 ml  Output    925 ml  Net 1339.17 ml     Sclerae unicteric  Oropharynx clear  No peripheral adenopathy  Lungs clear -- no rales or rhonchi  Heart regular rate and rhythm  Abdomen (+) tenderness at left upper quadrant  MSK no focal spinal tenderness, no peripheral edema  Neuro nonfocal   CBG (last 3)  No results for input(s): GLUCAP in the last 72 hours.   Labs:  Lab Results  Component Value Date   WBC 6.6 02/15/2015   HGB 10.7* 02/15/2015   HCT 35.5* 02/15/2015   MCV 89.2 02/15/2015   PLT 403* 02/15/2015   NEUTROABS 6.9* 01/21/2015    @LASTCHEMISTRY @  Urine Studies No results for input(s): UHGB, CRYS in the last 72 hours.  Invalid input(s): UACOL, UAPR, USPG, UPH, UTP, UGL, UKET, UBIL, UNIT, UROB, ULEU, UEPI, UWBC, URBC, [place], CAST, [place], [place]  Basic Metabolic Panel:  Recent Labs Lab 02/14/2015 1243 02/14/15 0507 02/15/15 0350  NA 134* 134* 139  K 4.6 5.1 4.7  CL  --  103 110  CO2 20* 21* 20*  GLUCOSE 126 88 86  BUN 60.0* 65* 59*  CREATININE 3.5* 3.11* 2.47*  CALCIUM 10.0 8.7* 8.6*   GFR Estimated Creatinine Clearance: 16 mL/min (by C-G formula based on Cr of 2.47). Liver Function Tests:  Recent Labs Lab 02/10/2015 1243 02/14/15 0507  AST 25 16  ALT 10 8*  ALKPHOS 140 93  BILITOT 0.34 0.3  PROT 8.3 6.9  ALBUMIN 3.1* 2.9*   No results for input(s): LIPASE, AMYLASE in  the last 168 hours. No results for input(s): AMMONIA in the last 168 hours. Coagulation profile  Recent Labs Lab 02/11/2015 1243 02/04/2015 1306 02/14/15 0507 02/15/15 0350  INR Sent out for confirmation 6.74* 7.09* 1.26  PROTIME Sent out for confirmation.  --   --   --     CBC:  Recent Labs Lab 02/04/2015 1243 02/14/15 0507 02/15/15 0350  WBC 9.1 7.4 6.6  NEUTROABS 6.9*  --   --   HGB 12.7 11.3* 10.7*  HCT 39.6 35.6* 35.5*  MCV 85.0 87.5 89.2  PLT 592* 444* 403*   Cardiac Enzymes: No results for input(s): CKTOTAL, CKMB, CKMBINDEX, TROPONINI in the last 168 hours. BNP: Invalid input(s): POCBNP CBG: No results for input(s): GLUCAP in the last 168 hours. D-Dimer No results for input(s): DDIMER in the last 72 hours. Hgb A1c No results for input(s): HGBA1C in the last 72 hours. Lipid Profile No results for input(s): CHOL, HDL, LDLCALC, TRIG, CHOLHDL, LDLDIRECT in the last 72 hours. Thyroid function studies No results for input(s): TSH, T4TOTAL, T3FREE, THYROIDAB in the last 72 hours.  Invalid input(s): FREET3 Anemia work up No results for input(s): VITAMINB12, FOLATE, FERRITIN, TIBC, IRON, RETICCTPCT in the last 72 hours. Microbiology No results found for this or any  previous visit (from the past 240 hour(s)).    Studies:  US Renal  02/14/2015   CLINICAL DATA:  Renal failure. History of diabetes and hypertension. Initial encounter.  EXAM: RENAL / URINARY TRACT ULTRASOUND COMPLETE  COMPARISON:  Abdominal CT 01/24/2015.  FINDINGS: Right Kidney:  Length: 11.7 cm. Echogenicity within normal limits. No mass or hydronephrosis visualized. Mildly prominent echogenicity superiorly in the sinus fat has no definite correlate on recent CT and is probably artifactual.  Left Kidney:  Length: 10.7 cm. Echogenicity within normal limits. No mass or hydronephrosis visualized.  Bladder:  Appears normal for degree of bladder distention.  IMPRESSION: Normal renal ultrasound.  No hydronephrosis.    Electronically Signed   By: Richardean Sale M.D.   On: 02/14/2015 21:27   Dg Chest Port 1 View  02/14/2015   CLINICAL DATA:  Shortness of breath and renal failure.  EXAM: PORTABLE CHEST - 1 VIEW  COMPARISON:  01/24/2015  FINDINGS: Increased densities in the left chest likely represent an enlarging left pleural effusion. The pleural effusion is moderate to large in size. Evidence for compressive atelectasis in the left lung. Heart size is difficult to evaluate due to the left chest densities. The right lung is clear.  IMPRESSION: Increased densities in the left chest are suggestive for an enlarging left pleural effusion with compressive atelectasis.   Electronically Signed   By: Markus Daft M.D.   On: 02/14/2015 10:47   Dg Abd Portable 1v  01/27/2015   CLINICAL DATA:  Lower abdominal and pelvic pain for 1 day. Nausea and vomiting. Constipation.  EXAM: PORTABLE ABDOMEN - 1 VIEW  COMPARISON:  CT scan dated 01/24/2015  FINDINGS: There are no dilated loops of bowel. The patient has had partial colectomy. The distal colon is not distended. Persistent large left pleural effusion. Scarring or atelectasis at the right base, unchanged.  Clips in the right upper quadrant from previous cholecystectomy.  No acute osseous abnormality.  IMPRESSION: No acute abnormalities of the abdomen. Persistent large left pleural effusion.   Electronically Signed   By: Lorriane Shire M.D.   On: 01/22/2015 18:35    Assessment: 74 y.o.  1. Acute renal failure, likely secondary to poor by mouth intake, no obstructive uropathy on [place] 2. Stage IV colon cancer, on chemo (pembrolizumab)  3. Worsening left abdominal pain, secondary to metastatic disease 4. Oral thrush 5. Right foot ischemia, status post BKA. 6. Coagulopathy, secondary to Coumadin and AKI, no bleeding, resolved after vitK  7. Thrombocytosis, likely reactive 8. Large left pleural effusion 9. Anorexia and weight loss, deconditioning   Recommendations: -Giving the  renal failure, please consider holding her MS Contin for now, and use of morphine IR as needed for pain control -Advance her diet to soft diet -She has shortness breast with minimal exertion (getting out of bed), secondary to left pleural effusion and deconditioning. I would favor therapeutic left thoracentesis for symptom improvement.  -Giving him or her deconditioning, I discussed hospice with patient and her daughter again today. Patient was emotionally distressed, and broken into tears. Her daughter is more receptive to hospice. I'll follow-up on that tomorrow -We'll hold on port placement for now. -Please get social work involved for her SNF placement, possible hospice.   I will follow her. Please call me if you have questions.   Truitt Merle, MD 02/15/2015  1:46 PM

## 2015-02-15 NOTE — Progress Notes (Signed)
[facility] Progress Note   Kathryn Knapp HYI:[id number] DOB: 03-26-1941 DOA: 02/02/2015 PCP: No PCP Per Patient  Brief narrative: Kathryn Knapp is a 74 y.o. female with metastatic colon cancer, DM, HTN and depression who has had abdominal pain and decreased appetite. She is found to have ARF in the oncologist office and referred for admission.    Subjective: Continues to be nauseated and retching again after taking Resource Boost.    Assessment/Plan: Principal Problem:   Acute renal failure syndrome - suspect pre-renal due to inability to eat/drink and ATN component due to recent use of bactrim -Abdominal ultrasound to assess for obstructive uropathy ordered- no obstruction noted  Active Problems:  Abdominal pain with nausea and vomiting -Possibly secondary to metastatic colon cancer-    Colon cancer metastasized to multiple sites -Oncology has evaluated the patient- chemo not indicated due to poor performance status -Pembrolizumab given on 6/21- clearly not tolerating it- vomiting/ barely eating - will consult hospice- she needs to be DNR and comfort care at this point  History of peripheral vascular disease -On Coumadin-INR was supratherapeutic-Dr. fang asked for it to be reversed so she can receive a Port-A-Cath - given vitamin K 5 mg and recheck INR now 1.26-  Dr Burr Medico plans on holding off on Port at this point and I agree  Left pleural effusion -Chronic-she is asymptomatic and minimally mobile at baseline  --no need to tap at this time    Depression - cont prozac  Code Status: Full code DVT prophylaxis: Lovenox  Consultants: Oncology, palliative care  Antibiotics: Anti-infectives    None      Objective: Filed Weights   01/28/2015 1545  Weight: 50.1 kg (110 lb 7.2 oz)    Intake/Output Summary (Last 24 hours) at 02/15/15 0859 Last data filed at 02/15/15 0000  Gross per 24 hour  Intake 2264.17 ml  Output    725 ml  Net 1539.17 ml     Vitals Filed  Vitals:   02/14/15 0631 02/14/15 1431 02/14/15 2216 02/15/15 0614  BP: 104/51 107/59 119/52 108/51  Pulse: 68 76 76 88  Temp: 97.4 F (36.3 C) 97.8 F (36.6 C) 97.5 F (36.4 C) 97.5 F (36.4 C)  TempSrc: Oral Oral Oral Oral  Resp: 18 18 16 20   Height:      Weight:      SpO2: 95% 97% 96% 95%    Exam:  General:  Pt is alert, not in acute distress  HEENT: No icterus, No thrush, oral mucosa moist  Cardiovascular: regular rate and rhythm, S1/S2 No murmur  Respiratory: clear to auscultation bilaterally   Abdomen: Soft, +Bowel sounds, non tender, non distended, no guarding  MSK: No LE edema, cyanosis or clubbing  Data Reviewed: Basic Metabolic Panel:  Recent Labs Lab 01/27/2015 1243 02/14/15 0507 02/15/15 0350  NA 134* 134* 139  K 4.6 5.1 4.7  CL  --  103 110  CO2 20* 21* 20*  GLUCOSE 126 88 86  BUN 60.0* 65* 59*  CREATININE 3.5* 3.11* 2.47*  CALCIUM 10.0 8.7* 8.6*   Liver Function Tests:  Recent Labs Lab 01/28/2015 1243 02/14/15 0507  AST 25 16  ALT 10 8*  ALKPHOS 140 93  BILITOT 0.34 0.3  PROT 8.3 6.9  ALBUMIN 3.1* 2.9*   No results for input(s): LIPASE, AMYLASE in the last 168 hours. No results for input(s): AMMONIA in the last 168 hours. CBC:  Recent Labs Lab 02/07/2015 1243 02/14/15 0507 02/15/15 0350  WBC 9.1  7.4 6.6  NEUTROABS 6.9*  --   --   HGB 12.7 11.3* 10.7*  HCT 39.6 35.6* 35.5*  MCV 85.0 87.5 89.2  PLT 592* 444* 403*   Cardiac Enzymes: No results for input(s): CKTOTAL, CKMB, CKMBINDEX, TROPONINI in the last 168 hours. BNP (last 3 results) No results for input(s): BNP in the last 8760 hours.  ProBNP (last 3 results) No results for input(s): PROBNP in the last 8760 hours.  CBG: No results for input(s): GLUCAP in the last 168 hours.  No results found for this or any previous visit (from the past 240 hour(s)).   Studies: US Renal  02/14/2015   CLINICAL DATA:  Renal failure. History of diabetes and hypertension. Initial encounter.   EXAM: RENAL / URINARY TRACT ULTRASOUND COMPLETE  COMPARISON:  Abdominal CT 01/24/2015.  FINDINGS: Right Kidney:  Length: 11.7 cm. Echogenicity within normal limits. No mass or hydronephrosis visualized. Mildly prominent echogenicity superiorly in the sinus fat has no definite correlate on recent CT and is probably artifactual.  Left Kidney:  Length: 10.7 cm. Echogenicity within normal limits. No mass or hydronephrosis visualized.  Bladder:  Appears normal for degree of bladder distention.  IMPRESSION: Normal renal ultrasound.  No hydronephrosis.   Electronically Signed   By: Richardean Sale M.D.   On: 02/14/2015 21:27   Dg Chest Port 1 View  02/14/2015   CLINICAL DATA:  Shortness of breath and renal failure.  EXAM: PORTABLE CHEST - 1 VIEW  COMPARISON:  01/24/2015  FINDINGS: Increased densities in the left chest likely represent an enlarging left pleural effusion. The pleural effusion is moderate to large in size. Evidence for compressive atelectasis in the left lung. Heart size is difficult to evaluate due to the left chest densities. The right lung is clear.  IMPRESSION: Increased densities in the left chest are suggestive for an enlarging left pleural effusion with compressive atelectasis.   Electronically Signed   By: Markus Daft M.D.   On: 02/14/2015 10:47   Dg Abd Portable 1v  02/03/2015   CLINICAL DATA:  Lower abdominal and pelvic pain for 1 day. Nausea and vomiting. Constipation.  EXAM: PORTABLE ABDOMEN - 1 VIEW  COMPARISON:  CT scan dated 01/24/2015  FINDINGS: There are no dilated loops of bowel. The patient has had partial colectomy. The distal colon is not distended. Persistent large left pleural effusion. Scarring or atelectasis at the right base, unchanged.  Clips in the right upper quadrant from previous cholecystectomy.  No acute osseous abnormality.  IMPRESSION: No acute abnormalities of the abdomen. Persistent large left pleural effusion.   Electronically Signed   By: Lorriane Shire M.D.   On:  02/12/2015 18:35    Scheduled Meds:  Scheduled Meds: . docusate sodium  100 mg Oral Daily  . feeding supplement (RESOURCE BREEZE)  1 Container Oral TID BM  . FLUoxetine  20 mg Oral Daily  . gabapentin  100 mg Oral TID  . LORazepam  0.5-1 mg Oral 3 times per day  . morphine  30 mg Oral Q8H  . nystatin  5 mL Mouth/Throat QID  . pantoprazole (PROTONIX) IV  40 mg Intravenous Q24H  . sodium chloride  3 mL Intravenous Q12H   Continuous Infusions: . sodium chloride 125 mL/hr at 02/14/15 1659    Time spent on care of this patient:35 min   [place], MD 02/15/2015, 8:59 AM  LOS: 2 days   [facility] Office  [phone] Pager - Text Page per [email or web address] If 7PM-7AM, please contact  night-coverage [email or web address]

## 2015-02-15 NOTE — Care Management (Signed)
Important Message  Patient Details  Name: Kathryn Knapp MRN: [id number] Date of Birth: 11/17/1940   Medicare Important Message Given:  Yes-second notification given    Shelda Altes, LCSW 02/15/2015, 3:33 PM

## 2015-02-15 NOTE — Progress Notes (Signed)
BP rechecked after 500 ml bolus NS- BP 96/46- On call made aware. Okay to give routine ativan per on call. Patient a little restless but denied pain. Will cont to monitor.

## 2015-02-15 NOTE — Clinical Social Work Note (Signed)
Clinical Social Work Assessment  Patient Details  Name: Kathryn Knapp MRN: [id number] Date of Birth: 02-26-1941  Date of referral:  02/15/15               Reason for consult:  Discharge Planning                Permission sought to share information with:  Family Supports Permission granted to share information::  Yes, Verbal Permission Granted  Name::     Kathryn Knapp  Agency::     Relationship::  daughter  Contact Information:  [phone]  Housing/Transportation Living arrangements for the past 2 months:  [place] of Information:  Adult Children Patient Interpreter Needed:  None Criminal Activity/Legal Involvement Pertinent to Current Situation/Hospitalization:  No - Comment as needed Significant Relationships:  Adult Children Lives with:  Adult Children Do you feel safe going back to the place where you live?  No Need for family participation in patient care:  Yes (Comment)  Care giving concerns:  Pt admitted from home with pt daughter. Pt has had multiple recent hospitalizations and pt daughter whom pt does not live with is concerned that pt care cannot continue to be managed at home.    Social Worker assessment / plan:  CSW received consult for SNF vs hospice home. CSW spoke to RN who reports that pt has been lethargic the past 24 hours, but is beginning to be more awake and has expressed confusion about the information being provided by MD's. Pt RN stated that pt needed to be bladder scanned and provided CSW with pt daughter, [name] contact information to contact to complete assessment as pt had requested for pt daughter to be contacted.   CSW contacted pt daughter, [name] via telephone. CSW introduced self and explained role. CSW provided supportive listening as pt daughter discussed that pt lives with pt other daughter and pt daughter, [name] expressed that is appears that every time pt comes to an outpatient oncology appointment that she has to be admitted to  the hospital. CSW provided emotional support to pt daughter as she discussed that pt oncologist has had discussions with pt and pt family about rehab at [facility] and hospice home. CSW inquired with pt daughter if pt had expressed her feelings surrounding these options and pt daughter stated that she had not expressed her feelings, but pt daughter states that she plans to discuss with pt this evening in order to address pt goals. Pt daughter discussed that she is familiar with [facility] in [place] and that pt daughter had called in the past to inquire about the facility and was told that a referral would have to be made. CSW discussed with pt daughter that if pt elects for rehab at SNF then the SNF will expect for pt to be participating with therapy on a daily basis and pt daughter may want to ensure that pt wishes to do that. Pt daughter expressed understanding. CSW discussed with pt daughter that PT has been consulted and that evaluation may help pt and pt family determine if pt would be able to tolerate physical therapy at SNF level. Pt daughter discussed that she is also familiar with [facility] and pt family has had experience with [facility] in the past and were pleased with the facility. Pt daughter was agreeable to CSW sending information to [place] to have options open, but recognizes importance of having conversations with pt surrounding pt wishes following hospitalization.  CSW completed FL2 and initiated SNF search to [place]. CSW to follow up with facility once pt goals are further clarified.   CSW noted that MD placed palliative medicine consult for goals of care which CSW anticipates will be beneficial for pt and pt family to meet together to discuss pt diagnosis and prognosis.   CSW to follow to provide support and follow up with pt and pt family regarding disposition planning.   Employment status:  Retired Radiation protection practitioner:  Medicare PT Recommendations:  Not assessed at this time [place] / Referral to community resources:  [place]  Patient/Family's Response to care:  RN providing nursing care to pt at time of assessment. Pt daughter supportive and actively involved in pt care. Pt daughter plans to have further discussion with pt surrounding pt wishes moving forward.   Patient/Family's Understanding of and Emotional Response to Diagnosis, Current Treatment, and Prognosis:  Per RN, pt beginning to express confusion about the information being provided by the MD's and eager for pt daughter to be back present at bedside for support. Pt daughter displays a better understanding about pt decline, but would benefit from further education from MD and further discussion about prognosis in order to make an informed decision about disposition plan.   Emotional Assessment Appearance:  Appears older than stated age Attitude/Demeanor/Rapport:  Unable to Assess (pt being assisted by RN at this time) Affect (typically observed):  Unable to Assess (pt being assisted by RN today) Orientation:  Oriented to Self, Oriented to Place, Oriented to  Time, Oriented to Situation Alcohol / Substance use:  Not Applicable Psych involvement (Current and /or in the community):  No (Comment)  Discharge Needs  Concerns to be addressed:  Discharge Planning Concerns Readmission within the last 30 days:  Yes Current discharge risk:  Dependent with Mobility Barriers to Discharge:  Continued Medical Work up   Ladell Pier, LCSW 02/15/2015, 4:18 PM  [phone]

## 2015-02-15 NOTE — Progress Notes (Signed)
Follow up bladder scan from [facility]--250 cc noted in bladder. Pt aroused to state she does not have the desire to urinate.  Will report to oncoming nurse.  SRP, RN

## 2015-02-16 DIAGNOSIS — J811 Chronic pulmonary edema: Secondary | ICD-10-CM

## 2015-02-16 LAB — BASIC METABOLIC PANEL
Anion gap: 11 (ref 5–15)
BUN: 54 mg/dL — ABNORMAL HIGH (ref 6–20)
CALCIUM: 8.4 mg/dL — AB (ref 8.9–10.3)
CHLORIDE: 114 mmol/L — AB (ref 101–111)
CO2: 15 mmol/L — ABNORMAL LOW (ref 22–32)
CREATININE: 2.76 mg/dL — AB (ref 0.44–1.00)
GFR calc Af Amer: 19 mL/min — ABNORMAL LOW (ref >60)
GFR, EST NON AFRICAN AMERICAN: 16 mL/min — AB (ref >60)
Glucose, Bld: 81 mg/dL (ref 65–99)
POTASSIUM: 4.5 mmol/L (ref 3.5–5.1)
Sodium: 140 mmol/L (ref 135–145)

## 2015-02-16 LAB — PROTIME-INR
INR: 1.26 (ref 0.00–1.49)
Prothrombin Time: 15.9 seconds — ABNORMAL HIGH (ref 11.6–15.2)

## 2015-02-16 MED ORDER — VITAMINS A & D EX OINT
TOPICAL_OINTMENT | CUTANEOUS | Status: AC
  Administered 2015-02-16: 22:00:00
  Filled 2015-02-16: qty 5

## 2015-02-16 MED ORDER — FLUOXETINE HCL 20 MG PO CAPS
20.0000 mg | ORAL_CAPSULE | Freq: Every day | ORAL | Status: DC
  Administered 2015-02-16: 20 mg via ORAL
  Filled 2015-02-16: qty 1

## 2015-02-16 MED ORDER — LORAZEPAM 0.5 MG PO TABS: 0.5000 mg | ORAL_TABLET | Freq: Three times a day (TID) | ORAL | Status: DC | PRN

## 2015-02-16 NOTE — Progress Notes (Signed)
Kathryn Knapp   DOB:December 09, 1940   HD#:[id number]   QJJ#:[id number]  Subjective: The patient's daughter, she was incoherent last night, slightly better today. [facility] program has contacted them, and she is likely going their inpt facility   Objective:  Filed Vitals:   02/16/15 1348  BP: 98/44  Pulse: 80  Temp: 97.5 F (36.4 C)  Resp: 12    Body mass index is 15.41 kg/(m^2).  Intake/Output Summary (Last 24 hours) at 02/16/15 1659 Last data filed at 02/16/15 1200  Gross per 24 hour  Intake   3235 ml  Output    675 ml  Net   2560 ml     Sclerae icteric, cachectic  No peripheral adenopathy  Lungs clear -- no rales or rhonchi  Heart regular rate and rhythm  Abdomen (+) tenderness at left upper quadrant  MSK no focal spinal tenderness, no peripheral edema  Neuro: There is allergic, does not follow commands   CBG (last 3)  No results for input(s): GLUCAP in the last 72 hours.   Labs:  Lab Results  Component Value Date   WBC 6.6 02/15/2015   HGB 10.7* 02/15/2015   HCT 35.5* 02/15/2015   MCV 89.2 02/15/2015   PLT 403* 02/15/2015   NEUTROABS 6.9* 01/29/2015    @LASTCHEMISTRY @  Urine Studies No results for input(s): UHGB, CRYS in the last 72 hours.  Invalid input(s): UACOL, UAPR, USPG, UPH, UTP, UGL, UKET, UBIL, UNIT, UROB, [place], UEPI, UWBC, [place], [place], [place], [place], [place]  Basic Metabolic Panel:  Recent Labs Lab 02/11/2015 1243  02/14/15 0507 02/15/15 0350 02/16/15 0615  NA 134*  --  134* 139 140  K 4.6  < > 5.1 4.7 4.5  CL  --   --  103 110 114*  CO2 20*  --  21* 20* 15*  GLUCOSE 126  --  88 86 81  BUN 60.0*  --  65* 59* 54*  CREATININE 3.5*  --  3.11* 2.47* 2.76*  CALCIUM 10.0  --  8.7* 8.6* 8.4*  < > = values in this interval not displayed. GFR Estimated Creatinine Clearance: 14.4 mL/min (by C-G formula based on Cr of 2.76). Liver Function Tests:  Recent Labs Lab 01/22/2015 1243 02/14/15 0507  AST 25 16  ALT 10 8*  ALKPHOS 140 93  BILITOT 0.34  0.3  PROT 8.3 6.9  ALBUMIN 3.1* 2.9*   No results for input(s): LIPASE, AMYLASE in the last 168 hours. No results for input(s): AMMONIA in the last 168 hours. Coagulation profile  Recent Labs Lab 01/19/2015 1243 02/02/2015 1306 02/14/15 0507 02/15/15 0350 02/16/15 0615  INR Sent out for confirmation 6.74* 7.09* 1.26 1.26  PROTIME Sent out for confirmation.  --   --   --   --     CBC:  Recent Labs Lab 02/03/2015 1243 02/14/15 0507 02/15/15 0350  WBC 9.1 7.4 6.6  NEUTROABS 6.9*  --   --   HGB 12.7 11.3* 10.7*  HCT 39.6 35.6* 35.5*  MCV 85.0 87.5 89.2  PLT 592* 444* 403*   Cardiac Enzymes: No results for input(s): CKTOTAL, CKMB, CKMBINDEX, TROPONINI in the last 168 hours. BNP: Invalid input(s): POCBNP CBG: No results for input(s): GLUCAP in the last 168 hours. D-Dimer No results for input(s): DDIMER in the last 72 hours. Hgb A1c No results for input(s): HGBA1C in the last 72 hours. Lipid Profile No results for input(s): CHOL, HDL, LDLCALC, TRIG, CHOLHDL, LDLDIRECT in the last 72 hours. Thyroid function studies No  results for input(s): TSH, T4TOTAL, T3FREE, THYROIDAB in the last 72 hours.  Invalid input(s): FREET3 Anemia work up No results for input(s): VITAMINB12, FOLATE, FERRITIN, TIBC, IRON, RETICCTPCT in the last 72 hours. Microbiology No results found for this or any previous visit (from the past 240 hour(s)).    Studies:  US Renal  02/14/2015   CLINICAL DATA:  Renal failure. History of diabetes and hypertension. Initial encounter.  EXAM: RENAL / URINARY TRACT ULTRASOUND COMPLETE  COMPARISON:  Abdominal CT 01/24/2015.  FINDINGS: Right Kidney:  Length: 11.7 cm. Echogenicity within normal limits. No mass or hydronephrosis visualized. Mildly prominent echogenicity superiorly in the sinus fat has no definite correlate on recent CT and is probably artifactual.  Left Kidney:  Length: 10.7 cm. Echogenicity within normal limits. No mass or hydronephrosis visualized.   Bladder:  Appears normal for degree of bladder distention.  IMPRESSION: Normal renal ultrasound.  No hydronephrosis.   Electronically Signed   By: Richardean Sale M.D.   On: 02/14/2015 21:27    Assessment: 74 y.o.  1. Acute renal failure, likely secondary to poor by mouth intake, no obstructive uropathy on [place] 2. Stage IV colon cancer, on chemo (pembrolizumab)  3. Worsening left abdominal pain, secondary to metastatic disease 4. Oral thrush 5. Right foot ischemia, status post BKA. 6. Coagulopathy, secondary to Coumadin and AKI, no bleeding, resolved after vitK  7. Thrombocytosis, likely reactive 8. Large left pleural effusion 9. Anorexia and weight loss, deconditioning   Recommendations: -Given her recent deterioration, I recommend hospice. Patient is lethargic, her daughter agrees. -She is likely going to the [facility] be happy to continue as her MD when she is under hospice care   Truitt Merle, MD 02/16/2015  4:59 PM

## 2015-02-16 NOTE — Evaluation (Signed)
Physical Therapy Evaluation Patient Details Name: Kathryn Knapp MRN: [id number] DOB: May 27, 1941 Today's Date: 02/16/2015   History of Present Illness  74 yo female admitted with acute renal failure. Hx of met stage IV colon cancer, DM, HTN, R BKA  Clinical Impression  On eval, pt required Mod assist for bed mobility. Sat EOB ~ 8 minutes with Min guard to Min assist. Fatigued easily with LOB posteriorly multiple times. Daughter present but she did not (or could not) offer much PLOF/DME info to therapist when questioned this am. Recommend HHPT/24 hour care vs SNF depending on family decision and ability to continue to provide care.     Follow Up Recommendations Home health PT;Supervision/Assistance - 24 hour;SNF (depending on family decision and ability to continue to provide care)    Equipment Recommendations   (to be further determined)    Recommendations for Other Services       Precautions / Restrictions Precautions Precautions: Fall Restrictions Weight Bearing Restrictions: No RLE Weight Bearing: Non weight bearing      Mobility  Bed Mobility Overal bed mobility: Needs Assistance Bed Mobility: Rolling;Supine to Sit;Sit to Supine Rolling: Min assist   Supine to sit: Mod assist Sit to supine: Mod assist   General bed mobility comments: Assist for trunk and bil LEs. Increased time. Utilized bedpad to aid with positioning. Multimodal cues for safety, technique.   Transfers                 General transfer comment: Just prior to attempt to transfer, pt begain losing balance posteriorly repeatedly. Deferred transfer for safety reasons.   Ambulation/Gait                Stairs            Wheelchair Mobility    Modified Rankin (Stroke Patients Only)       Balance Overall balance assessment: Needs assistance Sitting-balance support: Bilateral upper extremity supported;Feet supported (L foot supported) Sitting balance-Leahy Scale: Poor Sitting  balance - Comments: Fair-Poor static sitting balance.Sat EOB ~8 minutes with varying level of assist: Min guard to Min assist. Towards end of time pt began losing balance repeatedly in posteior direction.  Postural control: Posterior lean                                   Pertinent Vitals/Pain Pain Assessment: No/denies pain    Home Living Family/patient expects to be discharged to:: Unsure Living Arrangements: Children Available Help at Discharge: Family             Additional Comments: daugher present but she wasn't providing much detailed info this am    Prior Function     Gait / Transfers Assistance Needed: daughter present during eval not providing much info about PLOF/DME           Hand Dominance        Extremity/Trunk Assessment   Upper Extremity Assessment: Generalized weakness           Lower Extremity Assessment: Generalized weakness;RLE deficits/detail RLE Deficits / Details: hip abd at least 2/5, hip flex at least 3/5 LLE Deficits / Details: At least 3/5  Cervical / Trunk Assessment: Kyphotic  Communication   Communication: Expressive difficulties  Cognition Arousal/Alertness: Lethargic Behavior During Therapy: Restless Overall Cognitive Status: Difficult to assess  General Comments      Exercises        Assessment/Plan    PT Assessment Patient needs continued PT services  PT Diagnosis Generalized weakness   PT Problem List Decreased strength;Decreased activity tolerance;Decreased balance;Decreased mobility;Decreased cognition;Decreased coordination  PT Treatment Interventions DME instruction;Functional mobility training;Therapeutic activities;Therapeutic exercise;Patient/family education;Balance training   PT Goals (Current goals can be found in the Care Plan section) Acute Rehab PT Goals Patient Stated Goal: none stated PT Goal Formulation: Patient unable to participate in goal setting Time  For Goal Achievement: 03/02/15 Potential to Achieve Goals: Fair    Frequency Min 3X/week   Barriers to discharge        Co-evaluation               End of Session   Activity Tolerance: Patient limited by fatigue (Limited by level of alertness) Patient left: in bed;with call bell/phone within reach;with bed alarm set;with family/visitor present           Time: 5391-2258 PT Time Calculation (min) (ACUTE ONLY): 23 min   Charges:   PT Evaluation $Initial PT Evaluation Tier I: 1 Procedure PT Treatments $Therapeutic Activity: 8-22 mins   PT G Codes:        Weston Anna, MPT Pager: [phone]

## 2015-02-16 NOTE — Progress Notes (Signed)
[facility] Progress Note   [name] MHD:[id number] DOB: 07/29/1941 DOA: 01/27/2015 PCP: No PCP Per Patient  Brief narrative: Kathryn Knapp is a 74 y.o. female with metastatic colon cancer, DM, HTN and depression who has had abdominal pain and decreased appetite. She is found to have ARF in the oncologist office and referred for admission.    Subjective: Was restless all night- received Ativan which helped. Currently not in pain but asking to go home. Daughter at bedside states she will not be going home. Waiting to speak with palliative doc.    Assessment/Plan: Principal Problem:   Acute renal failure syndrome - suspect pre-renal due to inability to eat/drink and ATN component due to recent use of bactrim -Abdominal ultrasound to assess for obstructive uropathy ordered- no obstruction noted  Active Problems:  Abdominal pain with nausea and vomiting -Possibly secondary to metastatic colon cancer-    Colon cancer metastasized to multiple sites -Oncology has evaluated the patient- chemo not indicated due to poor performance status -Pembrolizumab given on 6/21- clearly not tolerating it- vomiting/ barely eating, too weak to ambulate, cachectic  - have consulted hospice- she needs to be DNR and comfort care at this point  History of peripheral vascular disease -On Coumadin-INR was supratherapeutic-Dr. fang asked for it to be reversed so she can receive a Port-A-Cath - given vitamin K 5 mg and rechecked INR now 1.26-  Dr Burr Medico plans on holding off on Port at this point and I agree  Left pleural effusion -Chronic-she is asymptomatic, not hypoxic and minimally mobile at baseline  --no need to tap at this time    Depression - cont prozac  Code Status: Full code DVT prophylaxis: Lovenox  Consultants: Oncology, palliative care  Antibiotics: Anti-infectives    None      Objective: Filed Weights   01/22/2015 1545  Weight: 50.1 kg (110 lb 7.2 oz)    Intake/Output  Summary (Last 24 hours) at 02/16/15 1450 Last data filed at 02/16/15 1200  Gross per 24 hour  Intake   3235 ml  Output    675 ml  Net   2560 ml     Vitals Filed Vitals:   02/15/15 2040 02/15/15 2300 02/16/15 0606 02/16/15 1348  BP: 85/45 96/46 93/48  98/44  Pulse:  81 88 80  Temp:   97 F (36.1 C) 97.5 F (36.4 C)  TempSrc:   Axillary Axillary  Resp:   22 12  Height:      Weight:      SpO2:   100% 100%    Exam:  General:  Pt is alert, not in acute distress  HEENT: No icterus, No thrush, oral mucosa moist  Cardiovascular: regular rate and rhythm, S1/S2 No murmur  Respiratory: clear to auscultation bilaterally   Abdomen: Soft, +Bowel sounds, non tender, non distended, no guarding  MSK: No LE edema, cyanosis or clubbing  Data Reviewed: Basic Metabolic Panel:  Recent Labs Lab 01/20/2015 1243 02/14/15 0507 02/15/15 0350 02/16/15 0615  NA 134* 134* 139 140  K 4.6 5.1 4.7 4.5  CL  --  103 110 114*  CO2 20* 21* 20* 15*  GLUCOSE 126 88 86 81  BUN 60.0* 65* 59* 54*  CREATININE 3.5* 3.11* 2.47* 2.76*  CALCIUM 10.0 8.7* 8.6* 8.4*   Liver Function Tests:  Recent Labs Lab 02/06/2015 1243 02/14/15 0507  AST 25 16  ALT 10 8*  ALKPHOS 140 93  BILITOT 0.34 0.3  PROT 8.3 6.9  ALBUMIN 3.1*  2.9*   No results for input(s): LIPASE, AMYLASE in the last 168 hours. No results for input(s): AMMONIA in the last 168 hours. CBC:  Recent Labs Lab 02/15/2015 1243 02/14/15 0507 02/15/15 0350  WBC 9.1 7.4 6.6  NEUTROABS 6.9*  --   --   HGB 12.7 11.3* 10.7*  HCT 39.6 35.6* 35.5*  MCV 85.0 87.5 89.2  PLT 592* 444* 403*   Cardiac Enzymes: No results for input(s): CKTOTAL, CKMB, CKMBINDEX, TROPONINI in the last 168 hours. BNP (last 3 results) No results for input(s): BNP in the last 8760 hours.  ProBNP (last 3 results) No results for input(s): PROBNP in the last 8760 hours.  CBG: No results for input(s): GLUCAP in the last 168 hours.  No results found for this or any  previous visit (from the past 240 hour(s)).   Studies: US Renal  02/14/2015   CLINICAL DATA:  Renal failure. History of diabetes and hypertension. Initial encounter.  EXAM: RENAL / URINARY TRACT ULTRASOUND COMPLETE  COMPARISON:  Abdominal CT 01/24/2015.  FINDINGS: Right Kidney:  Length: 11.7 cm. Echogenicity within normal limits. No mass or hydronephrosis visualized. Mildly prominent echogenicity superiorly in the sinus fat has no definite correlate on recent CT and is probably artifactual.  Left Kidney:  Length: 10.7 cm. Echogenicity within normal limits. No mass or hydronephrosis visualized.  Bladder:  Appears normal for degree of bladder distention.  IMPRESSION: Normal renal ultrasound.  No hydronephrosis.   Electronically Signed   By: Richardean Sale M.D.   On: 02/14/2015 21:27    Scheduled Meds:  Scheduled Meds: . docusate sodium  100 mg Oral Daily  . feeding supplement (RESOURCE BREEZE)  1 Container Oral TID BM  . FLUoxetine  20 mg Oral QHS  . gabapentin  100 mg Oral TID  . nystatin  5 mL Mouth/Throat QID  . pantoprazole (PROTONIX) IV  40 mg Intravenous Q24H  . sodium chloride  3 mL Intravenous Q12H   Continuous Infusions: . sodium chloride 125 mL/hr at 02/16/15 1338    Time spent on care of this patient:35 min   Hay[place], MD 02/16/2015, 2:50 PM  LOS: 3 days   [facility] Office  [phone] Pager - Text Page per [email or web address] If 7PM-7AM, please contact night-coverage [email or web address]

## 2015-02-16 NOTE — Progress Notes (Signed)
Daily Progress Note   Patient Name: Kathryn Knapp       Date: 02/16/2015 DOB: Jun 07, 1941  Age: 74 y.o. MRN#: [id number] Attending Physician: Debbe Odea, MD Primary Care Physician: No PCP Per Patient Admit Date: 02/01/2015  Reason for Consultation/Follow-up: Establishing goals of care Life limiting illness: Metastatic colon cancer Subjective:  Patient is resting in bed. She appears to have gurgling respirations. She does not open eyes to voice command. Daughter Kathryn Knapp is now present at the bedside  Family meeting: Discussed with patient's daughter Kathryn Knapp at the bedside. Patient's other daughter Kathryn Knapp is not present. Patient lives with Kathryn Knapp in a trailer. Kathryn Knapp states that the state of the trailer is not conducive to discharge even with hospice assistance.   [facility] has been preliminarily consulted. Patient has been deemed an appropriate candidate for transfer to inpatient hospice towards the end of this hospitalization. Patient's daughter Kathryn Knapp leaning towards establishing CODE STATUS DO NOT RESUSCITATE. She states that she would like to discuss with the patient one more time prior to authorizing DO NOT RESUSCITATE status. Remains full code for now.  Patient's daughter Kathryn Knapp states that the patient's other daughter Kathryn Knapp is not coping well with the patient's decline. Kathryn Knapp wishes to establish healthcare agent paperwork in her name. Kathryn Knapp alleges that patient's other daughter Kathryn Knapp is an opioid use problem and is possibly involved in drug diversion.  Voiced understanding and offered supportive care and active listening to daughter Kathryn Knapp. Currently, patient unable to be awake alert enough to participate in decision-making. Discussed with Kathryn Knapp that DO NOT RESUSCITATE, hospice involvement is most medically appropriate in this circumstance.  Palliative will continue to follow along.  Length of Stay: 3 days  Current Medications: Scheduled Meds:  . docusate sodium  100 mg  Oral Daily  . feeding supplement (RESOURCE BREEZE)  1 Container Oral TID BM  . FLUoxetine  20 mg Oral QHS  . gabapentin  100 mg Oral TID  . nystatin  5 mL Mouth/Throat QID  . pantoprazole (PROTONIX) IV  40 mg Intravenous Q24H  . sodium chloride  3 mL Intravenous Q12H    Continuous Infusions: . sodium chloride 125 mL/hr at 02/16/15 1338    PRN Meds: acetaminophen **OR** acetaminophen, bisacodyl, LORazepam, methocarbamol, morphine, morphine injection, ondansetron **OR** ondansetron (ZOFRAN) IV, prochlorperazine, sodium chloride, traMADol  Palliative Performance Scale: 20 %     Vital Signs: BP 98/44 mmHg  Pulse 80  Temp(Src) 97.5 F (36.4 C) (Axillary)  Resp 12  Ht 5\' 11"  (1.803 m)  Wt 50.1 kg (110 lb 7.2 oz)  BMI 15.41 kg/m2  SpO2 100% SpO2: SpO2: 100 % O2 Device: O2 Device: Nasal Cannula O2 Flow Rate: O2 Flow Rate (L/min): 2 L/min  Intake/output summary:  Intake/Output Summary (Last 24 hours) at 02/16/15 1635 Last data filed at 02/16/15 1200  Gross per 24 hour  Intake   3235 ml  Output    675 ml  Net   2560 ml   LBM:   Baseline Weight: Weight: 50.1 kg (110 lb 7.2 oz) Most recent weight: Weight: 50.1 kg (110 lb 7.2 oz)  Physical Exam: Weak cachectic elderly lady resting in bed Coarse breath sounds anteriorly Wasting evident No edema Does not open eyes.            Additional Data Reviewed: Recent Labs     02/14/15  0507  02/15/15  0350  02/16/15  0615  WBC  7.4  6.6   --   HGB  11.3*  10.7*   --   PLT  444*  403*   --   NA  134*  139  140  BUN  65*  59*  54*  CREATININE  3.11*  2.47*  2.76*     Problem List:  Patient Active Problem List   Diagnosis Date Noted  . Renal failure   . Encounter for palliative care   . ARF (acute renal failure) 01/23/2015  . Protein-calorie malnutrition, severe 01/25/2015  . Cough 01/24/2015  . Diabetes mellitus without complication 83/25/4982  . UTI (lower urinary tract infection) 01/24/2015  . GERD  (gastroesophageal reflux disease) 01/24/2015  . Severe muscle deconditioning 01/11/2015  . Amputation of right lower extremity below knee 01/01/2015  . S/P exploratory laparotomy   . S/P unilateral BKA (below knee amputation)   . Abdominal pain   . Pain in joint, lower leg   . Palliative care encounter   . Acute respiratory failure with hypoxia   . Thrombus   . Acute renal failure syndrome   . Colon cancer metastasized to multiple sites   . Acute blood loss anemia   . Melena   . Depression   . Noncompliance with medication regimen 12/23/2014  . S/P partial colectomy 12/17/2014  . Bowel obstruction 12/16/2014  . Colonic mass 12/16/2014     Palliative Care Assessment & Plan    Code Status:  Full code  Goals of Care:  Discussed in detail with the patient's daughter Kathryn Knapp as mentioned above. Prognosis appears to be days to some very limited number of weeks at this point.  Desire for further Chaplaincy support:no  3. Symptom Management:  Continue current when necessary's. We'll continue to monitor.  4. Palliative Prophylaxis:  Stool Softener: Yes.  5. Prognosis: Days to some very limited number of weeks.  5. Discharge Planning: Likely transfer to hosp[facility] of [facility] after DO NOT RESUSCITATE is established, after patient's daughters have attempted to discuss with her one last time.   Care plan was discussed with Kathryn Knapp, case management  Thank you for allowing the Palliative Medicine Team to assist in the care of this patient.   Time In: 1500 Time Out: 1535 Total Time 35 Prolonged Time Billed  no     Greater than 50%  of this time was spent counseling and coordinating care related to the above assessment and plan.   Loistine Chance, MD  02/16/2015, 4:35 PM  23 6-3 1 8-7 167 Please contact Palliative Medicine Team phone at [phone] for questions and concerns.

## 2015-02-16 NOTE — Plan of Care (Signed)
Problem: Phase I Progression Outcomes Goal: Voiding-avoid urinary catheter unless indicated Outcome: Not Met (add Reason) Foley inserted 02/15/15 d/t acute urinary retention. I&O cath done x 3 d/t inablity to void prior to foley insertion.

## 2015-02-16 NOTE — Progress Notes (Signed)
CSW continuing to follow.   CSW followed up with pt daughter, [name] at bedside.   CSW provided support as pt daughter emotional. Pt daughter stated that she feels overwhelmed and concerned about pt. Pt daughter expressed that she is processing information from meeting with Dr. Rowe Pavy with palliative care team. Pt daughter states that she is unsure if pt could be managed at home with hospice, but also unsure if pt could actively participate in rehab at [facility]. Pt daughter inquired about hospice home of [facility] and CSW discussed that CSW could initiate referrals, but CSW unsure if pt eligible for hospice home and PMT MD and/or oncologist could clarify prognosis. Pt daughter states that she is interested in referral being made to [facility] (first choice) and [facility] (second choice). CSW provided SNF bed offers for pt daughter to have to consider.   CSW contacted [facility] and discussed with facility and sent pt clinical information. CSW plans to send updated palliative medicine team note to further clarify pt prognosis and decision about transitioning to full comfort care. CSW made referral to [facility], Erling Conte and will contact [facility] liaison, Erling Conte with further documentation is provided about final decision for plan of care.   CSW to follow up following PMT MD visit today and send updated information to Hospice facilities to determine if pt is appropriate for residential hospice. Pt daughter aware that decision will have to be made between Home with Hospice and SNF for rehab if pt is not residential hospice appropriate.  Alison Murray, MSW, [place] Work [phone]

## 2015-02-16 DEATH — deceased

## 2015-02-17 DIAGNOSIS — J948 Other specified pleural conditions: Secondary | ICD-10-CM

## 2015-02-17 DIAGNOSIS — F411 Generalized anxiety disorder: Secondary | ICD-10-CM

## 2015-02-17 DIAGNOSIS — R0600 Dyspnea, unspecified: Secondary | ICD-10-CM | POA: Insufficient documentation

## 2015-02-17 LAB — PROTIME-INR
INR: 1.31 (ref 0.00–1.49)
Prothrombin Time: 16.4 seconds — ABNORMAL HIGH (ref 11.6–15.2)

## 2015-02-17 MED ORDER — MIDAZOLAM HCL 2 MG/2ML IJ SOLN
2.0000 mg | INTRAMUSCULAR | Status: DC | PRN
  Administered 2015-02-17: 2 mg via INTRAVENOUS
  Filled 2015-02-17 (×2): qty 2

## 2015-02-17 MED ORDER — SODIUM CHLORIDE 0.9 % IV SOLN: INTRAVENOUS | Status: DC

## 2015-02-17 MED ORDER — MORPHINE SULFATE 2 MG/ML IJ SOLN: 2.0000 mg | INTRAMUSCULAR | Status: DC | PRN

## 2015-02-17 MED ORDER — FUROSEMIDE 10 MG/ML IJ SOLN
INTRAMUSCULAR | Status: AC
  Filled 2015-02-17: qty 4

## 2015-02-17 MED ORDER — MORPHINE SULFATE 2 MG/ML IJ SOLN
2.0000 mg | INTRAMUSCULAR | Status: DC | PRN
  Administered 2015-02-17 (×2): 2 mg via INTRAVENOUS
  Filled 2015-02-17 (×2): qty 1

## 2015-02-17 MED ORDER — FUROSEMIDE 10 MG/ML IJ SOLN: 40.0000 mg | Freq: Once | INTRAMUSCULAR | Status: DC

## 2015-02-17 MED ORDER — ATROPINE SULFATE 1 % OP SOLN
4.0000 [drp] | OPHTHALMIC | Status: DC | PRN
  Filled 2015-02-17: qty 2

## 2015-02-17 MED ORDER — BISACODYL 10 MG RE SUPP: 10.0000 mg | Freq: Every day | RECTAL | Status: DC | PRN

## 2015-02-17 MED ORDER — POLYVINYL ALCOHOL 1.4 % OP SOLN
1.0000 [drp] | Freq: Four times a day (QID) | OPHTHALMIC | Status: DC | PRN
  Filled 2015-02-17: qty 15

## 2015-02-20 ENCOUNTER — Telehealth: Payer: Self-pay | Admitting: *Deleted

## 2015-02-20 NOTE — Telephone Encounter (Signed)
Noted patient had died over weekend. Called [name] to express our condolences and let them know it was a pleasure to care for Kathryn Knapp and her family. Zakiyyah was a very sweet, kind lady. Appreciated call and said they are doing OK-arrangements have been made.

## 2015-02-23 ENCOUNTER — Other Ambulatory Visit: Payer: Medicare Other

## 2015-02-23 ENCOUNTER — Ambulatory Visit: Payer: Medicare Other

## 2015-02-27 ENCOUNTER — Inpatient Hospital Stay: Payer: Medicare Other | Admitting: Physical Medicine & Rehabilitation

## 2015-03-02 ENCOUNTER — Inpatient Hospital Stay: Payer: Medicare Other | Admitting: Physical Medicine & Rehabilitation

## 2015-03-02 ENCOUNTER — Ambulatory Visit: Payer: Medicare Other

## 2015-03-09 ENCOUNTER — Ambulatory Visit: Payer: Medicare Other

## 2015-03-09 ENCOUNTER — Other Ambulatory Visit: Payer: Medicare Other

## 2015-03-19 NOTE — Discharge Summary (Addendum)
  Death Summary  Kathryn Knapp YVO:[id number] DOB: June 04, 1941 DOA: Feb 25, 2015  PCP: Dr Burr Medico  Admit date: 2015-02-25 Date of Death: 03-01-2015  Final Diagnoses:  Principal Problem:   Acute renal failure syndrome Active Problems:   Bowel obstruction   Thrombus   Colon cancer metastasized to multiple sites   Depression   Abdominal pain   Diabetes mellitus without complication   ARF (acute renal failure)   Renal failure   Encounter for palliative care   Pulmonary congestion and hypostasis   Dyspnea   Anxiety state    [name] expired on 1014 today. She was transitioned to DNR and comfort care just before she passed.  Below is my last note.    Hospital Course:  Principal Problem:  Acute renal failure syndrome - suspect pre-renal due to inability to eat/drink and ATN component due to recent use of bactrim -Abdominal ultrasound to assess for obstructive uropathy ordered- no obstruction noted  Active Problems:  Abdominal pain with nausea and vomiting -Possibly secondary to metastatic colon cancer-   Colon cancer metastasized to multiple sites -Oncology has evaluated the patient- chemo not indicated due to poor performance status -Pembrolizumab given on 6/21- clearly not tolerating it- vomiting/ barely eating, too weak to ambulate, cachectic  - have consulted hospice- she needs to be DNR and comfort care at this point  History of peripheral vascular disease -On Coumadin-INR was supratherapeutic-Dr. fang asked for it to be reversed so she can receive a Port-A-Cath - given vitamin K 5 mg and rechecked INR now 1.26- Dr Burr Medico plans on holding off on Port at this point and I agree  Left pleural effusion -Chronic-she is asymptomatic, not hypoxic and minimally mobile at baseline  --no need to tap at this time   Depression - cont prozac    Time:35 min  Signed:  [place]  [facility] 03-01-15, 2:23 PM

## 2015-03-19 NOTE — Progress Notes (Signed)
Patient noted to have crackles all over lung fields. O2 sat 95 % at 2L/min, RR 24. HOB elevated, coughs productively  At times, no signs of distress noted. Still on NS at 153ml/hr. Last CXR showed left pleural effusion. On call NP T . Rogue Bussing made aware. New order received: Decrease NS to 75 ml/hr. Order carried out. Placed suction set up in room. Will monitor closely.

## 2015-03-19 NOTE — Clinical Social Work Note (Signed)
CSW reviewed hand off that reflected pt was to be transferred to in patient hospice today.  CSW reviewed pt chart that reflected pt was in the dying process.  CSW called and spoke with [facility] to advise that pt was not stable for transport and was in the dying process  Pt passed away at 10:00 and CSW was asked by RN to meet with family regarding options for cremation services.  CSW provided reflective and supportive listening to pt's two daughters who had just said good bye to their mother.    CSW provided information regarding cremation options and encouraged family to explore thoughts and feelings related to pt's death, life and wishes for end of life.    CSW prompted family to take time to speak to their family members and church to help with the financial costs of the cremation.    Pt's daughters stated that they would have to make these calls at home and that their church was willing to assist in some ways.  CSW provided phone number and encouraged family to call over the weekend with any questions, concerns or thoughts related to pt's death and end of life care.  Dede Query, LCSW [place] Worker - Weekend Coverage cell #: [phone]

## 2015-03-19 NOTE — Progress Notes (Signed)
Daily Progress Note   Patient Name: Kathryn Knapp       Date: 26-Feb-2015 DOB: 03-11-1941  Age: 74 y.o. MRN#: [id number] Attending Physician: Debbe Odea, MD Primary Care Physician: No PCP Per Patient Admit Date: 01/28/2015  Reason for Consultation/Follow-up: Establishing goals of care Life limiting illness: Metastatic colon cancer Subjective:  Patient is in respiratory distress, eyes open, does not verbalize. She has increased dyspnea, tachypnea and increased congestion.   Family meeting: Discussed with patient's daughter Kathryn Knapp at the bedside. Also discussed with RN and Dr Wynelle Cleveland DNR DNI Comfort measures only Morphine IV PRN. Consider infusion to control symptoms Versed IV PRN Prognosis hours discussed with patient's daughter Kathryn Knapp who is tearful.  Continue comfort measures It does not appear that the patient will survive this hospitalization to get to inpatient hospice.   Length of Stay: 4 days  Current Medications: Scheduled Meds:  . furosemide      . pantoprazole (PROTONIX) IV  40 mg Intravenous Q24H  . sodium chloride  3 mL Intravenous Q12H    Continuous Infusions:    PRN Meds: [DISCONTINUED] acetaminophen **OR** acetaminophen, atropine, bisacodyl, midazolam, morphine injection, ondansetron **OR** ondansetron (ZOFRAN) IV, polyvinyl alcohol, prochlorperazine, sodium chloride  Palliative Performance Scale: 10 %     Vital Signs: BP 121/54 mmHg  Pulse 108  Temp(Src) 97.8 F (36.6 C) (Oral)  Resp 16  Ht 5\' 11"  (1.803 m)  Wt 50.1 kg (110 lb 7.2 oz)  BMI 15.41 kg/m2  SpO2 95% SpO2: SpO2: 95 % O2 Device: O2 Device: Nasal Cannula O2 Flow Rate: O2 Flow Rate (L/min): 2 L/min  Intake/output summary:   Intake/Output Summary (Last 24 hours) at 02/26/15 0827 Last data filed at 2015-02-26 0600  Gross per 24 hour  Intake 3049.99 ml  Output    725 ml  Net 2324.99 ml   LBM:   Baseline Weight: Weight: 50.1 kg (110 lb 7.2 oz) Most recent weight: Weight: 50.1 kg (110  lb 7.2 oz)  Physical Exam: Weak cachectic elderly possibly actively dying Coarse breath sounds anteriorly, crackles Wasting evident No edema, RLE amputation.  Eyes open, glazed look, restless. Does not verbalize.   Additional Data Reviewed: Recent Labs     02/15/15  0350  02/16/15  0615  WBC  6.6   --   HGB  10.7*   --   PLT  403*   --   NA  139  140  BUN  59*  54*  CREATININE  2.47*  2.76*     Problem List:  Patient Active Problem List   Diagnosis Date Noted  . Pulmonary congestion and hypostasis   . Renal failure   . Encounter for palliative care   . ARF (acute renal failure) 02/12/2015  . Protein-calorie malnutrition, severe 01/25/2015  . Cough 01/24/2015  . Diabetes mellitus without complication 92/42/6834  . UTI (lower urinary tract infection) 01/24/2015  . GERD (gastroesophageal reflux disease) 01/24/2015  . Severe muscle deconditioning 01/11/2015  . Amputation of right lower extremity below knee 01/01/2015  . S/P exploratory laparotomy   . S/P unilateral BKA (below knee amputation)   . Abdominal pain   . Pain in joint, lower leg   . Palliative care encounter   . Acute respiratory failure with hypoxia   . Thrombus   . Acute renal failure syndrome   . Colon cancer metastasized to multiple sites   . Acute blood loss anemia   . Melena   . Depression   . Noncompliance with  medication regimen 12/23/2014  . S/P partial colectomy 12/17/2014  . Bowel obstruction 12/16/2014  . Colonic mass 12/16/2014     Palliative Care Assessment & Plan    Code Status:  DNR DNI comfort care only  Goals of Care:   comfort measures only. Patient to be on Morphine IV PRN and Versed IV PRN. Consider Morphine infusion.   Desire for further Chaplaincy support: chaplain consult ordered as part of comfort care order set.   3. Symptom Management:  Dyspnea, air hunger: Morphine and Versed.   4. Palliative Prophylaxis:  Stool Softener: Yes.  5. Prognosis: minutes to  hours.   5. Discharge Planning:  Will likely expire in the hospital.   Care plan was discussed with Kathryn Knapp, case management  Thank you for allowing the Palliative Medicine Team to assist in the care of this patient.   Time In: 0755 Time Out: 0830 Total Time 35 Prolonged Time Billed  no     Greater than 50%  of this time was spent counseling and coordinating care related to the above assessment and plan.   Loistine Chance, MD  03-03-15, 8:27 AM  23 6-3 1 8-7 167 Please contact Palliative Medicine Team phone at [phone] for questions and concerns.

## 2015-03-19 NOTE — Progress Notes (Signed)
Pt passed away at 1014.  2 RN's listened and verified.  MD made aware.  Comfort and support given to family.  [place] donor services called.

## 2015-03-19 NOTE — Progress Notes (Signed)
This morning pt was is in respiratory distress with increased dyspnea, tachypnea and increased congestion. MD made aware. Pt changed to DNR status and comfort measures put in place.  Versed and morphine given to decrease work of breathing.  Explained and educated family on the purpose of the medications.  Will continue to monitor pt closely and give medications as needed.

## 2015-03-19 DEATH — deceased

## 2015-03-27 ENCOUNTER — Other Ambulatory Visit: Payer: Self-pay | Admitting: Hematology

## 2016-03-09 IMAGING — CR DG CHEST 1V PORT
1 series · 1 of 1 positions shown · non-contrast
Comparison: 12/19/2014

CLINICAL DATA: Respiratory failure

EXAM:
PORTABLE CHEST - 1 VIEW

[AP]
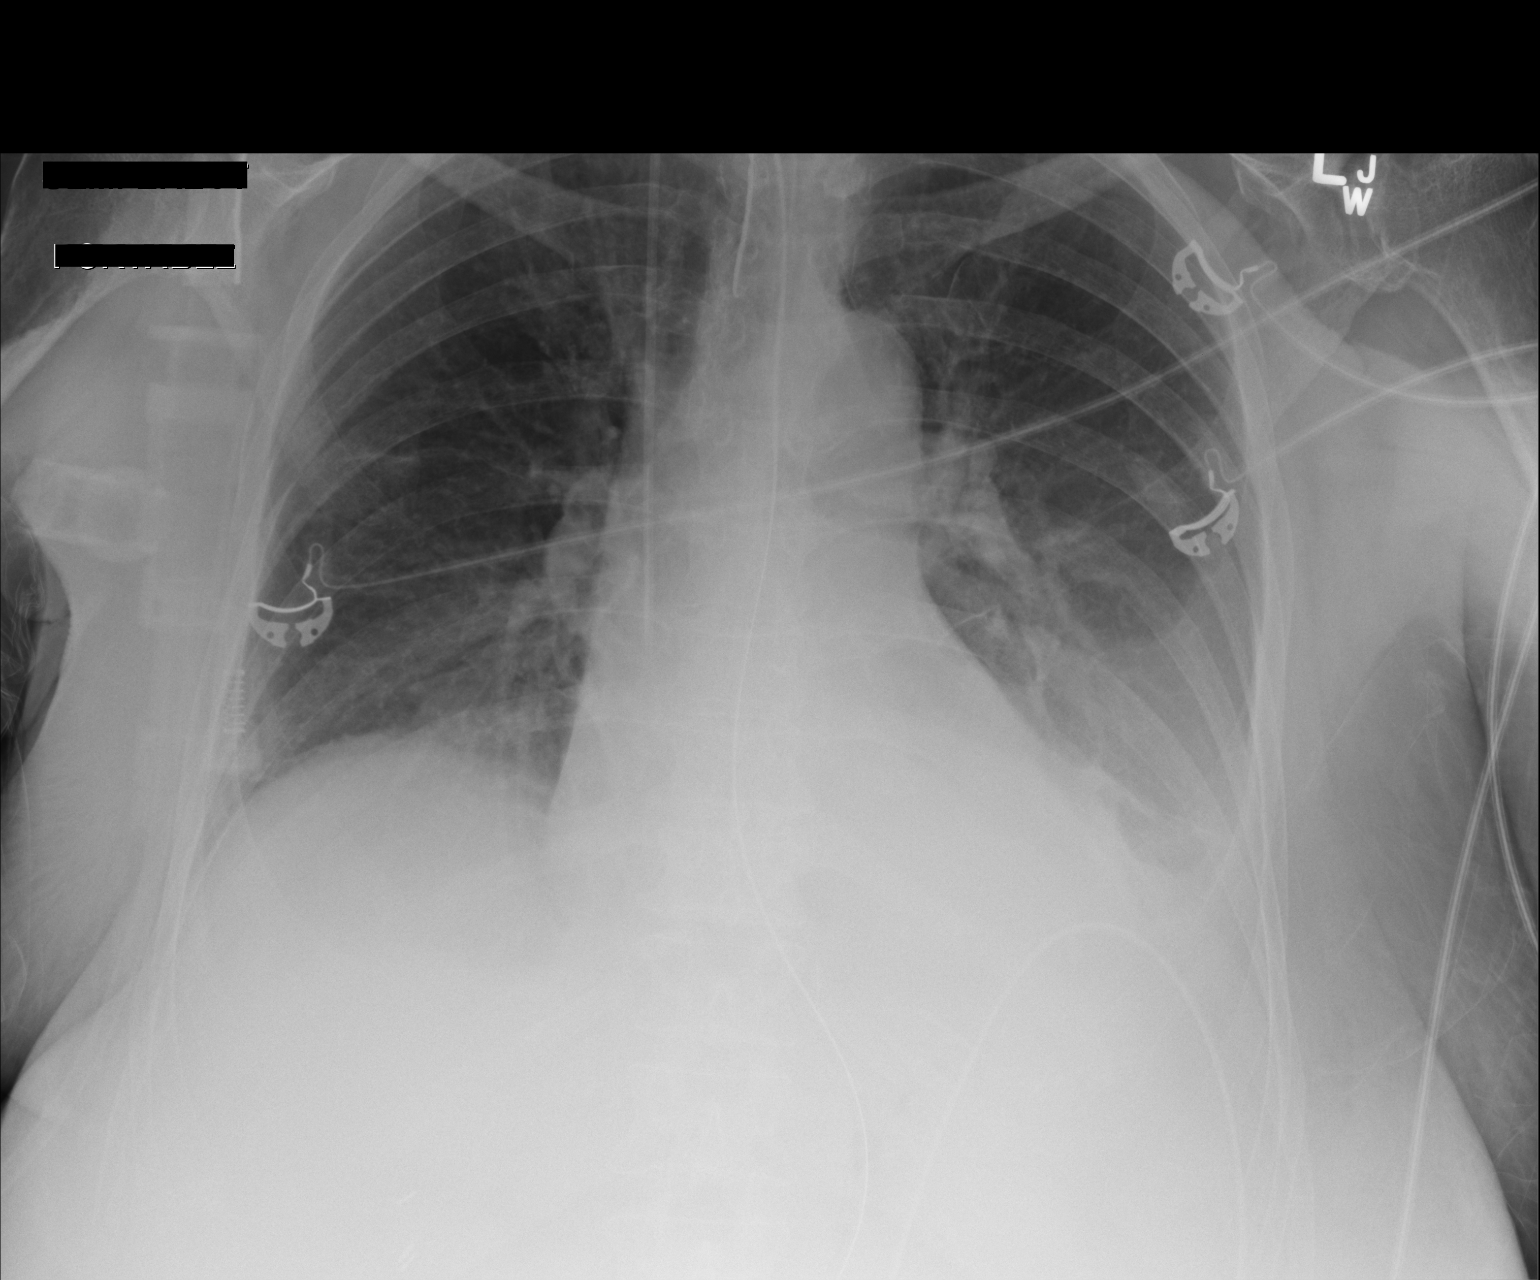

[1 of 1 positions shown; findings below may reference images not displayed]

FINDINGS: Endotracheal tube is 4.4 cm above the carina. The nasogastric tube
extends into the stomach. The right jugular central line extends to
the cavoatrial junction. Consolidation and effusion persist in the
left base without significant interval change. The right lung
remains clear except for minimal linear scarring or atelectasis.
IMPRESSION: Support equipment appears satisfactorily positioned.

Unchanged left base consolidation and effusion.

## 2016-03-21 IMAGING — CR DG CHEST 2V
2 series · 2 of 2 positions shown · non-contrast
Comparison: 12/21/2014

CLINICAL DATA: Shortness of Breath

EXAM:
CHEST  2 VIEW

[chest lat]
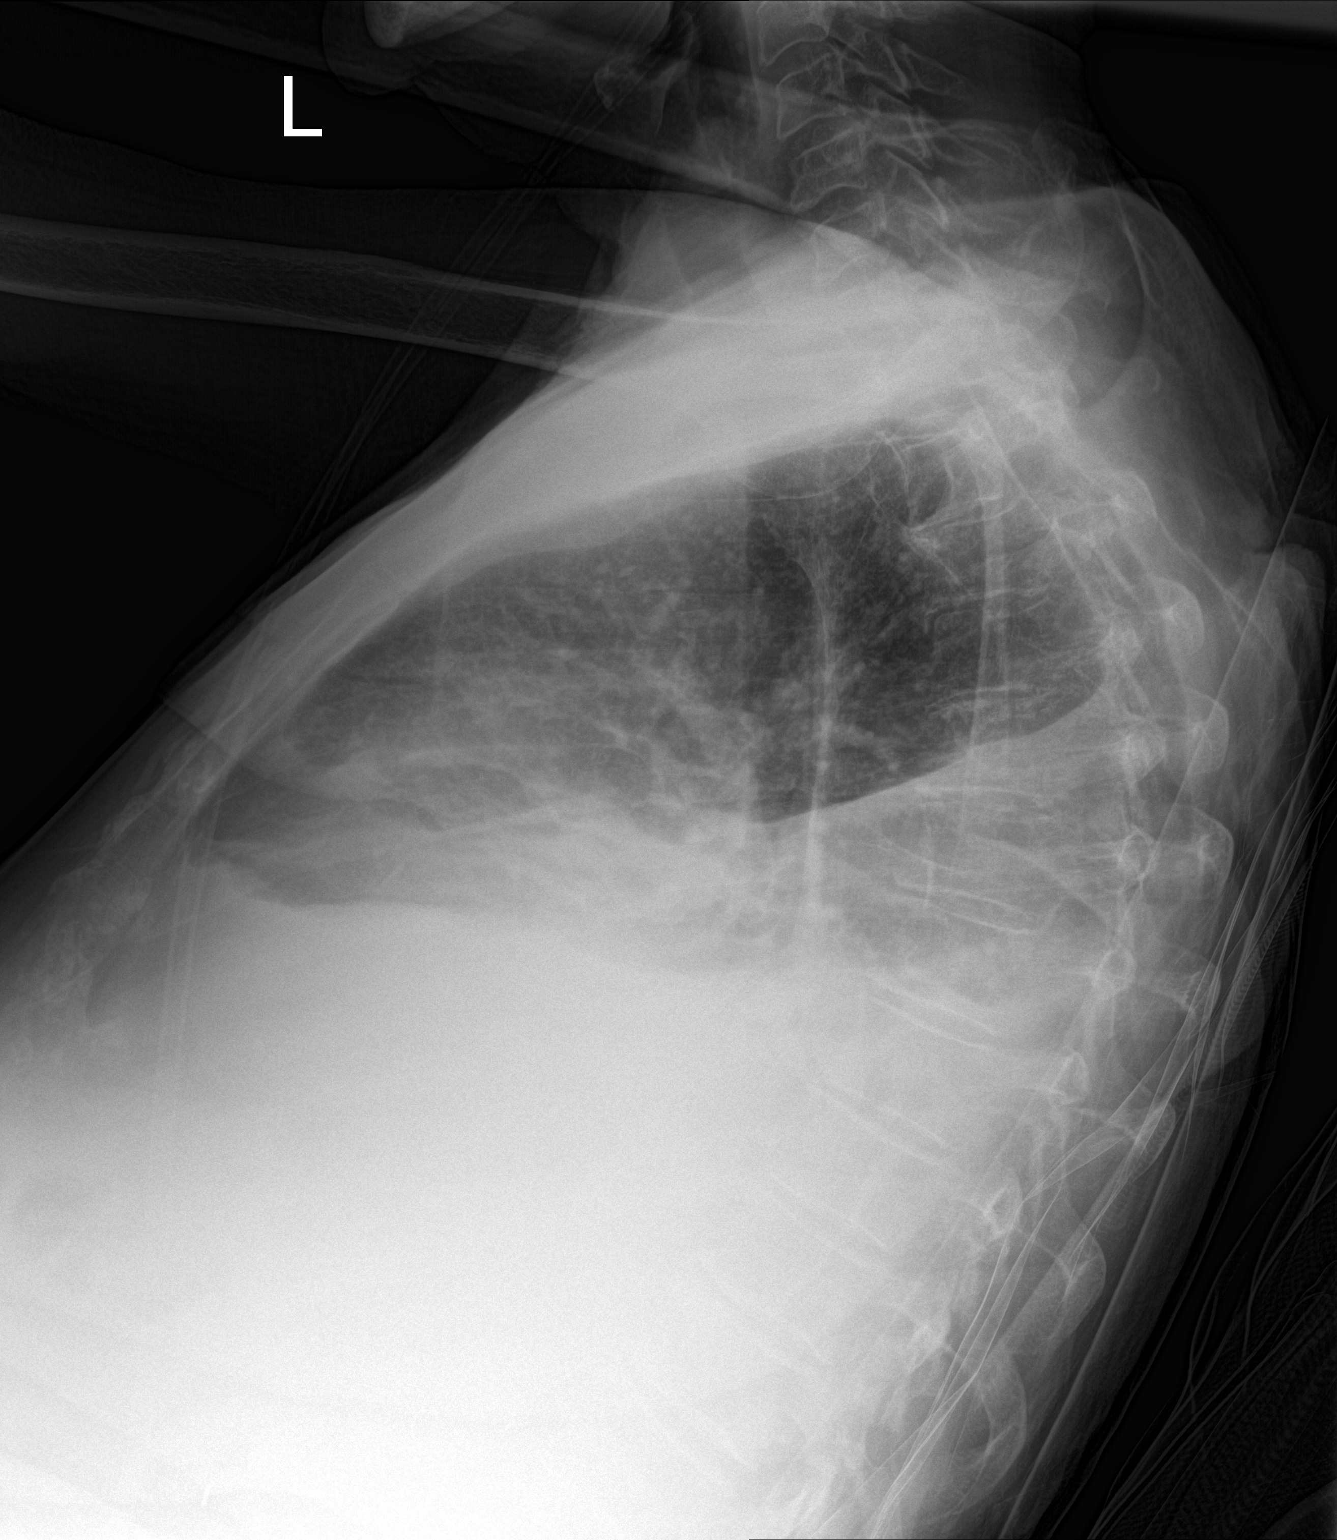

[chest ap]
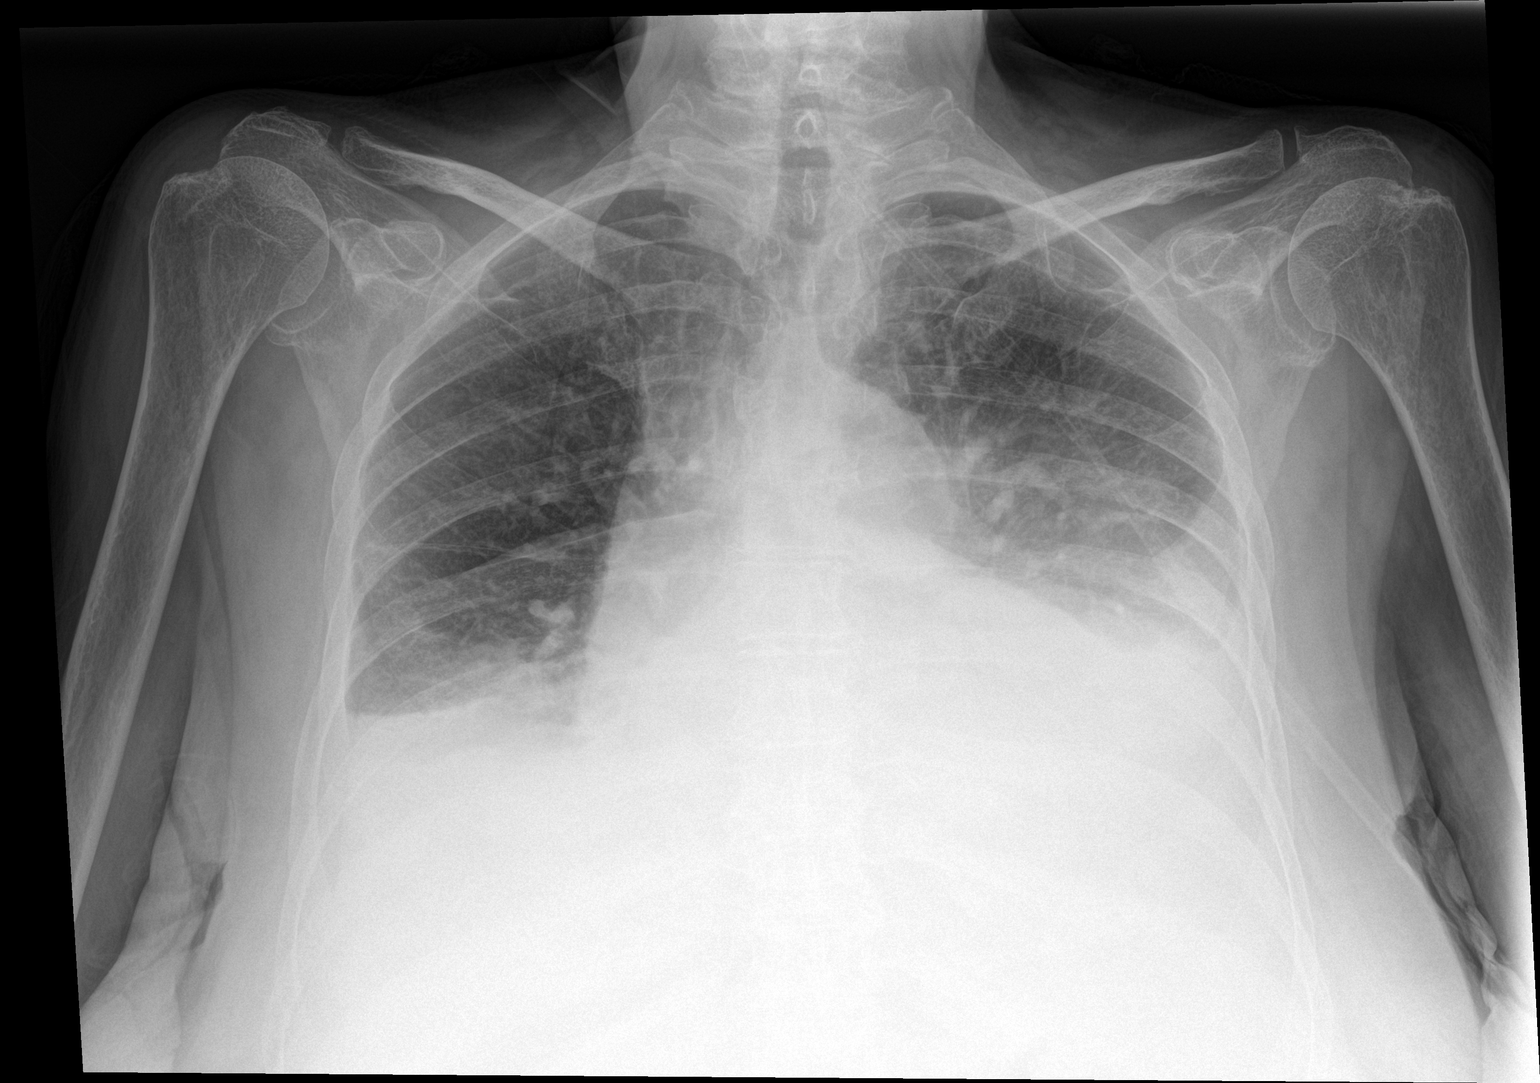

[2 of 2 positions shown; findings below may reference images not displayed]

FINDINGS: Cardiac shadow is enlarged. Bilateral pleural effusions are noted
which have increased in the interval from the prior exam. The left
effusion is larger than the right. Mild vascular congestion is seen.
No acute bony abnormality is noted.
IMPRESSION: Increasing bilateral pleural effusions left greater than right.

## 2016-04-24 IMAGING — US US RENAL
1 series · 14 of 25 positions shown · non-contrast
Comparison: Abdominal CT 01/24/2015.

CLINICAL DATA: Renal failure. History of diabetes and hypertension.
Initial encounter.

EXAM:
RENAL / URINARY TRACT ULTRASOUND COMPLETE

[Series 1: us renal · 0.20mm/px · 14 of 36 slices shown]
[im 1/36]
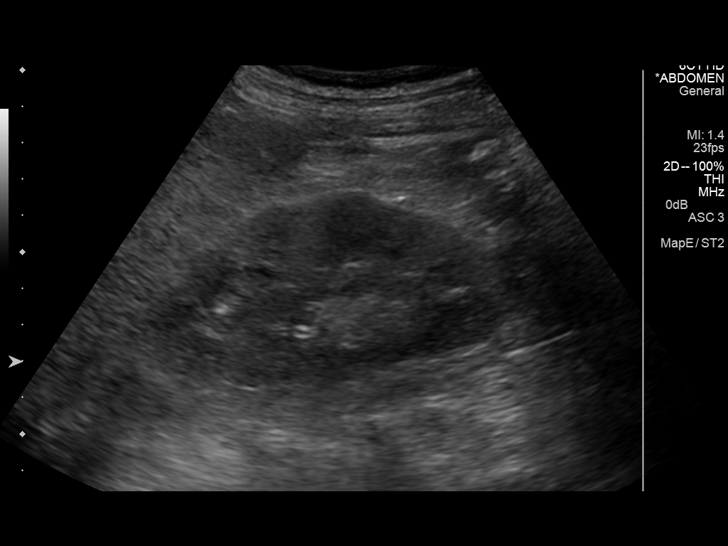
[im 3/36]
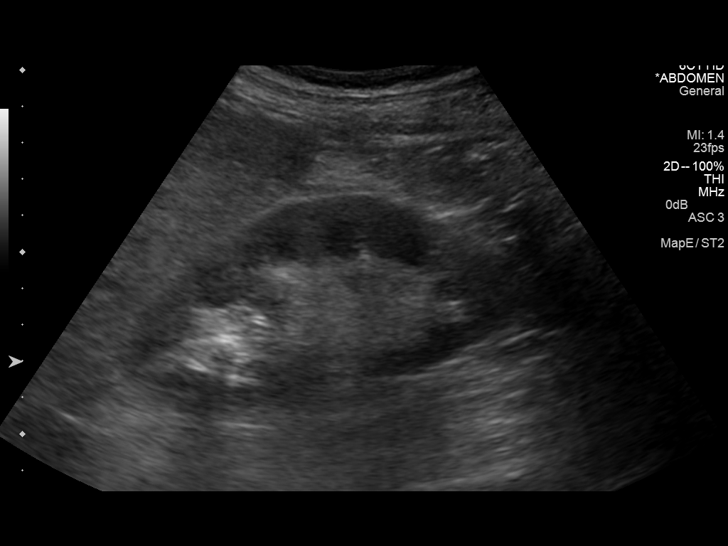
[im 6/36]
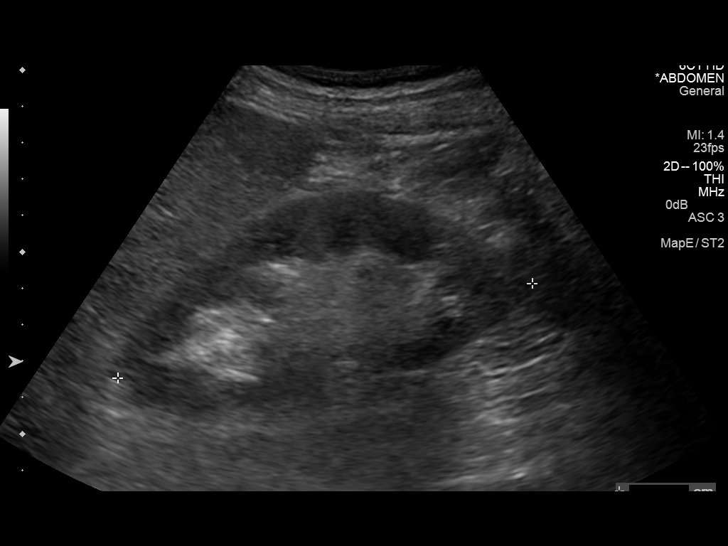
[im 9/36]
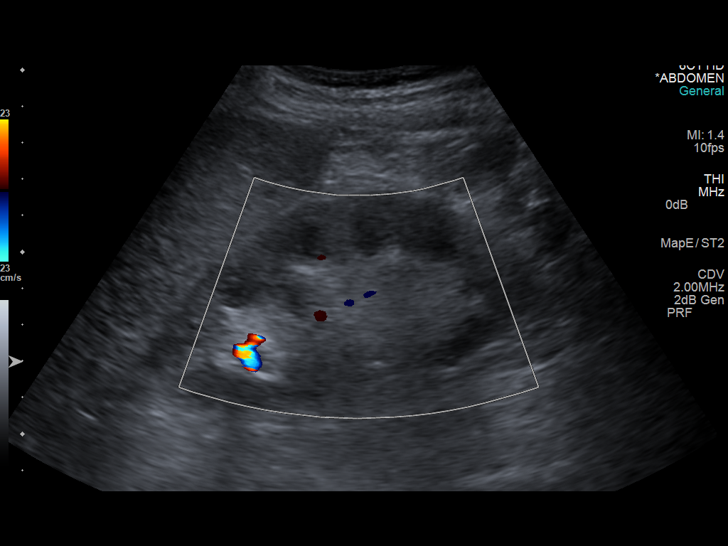
[im 12/36]
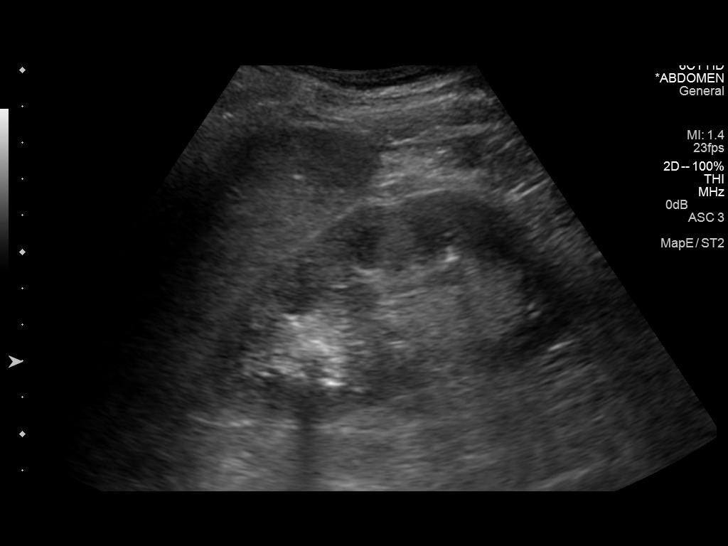
[im 14/36]
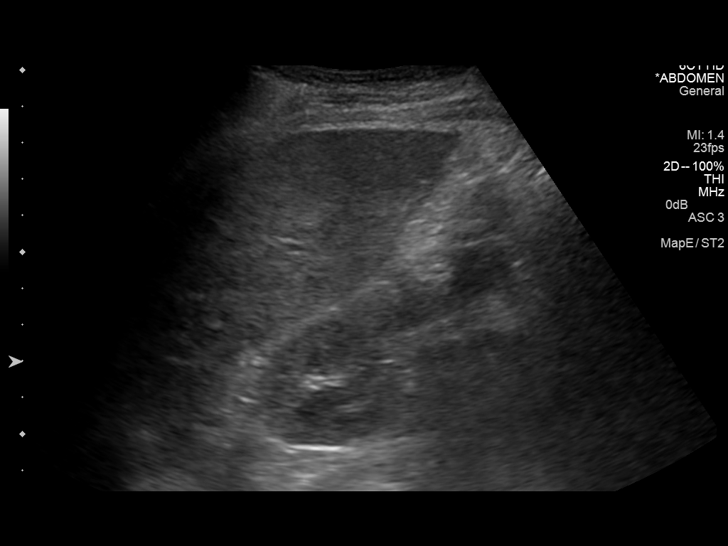
[im 17/36]
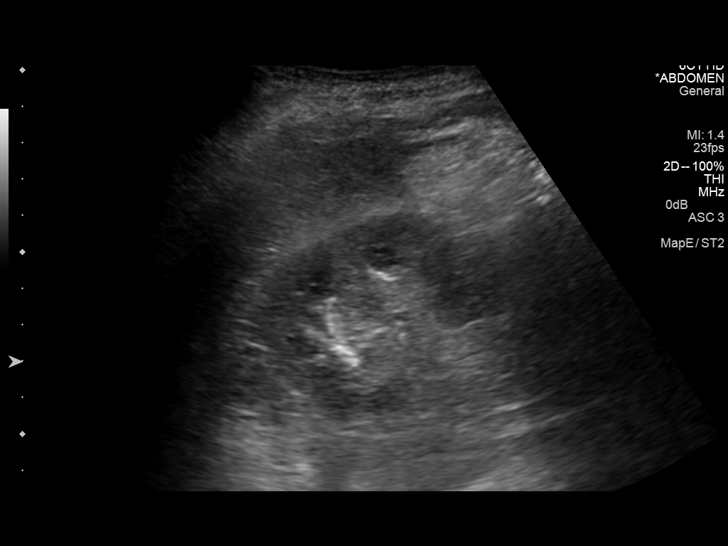
[im 19/36]
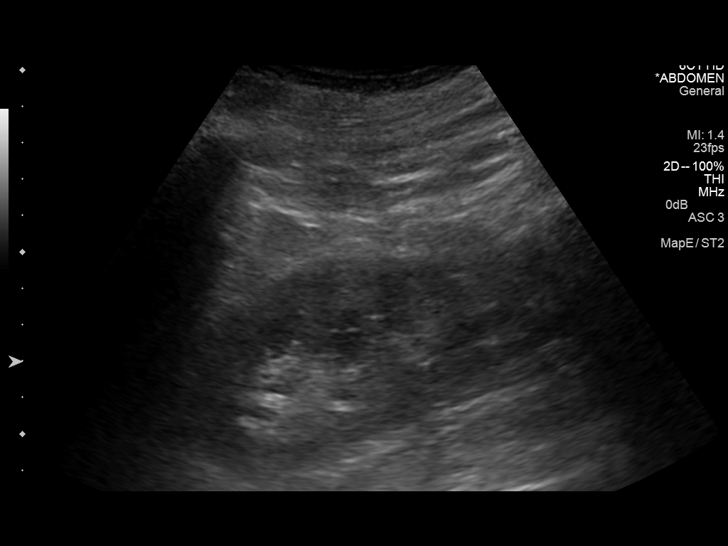
[im 22/36]
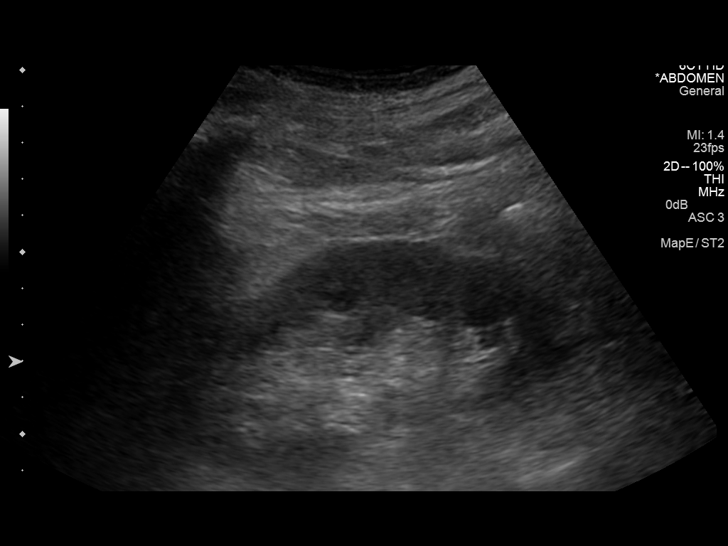
[im 24/36]
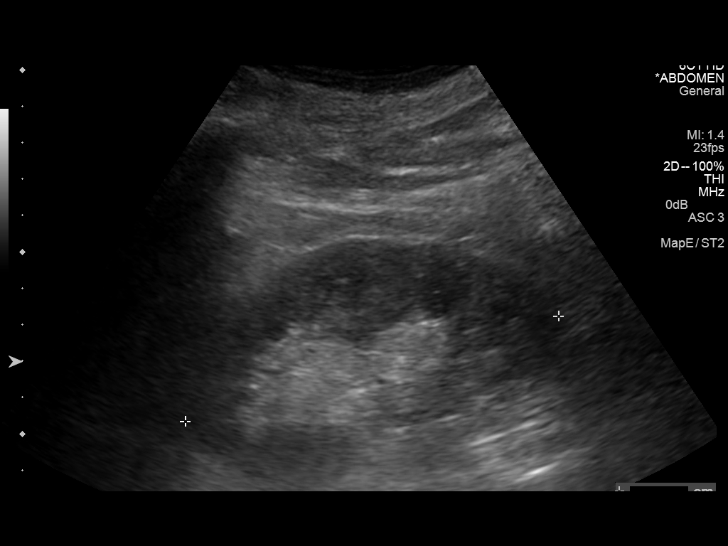
[im 27/36]
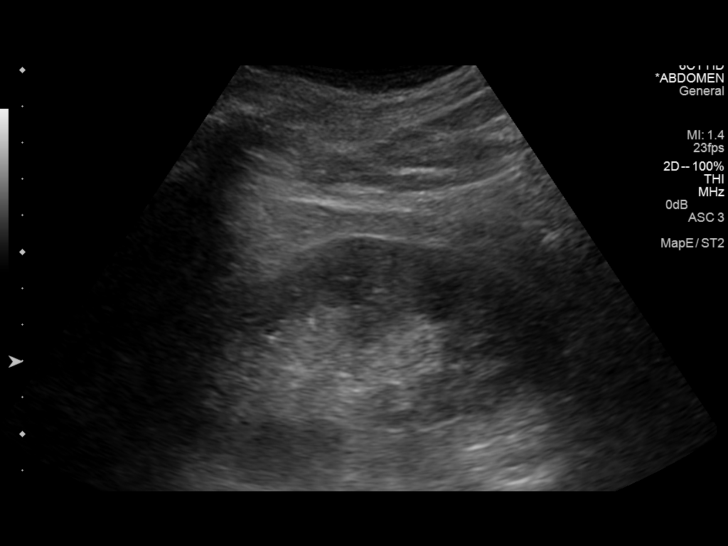
[im 30/36]
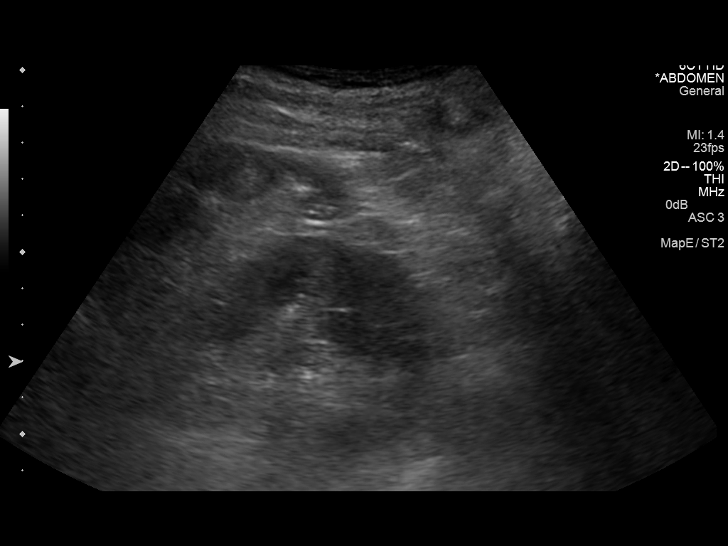
[im 33/36]
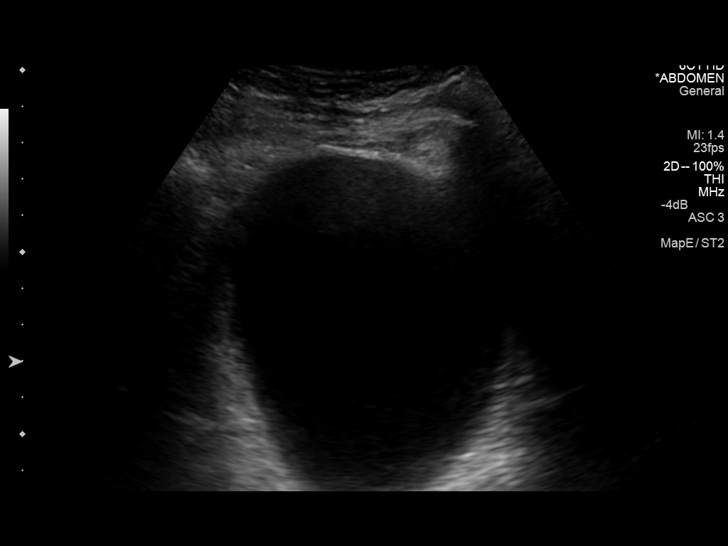
[im 36/36]
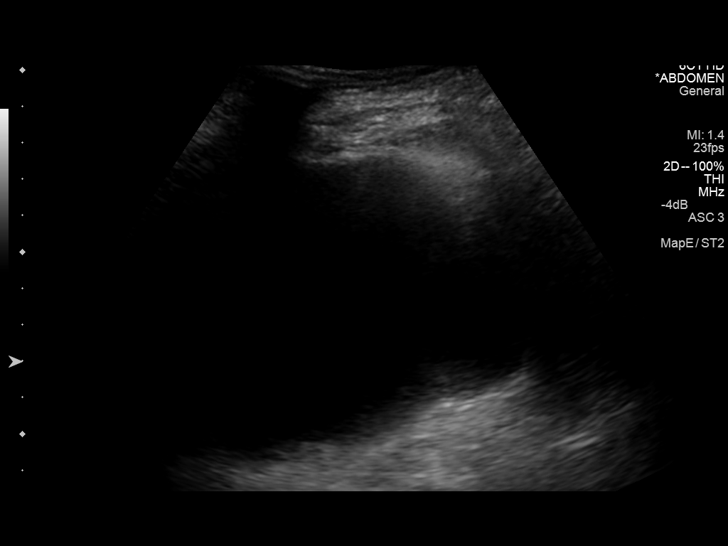

[14 of 25 positions shown; findings below may reference images not displayed]

FINDINGS: Right Kidney:

Length: 11.7 cm. Echogenicity within normal limits. No mass or
hydronephrosis visualized. Mildly prominent echogenicity superiorly
in the sinus fat has no definite correlate on recent CT and is
probably artifactual.

Left Kidney:

Length: 10.7 cm. Echogenicity within normal limits. No mass or
hydronephrosis visualized.

Bladder:

Appears normal for degree of bladder distention.
IMPRESSION: Normal renal ultrasound.  No hydronephrosis.

## 2016-05-03 IMAGING — CR DG CHEST 1V PORT
1 series · 1 of 1 positions shown · non-contrast
Comparison: 01/24/2015

CLINICAL DATA: Shortness of breath and renal failure.

EXAM:
PORTABLE CHEST - 1 VIEW

[AP]
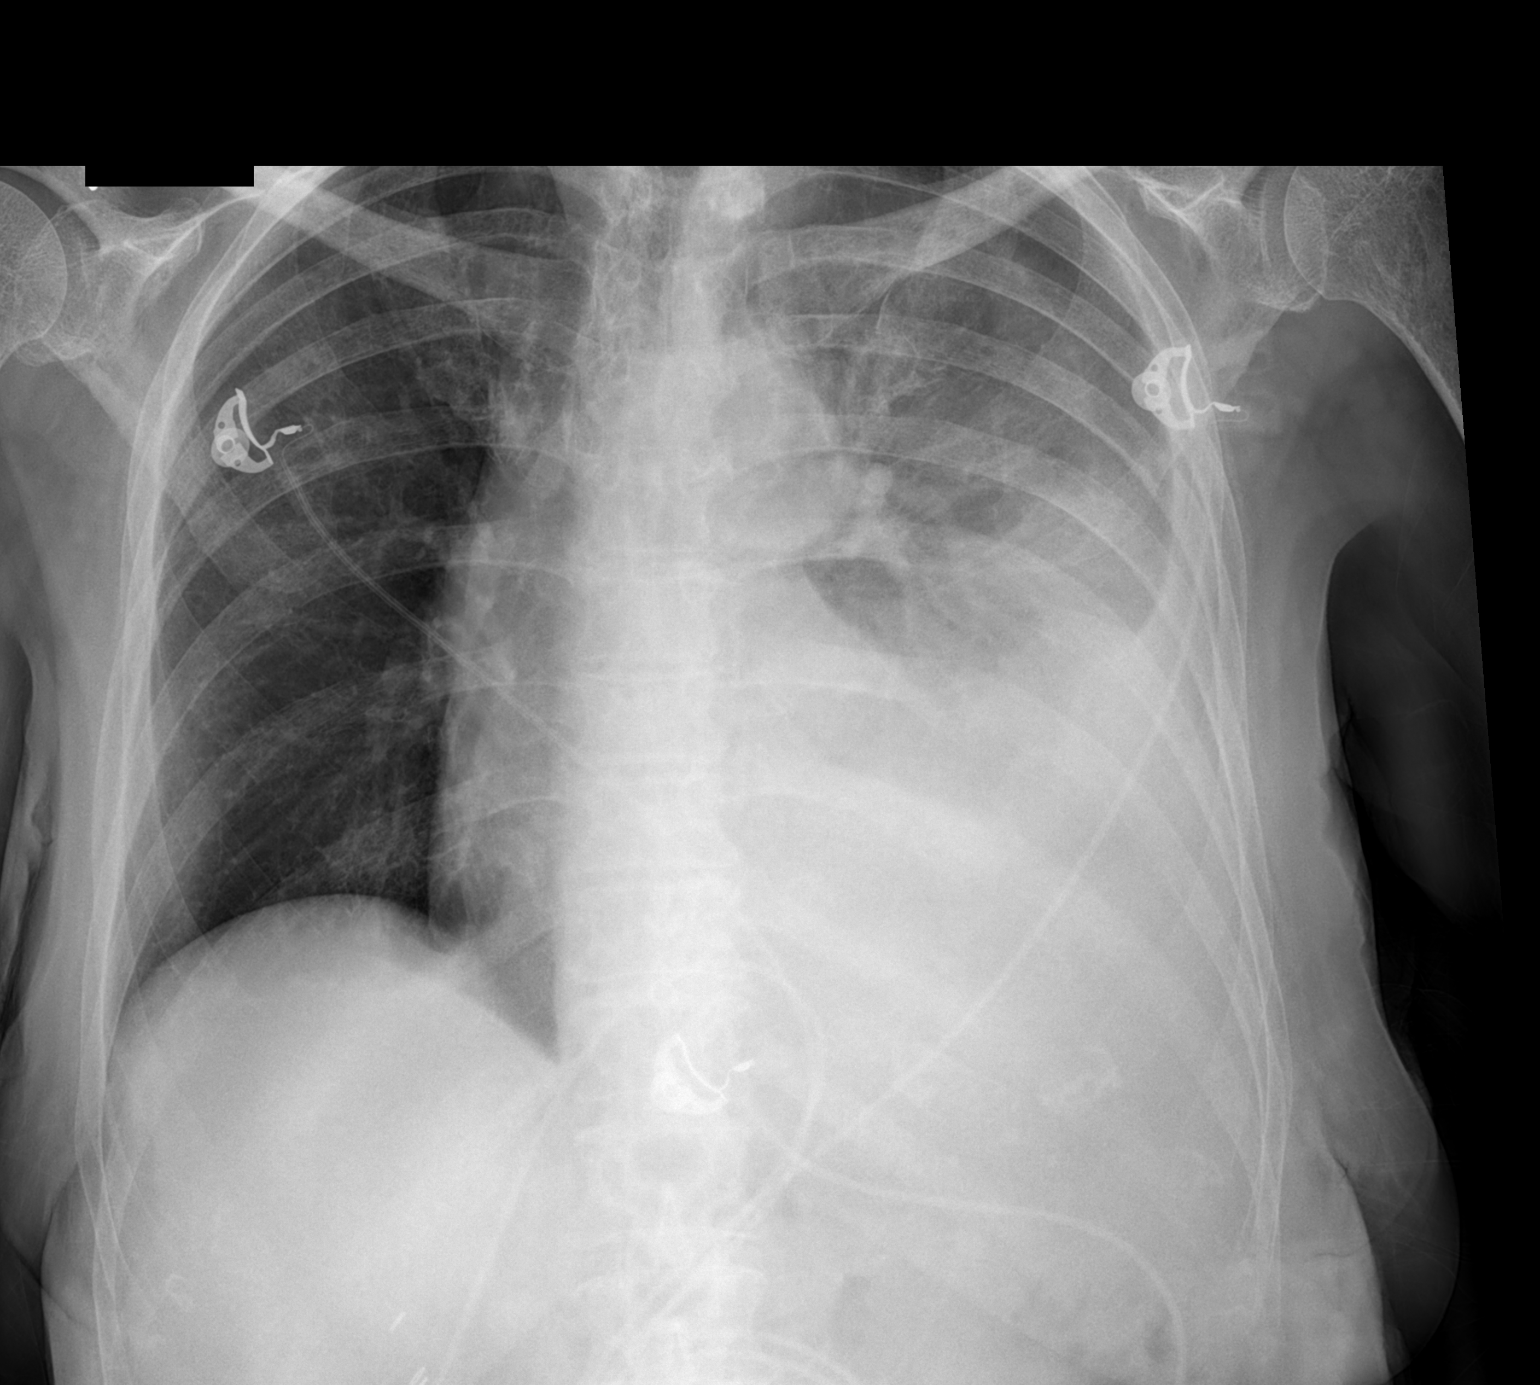

[1 of 1 positions shown; findings below may reference images not displayed]

FINDINGS: Increased densities in the left chest likely represent an enlarging
left pleural effusion. The pleural effusion is moderate to large in
size. Evidence for compressive atelectasis in the left lung. Heart
size is difficult to evaluate due to the left chest densities. The
right lung is clear.
IMPRESSION: Increased densities in the left chest are suggestive for an
enlarging left pleural effusion with compressive atelectasis.
